# Patient Record
Sex: Male | Born: 1955
Health system: Southern US, Community
[De-identification: ages and names within clinical notes are randomized; demographics above are authoritative.]

## PROBLEM LIST (undated history)

## (undated) DIAGNOSIS — I2510 Atherosclerotic heart disease of native coronary artery without angina pectoris: Secondary | ICD-10-CM

## (undated) DIAGNOSIS — C61 Malignant neoplasm of prostate: Secondary | ICD-10-CM

## (undated) DIAGNOSIS — I10 Essential (primary) hypertension: Secondary | ICD-10-CM

## (undated) DIAGNOSIS — E119 Type 2 diabetes mellitus without complications: Secondary | ICD-10-CM

## (undated) DIAGNOSIS — M1990 Unspecified osteoarthritis, unspecified site: Secondary | ICD-10-CM

## (undated) DIAGNOSIS — C9210 Chronic myeloid leukemia, BCR/ABL-positive, not having achieved remission: Secondary | ICD-10-CM

## (undated) DIAGNOSIS — C801 Malignant (primary) neoplasm, unspecified: Secondary | ICD-10-CM

## (undated) DIAGNOSIS — Z7189 Other specified counseling: Secondary | ICD-10-CM

## (undated) DIAGNOSIS — E785 Hyperlipidemia, unspecified: Secondary | ICD-10-CM

## (undated) DIAGNOSIS — N329 Bladder disorder, unspecified: Secondary | ICD-10-CM

## (undated) DIAGNOSIS — K5790 Diverticulosis of intestine, part unspecified, without perforation or abscess without bleeding: Secondary | ICD-10-CM

## (undated) DIAGNOSIS — I219 Acute myocardial infarction, unspecified: Secondary | ICD-10-CM

## (undated) DIAGNOSIS — Z9889 Other specified postprocedural states: Secondary | ICD-10-CM

## (undated) DIAGNOSIS — R569 Unspecified convulsions: Secondary | ICD-10-CM

## (undated) HISTORY — PX: CARDIAC CATHETERIZATION: SHX172

## (undated) HISTORY — DX: Diverticulosis of intestine, part unspecified, without perforation or abscess without bleeding: K57.90

## (undated) HISTORY — DX: Other specified counseling: Z71.89

## (undated) HISTORY — PX: LIGAMENT REPAIR: SHX5444

## (undated) HISTORY — DX: Malignant neoplasm of prostate: C61

## (undated) HISTORY — DX: Hyperlipidemia, unspecified: E78.5

## (undated) HISTORY — DX: Atherosclerotic heart disease of native coronary artery without angina pectoris: I25.10

## (undated) HISTORY — DX: Chronic myeloid leukemia, BCR/ABL-positive, not having achieved remission: C92.10

## (undated) HISTORY — PX: FRACTURE SURGERY: SHX138

## (undated) HISTORY — PX: TONSILLECTOMY: SUR1361

## (undated) HISTORY — PX: TOE SURGERY: SHX1073

## (undated) HISTORY — DX: Other specified postprocedural states: Z98.890

## (undated) HISTORY — PX: FINGER TENDON REPAIR: SHX1640

## (undated) HISTORY — PX: WRIST SURGERY: SHX841

## (undated) HISTORY — PX: CAROTID STENT: SHX1301

---

## 1998-03-28 ENCOUNTER — Ambulatory Visit (HOSPITAL_COMMUNITY): Admission: RE | Admit: 1998-03-28 | Discharge: 1998-03-28 | Payer: Self-pay | Admitting: Pulmonary Disease

## 1998-03-28 ENCOUNTER — Encounter: Payer: Self-pay | Admitting: Pulmonary Disease

## 1998-04-01 ENCOUNTER — Encounter: Payer: Self-pay | Admitting: Pulmonary Disease

## 1998-04-01 ENCOUNTER — Ambulatory Visit (HOSPITAL_COMMUNITY): Admission: RE | Admit: 1998-04-01 | Discharge: 1998-04-01 | Payer: Self-pay | Admitting: Pulmonary Disease

## 1998-05-09 ENCOUNTER — Emergency Department (HOSPITAL_COMMUNITY): Admission: EM | Admit: 1998-05-09 | Discharge: 1998-05-09 | Payer: Self-pay | Admitting: Emergency Medicine

## 1998-05-09 ENCOUNTER — Encounter: Payer: Self-pay | Admitting: Emergency Medicine

## 1999-04-13 ENCOUNTER — Ambulatory Visit (HOSPITAL_COMMUNITY): Admission: RE | Admit: 1999-04-13 | Discharge: 1999-04-13 | Payer: Self-pay | Admitting: Pulmonary Disease

## 1999-04-13 ENCOUNTER — Encounter: Payer: Self-pay | Admitting: Pulmonary Disease

## 2000-06-06 ENCOUNTER — Encounter: Payer: Self-pay | Admitting: Cardiology

## 2000-06-06 ENCOUNTER — Encounter: Payer: Self-pay | Admitting: Emergency Medicine

## 2000-06-06 ENCOUNTER — Inpatient Hospital Stay (HOSPITAL_COMMUNITY): Admission: EM | Admit: 2000-06-06 | Discharge: 2000-06-11 | Payer: Self-pay | Admitting: Emergency Medicine

## 2000-06-10 ENCOUNTER — Encounter: Payer: Self-pay | Admitting: Cardiology

## 2000-06-18 ENCOUNTER — Encounter (HOSPITAL_COMMUNITY): Admission: RE | Admit: 2000-06-18 | Discharge: 2000-09-16 | Payer: Self-pay | Admitting: Cardiology

## 2000-07-02 ENCOUNTER — Inpatient Hospital Stay (HOSPITAL_COMMUNITY): Admission: EM | Admit: 2000-07-02 | Discharge: 2000-07-05 | Payer: Self-pay | Admitting: Emergency Medicine

## 2000-07-03 ENCOUNTER — Encounter: Payer: Self-pay | Admitting: Pulmonary Disease

## 2000-07-15 ENCOUNTER — Inpatient Hospital Stay (HOSPITAL_COMMUNITY): Admission: EM | Admit: 2000-07-15 | Discharge: 2000-07-18 | Payer: Self-pay | Admitting: Emergency Medicine

## 2000-07-16 ENCOUNTER — Encounter: Payer: Self-pay | Admitting: Cardiology

## 2000-08-17 ENCOUNTER — Emergency Department (HOSPITAL_COMMUNITY): Admission: EM | Admit: 2000-08-17 | Discharge: 2000-08-17 | Payer: Self-pay | Admitting: Emergency Medicine

## 2000-09-17 ENCOUNTER — Encounter (HOSPITAL_COMMUNITY): Admission: RE | Admit: 2000-09-17 | Discharge: 2000-12-16 | Payer: Self-pay | Admitting: Cardiology

## 2001-01-10 ENCOUNTER — Encounter: Payer: Self-pay | Admitting: Cardiology

## 2001-01-10 ENCOUNTER — Ambulatory Visit (HOSPITAL_COMMUNITY): Admission: RE | Admit: 2001-01-10 | Discharge: 2001-01-10 | Payer: Self-pay | Admitting: Cardiology

## 2001-01-16 ENCOUNTER — Ambulatory Visit (HOSPITAL_COMMUNITY): Admission: RE | Admit: 2001-01-16 | Discharge: 2001-01-17 | Payer: Self-pay | Admitting: Cardiology

## 2001-01-22 ENCOUNTER — Inpatient Hospital Stay (HOSPITAL_COMMUNITY): Admission: EM | Admit: 2001-01-22 | Discharge: 2001-01-24 | Payer: Self-pay | Admitting: Emergency Medicine

## 2001-01-23 ENCOUNTER — Encounter: Payer: Self-pay | Admitting: Cardiology

## 2001-12-31 ENCOUNTER — Ambulatory Visit (HOSPITAL_COMMUNITY): Admission: RE | Admit: 2001-12-31 | Discharge: 2001-12-31 | Payer: Self-pay | Admitting: Cardiology

## 2001-12-31 ENCOUNTER — Encounter: Payer: Self-pay | Admitting: Cardiology

## 2002-09-30 ENCOUNTER — Ambulatory Visit (HOSPITAL_COMMUNITY): Admission: RE | Admit: 2002-09-30 | Discharge: 2002-09-30 | Payer: Self-pay | Admitting: Cardiology

## 2002-09-30 ENCOUNTER — Encounter: Payer: Self-pay | Admitting: Cardiology

## 2002-10-06 ENCOUNTER — Encounter: Payer: Self-pay | Admitting: Cardiology

## 2002-10-06 ENCOUNTER — Ambulatory Visit (HOSPITAL_COMMUNITY): Admission: RE | Admit: 2002-10-06 | Discharge: 2002-10-07 | Payer: Self-pay | Admitting: Cardiology

## 2003-02-19 ENCOUNTER — Ambulatory Visit (HOSPITAL_BASED_OUTPATIENT_CLINIC_OR_DEPARTMENT_OTHER): Admission: RE | Admit: 2003-02-19 | Discharge: 2003-02-19 | Payer: Self-pay | Admitting: Otolaryngology

## 2003-05-14 ENCOUNTER — Ambulatory Visit (HOSPITAL_BASED_OUTPATIENT_CLINIC_OR_DEPARTMENT_OTHER): Admission: RE | Admit: 2003-05-14 | Discharge: 2003-05-14 | Payer: Self-pay | Admitting: Pulmonary Disease

## 2003-10-13 ENCOUNTER — Emergency Department (HOSPITAL_COMMUNITY): Admission: EM | Admit: 2003-10-13 | Discharge: 2003-10-13 | Payer: Self-pay | Admitting: Emergency Medicine

## 2003-10-20 ENCOUNTER — Ambulatory Visit (HOSPITAL_COMMUNITY): Admission: RE | Admit: 2003-10-20 | Discharge: 2003-10-20 | Payer: Self-pay | Admitting: Internal Medicine

## 2004-08-14 ENCOUNTER — Ambulatory Visit (HOSPITAL_COMMUNITY): Admission: RE | Admit: 2004-08-14 | Discharge: 2004-08-15 | Payer: Self-pay | Admitting: Cardiology

## 2004-09-21 ENCOUNTER — Inpatient Hospital Stay (HOSPITAL_COMMUNITY): Admission: EM | Admit: 2004-09-21 | Discharge: 2004-09-26 | Payer: Self-pay | Admitting: Emergency Medicine

## 2004-12-21 ENCOUNTER — Ambulatory Visit (HOSPITAL_COMMUNITY): Admission: RE | Admit: 2004-12-21 | Discharge: 2004-12-21 | Payer: Self-pay | Admitting: Urology

## 2005-01-01 ENCOUNTER — Ambulatory Visit (HOSPITAL_COMMUNITY): Admission: RE | Admit: 2005-01-01 | Discharge: 2005-01-01 | Payer: Self-pay | Admitting: Urology

## 2005-02-28 ENCOUNTER — Inpatient Hospital Stay (HOSPITAL_COMMUNITY): Admission: RE | Admit: 2005-02-28 | Discharge: 2005-03-02 | Payer: Self-pay | Admitting: Urology

## 2005-02-28 ENCOUNTER — Encounter (INDEPENDENT_AMBULATORY_CARE_PROVIDER_SITE_OTHER): Payer: Self-pay | Admitting: Specialist

## 2007-02-24 ENCOUNTER — Ambulatory Visit (HOSPITAL_BASED_OUTPATIENT_CLINIC_OR_DEPARTMENT_OTHER): Admission: RE | Admit: 2007-02-24 | Discharge: 2007-02-24 | Payer: Self-pay | Admitting: Orthopedic Surgery

## 2007-09-09 ENCOUNTER — Observation Stay (HOSPITAL_COMMUNITY): Admission: EM | Admit: 2007-09-09 | Discharge: 2007-09-09 | Payer: Self-pay | Admitting: Emergency Medicine

## 2007-09-19 ENCOUNTER — Ambulatory Visit (HOSPITAL_COMMUNITY): Admission: RE | Admit: 2007-09-19 | Discharge: 2007-09-19 | Payer: Self-pay | Admitting: Cardiology

## 2007-12-03 ENCOUNTER — Encounter: Admission: RE | Admit: 2007-12-03 | Discharge: 2007-12-03 | Payer: Self-pay | Admitting: Gastroenterology

## 2008-03-03 ENCOUNTER — Ambulatory Visit (HOSPITAL_COMMUNITY): Admission: RE | Admit: 2008-03-03 | Discharge: 2008-03-03 | Payer: Self-pay | Admitting: Surgery

## 2008-07-14 ENCOUNTER — Inpatient Hospital Stay (HOSPITAL_COMMUNITY): Admission: EM | Admit: 2008-07-14 | Discharge: 2008-07-15 | Payer: Self-pay | Admitting: Emergency Medicine

## 2008-07-17 ENCOUNTER — Emergency Department (HOSPITAL_COMMUNITY): Admission: EM | Admit: 2008-07-17 | Discharge: 2008-07-17 | Payer: Self-pay | Admitting: Family Medicine

## 2008-08-25 ENCOUNTER — Inpatient Hospital Stay (HOSPITAL_COMMUNITY): Admission: EM | Admit: 2008-08-25 | Discharge: 2008-08-27 | Payer: Self-pay | Admitting: Emergency Medicine

## 2010-01-24 ENCOUNTER — Emergency Department (HOSPITAL_COMMUNITY): Admission: EM | Admit: 2010-01-24 | Discharge: 2010-01-24 | Payer: Self-pay | Admitting: Family Medicine

## 2010-04-10 DIAGNOSIS — I219 Acute myocardial infarction, unspecified: Secondary | ICD-10-CM

## 2010-04-10 HISTORY — DX: Acute myocardial infarction, unspecified: I21.9

## 2010-04-25 ENCOUNTER — Encounter (HOSPITAL_COMMUNITY): Payer: 59 | Attending: Internal Medicine

## 2010-04-25 DIAGNOSIS — M899 Disorder of bone, unspecified: Secondary | ICD-10-CM | POA: Insufficient documentation

## 2010-06-12 LAB — URINALYSIS, ROUTINE W REFLEX MICROSCOPIC
Glucose, UA: NEGATIVE mg/dL
Ketones, ur: 15 mg/dL — AB
Nitrite: NEGATIVE
Protein, ur: 30 mg/dL — AB
Specific Gravity, Urine: 1.022 (ref 1.005–1.030)
Urobilinogen, UA: 0.2 mg/dL (ref 0.0–1.0)
pH: 5.5 (ref 5.0–8.0)

## 2010-06-12 LAB — DIFFERENTIAL
Basophils Absolute: 0 10*3/uL (ref 0.0–0.1)
Basophils Relative: 0 % (ref 0–1)
Eosinophils Absolute: 0 10*3/uL (ref 0.0–0.7)
Eosinophils Relative: 0 % (ref 0–5)
Lymphocytes Relative: 9 % — ABNORMAL LOW (ref 12–46)
Lymphs Abs: 1.5 10*3/uL (ref 0.7–4.0)
Monocytes Absolute: 1.9 10*3/uL — ABNORMAL HIGH (ref 0.1–1.0)
Monocytes Relative: 11 % (ref 3–12)
Neutro Abs: 13.5 10*3/uL — ABNORMAL HIGH (ref 1.7–7.7)
Neutrophils Relative %: 80 % — ABNORMAL HIGH (ref 43–77)

## 2010-06-12 LAB — COMPREHENSIVE METABOLIC PANEL
ALT: 16 U/L (ref 0–53)
Alkaline Phosphatase: 57 U/L (ref 39–117)
BUN: 8 mg/dL (ref 6–23)
CO2: 29 mEq/L (ref 19–32)
Chloride: 103 mEq/L (ref 96–112)
Glucose, Bld: 113 mg/dL — ABNORMAL HIGH (ref 70–99)
Potassium: 4 mEq/L (ref 3.5–5.1)
Sodium: 137 mEq/L (ref 135–145)
Total Bilirubin: 0.5 mg/dL (ref 0.3–1.2)
Total Protein: 6.1 g/dL (ref 6.0–8.3)

## 2010-06-12 LAB — CULTURE, BLOOD (ROUTINE X 2)
Culture: NO GROWTH
Culture: NO GROWTH

## 2010-06-12 LAB — CBC
HCT: 38 % — ABNORMAL LOW (ref 39.0–52.0)
HCT: 38.1 % — ABNORMAL LOW (ref 39.0–52.0)
HCT: 43.6 % (ref 39.0–52.0)
Hemoglobin: 12.9 g/dL — ABNORMAL LOW (ref 13.0–17.0)
Hemoglobin: 14.7 g/dL (ref 13.0–17.0)
MCHC: 33.8 g/dL (ref 30.0–36.0)
MCV: 94.9 fL (ref 78.0–100.0)
Platelets: 196 10*3/uL (ref 150–400)
Platelets: 224 10*3/uL (ref 150–400)
RBC: 3.99 MIL/uL — ABNORMAL LOW (ref 4.22–5.81)
RBC: 4.6 MIL/uL (ref 4.22–5.81)
RDW: 13.9 % (ref 11.5–15.5)
RDW: 14.1 % (ref 11.5–15.5)
RDW: 14.5 % (ref 11.5–15.5)
WBC: 13.3 10*3/uL — ABNORMAL HIGH (ref 4.0–10.5)
WBC: 16.9 10*3/uL — ABNORMAL HIGH (ref 4.0–10.5)
WBC: 9.1 10*3/uL (ref 4.0–10.5)

## 2010-06-12 LAB — URINE CULTURE
Colony Count: NO GROWTH
Culture: NO GROWTH

## 2010-06-12 LAB — LACTIC ACID, PLASMA: Lactic Acid, Venous: 1.1 mmol/L (ref 0.5–2.2)

## 2010-06-12 LAB — BASIC METABOLIC PANEL
BUN: 11 mg/dL (ref 6–23)
BUN: 12 mg/dL (ref 6–23)
CO2: 25 mEq/L (ref 19–32)
Calcium: 8.4 mg/dL (ref 8.4–10.5)
Calcium: 9 mg/dL (ref 8.4–10.5)
Chloride: 102 mEq/L (ref 96–112)
Creatinine, Ser: 1.14 mg/dL (ref 0.4–1.5)
Creatinine, Ser: 1.25 mg/dL (ref 0.4–1.5)
GFR calc Af Amer: >60 mL/min (ref >60)
GFR calc non Af Amer: >60 mL/min (ref >60)
GFR calc non Af Amer: >60 mL/min (ref >60)
Glucose, Bld: 118 mg/dL — ABNORMAL HIGH (ref 70–99)
Glucose, Bld: 141 mg/dL — ABNORMAL HIGH (ref 70–99)
Potassium: 3.7 mEq/L (ref 3.5–5.1)
Potassium: 3.8 mEq/L (ref 3.5–5.1)
Sodium: 138 mEq/L (ref 135–145)

## 2010-06-12 LAB — HEPATIC FUNCTION PANEL
ALT: 19 U/L (ref 0–53)
AST: 22 U/L (ref 0–37)
Albumin: 3.7 g/dL (ref 3.5–5.2)
Alkaline Phosphatase: 68 U/L (ref 39–117)
Bilirubin, Direct: 0.2 mg/dL (ref 0.0–0.3)
Indirect Bilirubin: 0.6 mg/dL (ref 0.3–0.9)
Total Bilirubin: 0.8 mg/dL (ref 0.3–1.2)
Total Protein: 7.5 g/dL (ref 6.0–8.3)

## 2010-06-12 LAB — URINE MICROSCOPIC-ADD ON

## 2010-06-12 LAB — LIPASE, BLOOD
Lipase: 12 U/L (ref 11–59)
Lipase: 13 U/L (ref 11–59)

## 2010-06-13 LAB — CK TOTAL AND CKMB (NOT AT ARMC)
CK, MB: 1.5 ng/mL (ref 0.3–4.0)
Relative Index: INVALID (ref 0.0–2.5)
Total CK: 91 U/L (ref 7–232)

## 2010-06-13 LAB — COMPREHENSIVE METABOLIC PANEL
BUN: 10 mg/dL (ref 6–23)
CO2: 27 mEq/L (ref 19–32)
Calcium: 8.8 mg/dL (ref 8.4–10.5)
Chloride: 107 mEq/L (ref 96–112)
Creatinine, Ser: 1.03 mg/dL (ref 0.4–1.5)
GFR calc non Af Amer: >60 mL/min (ref >60)
Total Bilirubin: 0.5 mg/dL (ref 0.3–1.2)

## 2010-06-13 LAB — LIPID PANEL
Cholesterol: 125 mg/dL (ref 0–200)
HDL: 29 mg/dL — ABNORMAL LOW (ref >39)
Total CHOL/HDL Ratio: 4.3 RATIO
VLDL: 7 mg/dL (ref 0–40)

## 2010-06-13 LAB — CBC
MCHC: 34.2 g/dL (ref 30.0–36.0)
MCHC: 34.7 g/dL (ref 30.0–36.0)
MCV: 94.4 fL (ref 78.0–100.0)
Platelets: 251 10*3/uL (ref 150–400)
RBC: 4.57 MIL/uL (ref 4.22–5.81)
RBC: 4.8 MIL/uL (ref 4.22–5.81)
WBC: 7.5 10*3/uL (ref 4.0–10.5)
WBC: 8.3 10*3/uL (ref 4.0–10.5)

## 2010-06-13 LAB — BASIC METABOLIC PANEL
BUN: 11 mg/dL (ref 6–23)
CO2: 24 mEq/L (ref 19–32)
Calcium: 8.8 mg/dL (ref 8.4–10.5)
Chloride: 106 mEq/L (ref 96–112)
Creatinine, Ser: 1.07 mg/dL (ref 0.4–1.5)
GFR calc Af Amer: >60 mL/min (ref >60)

## 2010-06-13 LAB — TSH: TSH: 2.017 u[IU]/mL (ref 0.350–4.500)

## 2010-06-13 LAB — TROPONIN I: Troponin I: 0.01 ng/mL (ref 0.00–0.06)

## 2010-06-13 LAB — POCT CARDIAC MARKERS: Myoglobin, poc: 58.3 ng/mL (ref 12–200)

## 2010-06-13 LAB — HEMOGLOBIN A1C: Hgb A1c MFr Bld: 5.7 % (ref 4.6–6.1)

## 2010-06-13 LAB — HEPARIN LEVEL (UNFRACTIONATED)
Heparin Unfractionated: 0.81 IU/mL — ABNORMAL HIGH (ref 0.30–0.70)
Heparin Unfractionated: 0.86 IU/mL — ABNORMAL HIGH (ref 0.30–0.70)

## 2010-06-13 LAB — CARDIAC PANEL(CRET KIN+CKTOT+MB+TROPI): Troponin I: 0.01 ng/mL (ref 0.00–0.06)

## 2010-06-13 LAB — BRAIN NATRIURETIC PEPTIDE: Pro B Natriuretic peptide (BNP): <30 pg/mL (ref 0.0–100.0)

## 2010-07-18 NOTE — H&P (Signed)
NAMEEURAL, HOLZSCHUH             ACCOUNT NO.:  [id number]   MEDICAL RECORD NO.:  [id number]          PATIENT TYPE:  INP   LOCATION:  5127                         FACILITY:  [facility]   PHYSICIAN:  Della Goo, M.D. DATE OF BIRTH:  06/08/1955   DATE OF ADMISSION:  08/24/2008  DATE OF DISCHARGE:                              HISTORY & PHYSICAL   PRIMARY CARE PHYSICIAN:  Dr. Dorothyann Peng.   CHIEF COMPLAINT:  Bloody urine.   HISTORY OF PRESENT ILLNESS:  This is a 55 year old male who presents to  the emergency department with complaints of passing blood in his urine  for the past 4 days along with pain that he describes in his penis along  with difficulty urinating and increased urinary frequency.  The patient  reports having fevers and chills over the past 4 days as well.  He  reports having nausea, denies having vomiting.  Denies having any  diarrhea.  The patient was seen by his primary care physician on June 22  and placed on Levaquin therapy.  The patient was seen in the emergency  department, evaluated, and he was found to have an elevated white blood  cell count of 16.9 with 80% neutrophils, and a urinalysis revealed  moderate leukocyte esterase, moderate hemoglobin and protein.  A CT scan  of the abdomen and pelvis was also performed, which revealed  inflammatory changes of the right kidney.  Increased right perinephric  stranding was seen, also stranding about the head of the pancreas was  also seen.  The patient was started on antibiotic therapy of Rocephin in  the emergency department.   PAST MEDICAL HISTORY:  1. Significant for hypertension.  2. History of prostate cancer, status post TURP in 2006.  3. The patient also has a history of coronary artery disease, status      post PTCA with stents x2.  4. Gastroesophageal reflux disease.   MEDICATIONS:  Include Diovan, aspirin, Enablex, fish oil, hydroxyzine,  Levaquin, Nexium, Niaspan, promethazine, Tetrix, Toprol XL,  vitamin D  and Vytorin.   ALLERGIES:  To IV CONTRAST DYE.   SOCIAL HISTORY:  The patient is a nonsmoker.  He reports rare alcohol  usage.   FAMILY HISTORY:  Positive for diabetes in his mother.  Positive for  coronary artery disease in his paternal grandfather.  Positive for  hypertension in both parents.  Positive for cancer in his paternal  grandfather, who had prostate cancer.   REVIEW OF SYSTEMS:  Pertinents are mentioned above.   PHYSICAL EXAMINATION FINDINGS:  This is a 55 year old well-nourished,  well-developed male who is in no visible discomfort or acute distress.  VITAL SIGNS:  Temperature 99.8, blood pressure 117/83, heart rate 67,  respirations 18.  O2 saturations 97-98%.  HEENT EXAMINATION:  Normocephalic, atraumatic.  There is no scleral  icterus.  Pupils equally round, reactive to light.  Extraocular  movements are intact.  Funduscopic benign.  Nares are patent  bilaterally.  Oropharynx is clear.  NECK:  Supple, full range of motion.  No thyromegaly, adenopathy,  jugular venous distention.  CARDIOVASCULAR:  Regular rate and rhythm.  No  murmurs, gallops or rubs.  LUNGS:  Clear to auscultation bilaterally.  ABDOMEN:  Positive bowel sounds, soft, nontender, nondistended.  No  hepatosplenomegaly.  No costovertebral angle tenderness.  No spinous process tenderness.  NEUROLOGIC EXAMINATION:  The patient is alert and oriented x3.  Cranial  nerves are intact.  Motor and sensory function intact.   LABORATORY STUDIES:  White blood cell count 16.9, hemoglobin 41.7,  hematocrit 43.6, platelets 224, neutrophils 80%, lymphocytes 9%.  Sodium  138, potassium 3.8, chloride 102, carbon dioxide 25, BUN 12, creatinine  1.25, and glucose 118.  Lactic acid level 1.1.  Urinalysis:  Moderate  hemoglobin, moderate leukocyte esterase, 30 mg/dL of protein.  Urine  microscopic:  White blood cells 11-20, urine red blood cell 0-2, rare  bacteria.  Hepatic function panel:  Albumin 3.7, AST  22, ALT 19,  alkaline phosphatase 68, total bilirubin 0.8.  Lipase level 12.  CT scan  of the abdomen and pelvis as mentioned above.   ASSESSMENT:  A 56 year old male being admitted with:  1. Right-sided pyelonephritis.  2. Abdominal pain.  3. Hypertension history.  4. Coronary artery disease history.  5. Prostate cancer history.   PLAN:  The patient will be admitted and placed on IV antibiotic therapy  of Rocephin.  Ciprofloxacin has also been ordered.  The patient has also  been placed on IV fluids for rehydration and maintenance therapy.  Pain  control therapy has been ordered as needed and antipyretic therapy has  also been ordered.  The patient's regular medications will be further  verified.  DVT and GI prophylaxis have been ordered.  Further workup  will ensue pending results of the patient's clinical course.      Della Goo, M.D.  Electronically Signed     HJ/MEDQ  D:  08/25/2008  T:  08/25/2008  Job:  [id number]   cc:   Candyce Churn. Allyne Gee, M.D.

## 2010-07-18 NOTE — Discharge Summary (Signed)
[name], [name]             ACCOUNT NO.:  [id number]   MEDICAL RECORD NO.:  [id number]          PATIENT TYPE:  INP   LOCATION:  2903                         FACILITY:  [facility]   PHYSICIAN:  Ritta Slot, MD     DATE OF BIRTH:  December 11, 1955   DATE OF ADMISSION:  07/14/2008  DATE OF DISCHARGE:  07/15/2008                               DISCHARGE SUMMARY   ADDENDUM   DISCHARGE MEDICATIONS:  1. Vytorin 10/80 every day.  2. Nexium 40 mg b.i.d.  3. Toprol-XL 50 mg every day.  4. Diovan 80 mg every day.  5. Hydroxyzine 25 mg b.i.d.  6. Cialis p.r.n.  7. Aspirin 81 mg b.i.d.  8. Enablex 15 mg every day.  9. Niacin 500 mg every day.  10.Fish oil 1200 mg every.  11.Vitamin D every day.   He will follow up with Dr. Lynnea Ferrier on Jul 30, 2008, at 9:45.      Lezlie Octave, N.P.      Ritta Slot, MD  Electronically Signed    BB/MEDQ  D:  07/15/2008  T:  07/16/2008  Job:  [id number]

## 2010-07-18 NOTE — Op Note (Signed)
NAMEMAXIMILLIAN, HABIBI             ACCOUNT NO.:  [id number]   MEDICAL RECORD NO.:  [id number]          PATIENT TYPE:  AMB   LOCATION:  DSC                          FACILITY:  [facility]   PHYSICIAN:  Feliberto Gottron. Turner Daniels, M.D.   DATE OF BIRTH:  23-Jul-1955   DATE OF PROCEDURE:  02/24/2007  DATE OF DISCHARGE:                               OPERATIVE REPORT   PREOPERATIVE DIAGNOSIS:  Refractory right de Quervain's  stenosing  tenosynovitis.   POSTOPERATIVE DIAGNOSIS:  Refractory right de Quervain's  stenosing  tenosynovitis.   PROCEDURE:  Open right de Quervain's  release.   SURGEON:  Feliberto Gottron.  Turner Daniels, MD.   FIRST ASSISTANT:  None.   ANESTHETIC:  Local with IV sedation.   ESTIMATED BLOOD LOSS:  Minimal.   FLUID REPLACEMENT:  500 mL crystalloid.   TOURNIQUET TIME:  7 minutes.   INDICATIONS FOR PROCEDURE:  A 55 year old gentleman with a history of  sleep apnea and stable heart disease with de Quervain's stenosing  tenosynovitis that has been refractory to splinting, got good temporary  relief with a couple cortisone injections but now has recurrent de  Quervain's and desires open de Quervain's release. The risks and  benefits of surgery were discussed, questions answered.   DESCRIPTION OF PROCEDURE:  The patient identified by armband, taken to  the operating room at [facility], appropriate anesthetic  monitors were attached and IV sedation was induced. Prior to prepping, I  went ahead and sterilely cleansed the skin with alcohol and applied  local anesthetic using 5 mL of a 50/50 mixture of 2% Xylocaine and 0.5%  Marcaine. A tourniquet was then applied to the right upper extremity  which was then prepped and draped in the usual sterile fashion from the  fingertips to the tourniquet.  The limb was then wrapped in Esmarch  bandage, tourniquet inflated to 300 mmHg, began the procedure by making  a 2 cm incision over the first dorsal compartment just through the skin  and of the  subcutaneous tissue.  We then spread it with tenotomy  scissors down to the sheath over the tendons which was incised with a 15  blade, removing a rectangular section of the tendon sheath over the  abductor pollicis longus and extensor pollicis brevis.  This freed up  the tendons completely, they were thoroughly probed to make sure there  were no ganglia.  The wound was then irrigated out with normal saline  solution, the tourniquet was let down, small bleeders were cauterized  with the bipolar. A single layer closure using  4-0 Monocryl suture was then accomplished with a single Steri-Strip and  a dressing of Xeroform 4x4 dressing sponges, Webril and an Ace wrap  applied.  The patient was then awakened and taken to the recovery room  without difficulty.      Feliberto Gottron. Turner Daniels, M.D.  Electronically Signed     FJR/MEDQ  D:  02/24/2007  T:  02/25/2007  Job:  [id number]

## 2010-07-18 NOTE — Discharge Summary (Signed)
NAMEQUINNTON, BURY             ACCOUNT NO.:  [id number]   MEDICAL RECORD NO.:  [id number]          PATIENT TYPE:  INP   LOCATION:  2903                         FACILITY:  [facility]   PHYSICIAN:  Ritta Slot, MD     DATE OF BIRTH:  07-Mar-1955   DATE OF ADMISSION:  07/14/2008  DATE OF DISCHARGE:  07/15/2008                               DISCHARGE SUMMARY   DISCHARGE DIAGNOSES:  1. Chest pain, nonischemic, probable musculoskeletal.  2. Left arm and neck discomfort prior to chest pain.  3. History of coronary artery disease with history of stent to his      left circumflex and stent to his mid right coronary artery, cardiac      catheterization this admission with both stents being patent.  4. Hypertension.  5. Hyperlipidemia.  6. Prostate cancer with history of prostatectomy.   PROCEDURE:  His cardiac catheterization by Dr. Ritta Slot on Jul 15, 2008, showing patent stents, normal LV function.   LABORATORY DATA:  CK-MB and troponins were negative.  Hemoglobin 15.6,  hematocrit 45.5, WBC 7.5, and platelets 253.  Total cholesterol 125,  triglycerides 35, HDL 29, and LDL is 89.  CK-MB and troponins were all  negative.  TSH 2.017, hemoglobin A1c was 5.7.  BNP was less than 30,  magnesium was 2.2.  Chest x-ray showed no active disease.   HOSPITAL COURSE:  Mr. Frangos came into hospital with chest discomfort.  He stated that for the last several days he had been having neck and  left arm pain and then today it came around in his chest and moved over  to the right side.  He said that he considered that this was his heart-  related pain.  He had coronary disease before with 2 stents.  It was  decided he should be admitted, ruled out for an MI, and undergo cardiac  catheterization.  He did have new EKG, lateral T-wave inversions.  The  following day, Jul 15, 2008, he underwent cardiac catheterization.  His  stents were patent.  The procedure was performed by Dr. Ritta Slot.  He  will follow up with Dr. Dr. Lynnea Ferrier in Dr. Truett Perna absence.  His  Toprol was increased at 50 mg every day.      Lezlie Octave, N.P.      Ritta Slot, MD  Electronically Signed    BB/MEDQ  D:  07/15/2008  T:  07/16/2008  Job:  [id number]   cc:   Candyce Churn. Allyne Gee, M.D.

## 2010-07-18 NOTE — Op Note (Signed)
NAME:  [name], [name]             ACCOUNT NO.:  [id number]   MEDICAL RECORD NO.:  [id number]          PATIENT TYPE:  AMB   LOCATION:  SDS                          FACILITY:  [facility]   PHYSICIAN:  Wilmon Arms. Corliss Skains, M.D. DATE OF BIRTH:  January 19, 1956   DATE OF PROCEDURE:  03/03/2008  DATE OF DISCHARGE:  03/03/2008                               OPERATIVE REPORT   PREOPERATIVE DIAGNOSIS:  Right inguinal hernia.   POSTOPERATIVE DIAGNOSIS:  Right inguinal hernia.   PROCEDURE PERFORMED:  Right inguinal hernia repair with mesh.   SURGEON:  Wilmon Arms. Corliss Skains, MD   ANESTHESIA:  General.   INDICATIONS:  The patient is a 55 year old male who presents with the  right groin pain radiating down into his scrotum.  On examination, he  was noted to have an easily palpable right inguinal hernia which was  reducible.  There is no sign of left inguinal hernia.  He presents now  for elective hernia repair.   DESCRIPTION OF PROCEDURE:  The patient was brought to the operating room  and placed in the supine position on the operating room table.  After an  adequate level of general anesthesia was obtained, the patient's right  groin was shaved, prepped with Betadine, and draped in the sterile  fashion.  A time-out was taken to assure the proper patient and proper  procedure.  An incision was made above the right inguinal ligament.  Dissection was carried down through the subcutaneous tissues to the  external oblique fascia.  The fascia was overlying in the direction of  its fibers to the external ring.  We bluntly dissected around the  spermatic cord.  The patient had a very large direct hernia defect.  We  dissected the hernia sac free from the surrounding tissue and reduced  this back in the preperitoneal space.  This was held in place with a  sponge stick.  The floor of the inguinal canal was then imbricated with  a 0 PDS running suture from the pubic tubercle out to the internal ring.  The sponge stick  was removed.  The spermatic cord was skeletonized and a  small indirect cord lipoma was dissected free and reduced.  A 3 x 6  Ultrapro mesh was cut in the keyhole shape and was secured beginning at  the pubic tubercle with 2-0 Prolene.  This was run along the internal  oblique fascia superiorly and the shelving edge inferiorly.  The tails  were sutured together behind the spermatic cord and tucked underneath  the external oblique fascia.  The fascia was reapproximated with 2-0  Vicryl.  The 3-0 Vicryl was used to close the subcutaneous tissues.  The  4-0 Monocryl was used to close skin.  Steri-Strips and clean dressings  were applied.  The patient was then extubated and brought to recovery  room in stable condition.  All sponge, instrument, and needle counts  were correct.      Wilmon Arms. Tsuei, M.D.  Electronically Signed     MKT/MEDQ  D:  03/03/2008  T:  03/04/2008  Job:  [id number]

## 2010-07-18 NOTE — Discharge Summary (Signed)
NAMECECILIO, OHLRICH             ACCOUNT NO.:  [id number]   MEDICAL RECORD NO.:  [id number]          PATIENT TYPE:  INP   LOCATION:  5157                         FACILITY:  [facility]   PHYSICIAN:  Monte Fantasia, MD  DATE OF BIRTH:  May 21, 1955   DATE OF ADMISSION:  08/24/2008  DATE OF DISCHARGE:  08/27/2008                               DISCHARGE SUMMARY   PRIMARY CARE PHYSICIAN:  Robyn N. Allyne Gee, MD   DISCHARGE DIAGNOSES:  1. Pyelonephritis.  2. Hypertension.  3. Coronary artery disease.  4. History of prostate cancer.   MEDICATIONS UPON DISCHARGE:  1. Levaquin 500 mg p.o. daily for 11 days.  2. Nexium 2 tablets p.o. daily 40 mg each.  3. Hydroxyzine 25 mg p.o. b.i.d.  4. Diovan 80 mg p.o. daily.  5. Toprol-XL 50 mg p.o. daily.  6. Aspirin 81 mg p.o. daily.  7. Vytorin 10/80 one tablet p.o. daily.  8. Enablex 15 mg p.o. daily.  9. Fish oil 1200 mg p.o. daily.  10.Vitamin D3 p.o. q.i.d.   COURSE DURING THE HOSPITAL STAY:  Mr. Chewning is a 55 year old African  American male patient, who was admitted on August 25, 2008, with  complaints of back pain with passing blood in his urine.  The pain in  the back was radiating down to his groin and had difficulty urinating  with increased frequency.  He also reported fevers and chills for past 4  days.  The patient was just prescribed Levaquin by his primary care  physician, however, came in because of his severe back pain to the ER.  The patient was, on admission, started on Rocephin and ciprofloxacin on  admission.  Blood and urine culture was sent on admission prior to  starting the antibiotics.  The patient did have on admission,  leukocytosis of 16.9.   For the past 48 hours, the patient has improved well through his stay in  the hospital, has been afebrile through the stay.  Urinary symptoms have  resolved and the back pain has resolved.  The patient also had CT scan  of the abdomen and pelvis on admission, which showed no  hydronephrosis,  tiny calculi in the collecting right kidney, inflammatory changes  involving the right kidney and pyelonephritis cannot be excluded.  CT  pelvis and bladder was unremarkable and prostate was not visualized.  No  intrapelvic pathology was noted.  At present, blood cultures and urine  cultures have been no growth to date, and the patient has been afebrile  through the stay.  Leukocyte has resolved to 9.0.  At present, the  patient is medically stable to be discharged and on Levaquin for 11 more  days to complete the course of 2 weeks.   RADIOLOGICAL INVESTIGATIONS DONE DURING THE STAY IN THE HOSPITAL:  CT  abdomen without contrast.  Impression:  Early pancreatitis inflammatory  changes involving the right kidney, pyelonephritis cannot be excluded.  CT pelvis.  Impression:  No acute intrapelvic pathology.   LABORATORIES DONE DURING THE STAY IN THE HOSPITAL:  Total WBC 9.1  improved from 16.9, hemoglobin 12.9, hematocrit 38.1, platelets of 189.  Sodium 137, potassium 4.0, chloride 103, bicarb 29, glucose 113, BUN 8,  creatinine 1.08.  Total bilirubin 0.5, alkaline phosphatase 57, AST 18,  ALT 16.  Total protein 6.1, albumin 2.8, calcium of 8.9, lipase 13,  lactic acid 1.1.  UA:  Nitrite negative, leukocytes moderate, wbc's 11-  20, bacteria rare.  Blood cultures x2 sets have been negative to no  growth to date.  Urine cultures have been no growth both done on August 24, 2008.   DISPOSITION:  The patient at present is medically stable to be  discharged and can be discharged home.  He has recommended to take  Levaquin to complete a course of 14 days for 11 more days.  The patient  has also recommended to follow up with his primary care physician, Dr.  Allyne Gee.   TOTAL TIME FOR DICTATION:  35 minutes.      Monte Fantasia, MD  Electronically Signed     MP/MEDQ  D:  08/27/2008  T:  08/28/2008  Job:  [id number]

## 2010-07-21 NOTE — Discharge Summary (Signed)
NAMECEPHUS, [name]             ACCOUNT NO.:  [id number]   MEDICAL RECORD NO.:  [id number]          PATIENT TYPE:  OIB   LOCATION:  6522                         FACILITY:  [facility]   PHYSICIAN:  Richard A. Alanda Amass, M.D.DATE OF BIRTH:  [birth year]   DATE OF ADMISSION:  08/14/2004  DATE OF DISCHARGE:  08/15/2004                                 DISCHARGE SUMMARY   DISCHARGE DIAGNOSES:  1.  Chest pain consistent with angina.  2.  Coronary disease, right coronary artery TAXUS stenting, this admission.  3.  History of prior circumflex intervention in 2002.  4.  Hypertension.  5.  Dyslipidemia, Vytorin was increased this admission.  6.  History of intravenous dye allergy.  7.  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   HOSPITAL COURSE:  Mr. [name] is a 55 year old male followed by Dr. Dorothyann Peng and Dr. Elsie Lincoln with a history of coronary disease.  He had prior  circumflex OM stenting.  He was catheterized in June of 2005 and this site  was patent.  He was seen in the office on Jul 27, 2004.  He says that he had  developed some dyspnea and exertional chest pain.  It was worrisome for  angina.  The patient has a history of false-positive Cardiolite studies in  the past.  Dr. Elsie Lincoln [facility] and this revealed a 75% RCA stenosis.  The  previous OM stent was patent and the LAD was patent.  His EF was 50%.  He  was started on Plavix and set up for an elective intervention.  The patient  was admitted August 14, 2004 for elective RCA intervention; this was done by  Dr. Elsie Lincoln.  The patient received a TAXUS stent.  He tolerated this well.  We feel he can be discharged August 15, 2004.  He had been on chronic  diclofenac b.i.d. for over a month.  Dr. Alanda Amass suggested he stop this  and take it on a p.r.n. basis.  We did increase his Vytorin to 10/80, as his  LDL was over 100.  He will follow up with Dr. Elsie Lincoln as an outpatient.   DISCHARGE  MEDICATIONS:  1.  Diovan 80 mg a day.  2.  Protonix 40 mg a day.  3.  Vytorin 10/80 daily.  4.  Aspirin 81 mg a day.  5.  Plavix 75 mg a day.  6.  Toprol-XL 25 mg a day.  7.  Hydroxyzine 25 mg twice a day.  8.  Nitroglycerin sublingual p.r.n.   LABORATORY DATA:  White count 15.4, hemoglobin 14.8, hematocrit 43.2,  platelets 338,000.  Sodium 139, potassium 4.0, BUN 15, creatinine 1.2.  CK  and troponin are negative x1.  Cholesterol is 161 with an LDL of 103, HDL of  47.   Chest x-ray is negative.   INR 1.0.  UA is unremarkable.   EKG shows sinus rhythm without acute changes.   DISPOSITION:  The patient is discharged in stable condition.   FOLLOWUP:  He will follow up with Dr. Elsie Lincoln in a couple  of weeks.   COMMENT:  It should be noted that the patient did receive IV contrast,  steroids and Pepcid prior to his intervention.       LKK/MEDQ  D:  08/15/2004  T:  08/15/2004  Job:  [id number]   cc:   Candyce Churn. Allyne Gee, M.D.  [address]  [address]  [place]  [place] [address]  Fax: [phone]

## 2010-07-21 NOTE — Discharge Summary (Signed)
[name], [name]             ACCOUNT NO.:  [id number]   MEDICAL RECORD NO.:  [id number]          PATIENT TYPE:  INP   LOCATION:  2852                         FACILITY:  [facility]   PHYSICIAN:  Madaline Savage, M.D.DATE OF BIRTH:  Jun 28, 1955   DATE OF ADMISSION:  09/09/2007  DATE OF DISCHARGE:  09/09/2007                               DISCHARGE SUMMARY   DISCHARGE DIAGNOSES:  1. Chest pain, negative myocardial infarction.  2. Coronary disease with patent stent to the circumflex, stable      coronary anatomy, noncardiac source of chest pain.  3. Hypertension.  4. Hyperlipidemia.  5. History of prostatectomy secondary to prostate cancer.   PROCEDURES:  On September 09, 2007, combined left heart catheterization by Dr.  Madaline Savage.   The patient was admitted on September 09, 2007, originally as an inpatient for  unstable angina but he went from the ER to the cath lab and was found to  have stable coronary arteries.  Therefore he did not need to be admitted  and he was discharged that day from short stay.   DISCHARGE MEDICATIONS:  His discharge meds were the same as his  inpatient meds which included,  1. Enablex 15 mg daily.  2. Fish oil 1200 daily.  3. Nexium 40 daily.  4. Hydroxyzine 25 daily.  5. Vitamin D 1000 mg daily.  6. Diovan 80 daily.  7. Toprol-XL 25 daily.  8. Aspirin 81 two daily.  9. Vytorin 10/80 daily.  10.Cream to hands as before.   The patient will follow up with Dr. Elsie Lincoln.   LABORATORY VALUES:  Hemoglobin 14, hematocrit 42, WBC 9.4, platelets  291, neutrophils 66, lymphs 26,  monos 7, eos 1, basos 1.  Protime 13.3.  INR of 1 and PTT 29.  Chemistry was all within normal limits and cardiac  enzymes were entirely normal.  TSH 1.296.   EKG sinus rhythm, nonspecific T-wave changes.  Chest x-ray without  active disease.      Darcella Gasman. Annie Paras, N.P.    ______________________________  Madaline Savage, M.D.    LRI/MEDQ  D:  10/14/2007  T:  10/15/2007   Job:  [id number]   cc:   Madaline Savage, M.D.

## 2010-07-21 NOTE — Discharge Summary (Signed)
[name]. [facility]  Patient:    [name] Visit Number: [id number] MRN: [id number]          Service Type: CAT Location: 3700 [id number] [id number] Attending Physician:  Robynn Pane Dictated by:   Eduardo Osier Sharyn Lull, M.D. Admit Date:  01/16/2001 Discharge Date: 01/17/2001   CC:         [name]., M.D.   Discharge Summary  ADMISSION DIAGNOSES: 1. Accelerated angina, positive stress Cardiolite, rule out restenosis. 2. Coronary artery disease, status post posterolateral wall myocardial    infarction in April of 2002. 3. Hypercholesterolemia. 4. Positive family history of coronary artery disease. 5. History of tobacco abuse.  FINAL DIAGNOSES: 1. Accelerated angina, positive stress Cardiolite, status post percutaneous    transluminal coronary angioplasty to obtuse marginal 1. 2. Coronary artery disease, status post posterolateral wall myocardial    infarction in April 2002. 3. Hypercholesterolemia. 4. History of tobacco abuse. 5. Positive family history of coronary artery disease. 6. History of diverticulosis.  DISCHARGE HOME MEDICATIONS: 1. Toprol XL 50 mg one tablet daily. 2. Altace 2.5 mg one capsule daily. 3. Enteric coated aspirin 325 mg one tablet daily. 4. Plavix 75 mg one tablet daily with food. 5. Lipitor 20 mg one tablet daily. 6. Nitrostat 0.4 mg sublingual, use as directed.  ACTIVITY:  As tolerated.  DIET:  Low salt, low cholesterol diet.  DISCHARGE INSTRUCTIONS:  Post angioplasty and stent instructions have been given.  FOLLOW-UP:  He will follow up with me in one week and primary M.D. in two weeks.  CONDITION ON DISCHARGE:  Stable.  BRIEF HISTORY AND HOSPITAL COURSE:  Mr. [name] is a 55 year old black male with past medical history significant for coronary artery disease, status post posterolateral wall MI and status post PTCA and stenting to OM1 in April of 2002 and PTCA and stenting to OM1 in May of  2002, hypercholesterolemia, tobacco abuse, positive family history of coronary artery disease, and history of diverticulosis, who came to the office complaining of recurrent retrosternal chest pain off and on associated with minimal exertion or with stress.  It was relieved with one to two sublingual nitroglycerin.  Chest pain was similar in nature when he had an MI.  He denies any nausea, vomiting, diaphoresis, denies PND, orthopnea or leg swelling.  The patient underwent stress Cardiolite on January 10, 2001, which showed recurrent inferolateral wall ischemia with ejection fraction of 52%.  PAST MEDICAL HISTORY:  As above.  PAST SURGICAL HISTORY:  He had right wrist ganglion surgery x 2 many years ago.  He had right foot surgery many years ago.  ALLERGIES:  No known drug allergies.  MEDICATIONS AT HOME: 1. Toprol XL 50 mg p.o. q.d. 2. Altace 2.5 mg p.o. q.d. 3. Aspirin 325 mg p.o. q.d. 4. Plavix 75 mg p.o. q.d. 5. Lipitor 20 mg p.o. q.d. 6. Nitrostat 0.4 mg sublingual use as directed.  SOCIAL HISTORY:  He is married.  He works for [employer].  He was born in [place], [place].  [place].  He has smoked one pack per day for 30+ years.  No history of alcohol abuse.  FAMILY HISTORY:  Father is alive and in good health.  Mother is diabetic.  He has two uncles who died of MI in their 16s.  PHYSICAL EXAMINATION:  GENERAL APPEARANCE:  He was awake, alert and oriented x 3, in no acute distress.  VITAL SIGNS:  Blood pressure 114/78,  pulse 68 and regular.  HEENT:  Conjunctiva pink.  NECK:  Supple.  No JVD, no bruit.  LUNGS:  Clear to auscultation without rhonchi or rales.  CARDIOVASCULAR:  S1 and S2 normal, there was no S3 gallop.  ABDOMEN:  Soft, bowel sounds present.  Abdomen was nontender.  EXTREMITIES:  There was no clubbing, cyanosis, or edema.  EKG showed normal sinus rhythm and no T-wave inversion.  Minor ST depression in  inferior leads and old posterior wall MI.  LABS:  Precath labs showed sodium 140, potassium 4.0, chloride 102, bicarb 24, glucose was 138 which was nonfasting.  His hemoglobin was 16.1, hematocrit 46.3, BUN 15, creatinine 1.3.  BRIEF HOSPITAL COURSE:  The patient was an a.m. admit and underwent left cardiac catheterization and selective left and right coronary angiography and PTCA to OM1 as per cath report.  The patient tolerated the procedure well. There were no complications.  Sheaths were pulled out the same day as hospital procedure.  The patient has been ambulating in the hallway without any problem.   There is no evidence of hematoma or bruit in the groin.  Post PTCA, his CPKs are normal.  His hemoglobin and hematocrit are stable.  His BUN and creatinine are stable.  The patient did not have any further episodes of chest pain during the hospital stay. The patient will be discharged home on the above medications and will be followed up in my office in one week and with [name]., M.D., in two weeks. Dictated by:   Eduardo Osier Sharyn Lull, M.D. Attending Physician:  Robynn Pane DD:  01/17/01 TD:  01/17/01 Job: [id number]

## 2010-07-21 NOTE — H&P (Signed)
[place], [name]             ACCOUNT NO.:  [id number]   MEDICAL RECORD NO.:  [id number]          PATIENT TYPE:  INP   LOCATION:  1824                         FACILITY:  [facility]   PHYSICIAN:  Lonia Blood, M.D.      DATE OF BIRTH:  04-Mar-1956   DATE OF ADMISSION:  09/21/2004  DATE OF DISCHARGE:                                HISTORY & PHYSICAL   PRIMARY CARE PHYSICIAN:  Dr. Dorothyann Peng.   PRESENTING COMPLAINT:  Lower abdominal pressure and dysuria for three days.   HISTORY OF PRESENT ILLNESS:  Patient is a 55 year old African-American male  with history of coronary artery disease status post multiple PCIs, last one  performed on August 14, 2004.  Patient has been fairly active and doing well  until Monday when he was unable to void.  Per patient he felt lots of  pressure and tried going to the bathroom and could not pass both stool and  urine.  By Tuesday he was able to pass urine.  However, he was getting more  frequency and gradually having pain when passing the urine.  Yesterday he  had been on a fever in addition to his frequency.  He decided then to come  to the emergency room today.  Patient described his abdominal pain as mainly  pressure in the lower part associated with his symptoms.  He still has not  been able to pass any stool despite taking some stool softeners.  Denied any  chest pain, nausea, vomiting.  Patient did complain of some pain in the back  associated with his symptoms.   PAST MEDICAL HISTORY:  1.  Coronary artery disease.  2.  Status post multiple PCIs last on August 14, 2004.  3.  History of hypertension now controlled.  4.  Dyslipidemia.  5.  GERD.  6.  Seasonal allergies.  7.  Remote history of tobacco abuse.  8.  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   Patient has IV drug dye allergy.   ALLERGIES:  IVP DYE.   MEDICATIONS:  1.  Plavix 75 mg daily.  2.  Imdur 60 mg daily.  3.  Protonix 40 mg daily.  4.  Hydroxyzine 25 mg b.i.d.  5.  Toprol 25 mg  daily.  6.  Aspirin 162 mg daily.  7.  Allegra 180 mg p.r.n.  8.  Vytorin 10/80 one tablet daily.  9.  Diovan 80 mg a day.   SOCIAL HISTORY:  Patient works for [employer].  Usually works from  home.  He quit smoking several years ago.  Denied any IV drug use.  He  occasionally drinks alcohol socially.  Patient is married.   FAMILY HISTORY:  Significant for coronary artery disease.  Two of his uncles  died of MI in their 59s.  Mother is also diabetic.   REVIEW OF SYSTEMS:  10-point review of systems essentially as in HPI.  Patient denied any other symptoms except those mentioned in HPI.   PHYSICAL EXAMINATION:  VITAL SIGNS:  Temperature 99.2, blood pressure  121/84, pulse 125, respiratory rate 18, saturations 97% on room air.  GENERAL:  Patient is awake, alert, oriented, in just mild distress due to  the pain below.  HEENT:  PERRL.  EOMI.  NECK:  Supple.  No JVD.  No lymphadenopathy.  RESPIRATORY:  Good air entry bilaterally.  No wheezes or rales.  CARDIOVASCULAR:  He is mildly tachycardic.  ABDOMEN:  Full, tympanitic.  Mild discomfort in lower abdomen.  EXTREMITIES:  No edema, cyanosis, or clubbing.   LABORATORIES:  Cloudy urine with moderate hemoglobin, some ketones,  proteinuria of 30, large leukocyte esterase, but nitrite negative, wbc's  were too numerous to count, rbc's 3-6, and few bacteria.  Sodium 137,  potassium 3.6, chloride 105, CO2 23, glucose 127, BUN 12, creatinine 1.5,  calcium 9, total protein 6.9, albumin 4.1, AST 31, ALT 32, alkaline  phosphatase 78, total bilirubin 1.3.  His white count is 20.4, hemoglobin  14.7, platelet count 271 with a left shift, ANC of 17.4.  His EKG showed  some sinus tachycardia with a rate of 112.  He has regular intervals,  possibly LVH.  There is T-wave inversion in the lateral leads, but no  significant change from the previous except for the tachycardia.   ASSESSMENT:  1.  This is a 55 year old African-American male  presenting with what appears      to be pyelonephritis.  Patient is young and it is not clear where the      source of his infection is.  It is clear that the nitrite is negative      indicating that this is probably not a coliform.  Patient had very much      protein in urine.  He denied any prior urinary symptoms.  No evidence of      prostatitis at this point and no history of BPH.  With a white count      high and with the fever and the urinary findings, CVA tenderness was not      elicited.  However, everything points toward possible pyelonephritis.      Patient is not vomiting at the moment but due to his multiple medical      problems we will need to admit him for at least 23-hour observation,      start him on intravenous antibiotics.  Once he is much more stable and      strong will convert him to p.o. ciprofloxacin prior to discharge.  2.  Will also check urine culture and sensitivity to identify the organism      specifically.  Will also hydrate the patient gently while taking blood      cultures as well.  At this point I will choose Cipro plus or minus      augmentation with Rocephin one-time dose up until we get any urine      cultures back and if we need to change the antibiotics, we will do it at      that time.  3.  Coronary artery disease.  Seems to be stable in patient.  No chest pain      and no reason to suggest cardiac involvement at this point.  Will      therefore proceed conservatively with regard to the heart.  4.  Hypertension.  This seems to be well controlled as well so I will make      no changes at this point.  5.  Dyslipidemia.  I will continue his current dose of Vytorin without any      change.  6.  Allergies.  Patient is currently not having any symptoms.  I will      therefore hold the Allegra that he is taking.  7.  Constipation.  I will put patient on scheduled laxatives secondary to     his lack of bowel movements so far.  If possible, I will use  magnesium      citrate to stimulate his bowels initially.  8.  Gastroesophageal reflux disease.  I will continue with his Protonix      while in the hospital.      Lonia Blood, M.D.  Electronically Signed     LG/MEDQ  D:  09/21/2004  T:  09/21/2004  Job:  [id number]   cc:   Candyce Churn. Allyne Gee, M.D.  [address]  [address]  [place]  [place] [address]  Fax: [phone]

## 2010-07-21 NOTE — Discharge Summary (Signed)
[place]. [facility]  Patient:    [name], [name] Visit Number: [id number] MRN: [id number]          Service Type: MED Location: 2000 2015 01 Attending Physician:  Robynn Pane Dictated by:   Eduardo Osier Sharyn Lull, M.D. Admit Date:  01/22/2001 Discharge Date: 01/24/2001                             Discharge Summary  ADMISSION DIAGNOSES: 1. Accelerated angina, rule out restenosis. 2. Coronary artery disease. 3. History of posterolateral wall myocardial infarction in the past. 4. Status post recent percutaneous transluminal coronary angioplasty to ostia    of first obtuse marginal. 5. Hypertension. 6. Hypercholesterolemia. 7. History of tobacco use. 8. Positive family history of coronary artery disease.  DISCHARGE DIAGNOSES:  1. Stable angina.  2. Mildly positive stress Cardiolite.  3. Coronary artery disease.  4. History of posterolateral wall myocardial infarction.  5. Status post percutaneous transluminal coronary angioplasty and stenting     x 2 to first obtuse marginal.  6. Status post percutaneous transluminal coronary angioplasty to first obtuse     marginal approximately one week ago.  7. Hypertension.  8. Hypercholesterolemia.  9. Tobacco abuse. 10. Positive family history of coronary artery disease.  DISCHARGE MEDICATIONS: 1. Toprol XL 25 mg one tablet daily. 2. Altace 2.5 mg one capsule daily. 3. Nitro-Dur 0.2 mg/hr applied to chest wall in the a.m. and off at night. 4. Enteric-coated aspirin 325 mg one tablet daily. 5. Plavix 75 mg one tablet daily with food. 6. Nitrostat 0.4 mg sublingual to use as directed. 7. Lipitor 200 mg one tablet daily. 8. Wellbutrin one tablet daily as before.  ACTIVITY:  Avoid heavy exertion, lifting, pushing, or pulling.  DIET:  Low salt, low cholesterol.  FOLLOW-UP:  Follow up with me in 7-10 days.  CONDITION ON DISCHARGE:  Stable.  BRIEF HISTORY:  [name] is a 55 year old black male with a  past medical history significant for posterolateral wall MI, status post PTCA and stenting to OM1 x 2 in April and May of 2002 and then PTCA to OM1 approximately one week ago using a cutting balloon.  History of hypertension, hypercholesterolemia, and tobacco abuse.  History of diverticulosis.  He was recently discharged from the hospital.  He came to the ER complaining of retrosternal chest pain radiating across the chest, grade 6/10.  He took three sublingual nitroglycerin at home with partial relief.  He decided to come to the ER.  The patient denies any nausea, vomiting, diaphoresis, and shortness of breath.  The chest pain felt similar in nature when he had MI.  Since hospital discharge, he had mild chest pain on Saturday and Sunday, but did not require any nitroglycerin.  He states that he is under a lot of stress at work and last night had an argument with his wife and could not sleep.  He slept two to three hours.  Presently he denies any chest pain or pressure.  He denies a history of rest or exertional angina.  PAST MEDICAL HISTORY:  As above.  PAST SURGICAL HISTORY:  He had right wrist surgery many years ago and right foot surgery many years ago.  ALLERGIES:  No known drug allergies.  MEDICATIONS: 1. Toprol XL 50 mg p.o. q.d. 2. Altace 2.5 mg p.o. q.d. 3. Enteric-coated aspirin 325 mg p.o. q.d. 4. Plavix 75 mg p.o. q.d. 5. Nitrostat sublingual p.r.n. 6.  Lipitor 20 mg p.o. q.d. 7. Wellbutrin one p.o. q.d.  SOCIAL HISTORY:  He is married.  He works for [employer].  He was born in [place], [place].  He [place] in [place], [place].  He smoked one pack per day for 30 years.  No history of alcohol abuse.  FAMILY HISTORY:  Positive for coronary artery disease.  Two uncles died of MI in their 38s.  His mother is diabetic.  His father is in good health.  PHYSICAL EXAMINATION:  He was alert, awake, and oriented x 3 and in no acute distress.  VITAL  SIGNS:  The blood pressure was 106/70.  The pulse was 64.  HEENT:  The conjunctivae are pink.  NECK:  Supple.  No JVD.  No bruit.  LUNGS:  Clear to auscultation without rhonchi or rales.  CARDIAC:  S1 and S2 were normal.  There was no S3 gallop.  ABDOMEN:  Soft.  Bowel sounds are present.  Nontender.  EXTREMITIES:  There was no clubbing, cyanosis, or edema.  The right groin showed no evidence of hematoma or bruit.  LABORATORY DATA:  Two sets of CPKs were negative.  Troponin I was negative. The hemoglobin was 14.4 and hematocrit 42.4.  His C reactive protein was low at 0.1.  Two sets of troponin I were negative.  The sodium was 138, potassium 4.1, chloride 104, bicarbonate 27, glucose 94, BUN 18, and creatinine 1.3.  BRIEF HOSPITAL COURSE:  The patient was admitted to the telemetry unit.  MI was ruled out by serial enzymes and EKG.  The patient underwent stress Cardiolite on January 23, 2001, and exercised for 12.16 minutes on Bruce protocol.  He complained of fatigue and shortness of breath.  No chest pain was reported.  The peak heart rate achieved was 150.  The peak blood pressure was 140/80.  The EKG showed normal sinus rhythm with old posterior wall MI and nonspecific ST-T wave changes at baseline.  More than 1 mm ST depression was noted in inferolateral leads at peak of exercise, which reverted to its baseline immediately in recovery phase.  No abnormalities were noted.  The Cardiolite scan showed persistent inferolateral wall ischemia, although the area of ischemia appeared smaller as compared to the prior stress test which was done three weeks ago.  I discussed with the patient at length about the stress Cardiolite finding and various options of treatment, i.e. medical versus invasive and its risks and benefits.  The patient agreed for medical management at this point.  If he gets recurrent chest pain, prolonged chest pain, or severe pain requiring frequent nitroglycerin,  will proceed with invasive therapy.  The patient did not have any episodes of chest pain during  the hospital stay.  The patient will be discharged on the above medications and will be followed up in my office in 7-10 days. Dictated by:   Eduardo Osier Sharyn Lull, M.D. Attending Physician:  Robynn Pane DD:  01/24/01 TD:  01/26/01 Job: [id number]

## 2010-07-21 NOTE — Consult Note (Signed)
[place]. [facility]  Patient:    [name], [name]                    MRN: [id number] Proc. Date: 07/04/00 Adm. Date:  [id number] Attending:  Eino Farber                          Consultation Report  REFERRING PHYSICIAN:  Eino Farber., M.D.  BRIEF HISTORY:  The pleasant 55 year old black gentleman was referred for an evaluation of GI bleeding.  The physician had initially consulted me on the day of admission, however, because of a misspelling in his name the patient was not able to found.  I was recontacted today about the patient and the correct spelling was given to my staff at that time.  The patient was recently diagnosed as having a myocardial infarction approximately 1 month ago.  He has been treated and started on Coumadin medication.  He has been relatively stable at this time, up until recently when the patient had at the time had gone to bathroom and noticed bright red blood per rectum that was noted. There was no associated pain that was present at that time.  This occurred at approximately 2 oclock in the morning.  He contacted his primary physician who had given him advise on what he could do.  However, the bleeding continued over the course of several more times being just fully bright red and filling the commode at that time.  He subsequently contacted his cardiologist who advised him to come to the emergency room at this time.  He denies any history of any abdominal pains or discomforts as noted.  He does have a history of diverticulosis that was present but he has not had any bleeding from his diverticuli in the past.  However, he had been given a diet when he was initially treated for his myocardial disease of eating nuts to increase his fiber and his food products at this time.  Subsequently he had been eating increased nuts over the course of the past month.  He still was taking his blood thinners on a daily  basis and has not had any problems until these events had occurred at this time.  CURRENT MEDICATIONS:  The patient is presently taking:  1. Protonix 40 mg q.d.  2. Lipitor 20 mg q.d.  3. Nitro tabs 0.4 p.r.n.  4. Altace 2.5 mg q.d.  5. Toprol XL 50 mg q.d.  6. Plavix 75 mg q.d.  7. Ecotrin 32 mg q.d.  8. Hydroxyzine 0.5 mg b.i.d.  9. Wellbutrin 150 ng q.d. 10. Tycron 400 mg q.d. for 7 days. 11. Promethazine for cough that is present.  REVIEW OF SYSTEMS:  Positive for the cardiac disease as mentioned with a myocardial infarction of June 06, 2000.  Otherwise his review of systems is essentially unremarkable.  PHYSICAL EXAMINATION:  GENERAL:  This is a pleasant gentleman who appears to be in no acute distress, resting comfortably in the bed today.  VITAL SIGNS:  Blood pressure of 110/58, pulse rate 66, respiratory rate 18.  HEENT:  Anicteric.  There is some mild arcus senilis noted.  NECK:  Supple.  LUNGS:  Clear to auscultation and percussion.  HEART:  Regular rate and rhythm without heaves, thrills, murmurs or gallops appreciated.  ABDOMEN:  Soft.  There was no tenderness.  No hepatosplenomegaly that is appreciated.  EXTREMITIES:  Showed no clubbing,  cyanosis, or edema that is noted.  LABORATORY DATA:  Showed a hemoglobin of 12.3, hematocrit 35.9, initially. Repeated was 11.5, and 33 and then subsequently remained around 12.5 and 36. Troponin I level showed no indications of a myocardia event that was ongoing.  The cholesterol level showed an HDL of 29, an LDL of 75, and a total cholesterol of 120.  PTs is approximately 14.9, PTT is 35, INR 1.3 as noted. CK is 60, MB is 1.3, and as mentioned before troponin I is 0.1 that is noted.  GROSS IMPRESSION:  1. GI bleeding, most likely related to diverticulosis which the triggering     factors are probably the Coumadin therapy which the patient was taking     prior and the nuts that he was eating prior to being aware of  the     diverticular process being present.  2. Possible avascular malformations.  3. Rule out polypoid lesions that may be present at this time.  PRESENT RECOMMENDATIONS:  The patient has gone now for a barium enema study recently and presently revealing the contrast study itself.  The unprepped barium enema which shows no evidence of a constricting or obvious lesion that was present at this time.  Diverticulosis in the rectosigmoid and descending colon was appreciated and the rest of the study appears to be unremarkable at this time.  With these results that are noted and no further bleeding I would recommend possibly conservative therapy, however, I will discuss this with the primary physician and if he feels that a necessary procedure be performed to ensure any problems, would do a limited flexible sigmoidoscope at the moment to determine if there is any evidence of diverticular problems and if not would just continue with the conservative management.  I will follow the patient on an outpatient basis with you if the patient is discharged to possibly schedule him for an elective procedure after approximately 6 months post MI and depending upon these results will determine the course of therapy.  Thank you much for the consultation. DD:  07/04/00 TD:  07/05/00 Job: [id number]

## 2010-07-21 NOTE — Op Note (Signed)
NAMETAHSIN, BENYO             ACCOUNT NO.:  [id number]   MEDICAL RECORD NO.:  [id number]          PATIENT TYPE:  INP   LOCATION:  1608                         FACILITY:  [facility]   PHYSICIAN:  Valetta Fuller, M.D.  DATE OF BIRTH:  1955/09/07   DATE OF PROCEDURE:  DATE OF DISCHARGE:                                 OPERATIVE REPORT   PREOPERATIVE DIAGNOSIS:  Clinically localized adenocarcinoma of the  prostate.   POSTOPERATIVE DIAGNOSIS:  Clinically localized adenocarcinoma of the  prostate.   PROCEDURE PERFORMED:  1.  Robotic-assisted laparoscopic radical prostatectomy/bilateral nerve-      sparing.  2.  Bilateral pelvic lymphadenectomy.   SURGEON:  Valetta Fuller, M.D.   ASSISTANT:  Heloise Purpura, M.D.   ANESTHESIA:  General.   COMPLICATIONS:  None.   ESTIMATED BLOOD LOSS:  400-500 cc.   INDICATIONS:  Mr. Shenker is a 55 year old male who is a patient of Dr. Su Grand.  Eighteen months ago he had a normal PSA.  This previous summer he was  hospitalized for diverticulitis and acute prostatitis and a PSA was obtained  at that time which was over 100.  Dr. Brunilda Payor felt that was due to acute  prostatitis and indeed with antibiotic therapy his PSA came down but  remained elevated around 16.  An additional course of antibiotics was  undertaken but his PSA remained elevated in the 16-17 range. The patient  subsequently had an ultrasound which showed a 30-gram prostate.  He did have  left-sided adenocarcinoma which appeared to be small-volume Gleason 3+3=6  tumor.  His PSA was certainly much higher than the volume of cancer.  The  patient did undergo imaging studies which failed to reveal any definitive  evidence of metastatic disease.  The patient does have long-standing  erectile dysfunction.  The patient underwent extensive consultation with Dr.  Brunilda Payor and also myself with regard to treatment options.  The patient elected  to have radical retropubic prostatectomy and chose a  robotic approach.  We  had a lengthy discussion with him with regard to the advantages and  disadvantages of a surgical procedure of this nature.  The patient appeared  to understand all the complications that could occur.  We specifically  discussed obviously a high likelihood of ongoing erectile dysfunction status  post his procedure.  We did feel that bilateral nerve-sparing would be  legitimate but uncertain as to how advantageous that would be for him with  regard to his preoperative sexual functioning.  We talked about issues with  regard to incontinence and the other complications that can occur.  The  patient wanted to proceed with that procedure and now presents for this.   TECHNIQUE AND FINDINGS:  The patient had undergone full bowel prep and  received some preoperative antibiotics.  He was brought to the operating  room and had the successful induction of a general anesthetic.  He was  placed in the dorsal lithotomy position and prepped and draped in the usual  sterile manner.  Once positioning was confirmed and all extremities were  carefully padded, we went ahead with  the procedure.  A site was chosen 18 cm  from the pubic symphysis just lateral and left of the umbilicus for  placement of the camera port.  This was placed using the standard Hasson  open technique.  Once the fascia was opened, finger dissection was used and  no obvious adhesions were noted.  A 12-mm port was placed and the abdomen  was insufflated.  A Foley catheter had been previously placed and the  bladder emptied.  Careful inspection of the abdomen did not reveal any  significant abnormalities and there were no adhesions.  Bilateral 8-mm  robotic ports were placed approximately 16 cm from the pubic symphysis and  lateral to the camera port.  A 5-mm port was placed for sucker and a third  arm robotic port was placed.  An additional 12-mm laparoscopic assist port  was also placed.  All these placements were  done with direct visual guidance  and no evidence of bowel injury was noted. Once all our ports were in place,  the surgical cart was then docked.   The hook cautery was utilized, reflecting the bladder posteriorly and  allowing entry into the space of Retzius and identification of the prostate  and endopelvic fascia.  The endopelvic fascia was then incised from the apex  back to the base of the prostate and underlying levator musculature was  swept away.  The dorsal vein complex was isolated and divided utilizing an  ETS stapler down to the urethra.  The bladder neck was then identified and  divided anteriorly with cautery.  The Foley balloon was then deflated and  the Foley catheter was used to retract the prostate anteriorly.  The  posterior bladder neck was then dissected.  The ureteral orifices were  identified utilizing indigo carmine. Posterior to the bladder neck, the vas  deferens and seminal vesicles were identified.  These structures were  individually isolated and retracted anteriorly.  The space between the  anterior rectum and Denonvilliers' fascia was then bluntly developed,  isolating the vascular pedicles.  Scissors were used to incise the pedicles  and isolate them.  Hemoclips were used for the vascular pedicles.  We also  incised the lateral pelvic fascia of the prostate in order to sweep the  nerve down and be able to identify that throughout the pedicle dissection,  therefore preserving the neurovascular bundles bilaterally.  The urethra was  then sharply divided which allowed the prostatic specimen then to be placed  up in the abdomen.  The pelvis was copiously irrigated.  The rectal Foley  was injected and there was no evidence of any rectal injury.  Hemostasis at  that point was really quite good.   Attention was then turned to pelvic bilateral lymph node dissection.  In  essence, the obturator packet was removed bilaterally.  This was done by taking the lymphatic  tissue between the external iliac vein and obturator  nerve as well as Cooper's ligament.  This was all done with blunt and sharp  dissecting technique utilizing hemoclips.  Both lymph node packets were  removed for permanent pathologic analysis.   Attention was then turned towards reconstruction.  A 2-0 Vicryl suture was  placed between the bladder neck and the urethra at the 6 o'clock position to  reapproximate those structures.  We then used a running 2-0 Monocryl double-  armed anastomotic suture, performing a 360-degree running anastomosis  between the bladder neck and urethra.  A new Foley catheter was then placed  in the bladder without difficulty.  Irrigation was performed and there  appeared to be no leakage.  Urine from the bladder was clear with just some  blue tinge.  A 19-French Blake drain was then brought through the left  robotic port and positioned in the pelvis and then this was secured.  The  surgical cart was undocked.  The Endopouch was used to retrieve the specimen  through the camera port.  0 Vicryl was used to close  the 12-mm camera port site.  The other 12-mm port was closed with the aid of  a suture passer.  All sponge and needle counts were correct.  The incision  sites were then staple closed.  The patient was brought to the recovery room  in stable condition.           ______________________________  Valetta Fuller, M.D.  Electronically Signed     DSG/MEDQ  D:  02/28/2005  T:  03/01/2005  Job:  [id number]   cc:   Lindaann Slough, M.D.  Fax: [phone]   Madaline Savage, M.D.  Fax: [phone]

## 2010-07-21 NOTE — Discharge Summary (Signed)
[place]  Patient:    [name], [name]                    MRN: [id number] Adm. Date:  [id number] Disc. Date: [id number] Attending:  Robynn Pane Dictator:   Dionne D. Su Hilt, N.P. CC:         Dortha Kern, Montez Hageman., M.D.   Discharge Summary  ADMITTING DIAGNOSES: 1. Rectal bleeding consistent with colon, probably bleeding diverticulosis    of the colon. 2. Atherosclerotic heart disease with recent myocardial infarction about one    month ago with stent placement, Plavix, and aspirin.  No recent chest pain. 3. Acute sinusitis and bronchitis, recent. 4. Allergic rhinitis. 5. Hypercholesterolemia. 6. History of gastroesophageal reflux disease.  DISCHARGE DIAGNOSES: 1. Rectal bleeding with bleeding diverticulosis of colon. 2. Recent acute myocardial infarction. 3. Gastroesophageal reflux disease. 4. Hypercholesterolemia. 5. Anemia with gastrointestinal blood loss. 6. Depression, mild. 7. Allergic rhinitis. 8. Acute bronchitis, resolving.  REASON FOR ADMISSION:  The patient is a 55 year old black male with a history of persistent rectal bleeding of bright red blood since early this morning on July 02, 2000.  Blood clots with BM since approximately 2 a.m.  ALLERGIES:  No known drug allergies.  LABORATORY DATA:  EKG on Jul 03, 2000 showed sinus bradycardia, T-wave abnormality consistent with lateral ischemia.  On Jul 12, 2000, barium enema demonstrates nonconstricting lesion or obvious filling defect.  Examination was performed actually as a therapy for rectal bleeding of the diverticular demonstrated of the sigmoid and descending colon.  On July 02, 2000, CBC showed WBC of 13.2, hemoglobin 13.4, hematocrit 39.2, platelets of 276,000. On Jul 05, 2000, hemoglobin is 11.8, hematocrit 34.1.  On Jul 05, 2000, occult blood of stool was positive.  On July 02, 2000, PT was 14.9, INR 1.3.  On July 02, 2000, chemistry studies were within normal  limits.  On Jul 03, 2000, cardiac enzymes were within normal limits.  On Jul 03, 2000, liver profile showed cholesterol of 120, triglycerides of 79, HDL of 29, LDL of 75.  HOSPITAL COURSE:  The patient was admitted.  IV therapy was initiated.  He was started on a clear liquid diet.  Hemoglobin and hematocrit was obtained q.6h. to monitor anemia due to GI blood loss.  He continued on Lipitor 20 mg p.o. q.d. due to the previous history of hypercholesterolemia and myocardial infarction.  The patient had an episode of hypotension and his IV fluids were increased to 150 cc an hour to keep systolic blood pressure greater than 95.  He was started on Protonix 40 mg tablet p.o. q.d.  Eventually, he had his diet advanced to a low fat, low cholesterol diet, and his IV fluid was able to be decreased to 75 cc an hour.  He was started on Plavix 75 mg p.o. q.d.  After the results of his barium enema study which showed a diverticula of the sigmoid and descending colon, which is causing his bleeding, the patient had a GI consult done by Dr. Dortha Kern, Montez Hageman., which he reviewed the barium enema study which was unprepped and recommended continuing the conservative therapy.  The patient had an episode of constipation and he was started on MiraLax 17 gm an ounce of water q.a.m. until daily BMs.  Two tablets were given to him for a p.r.n. basis.  CONDITION ON DISCHARGE:  Prior to his discharge, the patient had no episode of rectal bleeding.  He was doing satisfactory  after the BM he had.  There was only a speck of blood.  The stool for occult blood was positive but no gross bleeding.  DISCHARGE MEDICATIONS: 1. Plavix 75 mg 1 q.d. 2. Lipitor 20 mg p.o. q.d. 3. Wellbutrin SR 150 mg tablet 1 b.i.d. for depression symptoms. 4. Protonix 40 mg tablet 1 q.d. 5. Atarax 25 mg 1 q.6h. p.r.n. allergies. 6. Robitussin 10 cc q.i.d. for thick sputum.  DISCHARGE INSTRUCTIONS:  Activity as tolerated.  Diet is low fat,  low cholesterol diet.  Hold aspirin for now.  Follow-up appointment in the office with Dr. Mina Marble, Jr. and follow up with Dr. Eduardo Osier. Harwani and Dr. Dortha Kern, Montez Hageman. as directed. DD:  07/31/00 TD:  07/31/00 Job: [id number]

## 2010-07-21 NOTE — H&P (Signed)
NAMEDEADRICK, [name]             ACCOUNT NO.:  [id number]   MEDICAL RECORD NO.:  [id number]          PATIENT TYPE:  INP   LOCATION:  0004                         FACILITY:  [facility]   PHYSICIAN:  Valetta Fuller, M.D.  DATE OF BIRTH:  03/03/1956   DATE OF ADMISSION:  02/28/2005  DATE OF DISCHARGE:                                HISTORY & PHYSICAL   CHIEF COMPLAINT:  Is adenocarcinoma of the prostate.   HISTORY OF PRESENT ILLNESS:  [name] is a 55 year old male.  He is a  patient of Candiss Norse, M.D.  He has been followed in the past and  had a normal PSA about 18 months ago.  He was hospitalized back this  previous summer with what sounds like an acute diverticulitis as well as  possibly some acute prostatitis.  A PSA was done at that time which was  markedly elevated at over 100.  The patient was treated with a several weeks  antibiotics and his PSA came down to 16.  He was given an additional course  of antibiotics but his PSA remained elevated at 17.  For that reason, the  patient had ultrasound biopsy by Dr. Brunilda Payor in October of this year.  His  prostate volume was fairly normal around 32 cubic centimeters.  The patient  had actually low volume prostate cancer with positive biopsies only at the  left base. This involved only about 5% of the material and a Gleason score  was 3 + 3 + = 6.  The patient did have focal areas of high grade PIN.  All  the left-sided biopsies were negative.  The patient remains completely  asymptomatic.  He has undergone extensive counseling by myself as well as  Dr. Brunilda Payor and elected to proceed with laparoscopic robotic assisted  prostatectomy with bilateral pelvic lymph node dissection.   PAST MEDICAL HISTORY:  Is notable for coronary artery disease.  He has had  preoperative cardiac clearance by Dr. Orvan Falconer.  He has had a cardiac stent  placed in the right coronary vessel otherwise appears to be low risk for the  surgery.  The patient does  have preoperative erectile dysfunction.   CURRENT MEDICATIONS:  Include DiaBeta, Toprol XL, Vytorin, aspirin, Plavix,  both on hold, Allegra, hydroxyzine, Nexium, and sublingual nitroglycerin as  necessary.   HABITS:  The patient does have a previous 30 pack year smoking history and  quit 4-5 years ago.   FAMILY HISTORY:  Is positive for diabetes, prostate cancer and hypertension.   REVIEW OF SYSTEMS:  Is notable for erectile dysfunction and a little bit of  lethargy   On exam, he is a well-developed, well-nourished male.  He is in no acute  distress.  His current blood pressure is 128/69 with a pulse 83 and he is  afebrile.  Current weight is approximately 213 pounds.  NECK:  Showed no JVD.  CHEST:  Clear to auscultation.  ABDOMEN:  Was soft and nontender without obvious masses, hepatosplenomegaly  or tenderness.  EXTERNAL GENITALIA:  Penis shows a normal meatus.  Scrotum, testes, adnexal  structures were normal.  Prostate is 1+ in size without worrisome features  and nonpalpable femoral vessels.  EXTREMITIES:  Showed no edema.   DATA:  Preoperative hemoglobin was 15.6 with normal renal function studies.   ASSESSMENT:  Adenocarcinoma of the prostate.  The patient will undergo  hopefully uneventful robotic laparoscopic assisted radical retropubic  prostatectomy with bilateral pelvic lymph node dissection  and will  hopefully be admitted for routine postoperative care.           ______________________________  Valetta Fuller, M.D.  Electronically Signed     DSG/MEDQ  D:  02/28/2005  T:  02/28/2005  Job:  [id number]   cc:   Madaline Savage, M.D.  Fax: [phone]

## 2010-07-21 NOTE — Cardiovascular Report (Signed)
NAME:  [name], [name]                       ACCOUNT NO.:  [id number]   MEDICAL RECORD NO.:  [id number]                   PATIENT TYPE:  OIB   LOCATION:  NA                                   FACILITY:  [facility]   PHYSICIAN:  [name], M.D.              DATE OF BIRTH:  [birth year]   DATE OF PROCEDURE:  10/06/2002  DATE OF DISCHARGE:                              CARDIAC CATHETERIZATION   PROCEDURE:  1. Selective left and right coronary angiography.  2. Left ventriculography via right groin using Judkins technique.   INDICATIONS:  [name] is a 55 year old black male with past medical  history significant for posterolateral wall MI, status post PTCA/stenting to  OM 1 in April of 2002.  Subsequently, had also restenosis requiring stenting  to ostial OM 1 in May of 2001.  Subsequently, had restenosis in November of  2002 requiring cutting balloon angioplasty in November of 2002.  History of  hypertension, hypercholesterolemia, history of tobacco abuse, history of  diverticulosis.  Complains of retrosternal and left infraclavicular chest  pain without associated symptoms.  Chest pain relieved with rest and  sublingual nitroglycerin.  Chest pains feel similar in nature when he had  MI.  Denies any nausea, vomiting, diaphoresis.  Denies PND, orthopnea, leg  swelling.  The patient denies palpitations, lightheadedness, or syncope.  The patient underwent stress Cardiolite on September 30, 2002, which showed no  evidence of vessel ischemia, but, EKG showed minor ST depression in  inferolateral leads with EF of 52%.  Due to recurrent typical chest pain and  positive stress Cardiolite by EKG criteria, discussed with patient various  options of treatment,  i.e., medical versus invasive and consents for a PCI.   PAST MEDICAL HISTORY:  As above.   PAST SURGICAL HISTORY:  Right wrist and right foot surgery.   ALLERGIES:  No known drug allergies.   MEDICATIONS:  1. Toprol XL 25 mg p.o.  daily.  2. Baby aspirin 81 mg p.o. daily.  3. Altace 2.5 mg p.o. daily.  4. Nitro-Dur 0.2 mg per hour daily.  5. ________ 10 mg p.o. daily.  6. Wellbutrin one tablet daily.   SOCIAL HISTORY:  He is married.  Works for [employer]; born in  [place] and lives in [place].  Smoked one postpartum for 30+ years.  No  history of alcohol abuse.   FAMILY HISTORY:  Positive for coronary artery disease.  His two uncles died  of MI in their 26s.  Mother is diabetic.  Father is in good health.   PHYSICAL EXAMINATION:  GENERAL:  He was alert, awake, oriented x3.  No acute  distress.  VITAL SIGNS:  Blood pressure 110/70, pulse 68, resolved.  HEENT:  Conjunctiva was pink.  NECK:  Supple.  No JVD.  No bruit.  LUNGS:  Clear to auscultation without rhonchi, rales.  CARDIOVASCULAR:  S1 and S2 were normal.  There was no S3  gallop.  ABDOMEN:  Soft.  Bowel sounds are present.  Nontender.  EXTREMITIES:  There is no clubbing, cyanosis or edema.   IMPRESSION:  1. Accelerated angina, mildly positive stress test.  2. Coronary artery disease.  3. History of posterolateral wall myocardial infarction.  4. Hypertension.  5. Hypercholesterolemia.  6. History of tobacco abuse.   RECOMMENDATIONS:  Discussed at length with patient regarding stress  Cardiolite results and various options of treatment, i.e., medical versus  invasive, its risk, i.e., death, MI, stroke, need for emergency CABG, risk  of restenosis, local vessel complications, etc., and consented for PCI.   PROCEDURE:  After obtaining the informed consent patient was brought to the  cath lab and was placed on fluoroscopy table and the right groin was prepped  and draped in usual fashion. Xylocaine 2% was used for local anesthesia in  the right groin.  With the help of thin walled needle, a 6-French arterial  sheath was placed.  The sheath was aspirated and flushed.  Next, 6-French  left Judkins catheter was advanced over the wire under  fluoroscopic guidance  up to the ascending aorta where it was pulled out.  The catheter was  aspirated and connected to the manifold.  Catheter was further advanced and  engaged into left coronary ostium.  Multiple views of the left system were  taken.  Next, the catheter was disengaged and was pulled out over the wire  and was replaced with 6-French right Judkins catheter which was advanced  over the wire under fluoroscopic guidance up to the ascending aorta where it  was pulled out.  The catheter was aspirated and connected to the manifold.  Catheter was further advanced and engaged into the right coronary ostium.  Multiple views of the right system were taken.  Next, the catheter was  disengaged and was pulled out over the wire and was replaced with 6-French  pigtail catheter which was advanced over the wire under fluoroscopic  guidance up to the ascending aorta where it was pulled out.  The catheter  was aspirated and connected to the manifold.  Catheter was further advanced  across the aortic valve into the LV.  LV pressures were recorded.  Next, LV  graphy was done in 30 degree RAO position.  Post angiographic pressures were  recorded from LV and then pullback pressures recorded from aorta.  There was  no gradient across the aortic valve.  Next, the pigtail catheter was pulled  out over the wire sheath, aspirated, and flushed.   FINDINGS:  1. Left ventricle showed good left ventricular systolic function, EF of 50-     55%.  2. Left main was patent.  3. Left anterior descending artery has 10-15% mid stenosis.  Diagonal one     and diagonal two were small which were patent.  4. Left circumflex was small which was patent which tapers down and is     diffusely diseased in atrioventricular groove after giving off obtuse     marginal one.  Obtuse marginal one was medium sized and has 20-30% in-    stent ostial restenosis with TIMI grade III distal flow in the mid     portion.  This  branch bifurcates into superior and inferior branch.  5. Right coronary artery has a 20-30% proximal stenosis and 30-40% mid     stenosis.  The right coronary artery had catheter-induced spasm in the     proximal portion which resolved after intracoronary nitroglycerin.  DISPOSITION:  The patient tolerated procedure well.  There were no  complications.  The patient was transferred to recovery room in stable  condition.                                               Eduardo Osier. Sharyn Leach, M.D.    MNH/MEDQ  D:  10/06/2002  T:  10/07/2002  Job:  [id number]   cc:   Cath Lab

## 2010-07-21 NOTE — Discharge Summary (Signed)
[place]. [facility]  Patient:    [name], [name]                    MRN: [id number] Adm. Date:  [id number] Disc. Date: [id number] Attending:  Robynn Pane CC:         Eino Farber., M.D.   Discharge Summary  ADMISSION DIAGNOSES: 1. Acute inferior posterolateral wall myocardial infarction. 2. Hypercholesterolemia. 3. Tobacco abuse. 4. Positive family history of coronary artery disease.  FINAL DIAGNOSES: 1. Status post acute inferior posterolateral wall myocardial infarction,    status post percutaneous transluminal coronary angioplasty and stenting to    obtuse marginal 1. 2. Hypercholesterolemia. 3. History of diverticulosis. 4. Tobacco abuse. 5. Positive family history of coronary artery disease.  DISCHARGE HOME MEDICATIONS: 1. Enteric-coated aspirin 325 mg one tablet daily. 2. Plavix 75 mg one tablet daily with food for one month. 3. Protonix 40 mg one tablet daily for one month. 4. Toprol XL 50 mg one tablet daily. 5. Altace 2.5 mg one capsule daily. 6. Lipitor 20 mg one tablet daily. 7. Nitrostat 0.4 mg sublingual to use as directed as needed.  ACTIVITY:  Avoid heavy lifting, pushing, or pulling.  DIET:  Low salt, low cholesterol.  SPECIAL INSTRUCTIONS:  Post angioplasty and stent instructions have been given.  The patient has been scheduled for phase 2 cardiac rehabilitation.  FOLLOW-UP:  Follow up with me in one week and Eino Farber., M.D., in two weeks.  CONDITION ON DISCHARGE:  Stable.  BRIEF HISTORY:  Mr. [name] is a 55 year old black male with a past medical history significant for hypercholesterolemia, tobacco abuse, and positive family history of coronary artery disease.  He came to the ER via EMS, complaining of recurrent retrosternal chest pain off and on since yesterday morning, lasting 10-15 minutes.  Today while driving home from his fathers home, he developed severe chest pressure, grade  10/10, radiating to the left arm associated with diaphoresis, nausea, and vomiting.  EKG done in the ER showed ST elevation in inferolateral leads and ST depression in V1 and V2 and aVF, suggestive of inferior posterolateral wall injury pattern.  The patient received baby aspirin and sublingual nitroglycerin by EMS and was started on IV nitroglycerin and heparin and received 300 mg of Plavix and 4 mg of morphine with partial relief of chest pain.  The patient denies prior episodes of anginal pain.  Denies palpitations, lightheadedness, or syncope.  Denies PND, orthopnea, or leg swelling.  Denies fever, chills, or cough.  Denies any history of drug abuse.  PAST MEDICAL HISTORY:  As above.  PAST SURGICAL HISTORY:  He had right wrist ganglion surgery x 2.  He had right foot surgery many years ago.  MEDICATIONS: 1. Lipitor 20 mg p.o. q.d. 2. Atarax 25 mg p.o. q.d.  ALLERGIES:  No known drug allergies.  SOCIAL HISTORY:  He is married.  He worked for [employer].  [place]  [place]  Smokes one pack per day for 30 years.  No history of alcohol abuse.  FAMILY HISTORY:  Father is healthy and in good health.  Mother is diabetic. His two uncles died of MI in their 69s.  PHYSICAL EXAMINATION:  He was alert, awake, and oriented x 3.  Complaining of severe chest pain.  VITAL SIGNS:  The patient blood pressure was 106/60 and the pulse was 68 and regular.  HEENT:  The conjunctivae were pink.  PERRLA.  NECK:  Supple.  No JVD.  No bruits.  LUNGS:  Clear to auscultation without rhonchi or rales.  CARDIOVASCULAR:  S1 and S2 were normal.  There was a soft S4 gallop at the apex.  ABDOMEN:  Soft.  Bowel sounds were present and nontender.  EXTREMITIES:  There was no clubbing, cyanosis, or edema.  LABORATORY DATA:  The chest x-ray on June 06, 2000, showed no evidence of acute disease.  A repeat x-ray done on June 06, 2000, showed  pulmonary vascular congestion.  A repeat x-ray done on June 10, 2000, showed no active disease in the chest.  His cholesterol was 190, triglycerides 47, HDL 41, and LDL 150.  His first set of CPKs was 78 with MB of 2.8.  The second set of CPK 2021 with MB of 577 and relative index 28.6.  The third set of CPK was 1046 with MB of 19.6 and relative index of 18.8.  The fourth set of CPK was 598 with MB of 17.7 and relative index of 11.8.  The fifth set of CPK was 131 with MB of 8.2. Troponins were 0.02 and then 87.47.  The third set was 44.13 and the fourth set was 32.24.  His sodium was 140, potassium 3.5, and chloride 105.  The glucose was 133.  The repeat glucose was 104.  BUN 13, creatinine 0.9.  The hemoglobin was 15.4, hematocrit 45.2, and white count 14.3.  On June 09, 2000, the hemoglobin was 13.5, hematocrit 39.9, and white count 10.  Repeat electrolytes on June 09, 2000, were sodium 138, potassium 4.1, chloride 105, bicarb 26, glucose 81, BUN 13, and creatinine 1.1.  The EKG done on June 06, 2000, showed normal sinus rhythm with ST elevation in 2, 3, aVF, V5, and V6 and ST depression in V1, V2, and aVL, suggestive of inferior posterolateral wall injury pattern.  A repeat EKG done post procedure showed normal sinus rhythm with nonspecific T-wave abnormality.  Subsequent EKGs remained the same.  BRIEF HOSPITAL COURSE:  The patient was discussed with the various options of treatment, i.e. thrombolytic therapy versus PCI and its risks and benefits, i.e. death, coma, stroke, need for emergency CABG, local vessel or complications, risks of restenosis, etc., and consented for the PCI.  The patient underwent emergency left cardiac catheterization with selective left and right coronary angiography, left ventriculography, and PTCA and stenting to obtuse marginal 1 without any complications as per interventional and catheterization report.  The patient tolerated the procedure well and  was  transferred to the recovery room in stable condition.  The patient had brief episodes of nonsustained asymptomatic ventricular tachycardia during the hospital stay.  The patient did not have any episodes of anginal pain during the hospital stay.  His CPKs and troponins gradually came towards normal as per laboratory report.  The patient has been ambulating in the hallway without any problems.  His groin is stable.  Phase 1 and phase 2 cardiac rehabilitation has been called.  The patient is scheduled for phase 2 cardiac rehabilitation.  DISPOSITION:  The patient will be discharged home on the above medications and will be followed up in my office in one week and with Eino Farber., M.D., in two weeks. DD:  06/11/00 TD:  06/11/00 Job: [id number]

## 2010-07-21 NOTE — Cardiovascular Report (Signed)
[place]. [facility]  Patient:    [name], [name] Visit Number: [id number] MRN: [id number]          Service Type: CAT Location: 3700 [id number] Attending Physician:  Robynn Pane Proc. Date: 01/16/01 Admit Date:  01/16/2001 Discharge Date: 01/17/2001   CC:         Eino Farber., M.D.   Cardiac Catheterization  PROCEDURE:  Left cardiac catheterization with selective left and right coronary angiography, LV graphy via right groin using Judkins technique. Successful PTCA to ostium and proximal obtuse marginal.  INDICATIONS:  Mr. Fussell is a 55 year old black male with past medical history significant for posterolateral wall myocardial infarction in April of 2002, status post PTCA and stenting of OM1 in April of 2002 and subsequently had restenosis at the proximal edge of the stent requiring PTCA and stenting in May of 2002.  Hypercholesterolemia, tobacco abuse, positive family history of coronary artery disease, history of diverticulosis, who came to the office complaining of recurrent retrosternal chest pain off and on associated with minimal exertion or with stress, relieved with one to two sublingual nitroglycerin.  He states chest pain is similar in nature when he had MI. Denies any nausea, vomiting, diaphoresis.  Denies PND, orthopnea, or leg swelling.  Denies rest or nocturnal angina.  The patient underwent stress Cardiolite on January 10, 2001, which showed recurrent inferolateral wall ischemia with EF of 52%.  PAST MEDICAL HISTORY:  As above.  PAST SURGICAL HISTORY:  He had right wrist ganglion surgery x 2 many years ago. He had right foot surgery many years ago.  ALLERGIES:  No known drug allergies.  MEDICATIONS:  He takes Toprol XL 50 mg p.o. q.d., Altace 2.5 mg p.o. q.d., aspirin 325 mg one tablet daily, Plavix 75 mg p.o. q.d., Lipitor 20 mg p.o. q.d., Nitrostat 0.4 mg sublingual as directed.  SOCIAL HISTORY:  He is  married.  He works for [employer].  Born in [place], [place] and lives in [place].  He smoked one pack per day for 30+ years.  No history of alcohol abuse.  FAMILY HISTORY:  Mother is alive.  She is diabetic.  Father is in good health. He has two uncles who died of coronary artery disease in their 49s.  PHYSICAL EXAMINATION:  GENERAL: He is alert and oriented x 3 in no acute distress.  VITAL SIGNS:  Blood pressure 114/78, pulse 68 regular.  HEENT: Conjunctivae pink.  NECK: Supple with no JVD and no bruit.  LUNGS: Clear to auscultation without rhonchi or rales.  HEART: S1 and S2 normal. There was no S3 gallop.  ABDOMEN: Soft.  Bowel sounds present.  Nontender.  EXTREMITIES: No clubbing, cyanosis, or edema.  EKG showed normal sinus rhythm with minor ST depression in inferolateral leads and old posterior wall MI.  IMPRESSION:  Accelerated angina, positive stress Cardiolite, rule out restenosis, coronary artery disease, status post posterolateral wall myocardial infarction, hypercholesterolemia, history of tobacco abuse, positive family history of coronary artery disease.  I discussed with the patient at length regarding various options of treatment, ie, medical versus surgical, risks and benefits, and consented for PCI.  DESCRIPTION OF PROCEDURE:  After obtaining the informed consent, the patient was brought to the catheterization lab and was placed on fluoroscopy table. The right groin was prepped and draped in the usual fashion.  2% Xylocaine was used for local anesthesia in the right groin.  With the help of thin wall needle, 6 French arterial sheath  was placed.  The sheath was aspirated and flushed. Next 6 French left Judkins catheter was advanced over the wire under fluoroscopic guidance up to the ascending aorta.  The wire was pulled out. The catheter was aspirated and connected to the manifold.  The catheter was further advanced and engaged into the left coronary  ostium.  Multiple views of the left system were taken.  Next, the catheter was disengaged and was pulled out over the wire and was replaced with 6 French right Judkins catheter which was advanced over the wire under fluoroscopic guidance up to the ascending aorta.  The wire was pulled out.  The catheter was aspirated and connected to the manifold.  The catheter was further advanced and engaged into the right coronary ostium.  Multiple views of the right system was taken.  Next, the catheter was disengaged and was pulled out over the wire and was replaced with 6 French pigtail catheter which was advanced over the wire under fluoroscopic guidance up to the ascending aorta.  The wire was pulled out.  The catheter was aspirated and connected to the manifold.  The catheter was further advanced across the aortic valve into the LV.  LV pressures were recorded. Next, LV graphy was done in 30 degree RAO position.  Postangiographic pressures were recorded from LV and then pullback pressures were recorded from the aorta.  There was no gradient across the aortic valve.  Next, the pigtail catheter was pulled out over the wire.  The sheaths were aspirated and flushed.  FINDINGS:  LV showed good LV systolic function with EF of 50 to 55%.  Left main was patent.  LAD has 10 to 15% proximal and mid stenosis.  Diagonal 1 to diagonal 3 were small which were patent.  Left circumflex was small which was patent.  OM1 was medium size and has 85% focal stenosis at the proximal edge of the stent close to ostium of the obtuse marginal 1.  RCA has multiple 20 to 30% proximal and mid stenosis.  INTERVENTIONAL PROCEDURE:  Successful PTCA to proximal and ostial OM1 was done using 2.5 x 6 mm long cutting balloon and then 2.75 x 6 mm long cutting balloon.  Multiple inflations were done and the lesion dilated from 85% to less than 10% result with excellent Timi grade 3 distal flow without evidence of dissection or  distal embolization.  The patient received weight-based  heparin and Integrilin during the procedure.  The patient tolerated the procedure well.  There were no complications.  The patient was transferred to recovery room in stable condition.  Attending Physician:  Robynn Pane DD:  01/17/01 TD:  01/17/01 Job: [id number]

## 2010-07-21 NOTE — Cardiovascular Report (Signed)
[place]. [facility]  Patient:    [name], [name]                    MRN: [id number] Proc. Date: 07/17/00 Adm. Date:  [id number] Disc. Date: [id number] Attending:  Robynn Pane CC:         Eino Farber., M.D.  Dorise Hiss, M.D.  Cardiac Catheterization Laboratory   Cardiac Catheterization  PROCEDURE PERFORMED: 1. Left cardiac catheterization with selective left and right coronary    angiography and LV-graphy via right groin using Judkins technique. 2. Successful deployment of 2.5 mm x 8 mm long BX Velocity stent in proximal    obtuse marginal #1 artery.  INDICATIONS:  [name] is a 55 year old black male with past medical history significant for posterolateral wall myocardial infarction and status post PTCA and stenting to OM-1 in April 2002, hypercholesterolemia, tobacco abuse, positive family history of coronary artery disease, history of diverticulosis, and questionable rectal bleeding recently.  He came to the ER complaining of recurrent retrosternal chest pain since Saturday afternoon, off and on.  He took one sublingual nitroglycerin on Saturday and Sunday with relief.  Today, also, after coming from cardiac rehabilitation, he complained of retrosternal chest pain grade 3-5/10.  He took three sublingual nitroglycerin with partial relief.  The patient states the pain felt similar to when he had MI.  The pain radiated to left shoulder without any nausea or vomiting, but he felt diaphoretic.  No shortness of breath.  He denies palpation, lightheadedness, or syncope.  No history of PND, orthopnea, or leg swelling.  No further episodes of GI bleeding.  The patient was admitted to the telemetry unit and MI was ruled out by serial enzymes and EKG.  The patient underwent stress Cardiolite exercise for 11.15 minutes on Bruce protocol.  He complained of fatigue and shortness of breath. No chest pain was reported.  Peak heart rate  achieved was 159 and peak blood pressure was 140/92.  EKG showed normal sinus rhythm, old posterior wall MI, T-wave inversion in inferolateral leads at baseline.  There was less than 1 mm further ST depression noted in inferolateral leads which reverted to baseline in the recovery phase. No arrhythmias were noted.  Cardiolite scan showed inferolateral wall ischemia with ejection fraction of 50%.  Due to recurrent chest pain, positive stress Cardiolite, and multiple risk factors, the patient was advised for catheterization and possible angioplasty  DESCRIPTION OF PROCEDURE:  After obtaining the informed consent, the patient was brought to the cardiac catheterization laboratory and was placed on the fluoroscopy table.  The right groin was prepped and draped in the usual fashion.  Two percent Xylocaine was used for local anesthesia in the right groin.  With the help of a thin-wall needle, a 6-French arterial sheath was placed. The sheath was aspirated and flushed.  Next, a 6-French left Judkins catheter was advanced over the wire under fluoroscopic guidance up to the ascending aorta.  The wire was pulled out, the catheter aspirated, and connected to manifold.  The catheter was further advanced and engaged into the left coronary ostium.  Multiple views of the left system were taken.  Next, the catheter was disengaged and was pulled out over the wire and was replaced with a 6-French right Judkins catheter which was advanced over the wire under fluoroscopic up to the ascending aorta.  The wire was pulled, the catheter was aspirated, and connected to the manifold.  The catheter was  further advanced and engaged into right coronary ostium.  Multiple views of the right system were taken.  Next, the catheter was disengaged and was pulled out over the wire and was replaced with a 6-French pigtail catheter, which was advanced over the wire under fluoroscopic guidance up to the ascending aorta.   The catheter was further advanced across the aortic valve and into the LV.  LV pressures were recorded.  Left LV-graphy was done in the 30 degree RAO position.  Postangiographic pressures were recorded from LV and pullback pressures were recorded from the aorta.  There was no gradient across the aortic valve.  Next, the pigtail catheter was pulled out, sheaths were aspirated, and flushed.  FINDINGS: 1. LV showed mild global hypokinesia with ejection fraction of 45% to 50%.  2. Left main has minimal ostial plaquing.  3. The left anterior descending coronary artery was patent.  Diagonal-1 and    diagonal-2 are patent.  4. Left circumflex was small, which was patent.  Obtuse marginal-1 had 75%    proximal stenosis before the proximal edge of the stent.  The stent in the    proximal OM-1 was patent.  5. Right coronary artery has mild disease.  INTERVENTIONAL PROCEDURE:  Successful primary stenting was done to the proximal OM-1 using 2.5. x 8 mm long BX Velocity stent which was deployed at 10 atm which was fully expanding, going up to 15 atm.  The lesion dilated from 75% to 0% residual with excellent TIMI-3 distal flow without evidence of dissection, distal embolization, local thrombosis, or compromising flow in other vessels.  The patient received weight-based heparin, ReoPro, and 150 mg of Plavix during the procedure.  The patient tolerated the procedure well and there were no complications.  The patient was transferred to the recovery room in stable condition.DD:  07/18/00 TD:  07/18/00 Job: [id number]

## 2010-07-21 NOTE — Cardiovascular Report (Signed)
NAMERASHEED, WELTY             ACCOUNT NO.:  [id number]   MEDICAL RECORD NO.:  [id number]          PATIENT TYPE:  OIB   LOCATION:  6522                         FACILITY:  [facility]   PHYSICIAN:  Madaline Savage, M.D.DATE OF BIRTH:  September 13, 1955   DATE OF PROCEDURE:  08/14/2004  DATE OF DISCHARGE:                              CARDIAC CATHETERIZATION   PROCEDURES PERFORMED:  Outpatient percutaneous coronary intervention of the  right coronary artery and selective visualization of the left coronary  artery and the right coronary artery.   COMPLICATIONS:  None.   ENTRY SITE:  Right femoral.   DYE USED:  Omnipaque.   PREMEDICATION GIVEN:  Prednisone and Pepcid.   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  He had a catheterization at the [facility] last week and has been loaded on Plavix as an outpatient and  enters the catheterization laboratory today for outpatient percutaneous of  stenting planned for the right coronary artery and also possibly for the in-  flow portion of the stent to the obtuse marginal branch #1 or intermediate  ramus as it is alternatively called.   PROCEDURE:  __________ was performed via the right percutaneous femoral  approach with 6-French guide catheters by [employer].  The right coronary guide  catheter did not fit the vessel well so I used a hockey-stick guide catheter  without side holes.  It caused some wire bias in the proximal vessel after  the a Whisper wire crossed the lesion but I was able to work around wire  bias and it was 100% resolved after the case was completed.  Once the  Whisper wire was then placed I direct stented with a 2.75 x 13 mm TAXUS  stent.  I deployed it to 16 atmospheres peak inflation pressure.  There were  two  inflations.  Both inflations brought ST-segment elevations and chest  pain more severe than the patient has had as an outpatient.  Preservation of  the distal blood flow was preserved, TIMI III before and TIMI III after.  The 75% area of stenosis in the mid RCA just before acute marginal branch  was completely resolved from 75% to 0.   I then took a coronary angiogram of the left main coronary artery using a 6-  [place] XB catheter specifically intended to select out the circumflex.  I  made a flush injection and to my surprise the area of the in-flow haziness  into the stent was completely resolved.  It possibly may be related to the  use of Plavix the last week.  I did not intervene on that stent since it  looked patent.  That is a 2.5 x 12 or 13 bare metal stent placed June 06, 2000 by Dr. Sharyn Lull.   The patient did have some residual chest pain following stenting.  I  therefore started IV nitro and planned to use it  for approximately four  hours in case there is any spasm of small vessels that may have occurred  with stenting.  The patient's EKG looks mildly abnormal as it did prior to  his procedure today compared to the EKG at the [facility]  There is no significant interval change.   FINAL DIAGNOSIS:  Successful percutaneous stenting of the right coronary  artery which was felt to be the culprit vessel responsible for recent stable  angina bordering on unstable angina.       WHG/MEDQ  D:  08/14/2004  T:  08/14/2004  Job:  [id number]   cc:   Candyce Churn. Allyne Gee, M.D.  [address]  [address]  [place]  [place] [address]  Fax: [phone]   Cath Lab

## 2010-07-21 NOTE — Discharge Summary (Signed)
[place]. [facility]  Patient:    [name], [name]                    MRN: [id number] Adm. Date:  [id number] Disc. Date: [id number] Attending:  Robynn Pane CC:         Eino Farber., M.D.  Sharyn Dross., M.D.   Discharge Summary  ADMISSION DIAGNOSES: 1. Recurrent chest pain, rule out restenosis, rule out myocardial infarction. 2. Coronary artery disease, status post posterolateral wall myocardial    infarction. 3. Hypercholesterolemia. 4. History of tobacco abuse. 5. Family history of coronary artery disease. 6. History of diverticulosis, diverticulitis. 7. History of recent gastrointestinal bleed.  FINAL DIAGNOSES: 1. Unstable angina, positive stress Cardiolite, status post left    catheterization and stenting to obtuse marginal 1. 2. Coronary artery disease, status post posterolateral wall myocardial    infarction. 3. Hypercholesterolemia. 4. History of tobacco abuse. 5. Positive family history of coronary artery disease. 6. History of diverticulosis, diverticulitis. 7. History of recent gastrointestinal bleed.  DISCHARGE HOME MEDICATIONS: 1. Baby aspirin 81 mg 1 tablet daily. 2. Plavix 75 mg 1 tablet daily with food. 3. Altace 2.5 mg 1 capsule daily. 4. Toprol XL 50 mg 1 tablet daily. 5. Lipitor 20 mg 1 tablet daily. 6. Protonix 40 mg 1 tablet twice daily. 7. Wellbutrin as before. 8. Atarax as before. 9. Nitrostat 0.4 mg sublingual used as directed.  DISCHARGE INSTRUCTIONS:  The patient has been advised to avoid heavy lifting, pushing, or pulling for 48 hours.  Postangioplasty and stent instructions have been given.  The patient has been advised to watch for signs of bleeding while on Plavix and aspirin.  DIET:  Low salt, low cholesterol.  FOLLOW-UP:  The patient has been advised to follow up closely with Dr. Dortha Kern from GI standpoint.  Follow up with me in one week, and with his primary doctor, Dr. Petra Kuba,  as scheduled.  CONDITION ON DISCHARGE:  Stable.  HISTORY OF PRESENT ILLNESS:  [name] is a 55 year old black male with past medical history significant for posterolateral wall myocardial infarction, status post PTCA and stenting to obtuse marginal 1 in April 2002, hypercholesterolemia, tobacco abuse, positive family history of coronary artery disease, history of diverticulosis, diverticulitis, and history of recent rectal bleeding.  He came to the ER complaining of recurrent retrosternal chest pain since Saturday afternoon off and on.  Took one sublingual nitroglycerin on Saturday and Sunday, with relief.  Today after coming from cardiac rehabilitation, complained of retrosternal chest pressure, grade 3-5/10.  Took a total of three sublingual nitroglycerin with partial relief.  The patient states the pain felt similar when he had MI.  The pain radiated to left shoulder, without nausea or vomiting, but felt diaphoretic. No shortness of breath, dizziness, palpitations, lightheadedness, or syncope. No history of PND, orthopnea, leg swelling.  No further episodes of GI bleeding.  PAST MEDICAL HISTORY:  As above.  PAST SURGICAL HISTORY: 1. Right wrist surgery x 2. 2. Right foot surgery in the past.  SOCIAL HISTORY:  He is married.  Works for [employer].  Born in [place], lives in [place].  Smoked one pack per day for 30 years.  Quit after MI last month.  No history of alcohol or drug abuse.  FAMILY HISTORY:  Father is alive, in good health.  Mother is diabetic.  Two uncles died of MI in their 91s and 51s.  MEDICATIONS: 1. Baby aspirin 81 mg  p.o. q.d. 2. Altace 2.5 mg p.o. q.d. 3. Toprol 50 mg 1 tablet p.o. q.d. 4. Lipitor 20 mg p.o. q.d. 5. Protonix 40 mg p.o. q.d. 6. Nitrostat sublingual p.r.n. 7. Wellbutrin 150 b.i.d. 8. Atarax as needed.  PHYSICAL EXAMINATION:  GENERAL:  The patient is alert, awake, oriented x 3.  In no acute distress.  VITAL  SIGNS:  Blood pressure 104/70, pulse 64.  HEENT:  Conjunctivae pink.  NECK:  Supple.  No JVD, no bruit.  LUNGS:  Clear to auscultation.  Without rhonchi or rales.  CARDIOVASCULAR:  S1, S2 normal.  There was no S3, gallop.  ABDOMEN:  Soft.  Bowel sounds are present.  Nontender.  EXTREMITIES:  There was no clubbing, cyanosis, or edema.  LABORATORY DATA:  EKG showed normal sinus rhythm, old posterior wall MI, T-wave inversion in lateral leads.  Stress Cardiolite showed inferolateral wall ischemia with ejection fraction of 50%.  His cholesterol was 135, triglycerides 75, HDL 37, LDL 83.  Two sets of cardiac enzymes were negative:  CK was 83, MB of 1.4; second set CK 46, MB of 2.1.  His troponins were 0.02 and 0.05.  Repeat CPK postprocedure was 41.  His hemoglobin was 12.6, hematocrit 36.8, white count of 8.8.  Sodium was 140, potassium 5.1, chloride 106, glucose 102, BUN 17, creatinine 1.2.  This morning hemoglobin was 12.1, hematocrit 35.1, white count 11.4.  Potassium 4, BUN 8, creatinine 1.0.  HOSPITAL COURSE:  The patient was admitted to telemetry unit.  MI was ruled out by serial enzymes and EKG.  The patient underwent stress Cardiolite on Jul 16, 2000, exercised on Bruce protocol for 11.15 minutes, achieved peak heart rate of 159, peak blood pressure of 140/92.  EKG showed normal sinus rhythm with old posterior wall MI, T-wave inversion in inferolateral leads at baseline with less than 1 mm.  Further ST depression was noted in inferolateral leads which reverted towards baseline in recovery phase.  No cardiac arrhythmias were noted.  Cardiolite scan showed inferolateral wall ischemia with ejection fraction of 50%.  The patient had two episodes of chest pain described as substernal chest tightness, grade 6/10, which resolved with sublingual nitroglycerin.  Due to recurrent chest pain and positive stress  Cardiolite, the patient underwent left catheterization and stenting to OM  1, as per catheterization and PTCA report.  The patient tolerated the procedure well.  There were no complications.  The patient was transferred to elective angioplasty unit on Jul 17, 2000.  His sheaths were pulled out the same day of the procedure.  The patient has been ambulating in the hallway without any problem.  There is no evidence of hematoma.  The patient did not have any further episodes of chest pain since the procedure.  Postprocedure CPKs have been negative.  The patient did not have any episodes of bleeding during the hospital stay.  The patient has been advised to watch for signs of bleeding while he is on aspirin and Plavix and should follow up with GI closely.  The patient will be discharged home today and will be followed up in my office in one week and with Dr. Petra Kuba and Dr. Tad Moore as scheduled. DD:  07/18/00 TD:  07/19/00 Job: [id number]

## 2010-07-21 NOTE — Cardiovascular Report (Signed)
[place]. [facility]  Patient:    [name], [name]                    MRN: [id number] Proc. Date: 06/06/00 Adm. Date:  [id number] Attending:  Robynn Leach CC:         [name] Cath Lab             [name]., M.D.                        Cardiac Catheterization  PROCEDURE: 1. Emergency left cardiac catheterization with selective left and right    coronary angiographies, left ventriculography via right groin using Judkins    technique. 2. Successful percutaneous transluminal coronary angioplasty to proximal    obtuse marginal #1 using 2.5 x 15.0-mm long CrossSail balloon. 3. Successful deployment of Bx Velocity stent, 2.5 x 13.0 mm long, in proximal    obtuse marginal #1.  INDICATION FOR THE PROCEDURE:  Mr. [name] is a 55 year old black male with a past medical history significant for hypercholesterolemia, tobacco abuse, positive family history of coronary artery disease, who came to the ER via EMS complaining of recurrent retrosternal chest pain off and on since yesterday morning, lasting 10 to 15 minutes.  Today, while driving home from fathers house, developed severe chest pressure, grade 10/10, radiating to the left arm, associated with diaphoresis, nausea and vomiting.  EKG done in the ER showed normal sinus rhythm with ST elevation in inferolateral leads and ST depression in V1, V2 and aVL, suggestive of inferoposterolateral injury pattern.  Patient received baby aspirin, two sublingual nitroglycerin by EMS and was started on IV nitroglycerin and heparin and received MS 4 mg and Plavix 300 mg with partial relief of chest pain.  Patient denies prior episodes of anginal pain.  Denies palpitations, light-headedness or syncope. Denies PND, orthopnea or leg swelling.  Denies fever, chills, cough.  Denies any drug abuse.  PAST MEDICAL HISTORY:  As above.  PAST SURGICAL HISTORY:  He had right wrist ganglion surgery x 2.  He had  right foot surgery in the past.  MEDICATIONS:  He was on Lipitor 20 mg p.o. q.d. and Atarax 25 mg p.o. q.d.  ALLERGIES:  No known drug allergies.  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  Smokes one pack per day for 30 years.  No history of alcohol abuse.  Used to smoke marijuana many years ago.  No history of cocaine abuse.  PAST FAMILY HISTORY:  Two of his uncles died of MIs in their 36s.  Father is healthy.  Mother is diabetic.  PHYSICAL EXAMINATION:  GENERAL:  On examination, he was alert, awake, oriented x 3, complaining of severe chest pain, grade 8/10.  VITAL SIGNS:  Blood pressure was 106/60, pulse of 68, regular.  HEENT:  Conjunctivae were pink.  PERRLA.  NECK:  Supple.  No JVD.  No bruit.  LUNGS:  Lungs are clear to auscultation without rhonchi or rales.  CARDIOVASCULAR:  S1, S2 were normal.  There was a soft S4 gallop at the apex.  ABDOMEN:  Abdomen was soft, bowel sounds are present, nontender.  EXTREMITIES:  There is no clubbing, cyanosis, or edema.  LABORATORY DATA:  His labs are pending.  EKG:  Normal sinus rhythm with ST elevation in inferolateral leads and ST depression in V1, V2 and aVL suggestive of  acute inferoposterolateral wall injury pattern.  Discussed with patient and his father regarding various options of treatment, i.e., thrombolytic therapy versus emergency cath, possible PTCA and stenting, its risks and benefits, i.e., death, MI, stroke, need for emergency CABG, local vascular complications, etc, and consented for the procedure.  DESCRIPTION OF PROCEDURE:  After obtaining the informed consent, patient was brought to the cath lab and was placed on fluoroscopy table.  Right groin was prepped and draped in usual fashion.  Xylocaine 2% was used for local anesthesia in the right groin.  With the help of a thin-walled needle, 7-French arterial and 6-French venous  sheaths were placed.  Both of the sheaths were aspirated and flushed.  Next, a 6-French left Judkins catheter was advanced over the wire under fluoroscopic guidance up to the ascending aorta.  Wire was pulled out.  The catheter was aspirated and connected to the manifold.  Catheter was further advanced and engaged into left coronary ostium.  Multiple views of the left system were taken.  Next, catheter was disengaged and was pulled out over the wire and was replaced with a 6-French right Judkins catheter which was advanced over the wire under fluoroscopic guidance up to the ascending aorta.  Wire was pulled out.  The catheter was aspirated and connected to the manifold.  Catheter was further advanced and engaged into right coronary ostium.  Multiple views of the right system were taken.  Next, 6-French left pigtail catheter was advanced over the wire under fluoroscopic guidance up to the ascending aorta.  Wire was pulled out.  The catheter was aspirated and connected to the manifold.  Catheter was further advanced across the aortic valve into the LV.  LV pressures were recorded. Next, left ventriculography was done in 30 degree RAO position. Post-angiographic pressures were recorded from LV and then pullback pressures were recorded from the aorta.  There was no gradient across the aortic valve. Next, the pigtail catheter was pulled out.  Sheaths were aspirated and flushed.  FINDINGS:  LV showed good systolic function.  There was catheter-induced 2+ MR.  Left main was patent.  LAD had 5-10% proximal stenosis and 10-15% midstenosis.  Diagonal #1 was very, very small, which was patent. Diagonal #2 and #3 were medium size, which were patent.  Left circumflex was small, which is patent.  OM-1 was 100% occluded proximally.  Ostia had 20-25% sequential lesions in the midportion with no collaterals supplying the left system.  INTERVENTIONAL PROCEDURE:  Successful PTCA to OM-1 was done using 2.5  x 15.0-mm long CrossSail balloon for pre-dilatation and then  2.5 x 13.0-mm long Bx Velocity stent deployed at 10 atmospheric pressure, which was fully expanded, going up to 15 atmospheric pressure, lesion dilated from 100% to 0% residual, with excellent TIMI grade 3 distal flow, without evidence of dissection or distal embolization.  Patient received weight-based heparin, ReoPro and Plavix 300 mg during the procedure.  Patient tolerated procedure well.  There were no complications.  Patient was transferred to recovery room in stable condition. DD:  06/06/00 TD:  06/07/00 Job: [id number]

## 2010-07-21 NOTE — Discharge Summary (Signed)
[name], [name]             ACCOUNT NO.:  [id number]   MEDICAL RECORD NO.:  [id number]          PATIENT TYPE:  OBV   LOCATION:  5741                         FACILITY:  [facility]   PHYSICIAN:  Hillery Aldo, M.D.   DATE OF BIRTH:  November 16, 1955   DATE OF ADMISSION:  09/21/2004  DATE OF DISCHARGE:  09/26/2004                                 DISCHARGE SUMMARY   DISCHARGE DIAGNOSES:  1.  Prostatitis.  2.  Diverticulitis.  3.  Diarrhea.  4.  Hypokalemia.  5.  Coronary artery disease.  6.  Hypertension.  7.  Gastroesophageal reflux disease.  8.  Seasonal allergies.   DISCHARGE MEDICATIONS:  1.  Ciprofloxacin 500 mg b.i.d. x10 days.  2.  Flagyl 500 mg q.i.d. x10 days.  3.  Plavix 75 mg daily.  4.  Imdur 60 mg daily.  5.  Protonix 40 mg daily.  6.  Hydroxyzine 25 mg b.i.d.  7.  Toprol XL 25 mg daily.  8.  Aspirin 162 mg daily.  9.  Allegra 180 mg daily as needed.  10. Vytorin 10/80 p.o. daily.  11. Diovan 80 mg daily.  12. Flomax 0.4 mg daily.   CONSULTATIONS:  None.   DISCHARGE LABORATORY DATA:  C-difficile toxin was negative. Magnesium was  2.0. Sodium was 140, potassium 3.9, chloride 109, bicarb 26, glucose 126,  BUN 6, creatinine 1.2, calcium 8.1. CBC showed a white blood cell count of  6.0, hemoglobin 11.9, hematocrit 34.4, platelets 232,000. Fecal occult blood  testing was negative. PSA was 115.58. Blood cultures were negative. Urine  culture grew greater than 100,000 colonies of e-coli, ciprofloxacin  sensitive.   PROCEDURE:  The patient underwent CT scanning of the pelvis and abdomen on  September 21, 2004, which showed mild acute sigmoid diverticulitis.   HOSPITAL COURSE BY PROBLEM:  1.  PROSTATITIS WITH URINARY TRACT INFECTION:  The patient completed 4 days      of intravenous ciprofloxacin with normalization of his white blood cell      count and defervescence. He was put on Flomax to improve his urine      outflow. He will continue on a total course of 2 weeks of  ciprofloxacin,      given his concurrent diverticulitis.   1.  DIVERTICULITIS:  The patient underwent CT scanning with the results as      noted above. Given his active diverticulitis, Flagyl was added on      hospital day 2. He quickly defervesced with normalization of his white      blood cell count. His appetite resumed to normal. His abdominal pain      resolved.   1.  DIARRHEA:  Resolved with intravenous fluids and treatment of his      underlying diverticulitis.   1.  HYPOKALEMIA:  Secondary to GI losses. Magnesium was checked and found to      be within normal limits. His electrolytes remained stable throughout the      course of his hospitalization.   1.  CORONARY ARTERY DISEASE:  The patient was maintained on his usual dose  of Plavix, Statin therapy, and beta blocker therapy. He did not have any      active chest pain during the course of his hospitalization.   1.  HYPERTENSION:  The patient's blood pressure was controlled with his      usual home medication regimen.   DISPOSITION:  The patient is discharged to home.   FOLLOW UP:  He should followup with Dr. Allyne Gee, his primary care physician,  in approximately 2 weeks time. He was instructed to call his primary care  physician for any resumption of fever, diarrhea, or abdominal pain.   DIET/ACTIVITY:  He can resume a normal diet and increase his activity as  tolerated.       CR/MEDQ  D:  09/26/2004  T:  09/26/2004  Job:  [id number]   cc:   Candyce Churn. Allyne Gee, M.D.  [address]  [address]  [place]  [place] [address]  Fax: [phone]

## 2010-08-09 ENCOUNTER — Emergency Department (HOSPITAL_COMMUNITY): Payer: 59

## 2010-08-09 ENCOUNTER — Observation Stay (HOSPITAL_COMMUNITY)
Admission: EM | Admit: 2010-08-09 | Discharge: 2010-08-11 | Disposition: A | Discharge status: Home / Self Care | Payer: 59 | Attending: Cardiology | Admitting: Cardiology

## 2010-08-09 DIAGNOSIS — E785 Hyperlipidemia, unspecified: Secondary | ICD-10-CM | POA: Insufficient documentation

## 2010-08-09 DIAGNOSIS — E119 Type 2 diabetes mellitus without complications: Secondary | ICD-10-CM | POA: Insufficient documentation

## 2010-08-09 DIAGNOSIS — I472 Ventricular tachycardia, unspecified: Secondary | ICD-10-CM | POA: Insufficient documentation

## 2010-08-09 DIAGNOSIS — I10 Essential (primary) hypertension: Secondary | ICD-10-CM | POA: Insufficient documentation

## 2010-08-09 DIAGNOSIS — R079 Chest pain, unspecified: Principal | ICD-10-CM | POA: Insufficient documentation

## 2010-08-09 DIAGNOSIS — Z9861 Coronary angioplasty status: Secondary | ICD-10-CM | POA: Insufficient documentation

## 2010-08-09 DIAGNOSIS — Z01812 Encounter for preprocedural laboratory examination: Secondary | ICD-10-CM | POA: Insufficient documentation

## 2010-08-09 DIAGNOSIS — I2510 Atherosclerotic heart disease of native coronary artery without angina pectoris: Secondary | ICD-10-CM | POA: Insufficient documentation

## 2010-08-09 DIAGNOSIS — I4729 Other ventricular tachycardia: Secondary | ICD-10-CM | POA: Insufficient documentation

## 2010-08-09 DIAGNOSIS — Z8546 Personal history of malignant neoplasm of prostate: Secondary | ICD-10-CM | POA: Insufficient documentation

## 2010-08-09 LAB — COMPREHENSIVE METABOLIC PANEL
Albumin: 3.9 g/dL (ref 3.5–5.2)
Albumin: 3.8 g/dL (ref 3.5–5.2)
Alkaline Phosphatase: 47 U/L (ref 39–117)
Alkaline Phosphatase: 47 U/L (ref 39–117)
BUN: 19 mg/dL (ref 6–23)
BUN: 18 mg/dL (ref 6–23)
Calcium: 8.9 mg/dL (ref 8.4–10.5)
Chloride: 105 mEq/L (ref 96–112)
Creatinine, Ser: 0.96 mg/dL (ref 0.4–1.5)
Creatinine, Ser: 0.93 mg/dL (ref 0.4–1.5)
Glucose, Bld: 104 mg/dL — ABNORMAL HIGH (ref 70–99)
Glucose, Bld: 104 mg/dL — ABNORMAL HIGH (ref 70–99)
Potassium: 4.1 mEq/L (ref 3.5–5.1)
Potassium: 4.4 mEq/L (ref 3.5–5.1)
Total Bilirubin: 0.3 mg/dL (ref 0.3–1.2)
Total Protein: 6.8 g/dL (ref 6.0–8.3)
Total Protein: 6.7 g/dL (ref 6.0–8.3)

## 2010-08-09 LAB — GLUCOSE, CAPILLARY
Glucose-Capillary: 102 mg/dL — ABNORMAL HIGH (ref 70–99)
Glucose-Capillary: 134 mg/dL — ABNORMAL HIGH (ref 70–99)

## 2010-08-09 LAB — CARDIAC PANEL(CRET KIN+CKTOT+MB+TROPI)
CK, MB: 1.9 ng/mL (ref 0.3–4.0)
Relative Index: 1.6 (ref 0.0–2.5)
Relative Index: 3.5 — ABNORMAL HIGH (ref 0.0–2.5)
Total CK: 195 U/L (ref 7–232)
Troponin I: <0.3 ng/mL (ref <0.30)
Troponin I: 0.66 ng/mL (ref <0.30)

## 2010-08-09 LAB — CBC
HCT: 41.3 % (ref 39.0–52.0)
Hemoglobin: 14.6 g/dL (ref 13.0–17.0)
Hemoglobin: 14.5 g/dL (ref 13.0–17.0)
MCH: 31.3 pg (ref 26.0–34.0)
MCHC: 35.1 g/dL (ref 30.0–36.0)
MCV: 89 fL (ref 78.0–100.0)
RBC: 4.64 MIL/uL (ref 4.22–5.81)
WBC: 14.2 10*3/uL — ABNORMAL HIGH (ref 4.0–10.5)

## 2010-08-09 LAB — HEMOGLOBIN A1C: Hgb A1c MFr Bld: 6.2 % — ABNORMAL HIGH (ref <5.7)

## 2010-08-09 LAB — POCT I-STAT, CHEM 8
Chloride: 107 mEq/L (ref 96–112)
Creatinine, Ser: 1.1 mg/dL (ref 0.4–1.5)
Hemoglobin: 15.3 g/dL (ref 13.0–17.0)
Potassium: 4.2 mEq/L (ref 3.5–5.1)
Sodium: 141 mEq/L (ref 135–145)

## 2010-08-09 LAB — CK TOTAL AND CKMB (NOT AT ARMC)
CK, MB: 2.5 ng/mL (ref 0.3–4.0)
Relative Index: 1.9 (ref 0.0–2.5)
Total CK: 135 U/L (ref 7–232)

## 2010-08-09 LAB — LIPID PANEL
Cholesterol: 136 mg/dL (ref 0–200)
LDL Cholesterol: 84 mg/dL (ref 0–99)
Triglycerides: 58 mg/dL (ref <150)
VLDL: 12 mg/dL (ref 0–40)

## 2010-08-09 LAB — URINALYSIS, DIPSTICK ONLY
Glucose, UA: NEGATIVE mg/dL
Hgb urine dipstick: NEGATIVE
Ketones, ur: NEGATIVE mg/dL
Leukocytes, UA: NEGATIVE
Protein, ur: NEGATIVE mg/dL
Urobilinogen, UA: 0.2 mg/dL (ref 0.0–1.0)

## 2010-08-09 LAB — TROPONIN I: Troponin I: 0.32 ng/mL (ref <0.30)

## 2010-08-09 LAB — PROTIME-INR
INR: 1.08 (ref 0.00–1.49)
Prothrombin Time: 14.2 seconds (ref 11.6–15.2)

## 2010-08-09 LAB — D-DIMER, QUANTITATIVE: D-Dimer, Quant: <0.22 ug/mL-FEU (ref 0.00–0.48)

## 2010-08-09 LAB — TSH: TSH: 0.393 u[IU]/mL (ref 0.350–4.500)

## 2010-08-10 LAB — CARDIAC PANEL(CRET KIN+CKTOT+MB+TROPI)
CK, MB: 12.8 ng/mL (ref 0.3–4.0)
CK, MB: 14.2 ng/mL (ref 0.3–4.0)
CK, MB: 8.8 ng/mL (ref 0.3–4.0)
Relative Index: 4.6 — ABNORMAL HIGH (ref 0.0–2.5)
Relative Index: 2.7 — ABNORMAL HIGH (ref 0.0–2.5)
Total CK: 318 U/L — ABNORMAL HIGH (ref 7–232)
Troponin I: 2.4 ng/mL (ref <0.30)
Troponin I: 2.15 ng/mL (ref <0.30)

## 2010-08-10 LAB — CBC
Hemoglobin: 14.5 g/dL (ref 13.0–17.0)
MCH: 31.7 pg (ref 26.0–34.0)
MCHC: 35.9 g/dL (ref 30.0–36.0)
Platelets: 285 10*3/uL (ref 150–400)
RBC: 4.58 MIL/uL (ref 4.22–5.81)
WBC: 12.2 10*3/uL — ABNORMAL HIGH (ref 4.0–10.5)

## 2010-08-10 LAB — BASIC METABOLIC PANEL
CO2: 26 mEq/L (ref 19–32)
Calcium: 8.5 mg/dL (ref 8.4–10.5)
GFR calc Af Amer: >60 mL/min (ref >60)
Glucose, Bld: 135 mg/dL — ABNORMAL HIGH (ref 70–99)
Potassium: 4.1 mEq/L (ref 3.5–5.1)
Sodium: 137 mEq/L (ref 135–145)

## 2010-08-10 LAB — GLUCOSE, CAPILLARY
Glucose-Capillary: 109 mg/dL — ABNORMAL HIGH (ref 70–99)
Glucose-Capillary: 100 mg/dL — ABNORMAL HIGH (ref 70–99)

## 2010-08-11 LAB — CARDIAC PANEL(CRET KIN+CKTOT+MB+TROPI)
CK, MB: 4.4 ng/mL — ABNORMAL HIGH (ref 0.3–4.0)
Troponin I: 1.09 ng/mL (ref <0.30)

## 2010-08-19 NOTE — Cardiovascular Report (Signed)
[name], HERN             ACCOUNT NO.:  [id number]  MEDICAL RECORD NO.:  [id number]  LOCATION:  6523                         FACILITY:  [facility]  PHYSICIAN:  Thereasa Solo. Little, M.D. DATE OF BIRTH:  05-03-1955  DATE OF PROCEDURE:  08/09/2010 DATE OF DISCHARGE:                           CARDIAC CATHETERIZATION   PROCEDURE:  Cardiac catheterization.  OPERATOR:  Thereasa Solo. Little, MD  INDICATIONS FOR TEST:  This 55 year old male has known prior coronary disease with a stent in his RCA and a stent in his proximal circumflex. He presented to the emergency room at [facility] after developing chest pain about 20 minutes prior to his arrival.  He had worked out for the first time very aggressively.  Prior to the onset of his chest pain in the emergency room, his EKG showed minor ST-segment elevations.  He was classified as a STEMI.  His cardiac markers were all still pending.  He has a contrast media allergy which included hives.  Because of this, he was premedicated with 125 mg of IV Solu-Medrol, 20 mg of IV Pepcid, and 50 mg of IV Benadryl.  TECHNIQUE:  After obtaining informed consent, the patient was prepped and draped in the usual sterile fashion exposing the right groin. Following local anesthetic with 1% Xylocaine, the Seldinger technique was employed and the 6-French introducer sheath was placed in the right femoral artery.  Right and left coronary arteriography and ventriculography in the RAO projection was performed.  TOTAL CONTRAST USED:  120 mL.  COMPLICATIONS:  None.  EQUIPMENT:  A 6-French diagnostic Judkins catheters, but a JL-5 cm curved 6-French catheter was needed.  RESULTS: 1. Hemodynamic monitoring:  Central aortic pressure was 108/64.  Left     ventricular pressure was 118/5 with no significant valve gradient     at the time of pullback. 2. Right coronary artery:  On fluoroscopy, there was a stent in the     midportion of the right coronary artery.   There was proximal 23%     irregularities in the RCA.  There was 20-30% eccentric narrowing     within the stent itself in the mid RCA and the PDA and 3     posterolateral vessels were all small and free of disease.  There     was clearly no flow-limiting lesions in the right coronary artery. 3. Left main:  Normal and bifurcated. 4. Circumflex:  The circumflex in the AV groove was small.  Coming off     very close to the left main was the first OM which was a large     vessel that bifurcated.  In the proximal portion of this, there was     a stent that was widely patent and the lower limb of the     bifurcation had a focal 40% area of narrowing. 5. LAD:  The LAD crossed the apex of the heart, had some mid scattered     30-40% areas of irregularity.  There were two diagonals with minor     ostial narrowing.  Ventriculography:  Ventriculography in the RAO projection with 25 mL of contrast at 12 mL per second revealed an ejection fraction of 45-50% with no  focal wall motion abnormalities.  End-diastolic pressure was 16.  This is clearly not an acute myocardial infarction.  There is no high- grade stenosis.  The patient will be probably ready for home in the morning.  Unfortunately, all his labs are still pending at this time including his cardiac markers and his D-dimer.  Of note, he had no reaction from the contrast media.          ______________________________ Thereasa Solo. Little, M.D.     ABL/MEDQ  D:  08/09/2010  T:  08/10/2010  Job:  [id number]  cc:   Ritta Slot, MD Catheterization Laboratory  Electronically Signed by Julieanne Manson M.D. on 08/19/2010 11:00:12 AM

## 2010-08-26 ENCOUNTER — Emergency Department (HOSPITAL_COMMUNITY): Payer: 59

## 2010-08-26 ENCOUNTER — Inpatient Hospital Stay (HOSPITAL_COMMUNITY)
Admission: EM | Admit: 2010-08-26 | Discharge: 2010-08-28 | DRG: 287 | Disposition: A | Discharge status: Home / Self Care | Payer: 59 | Attending: Cardiovascular Disease | Admitting: Cardiovascular Disease

## 2010-08-26 DIAGNOSIS — R6889 Other general symptoms and signs: Secondary | ICD-10-CM | POA: Diagnosis present

## 2010-08-26 DIAGNOSIS — A4902 Methicillin resistant Staphylococcus aureus infection, unspecified site: Secondary | ICD-10-CM | POA: Diagnosis present

## 2010-08-26 DIAGNOSIS — I252 Old myocardial infarction: Secondary | ICD-10-CM

## 2010-08-26 DIAGNOSIS — E785 Hyperlipidemia, unspecified: Secondary | ICD-10-CM | POA: Diagnosis present

## 2010-08-26 DIAGNOSIS — L02818 Cutaneous abscess of other sites: Secondary | ICD-10-CM | POA: Diagnosis present

## 2010-08-26 DIAGNOSIS — I10 Essential (primary) hypertension: Secondary | ICD-10-CM | POA: Diagnosis present

## 2010-08-26 DIAGNOSIS — I200 Unstable angina: Secondary | ICD-10-CM | POA: Diagnosis present

## 2010-08-26 DIAGNOSIS — I2510 Atherosclerotic heart disease of native coronary artery without angina pectoris: Principal | ICD-10-CM | POA: Diagnosis present

## 2010-08-26 DIAGNOSIS — E109 Type 1 diabetes mellitus without complications: Secondary | ICD-10-CM | POA: Diagnosis present

## 2010-08-26 DIAGNOSIS — Z9079 Acquired absence of other genital organ(s): Secondary | ICD-10-CM

## 2010-08-26 DIAGNOSIS — Z8546 Personal history of malignant neoplasm of prostate: Secondary | ICD-10-CM

## 2010-08-26 LAB — PRO B NATRIURETIC PEPTIDE: Pro B Natriuretic peptide (BNP): 60.1 pg/mL (ref 0–125)

## 2010-08-26 LAB — GLUCOSE, CAPILLARY: Glucose-Capillary: 105 mg/dL — ABNORMAL HIGH (ref 70–99)

## 2010-08-26 LAB — COMPREHENSIVE METABOLIC PANEL
ALT: 23 U/L (ref 0–53)
AST: 19 U/L (ref 0–37)
Albumin: 3.5 g/dL (ref 3.5–5.2)
Calcium: 8.8 mg/dL (ref 8.4–10.5)
GFR calc Af Amer: >60 mL/min (ref >60)
Potassium: 3.7 mEq/L (ref 3.5–5.1)
Sodium: 140 mEq/L (ref 135–145)
Total Protein: 6.5 g/dL (ref 6.0–8.3)

## 2010-08-26 LAB — CK TOTAL AND CKMB (NOT AT ARMC)
CK, MB: 1.8 ng/mL (ref 0.3–4.0)
Relative Index: INVALID (ref 0.0–2.5)

## 2010-08-26 LAB — CBC
Hemoglobin: 13.5 g/dL (ref 13.0–17.0)
MCH: 31.6 pg (ref 26.0–34.0)
MCHC: 36 g/dL (ref 30.0–36.0)
MCV: 87.8 fL (ref 78.0–100.0)
RBC: 4.27 MIL/uL (ref 4.22–5.81)

## 2010-08-27 LAB — BASIC METABOLIC PANEL
BUN: 16 mg/dL (ref 6–23)
Calcium: 8.4 mg/dL (ref 8.4–10.5)
GFR calc Af Amer: >60 mL/min (ref >60)
GFR calc non Af Amer: >60 mL/min (ref >60)
Glucose, Bld: 105 mg/dL — ABNORMAL HIGH (ref 70–99)
Potassium: 3.9 mEq/L (ref 3.5–5.1)
Sodium: 139 mEq/L (ref 135–145)

## 2010-08-27 LAB — GLUCOSE, CAPILLARY: Glucose-Capillary: 112 mg/dL — ABNORMAL HIGH (ref 70–99)

## 2010-08-27 LAB — CBC
MCHC: 35.9 g/dL (ref 30.0–36.0)
Platelets: 261 10*3/uL (ref 150–400)
RDW: 13.4 % (ref 11.5–15.5)
WBC: 8.7 10*3/uL (ref 4.0–10.5)

## 2010-08-27 LAB — LIPID PANEL
Cholesterol: 108 mg/dL (ref 0–200)
HDL: 37 mg/dL — ABNORMAL LOW (ref >39)
LDL Cholesterol: 61 mg/dL (ref 0–99)
Total CHOL/HDL Ratio: 2.9 RATIO
Triglycerides: 49 mg/dL (ref <150)
VLDL: 10 mg/dL (ref 0–40)

## 2010-08-27 LAB — CARDIAC PANEL(CRET KIN+CKTOT+MB+TROPI)
CK, MB: 1.6 ng/mL (ref 0.3–4.0)
CK, MB: 1.6 ng/mL (ref 0.3–4.0)
Total CK: 80 U/L (ref 7–232)
Total CK: 84 U/L (ref 7–232)
Troponin I: <0.3 ng/mL (ref <0.30)
Troponin I: <0.3 ng/mL (ref <0.30)

## 2010-08-28 LAB — LIPID PANEL
HDL: 43 mg/dL (ref >39)
LDL Cholesterol: 69 mg/dL (ref 0–99)
Total CHOL/HDL Ratio: 2.9 RATIO
VLDL: 12 mg/dL (ref 0–40)

## 2010-08-28 LAB — POCT ACTIVATED CLOTTING TIME: Activated Clotting Time: 100 seconds

## 2010-08-28 LAB — CBC
HCT: 38.2 % — ABNORMAL LOW (ref 39.0–52.0)
MCH: 31.4 pg (ref 26.0–34.0)
MCV: 88.2 fL (ref 78.0–100.0)
Platelets: 270 10*3/uL (ref 150–400)
RBC: 4.33 MIL/uL (ref 4.22–5.81)

## 2010-08-28 LAB — GLUCOSE, CAPILLARY
Glucose-Capillary: 90 mg/dL (ref 70–99)
Glucose-Capillary: 95 mg/dL (ref 70–99)
Glucose-Capillary: 154 mg/dL — ABNORMAL HIGH (ref 70–99)

## 2010-08-29 NOTE — Discharge Summary (Signed)
[name], [name]             ACCOUNT NO.:  [id number]  MEDICAL RECORD NO.:  [id number]  LOCATION:  6523                         FACILITY:  [facility]  PHYSICIAN:  Nanetta Batty, M.D.   DATE OF BIRTH:  16-Apr-1955  DATE OF ADMISSION:  08/09/2010 DATE OF DISCHARGE:  08/11/2010                              DISCHARGE SUMMARY   DISCHARGE DIAGNOSES: 1. Non-ST-elevation myocardial infarction.  Peak troponin of 2.40,     status post left heart catheterization on August 09, 2010, diffuse     nonobstructive coronary artery disease.  Ejection fraction 45-50%. 2. Coronary artery disease, status post PCI in 2002 to the circumflex,     drug-eluting stent to the RCA in 2006.  Last cath prior to this     admission was in May 2010. 3. Hypertension. 4. Dyslipidemia. 5. Type 2 diabetes mellitus. 6. History of prostate cancer in 2006. 7. Nonsustained ventricular tachycardia.  HOSPITAL COURSE:  [name] is a 55 year old male with history of coronary artery disease, status post stent to the circumflex in 2002 and stent to the RCA in 2006, caths in 2009 and 2010 showed patent stents. History also includes hypertension, dyslipidemia, prostate cancer status post surgery in 2006, diabetes mellitus type 2, dyslipidemia.  Presented to the ED via EMS, after walking out with his son developed substernal chest pain on the way home.  Pain was across his shoulders and associated with nausea.  EKG showed inferior ST elevation.  The patient was taken emergently to the Cath Lab as a STEMI.  Left heart cath showed:  Coronary artery with proximal 23% irregularities, there was a 20-30% eccentric narrowing within the stent in the mid RCA.  The PDA and 3 posterior lateral vessels were all small and free of disease.  There was no flow-limiting lesion in the right coronary artery.  Left main was normal and bifurcated.  Circumflex in the AV groove was small.  Coming off very close to the left main was the first OM which  was a large vessel and bifurcated.  In the proximal portion, the stent was widely patent and lower limb of the bifurcation had focal 40% area of narrowing.  The LAD crossed the apex of the heart, had mid scattered 30- 40% areas of irregularity.  There were 2 diagonals with minor osteal narrowing.  Ejection fraction was 45%-50%.  No high-grade stenosis noted.  The night of August 09, 2010, the patient had a 15 beats run of V- tach followed by 7 sinus beats and 5 more beats of V-tach.  The patient was asymptomatic and aware of the rhythm sinus.  Continued without chest pain or shortness of breath.  His metoprolol was increased from 12.5 mg b.i.d. to 25 mg b.i.d..  Cardiac enzymes came back with troponin peak of 2.4.  It was cycled another 3 times and showed steady decrease.  The patient has been seen by Dr. Allyson Sabal, who feels stable for discharge home with follow up 2-D echocardiogram.  Also, note, the patient had no further episodes of nonsustained ventricular tachycardia.  DISCHARGE LABS:  WBC is 12.2, hemoglobin 14.5, hematocrit 40.4, platelets 285.  Sodium 137, potassium 4.1, chloride 103, carbon dioxide 25, glucose 135,  BUN 16, creatinine 0.88, peak troponin was 2.40, peak CK-MB 14.2, last troponin was 1.09 and the last CK-MB was 4.4.  Total cholesterol was 136, triglycerides 58, HDL 40, LDL 84, VLDL 12 and total cholesterol HDL ration was 3.4.  TSH was 0.393.  Urinalysis is within normal limits.  STUDIES/PROCEDURES: 1. Cardiac catheterization on August 09, 2010, results, central aortic     pressure was 108/64.  Left ventricular pressure was 118/5 with no     significant valve gradient at the time of pullback.  Right coronary     artery:  There was a stent in the mid portion of the right coronary     artery.  There was proximal 23% irregularities in the RCA.  There     was 20-30% eccentric narrowing within the stent itself in the mid     RCA.  The PDA and 3 posterolateral vessels were all  small and free     of disease.  There was clearly no flow-limiting lesions in the     right coronary artery. 2. Left main was normal and bifurcated.  Circumflex in the AV groove     was small.  Coming off very close to the left main was the first OM     which was a large vessel that bifurcated.  In the mid portion of     this, there was a stent that was widely patent and the lower limb     of the bifurcation had a focal 40% area of narrowing. 3. The LAD crossed the apex of the heart, had some mid scattered 30-     40% areas of irregularity.  There were two diagonals with minor     ostial narrowing.  Ventriculography in the RAO projection with 25     mL of contrast at 12 mL per second revealed an ejection fraction of     45-50% with no focal wall motion abnormalities.  End-diastolic     pressure was 16 mmHg.  There was clearly no acute myocardial     infarction and no high-grade stenosis.  DISCHARGE MEDICATIONS: 1. Amlodipine 2.5 mg 1 tablet by mouth daily at bedtime. 2. Metoprolol XL succinate 25 mg 1 tablet by mouth daily. 3. Nitroglycerin sublingual 0.4 mg 1 tablet under the tongue every 5     minutes up to 3 doses for chest pain. 4. Aspirin 81 mg tablet by mouth daily. 5. Diovan 80 mg 1 tablet by mouth daily 6. Enablex 15 mg 1 tablet by mouth daily. 7. Fish oil 1200 mg 1 capsule by mouth daily. 8. Flaxseed oil over the counter 30 mL by mouth daily. 9. Hydroxyzine 25 mg 2 tablets by mouth daily. 10.Nexium 40 mg 2 capsules by mouth daily. 11.Vitamin D3 2000 four tablets by mouth daily. 12.Vytorin 10/80 one tablet by mouth daily. 13.Calcium 600 mg with vitamin D 400 units 2 tablets by mouth daily.  DISPOSITION:  [name] was discharged home in stable condition. Should increase his activity slowly, shower and bathe.  No lifting for 2 days, no driving for 2 days.  Catheter site becomes red, painful swollen or discharges fluid or pus, he is to call our office immediately,  and follow up with [name] in approximately 1-2 weeks and our office call him with the appointment time.    ______________________________ Wilburt Finlay, PA   ______________________________ Nanetta Batty, M.D.    BH/MEDQ  D:  08/11/2010  T:  08/12/2010  Job:  [id number]  Electronically  Signed by Wilburt Finlay PA on 08/24/2010 03:04:23 PM Electronically Signed by Nanetta Batty M.D. on 08/29/2010 09:22:08 AM

## 2010-09-04 NOTE — Discharge Summary (Signed)
[name], [name]             ACCOUNT NO.:  [id number]  MEDICAL RECORD NO.:  [id number]  LOCATION:  2021                         FACILITY:  [facility]  PHYSICIAN:  Nicki Guadalajara, M.D.     DATE OF BIRTH:  28-Aug-1955  DATE OF ADMISSION:  08/26/2010 DATE OF DISCHARGE:  08/28/2010                              DISCHARGE SUMMARY   DISCHARGE DIAGNOSES: 1. Chest pain, negative myocardial infarction; possible coronary spasm     during cardiac catheterization, improved with intracoronary     nitroglycerin. 2. Coronary artery disease with history of stent from 2006, otherwise     nonobstructive disease previously and cath on this admission with     20-30% RCA stenosis, 20% LAD stenosis, patent stent to the RCA, and     patent circumflex stent. 3. Diabetes mellitus type 1. 4. Hypertension treated and controlled. 5. History of prostate cancer. 6. Recent methicillin-resistant staph aureus of the scalp with lesions     that have been I and D'd as an outpatient with Dr. Terri Piedra, treated     with antibiotics both oral and superficial.  DISCHARGE CONDITION:  Improved.  PROCEDURES:  August 28, 2010, combined left heart cath by Dr. Tresa Endo with nonobstructive coronary artery disease, patent stents that had been previously placed and EF of 55%.  PHYSICAL EXAMINATION:  VITAL SIGNS:  At discharge, blood pressure 114/78, pulse 70, respirations 18, temperature 98.1, oxygen saturation 99% on 2 L. HEART:  Regular rate and rhythm. LUNGS:  Clear. ABDOMEN:  Positive bowel sounds. EXTREMITIES: No edema.  2+ pedals.  HOSPITAL COURSE:  The patient was admitted on August 26, 2010 after presenting to the emergency room with chest pain for approximately 5-6 days.  He had taken nitroglycerin sublingual several times throughout the week, with some relief after around 10 minutes.  He had stopped at [facility] on Friday to get nitroglycerin spray and a prescription for Imdur.  The pain was  described as left-sided pectoral region to shoulders, at the worst described 5/10 on a 1-10 scale and by the time he was seen in the ER was 2/10 and a dull discomfort.  He was positive for nausea, some shortness of breath but no diaphoresis, vomiting, or palpitations.  Medical history includes prostatectomy, sustained V-tach, dyslipidemia, hypertension, and diabetes.  PREVIOUS INTERVENTIONS:  He had a stent to the circumflex in 2002 and stent to the RCA in 2006.  In 2012, he presented with a non-ST-elevation MI with no obstruction in his coronary arteries.  Possibly related to coronary spasm with today's results of the cardiac catheterization.  Recently, the patient had been followed by Dr. Terri Piedra, his dermatologist for head lesions/abscess.  They were I and D'd and positive for MRSA. He is on Cipro orally as well as Bactroban ointment.  The patient was admitted on August 26, 2010.  His beta-blocker was increased, nitrates were added, Toradol IV was given, as well as IV heparin was started. The patient stabilized.  His cardiac enzymes were negative and by August 28, 2010, he was ready to undergo cardiac cath, though on the evening of August 27, 2010, he did have an episode of chest pain, relieved with nitroglycerin, developed severe  headache and given morphine for the headache.  The patient underwent cardiac cath with Dr. Tresa Endo with results as stated.  Plan will be to discharge home once he ambulates if he has no further chest pain or problems.  DISCHARGE INSTRUCTIONS: 1. No work until August 31, 2010.  Increase activity slowly.  May     shower.  No lifting for 1 week.  No driving for 2 days. 2. Low-sodium heart-healthy diet. 3. Wash cath site with soap and water.  Call if any bleeding,     swelling, or drainage.  Follow up with Dr. Clarene Duke on September 14, 2010     at 9:20 a.m.  DISCHARGE MEDICATIONS:  See medication reconciliation sheet from [facility]. Imdur was added and metoprolol XL was  increased.  The patient will ambulate in the hallway prior to discharge.  As long as he does not have any further problems, he will be discharged on August 28, 2010.     Darcella Gasman. Annie Paras, N.P.   ______________________________ Nicki Guadalajara, M.D.    LRI/MEDQ  D:  08/28/2010  T:  08/29/2010  Job:  [id number]  Electronically Signed by Nada Boozer N.P. on 08/29/2010 02:52:13 PM Electronically Signed by Nicki Guadalajara M.D. on 09/04/2010 04:29:48 PM

## 2010-09-04 NOTE — Cardiovascular Report (Signed)
NAMEHARUO, Leach             ACCOUNT NO.:  [id number]  MEDICAL RECORD NO.:  [id number]  LOCATION:  2021                         FACILITY:  [facility]  PHYSICIAN:  Nicki Guadalajara, M.D.     DATE OF BIRTH:  [birth year]  DATE OF PROCEDURE:  08/28/2010 DATE OF DISCHARGE:  08/28/2010                           CARDIAC CATHETERIZATION   PROCEDURE:  Cardiac catheterization.  SURGEON:  Nicki Guadalajara, MD  INDICATIONS:  [name] is a 55 year old African American gentleman who suffered a myocardial infarction in 2002 and underwent PCI and stenting to the circumflex marginal vessel.  In 2006, he underwent stenting to his right coronary artery.  He had recently been admitted to [facility] approximately3 weeks ago with chest pain and ruled in for non-ST-segment MI.  Repeat catheterization showed 45-50% ejection fraction with 20-30% narrowing in the RCA, 40% in the circumflex, and 30- 40% in the LAD.  He also had 15 beats of nonsustained VT noted during his hospitalization.  Over the past week, the patient has developed recurrent episodes of chest pain leading to his August 26, 2010, readmission to [facility].  The patient has had recurrent chest pain with nitrate responsiveness.  Repeat catheterization was recommended.  PROCEDURE:  After premedication with Versed 2 mg intravenously plus fentanyl 25 mcg, the patient was prepped and draped in the usual fashion.  His right femoral artery was punctured anteriorly and a 5- French sheath was inserted without difficulty.  Diagnostic catheterization was done utilizing 5-French Judkins #4 left and right coronary catheters.  200 mcg of intracoronary nitroglycerin was selectively administered down the right coronary artery.  A 5-French pigtail catheter was used for biplane cineventriculography.  Hemostasis was obtained by direct manual pressure.  The patient tolerated the procedure well and returned to his room in satisfactory  condition.  HEMODYNAMIC DATA:  Central aortic pressure was 100/70.  Left ventricular pressure 100/8.  Post A-wave 13.  ANGIOGRAPHIC DATA:  Left main coronary artery was angiographically normal and bifurcated into an LAD and left circumflex coronary artery.  The LAD was angiographically normal and gave rise to 2 major diagonal vessels.  There was minimal 20% narrowing in the region of the first septal perforator and diagonal takeoff.  The LAD gave rise to 2 diagonal vessels and was free of significant disease.  The circumflex vessel gave rise to a high marginal vessel which had a widely patent proximal stent.  The right coronary artery had a shepherd's crook takeoff.  There was 20- 30% smooth narrowing beyond the shepherd's crook takeoff proximally before the anterior RV marginal branch.  The RCA was dominant and supplied PDA, posterolateral, and inferolateral vessel.  Following IC nitroglycerin administration, there was slight improvement in the 30% RCA smooth narrowing.  Biplane cineventriculography revealed preserved global contractility with an ejection fraction of 55%.  There was very minimal focal mid diaphragmatic hypocontractility on the RAO projection and very subtle minimal hypocontractility in mid posterolateral wall on the LAO projection concordant with the patient's remote left circumflex MI.  IMPRESSION: 1. Preserved global left ventricular function with minimal focal     midinferior and posterolateral hypocontractility with an ejection     fraction of 55%.  2. No significant coronary obstructive disease with mild 20% narrowing     of the proximal left anterior descending in the region of a     diagonal septal perforating artery takeoff. 3. Widely patent stent in the high circumflex marginal branch without     evidence for restenosis. 4. Large dominant right coronary artery with shepherd's crook takeoff     with smooth 20-30% narrowing just beyond the shepherd's  crook     region on the proximal to mid segment prior to the previously     placed mid right coronary artery stent which was widely patent.     The 30% narrowing did improve following intracoronary nitroglycerin     administration. 5. Biplane cineventriculography revealed preserved global left     ventricular contractility with an ejection fraction of 55% with     minimal focal midinferior and mid posterolateral hypocontractility     and the patient is status post remote left circumflex myocardial     infarction in 2002.  IMPRESSION: 1. Preserved global LV function with ejection fraction 55% with a very     minimal focal region of hypocontractility in the midinferior and     mid posterolateral wall. 2. No significant coronary artery obstructive disease with mild 20%     narrowing in the proximal left anterior descending in the region of     the septal diagonal takeoff. 3. Widely patent left circumflex marginal stent. 4. Widely patent mid right coronary artery stent with smooth 20-30%     narrowing in the right coronary artery proximal to the stented     segment, which did improve with intracoronary nitroglycerin     administration and a large dominant right coronary artery with     shepherd's crook takeoff.  RECOMMENDATIONS:  Medical therapy with continued nitrate treatment.          ______________________________ Nicki Guadalajara, M.D.     TK/MEDQ  D:  08/28/2010  T:  08/29/2010  Job:  [id number]  Electronically Signed by Nicki Guadalajara M.D. on 09/04/2010 04:54:09 PM

## 2010-10-02 ENCOUNTER — Encounter (HOSPITAL_COMMUNITY): Payer: 59

## 2010-10-04 ENCOUNTER — Encounter (HOSPITAL_COMMUNITY)
Admission: RE | Admit: 2010-10-04 | Discharge: 2010-10-04 | Disposition: A | Discharge status: Home / Self Care | Payer: 59 | Source: Ambulatory Visit | Attending: Interventional Cardiology | Admitting: Interventional Cardiology

## 2010-10-04 DIAGNOSIS — E109 Type 1 diabetes mellitus without complications: Secondary | ICD-10-CM | POA: Insufficient documentation

## 2010-10-04 DIAGNOSIS — Z9861 Coronary angioplasty status: Secondary | ICD-10-CM | POA: Insufficient documentation

## 2010-10-04 DIAGNOSIS — R079 Chest pain, unspecified: Secondary | ICD-10-CM | POA: Insufficient documentation

## 2010-10-04 DIAGNOSIS — I10 Essential (primary) hypertension: Secondary | ICD-10-CM | POA: Insufficient documentation

## 2010-10-04 DIAGNOSIS — Z5189 Encounter for other specified aftercare: Secondary | ICD-10-CM | POA: Insufficient documentation

## 2010-10-04 DIAGNOSIS — K219 Gastro-esophageal reflux disease without esophagitis: Secondary | ICD-10-CM | POA: Insufficient documentation

## 2010-10-04 DIAGNOSIS — I2510 Atherosclerotic heart disease of native coronary artery without angina pectoris: Secondary | ICD-10-CM | POA: Insufficient documentation

## 2010-10-04 DIAGNOSIS — I252 Old myocardial infarction: Secondary | ICD-10-CM | POA: Insufficient documentation

## 2010-10-06 ENCOUNTER — Encounter (HOSPITAL_COMMUNITY): Payer: 59

## 2010-10-06 ENCOUNTER — Other Ambulatory Visit: Payer: Self-pay | Admitting: Interventional Cardiology

## 2010-10-06 LAB — GLUCOSE, CAPILLARY: Glucose-Capillary: 97 mg/dL (ref 70–99)

## 2010-10-09 ENCOUNTER — Encounter (HOSPITAL_COMMUNITY): Payer: 59

## 2010-10-11 ENCOUNTER — Encounter (HOSPITAL_COMMUNITY): Payer: 59

## 2010-10-13 ENCOUNTER — Encounter (HOSPITAL_COMMUNITY): Payer: 59

## 2010-10-16 ENCOUNTER — Encounter (HOSPITAL_COMMUNITY): Payer: 59

## 2010-10-18 ENCOUNTER — Encounter (HOSPITAL_COMMUNITY): Payer: 59

## 2010-10-20 ENCOUNTER — Encounter (HOSPITAL_COMMUNITY): Payer: 59

## 2010-10-23 ENCOUNTER — Encounter (HOSPITAL_COMMUNITY): Payer: 59

## 2010-10-25 ENCOUNTER — Encounter (HOSPITAL_COMMUNITY): Payer: 59

## 2010-10-27 ENCOUNTER — Encounter (HOSPITAL_COMMUNITY): Payer: 59

## 2010-10-30 ENCOUNTER — Encounter (HOSPITAL_COMMUNITY): Payer: 59

## 2010-11-01 ENCOUNTER — Encounter (HOSPITAL_COMMUNITY): Payer: 59

## 2010-11-03 ENCOUNTER — Encounter (HOSPITAL_COMMUNITY): Payer: 59

## 2010-11-06 ENCOUNTER — Encounter (HOSPITAL_COMMUNITY): Payer: 59

## 2010-11-08 ENCOUNTER — Encounter (HOSPITAL_COMMUNITY): Payer: 59

## 2010-11-10 ENCOUNTER — Encounter (HOSPITAL_COMMUNITY): Payer: 59

## 2010-11-13 ENCOUNTER — Encounter (HOSPITAL_COMMUNITY): Payer: 59 | Attending: Interventional Cardiology

## 2010-11-13 DIAGNOSIS — K219 Gastro-esophageal reflux disease without esophagitis: Secondary | ICD-10-CM | POA: Insufficient documentation

## 2010-11-13 DIAGNOSIS — I10 Essential (primary) hypertension: Secondary | ICD-10-CM | POA: Insufficient documentation

## 2010-11-13 DIAGNOSIS — R079 Chest pain, unspecified: Secondary | ICD-10-CM | POA: Insufficient documentation

## 2010-11-13 DIAGNOSIS — E109 Type 1 diabetes mellitus without complications: Secondary | ICD-10-CM | POA: Insufficient documentation

## 2010-11-13 DIAGNOSIS — Z5189 Encounter for other specified aftercare: Secondary | ICD-10-CM | POA: Insufficient documentation

## 2010-11-13 DIAGNOSIS — Z9861 Coronary angioplasty status: Secondary | ICD-10-CM | POA: Insufficient documentation

## 2010-11-13 DIAGNOSIS — I2510 Atherosclerotic heart disease of native coronary artery without angina pectoris: Secondary | ICD-10-CM | POA: Insufficient documentation

## 2010-11-13 DIAGNOSIS — I252 Old myocardial infarction: Secondary | ICD-10-CM | POA: Insufficient documentation

## 2010-11-15 ENCOUNTER — Encounter (HOSPITAL_COMMUNITY): Payer: 59

## 2010-11-17 ENCOUNTER — Encounter (HOSPITAL_COMMUNITY): Payer: 59

## 2010-11-20 ENCOUNTER — Encounter (HOSPITAL_COMMUNITY): Payer: 59

## 2010-11-22 ENCOUNTER — Encounter (HOSPITAL_COMMUNITY): Payer: 59

## 2010-11-24 ENCOUNTER — Encounter (HOSPITAL_COMMUNITY): Payer: 59

## 2010-11-27 ENCOUNTER — Encounter (HOSPITAL_COMMUNITY): Payer: 59

## 2010-11-29 ENCOUNTER — Encounter (HOSPITAL_COMMUNITY): Payer: 59

## 2010-11-30 LAB — CK TOTAL AND CKMB (NOT AT ARMC)
CK, MB: 2.3
Relative Index: 1.5
Total CK: 149

## 2010-11-30 LAB — TROPONIN I: Troponin I: <0.01

## 2010-11-30 LAB — CBC
Hemoglobin: 14.2
RBC: 4.55
WBC: 9.4

## 2010-11-30 LAB — COMPREHENSIVE METABOLIC PANEL
ALT: 27
Albumin: 3.9
Alkaline Phosphatase: 59
Calcium: 8.8
GFR calc Af Amer: >60
Potassium: 3.7
Sodium: 135
Total Protein: 6.8

## 2010-11-30 LAB — TSH: TSH: 1.296 (ref 0.350–4.500)

## 2010-11-30 LAB — DIFFERENTIAL
Basophils Relative: 1
Eosinophils Absolute: 0.1
Lymphs Abs: 2.4
Monocytes Absolute: 0.7
Monocytes Relative: 7

## 2010-11-30 LAB — PROTIME-INR
INR: 1
Prothrombin Time: 13.3

## 2010-12-01 ENCOUNTER — Encounter (HOSPITAL_COMMUNITY): Payer: 59

## 2010-12-04 ENCOUNTER — Encounter (HOSPITAL_COMMUNITY): Payer: 59

## 2010-12-06 ENCOUNTER — Encounter (HOSPITAL_COMMUNITY): Payer: 59

## 2010-12-08 ENCOUNTER — Encounter (HOSPITAL_COMMUNITY): Payer: 59

## 2010-12-08 LAB — DIFFERENTIAL
Basophils Absolute: 0.1 10*3/uL (ref 0.0–0.1)
Basophils Relative: 1 % (ref 0–1)
Eosinophils Absolute: 0.2 10*3/uL (ref 0.0–0.7)
Eosinophils Relative: 3 % (ref 0–5)
Lymphocytes Relative: 36 % (ref 12–46)
Lymphs Abs: 2.9 10*3/uL (ref 0.7–4.0)
Monocytes Absolute: 0.8 10*3/uL (ref 0.1–1.0)
Monocytes Relative: 9 % (ref 3–12)
Neutro Abs: 4.1 10*3/uL (ref 1.7–7.7)
Neutrophils Relative %: 51 % (ref 43–77)

## 2010-12-08 LAB — COMPREHENSIVE METABOLIC PANEL
ALT: 24 U/L (ref 0–53)
AST: 25 U/L (ref 0–37)
Albumin: 3.8 g/dL (ref 3.5–5.2)
Alkaline Phosphatase: 59 U/L (ref 39–117)
BUN: 9 mg/dL (ref 6–23)
CO2: 24 mEq/L (ref 19–32)
Calcium: 9.3 mg/dL (ref 8.4–10.5)
Chloride: 107 mEq/L (ref 96–112)
Creatinine, Ser: 1.09 mg/dL (ref 0.4–1.5)
GFR calc Af Amer: >60 mL/min (ref >60)
GFR calc non Af Amer: >60 mL/min (ref >60)
Glucose, Bld: 117 mg/dL — ABNORMAL HIGH (ref 70–99)
Potassium: 4.5 mEq/L (ref 3.5–5.1)
Sodium: 139 mEq/L (ref 135–145)
Total Bilirubin: 0.5 mg/dL (ref 0.3–1.2)
Total Protein: 6.6 g/dL (ref 6.0–8.3)

## 2010-12-08 LAB — I-STAT 8, (EC8 V) (CONVERTED LAB)
BUN: 14
Bicarbonate: 24.7 — ABNORMAL HIGH
Chloride: 105
Glucose, Bld: 94
HCT: 47
Hemoglobin: 16
Operator id: 123881
Potassium: 3.7
Sodium: 139
TCO2: 26
pCO2, Ven: 40.7 — ABNORMAL LOW
pH, Ven: 7.392 — ABNORMAL HIGH

## 2010-12-08 LAB — CBC
HCT: 46.1 % (ref 39.0–52.0)
Hemoglobin: 15.3 g/dL (ref 13.0–17.0)
RBC: 4.83 MIL/uL (ref 4.22–5.81)
WBC: 8.1 10*3/uL (ref 4.0–10.5)

## 2010-12-11 ENCOUNTER — Encounter (HOSPITAL_COMMUNITY): Payer: 59

## 2010-12-13 ENCOUNTER — Encounter (HOSPITAL_COMMUNITY): Payer: 59

## 2010-12-15 ENCOUNTER — Encounter (HOSPITAL_COMMUNITY): Payer: 59

## 2010-12-18 ENCOUNTER — Encounter (HOSPITAL_COMMUNITY): Payer: 59

## 2010-12-20 ENCOUNTER — Encounter (HOSPITAL_COMMUNITY): Payer: 59

## 2010-12-22 ENCOUNTER — Encounter (HOSPITAL_COMMUNITY): Payer: 59

## 2010-12-25 ENCOUNTER — Encounter (HOSPITAL_COMMUNITY): Payer: 59

## 2010-12-27 ENCOUNTER — Encounter (HOSPITAL_COMMUNITY): Payer: 59

## 2010-12-29 ENCOUNTER — Encounter (HOSPITAL_COMMUNITY): Payer: 59

## 2011-01-01 ENCOUNTER — Encounter (HOSPITAL_COMMUNITY): Payer: 59

## 2011-01-03 ENCOUNTER — Encounter (HOSPITAL_COMMUNITY): Payer: 59

## 2011-01-05 ENCOUNTER — Encounter (HOSPITAL_COMMUNITY): Payer: 59

## 2011-06-30 ENCOUNTER — Encounter (HOSPITAL_COMMUNITY): Payer: Self-pay | Admitting: *Deleted

## 2011-06-30 ENCOUNTER — Emergency Department (HOSPITAL_COMMUNITY)
Admission: EM | Admit: 2011-06-30 | Discharge: 2011-07-01 | Disposition: A | Discharge status: Home / Self Care | Payer: 59 | Attending: Emergency Medicine | Admitting: Emergency Medicine

## 2011-06-30 ENCOUNTER — Emergency Department (HOSPITAL_COMMUNITY): Payer: 59

## 2011-06-30 DIAGNOSIS — I252 Old myocardial infarction: Secondary | ICD-10-CM | POA: Insufficient documentation

## 2011-06-30 DIAGNOSIS — S82843A Displaced bimalleolar fracture of unspecified lower leg, initial encounter for closed fracture: Secondary | ICD-10-CM | POA: Insufficient documentation

## 2011-06-30 DIAGNOSIS — S9306XA Dislocation of unspecified ankle joint, initial encounter: Secondary | ICD-10-CM | POA: Insufficient documentation

## 2011-06-30 DIAGNOSIS — S82899A Other fracture of unspecified lower leg, initial encounter for closed fracture: Secondary | ICD-10-CM

## 2011-06-30 DIAGNOSIS — Y9229 Other specified public building as the place of occurrence of the external cause: Secondary | ICD-10-CM | POA: Insufficient documentation

## 2011-06-30 DIAGNOSIS — W010XXA Fall on same level from slipping, tripping and stumbling without subsequent striking against object, initial encounter: Secondary | ICD-10-CM | POA: Insufficient documentation

## 2011-06-30 HISTORY — DX: Acute myocardial infarction, unspecified: I21.9

## 2011-06-30 LAB — CBC
HCT: 42.1 % (ref 39.0–52.0)
Hemoglobin: 14.9 g/dL (ref 13.0–17.0)
RBC: 4.66 MIL/uL (ref 4.22–5.81)

## 2011-06-30 LAB — BASIC METABOLIC PANEL
BUN: 14 mg/dL (ref 6–23)
CO2: 27 mEq/L (ref 19–32)
Chloride: 106 mEq/L (ref 96–112)
Glucose, Bld: 101 mg/dL — ABNORMAL HIGH (ref 70–99)
Potassium: 4 mEq/L (ref 3.5–5.1)

## 2011-06-30 LAB — DIFFERENTIAL
Lymphs Abs: 4 10*3/uL (ref 0.7–4.0)
Monocytes Relative: 11 % (ref 3–12)
Neutro Abs: 5.1 10*3/uL (ref 1.7–7.7)
Neutrophils Relative %: 49 % (ref 43–77)

## 2011-06-30 MED ORDER — PROPOFOL 10 MG/ML IV EMUL
INTRAVENOUS | Status: AC
  Administered 2011-06-30: 80 mg
  Filled 2011-06-30: qty 20

## 2011-06-30 MED ORDER — MORPHINE SULFATE 4 MG/ML IJ SOLN
4.0000 mg | Freq: Once | INTRAMUSCULAR | Status: AC
  Administered 2011-06-30: 4 mg via INTRAVENOUS
  Filled 2011-06-30: qty 1

## 2011-06-30 MED ORDER — SODIUM CHLORIDE 0.9 % IV SOLN
INTRAVENOUS | Status: AC | PRN
  Administered 2011-06-30: 50 mL/h via INTRAVENOUS

## 2011-06-30 MED ORDER — HYDROMORPHONE HCL PF 1 MG/ML IJ SOLN
1.0000 mg | Freq: Once | INTRAMUSCULAR | Status: AC
  Administered 2011-06-30: 1 mg via INTRAVENOUS
  Filled 2011-06-30: qty 1

## 2011-06-30 MED ORDER — ONDANSETRON HCL 4 MG/2ML IJ SOLN
4.0000 mg | Freq: Once | INTRAMUSCULAR | Status: AC
  Administered 2011-06-30: 4 mg via INTRAVENOUS
  Filled 2011-06-30: qty 2

## 2011-06-30 NOTE — ED Provider Notes (Signed)
History     CSN: [id number]  Arrival date & time 06/30/11  2020   First MD Initiated Contact with Patient 06/30/11 2100      Chief Complaint  Patient presents with  . Fall    HPI  History provided by the patient. Patient is a 56 year old male with past history of MI and cardiac stents, prostate cancer presents with right ankle and lower extremity injury. 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 Patient denies significant head injury or LOC. Patient complains of right ankle and lower leg pains or deformity. Patient was brought to ED by EMS and given pain medications in route. Pain has improved some but continues to hurt at rest. Patient denies any numbness or tingling to the foot. Patient able to wiggle and move toes.   Past Medical History  Diagnosis Date  . Myocardial infarction     Past Surgical History  Procedure Date  . Carotid stent     History reviewed. No pertinent family history.  History  Substance Use Topics  . Smoking status: Not on file  . Smokeless tobacco: Not on file  . Alcohol Use:       Review of Systems  HENT: Negative for neck pain.   Cardiovascular: Negative for chest pain.  Gastrointestinal: Negative for abdominal pain.  Neurological: Negative for dizziness, weakness, light-headedness, numbness and headaches.    Level V applies due to urgent need to intervene.  Allergies  Review of patient's allergies indicates no known allergies.  Home Medications  No current outpatient prescriptions on file.  BP 125/79  Pulse 80  Temp(Src) 98.6 F (37 C) (Axillary)  Resp 20  Ht 5\' 8"  (1.727 m)  Wt 199 lb (90.266 kg)  BMI 30.26 kg/m2  SpO2 96%  Physical Exam  Nursing note and vitals reviewed. Constitutional: He is oriented to person, place, and time. He appears well-developed and well-nourished. No distress.  HENT:  Head: Normocephalic and  atraumatic.       No battle sign or raccoon eyes. No signs of trauma.  Eyes: Conjunctivae and EOM are normal. Pupils are equal, round, and reactive to light.  Neck: Normal range of motion. Neck supple.       No cervical midline tenderness.  Cardiovascular: Normal rate and regular rhythm.   Pulmonary/Chest: Effort normal and breath sounds normal. No respiratory distress. He has no wheezes. He has no rales.  Abdominal: Soft. He exhibits no distension. There is no tenderness.  Musculoskeletal:       Gross for a right ankle and lower extremity. Normal dorsal pedal pulses. Normal movement of toes and cap refill. Normal sensations and foot. Mild proximal fibular tenderness. No deformities. Knee appears normal. All other extremities unremarkable. No pain along spine.  Neurological: He is alert and oriented to person, place, and time.  Skin: Skin is warm.  Psychiatric: He has a normal mood and affect. His behavior is normal.    ED Course  ORTHOPEDIC INJURY TREATMENT Date/Time: 06/30/2011 11:30 PM Performed by: Angus Seller Authorized by: Angus Seller Consent: Verbal consent obtained. Written consent obtained. Risks and benefits: risks, benefits and alternatives were discussed Consent given by: patient Patient understanding: patient states understanding of the procedure being performed Patient consent: the patient's understanding of the procedure matches consent given Relevant documents: relevant documents present and verified Site marked: the operative site was marked Imaging  studies: imaging studies available Patient identity confirmed: verbally with patient and arm band Time out: Immediately prior to procedure a "time out" was called to verify the correct patient, procedure, equipment, support staff and site/side marked as required. Injury location: ankle Location details: right ankle Injury type: fracture-dislocation Fracture type: bimalleolar Pre-procedure neurovascular assessment:  neurovascularly intact Patient sedated: yes Sedatives: propofol Vitals: Vital signs were monitored during sedation. Manipulation performed: yes Reduction successful: yes X-ray confirmed reduction: yes Immobilization: splint Splint type: short leg Post-procedure neurovascular assessment: post-procedure neurovascularly intact Comments: Dr. Jeraldine Loots present during sedation and reduction.     Results for orders placed during the hospital encounter of 06/30/11  CBC      Component Value Range   WBC 10.4  4.0 - 10.5 (K/uL)   RBC 4.66  4.22 - 5.81 (MIL/uL)   Hemoglobin 14.9  13.0 - 17.0 (g/dL)   HCT 54.0  98.1 - 19.1 (%)   MCV 90.3  78.0 - 100.0 (fL)   MCH 32.0  26.0 - 34.0 (pg)   MCHC 35.4  30.0 - 36.0 (g/dL)   RDW 47.8  29.5 - 62.1 (%)   Platelets 262  150 - 400 (K/uL)  DIFFERENTIAL      Component Value Range   Neutrophils Relative 49  43 - 77 (%)   Neutro Abs 5.1  1.7 - 7.7 (K/uL)   Lymphocytes Relative 38  12 - 46 (%)   Lymphs Abs 4.0  0.7 - 4.0 (K/uL)   Monocytes Relative 11  3 - 12 (%)   Monocytes Absolute 1.2 (*) 0.1 - 1.0 (K/uL)   Eosinophils Relative 1  0 - 5 (%)   Eosinophils Absolute 0.1  0.0 - 0.7 (K/uL)   Basophils Relative 0  0 - 1 (%)   Basophils Absolute 0.0  0.0 - 0.1 (K/uL)  BASIC METABOLIC PANEL      Component Value Range   Sodium 140  135 - 145 (mEq/L)   Potassium 4.0  3.5 - 5.1 (mEq/L)   Chloride 106  96 - 112 (mEq/L)   CO2 27  19 - 32 (mEq/L)   Glucose, Bld 101 (*) 70 - 99 (mg/dL)   BUN 14  6 - 23 (mg/dL)   Creatinine, Ser 3.08  0.50 - 1.35 (mg/dL)   Calcium 9.0  8.4 - 65.7 (mg/dL)   GFR calc non Af Amer 66 (*) >90 (mL/min)   GFR calc Af Amer 77 (*) >90 (mL/min)     Dg Tibia/fibula Right  06/30/2011  *RADIOLOGY REPORT*  Clinical Data: Injury  RIGHT TIBIA AND FIBULA - 2 VIEW  Comparison: None.  Findings: Ankle fracture dislocation is noted.  The proximal tibia and proximal fibula are intact.  Visualized femur and patella are intact.  IMPRESSION:  Ankle fracture dislocation.  Refer to ankle series.  The proximal tibia and fibula are intact.  Original Report Authenticated By: Donavan Burnet, M.D.   Dg Ankle Complete Right  06/30/2011  *RADIOLOGY REPORT*  Clinical Data: Injury  RIGHT ANKLE - COMPLETE 3+ VIEW  Comparison: None.  Findings: Ankle fracture dislocation is noted.  There is a markedly displaced fracture of the distal fibula.  Fracture line is at least 1 cm above the tibial plafond.  Fracture of the medial malleolus is present.  There is near complete anterior subluxation of the tibial plafond with respect to the talar dome.  Talus and calcaneus are grossly intact.  IMPRESSION: Ankle fracture dislocation as described.  Original Report Authenticated By: Marlowe Aschoff  HOSS, M.D.     1. Ankle fracture   2. Ankle dislocation   3. Bimalleolar ankle fracture       MDM  8:50 PM patient seen and evaluated. Patient no acute distress. Patient with gross deformity of right ankle. Patient on spine board and removed.  Patient has seen Dr. Althea Charon in the past with orthopedics.      Angus Seller, [place] 07/01/11 2045

## 2011-06-30 NOTE — ED Notes (Signed)
Pt given 200 fentanyl And 200 ml of fluid bolus

## 2011-06-30 NOTE — ED Notes (Signed)
Per EMS: pt working and fell while trying to get ice at work. Patient sts no LOC or head injury. Patient a/o x 4. Pt right ankle swollen and bruised with deformity.

## 2011-06-30 NOTE — ED Notes (Signed)
Procedure has not ended, incorrect documentation.

## 2011-06-30 NOTE — ED Notes (Signed)
Ortho tech, quinton called for assistance with closed Reduction.

## 2011-07-01 MED ORDER — HYDROMORPHONE HCL PF 1 MG/ML IJ SOLN
1.0000 mg | Freq: Once | INTRAMUSCULAR | Status: AC
  Administered 2011-07-01: 1 mg via INTRAVENOUS
  Filled 2011-07-01: qty 1

## 2011-07-01 MED ORDER — OXYCODONE-ACETAMINOPHEN 10-325 MG PO TABS: 1.0000 | ORAL_TABLET | ORAL | Status: AC | PRN

## 2011-07-01 NOTE — ED Provider Notes (Signed)
Medical screening examination/treatment/procedure(s) were conducted as a shared visit with non-physician practitioner(s) and myself.  I personally evaluated the patient during the encounter This generally well M presents after a fall w R ankle fracture / dislocation.  On my exam he was in no distress, and was appropriately interactive.  Procedures completed w/o complication by myself, PA Dammen and the ortho tech, Quintin.  We discussed the case with ortho and the patient was d/c to f/u in the office.  Gerhard Munch, MD 07/01/11 [phone]

## 2011-07-01 NOTE — Discharge Instructions (Signed)
You were seen today for your ankle injury. You were found to have a broken bones with dislocation of your ankle. Your ankle dislocation was reduced in your placed in a splint and provided crutches. He will need to followup with orthopedic specialist next week on Monday or Tuesday for further evaluation and treatment of your broken bones. Do not bear weight on your foot and you should crutches as needed. Take the pain medications you were prescribed for severe pains. Use ibuprofen from mild or moderate pain and to help reduce swelling. Use rest, ice, compression and elevation to also reduce swelling and pain.  Ankle Fracture A fracture is a break in the bone. A cast or splint is used to protect and keep your injured bone from moving.  HOME CARE INSTRUCTIONS   Use your crutches as directed.   To lessen the swelling, keep the injured leg elevated while sitting or lying down.   Apply ice to the injury for 15 to 20 minutes, 3 to 4 times per day while awake for 2 days. Put the ice in a plastic bag and place a thin towel between the bag of ice and your cast.   If you have a plaster or fiberglass cast:   Do not try to scratch the skin under the cast using sharp or pointed objects.   Check the skin around the cast every day. You may put lotion on any red or sore areas.   Keep your cast dry and clean.   If you have a plaster splint:   Wear the splint as directed.   You may loosen the elastic around the splint if your toes become numb, tingle, or turn cold or blue.   Do not put pressure on any part of your cast or splint; it may break. Rest your cast only on a pillow the first 24 hours until it is fully hardened.   Your cast or splint can be protected during bathing with a plastic bag. Do not lower the cast or splint into water.   Take medications as directed by your caregiver. Only take over-the-counter or prescription medicines for pain, discomfort, or fever as directed by your caregiver.   Do  not drive a vehicle until your caregiver specifically tells you it is safe to do so.   If your caregiver has given you a follow-up appointment, it is very important to keep that appointment. Not keeping the appointment could result in a chronic or permanent injury, pain, and disability. If there is any problem keeping the appointment, you must call back to this facility for assistance.  SEEK IMMEDIATE MEDICAL CARE IF:   Your cast gets damaged or breaks.   You have continued severe pain or more swelling than you did before the cast was put on.   Your skin or toenails below the injury turn blue or gray, or feel cold or numb.   There is a bad smell or new stains and/or purulent (pus like) drainage coming from under the cast.  If you do not have a window in your cast for observing the wound, a discharge or minor bleeding may show up as a stain on the outside of your cast. Report these findings to your caregiver. MAKE SURE YOU:   Understand these instructions.   Will watch your condition.   Will get help right away if you are not doing well or get worse.  Document Released: 02/17/2000 Document Revised: 02/08/2011 Document Reviewed: 09/23/2007 [facility] Patient Information 2012 [place], [place].  RESOURCE GUIDE  Dental Problems  Patients with Medicaid: [facility] [phone]                                           [phone] [address] Phone:  [phone]                                                  Phone:  [phone]  If unable to pay or uninsured, contact:  [facility] or [facility]. to become qualified for the adult dental clinic.  Chronic Pain Problems Contact [facility]  [phone] Patients need to be referred by their primary care doctor.  Insufficient Money for Medicine Contact United Way:  call "211" or [facility] 2513988846.  No Primary Care Doctor Call [facility]  [phone] Other agencies that provide inexpensive medical care    [facility]  [phone]    [facility]  [phone]    [facility]  [phone]    [facility]  [phone]    [facility]  [phone]    [facility]  [phone]  Psychological Services [facility]  [phone] [facility] Services  [phone] [facility]   [phone] (emergency services 3341527242)  Substance Abuse Resources [facility]  (928)399-8057 [facility] 3407323046 [facility] [place] 7821185905 Floydene Flock (703)073-0317 Residential & Outpatient Substance Abuse Program  (276)061-3456  Abuse/Neglect [facility] Child Abuse Hotline 419-057-6341 [facility] Child Abuse Hotline (818)279-9370 (After Hours)  Emergency Shelter [facility] 484-116-9442  M[facility] [place] [facility] 6464888587 [facility] (254)144-2754  MRSA Hotline #:   734-379-0608    [facility] Resources  [facility]     [employer]                          [facility] Dept. [address] [place]                       [address]      [address]  Blondell Reveal Phone:  [phone]                                   Phone:  [phone]                 Phone:  [phone]  [facility] Phone:  [phone]  [place] [phone] (After Hours)

## 2011-07-01 NOTE — ED Notes (Signed)
Patient given discharge instructions, information, prescriptions, and diet order. Patient states that they adequately understand discharge information given and to return to ED if symptoms return or worsen.     

## 2011-07-02 ENCOUNTER — Encounter (HOSPITAL_BASED_OUTPATIENT_CLINIC_OR_DEPARTMENT_OTHER): Payer: Self-pay | Admitting: *Deleted

## 2011-07-02 NOTE — Progress Notes (Signed)
Need [employer] wife coming with him

## 2011-07-03 ENCOUNTER — Other Ambulatory Visit: Payer: Self-pay | Admitting: Orthopedic Surgery

## 2011-07-04 ENCOUNTER — Encounter (HOSPITAL_BASED_OUTPATIENT_CLINIC_OR_DEPARTMENT_OTHER): Payer: Self-pay | Admitting: Anesthesiology

## 2011-07-04 ENCOUNTER — Ambulatory Visit (HOSPITAL_BASED_OUTPATIENT_CLINIC_OR_DEPARTMENT_OTHER)
Admission: RE | Admit: 2011-07-04 | Discharge: 2011-07-04 | Disposition: A | Discharge status: Home / Self Care | Payer: 59 | Source: Ambulatory Visit | Attending: Orthopedic Surgery | Admitting: Orthopedic Surgery

## 2011-07-04 ENCOUNTER — Encounter (HOSPITAL_BASED_OUTPATIENT_CLINIC_OR_DEPARTMENT_OTHER)
Admission: RE | Disposition: A | Discharge status: Home / Self Care | Payer: Self-pay | Source: Ambulatory Visit | Attending: Orthopedic Surgery

## 2011-07-04 ENCOUNTER — Encounter (HOSPITAL_BASED_OUTPATIENT_CLINIC_OR_DEPARTMENT_OTHER): Payer: Self-pay | Admitting: Certified Registered"

## 2011-07-04 ENCOUNTER — Ambulatory Visit (HOSPITAL_BASED_OUTPATIENT_CLINIC_OR_DEPARTMENT_OTHER): Payer: 59 | Admitting: Anesthesiology

## 2011-07-04 DIAGNOSIS — I10 Essential (primary) hypertension: Secondary | ICD-10-CM | POA: Insufficient documentation

## 2011-07-04 DIAGNOSIS — I252 Old myocardial infarction: Secondary | ICD-10-CM | POA: Insufficient documentation

## 2011-07-04 DIAGNOSIS — E119 Type 2 diabetes mellitus without complications: Secondary | ICD-10-CM | POA: Insufficient documentation

## 2011-07-04 DIAGNOSIS — W010XXA Fall on same level from slipping, tripping and stumbling without subsequent striking against object, initial encounter: Secondary | ICD-10-CM | POA: Insufficient documentation

## 2011-07-04 DIAGNOSIS — I2510 Atherosclerotic heart disease of native coronary artery without angina pectoris: Secondary | ICD-10-CM | POA: Insufficient documentation

## 2011-07-04 DIAGNOSIS — M129 Arthropathy, unspecified: Secondary | ICD-10-CM | POA: Insufficient documentation

## 2011-07-04 DIAGNOSIS — G4730 Sleep apnea, unspecified: Secondary | ICD-10-CM | POA: Insufficient documentation

## 2011-07-04 DIAGNOSIS — Z87891 Personal history of nicotine dependence: Secondary | ICD-10-CM | POA: Insufficient documentation

## 2011-07-04 DIAGNOSIS — Z8546 Personal history of malignant neoplasm of prostate: Secondary | ICD-10-CM | POA: Insufficient documentation

## 2011-07-04 DIAGNOSIS — Y9229 Other specified public building as the place of occurrence of the external cause: Secondary | ICD-10-CM | POA: Insufficient documentation

## 2011-07-04 DIAGNOSIS — S82853A Displaced trimalleolar fracture of unspecified lower leg, initial encounter for closed fracture: Secondary | ICD-10-CM | POA: Insufficient documentation

## 2011-07-04 DIAGNOSIS — Y990 Civilian activity done for income or pay: Secondary | ICD-10-CM | POA: Insufficient documentation

## 2011-07-04 HISTORY — DX: Malignant (primary) neoplasm, unspecified: C80.1

## 2011-07-04 HISTORY — DX: Bladder disorder, unspecified: N32.9

## 2011-07-04 HISTORY — DX: Unspecified osteoarthritis, unspecified site: M19.90

## 2011-07-04 HISTORY — DX: Essential (primary) hypertension: I10

## 2011-07-04 HISTORY — PX: ORIF ANKLE FRACTURE: SHX5408

## 2011-07-04 LAB — POCT I-STAT, CHEM 8
BUN: 18 mg/dL (ref 6–23)
Calcium, Ion: 1.13 mmol/L (ref 1.12–1.32)
Chloride: 105 mEq/L (ref 96–112)
Glucose, Bld: 120 mg/dL — ABNORMAL HIGH (ref 70–99)

## 2011-07-04 SURGERY — OPEN REDUCTION INTERNAL FIXATION (ORIF) ANKLE FRACTURE
Anesthesia: General | Site: Ankle | Laterality: Right | Wound class: Clean

## 2011-07-04 MED ORDER — CEFAZOLIN SODIUM 1-5 GM-% IV SOLN
1.0000 g | Freq: Once | INTRAVENOUS | Status: AC
  Administered 2011-07-04: 1 g via INTRAVENOUS

## 2011-07-04 MED ORDER — PROPOFOL 10 MG/ML IV EMUL
INTRAVENOUS | Status: DC | PRN
  Administered 2011-07-04: 150 mg via INTRAVENOUS

## 2011-07-04 MED ORDER — 0.9 % SODIUM CHLORIDE (POUR BTL) OPTIME
TOPICAL | Status: DC | PRN
  Administered 2011-07-04: 1000 mL

## 2011-07-04 MED ORDER — ONDANSETRON HCL 4 MG/2ML IJ SOLN
INTRAMUSCULAR | Status: DC | PRN
  Administered 2011-07-04: 4 mg via INTRAVENOUS

## 2011-07-04 MED ORDER — MIDAZOLAM HCL 2 MG/2ML IJ SOLN
1.0000 mg | INTRAMUSCULAR | Status: DC | PRN
  Administered 2011-07-04: 2 mg via INTRAVENOUS

## 2011-07-04 MED ORDER — LACTATED RINGERS IV SOLN
INTRAVENOUS | Status: DC
  Administered 2011-07-04 (×2): via INTRAVENOUS

## 2011-07-04 MED ORDER — FENTANYL CITRATE 0.05 MG/ML IJ SOLN
50.0000 ug | INTRAMUSCULAR | Status: DC | PRN
  Administered 2011-07-04: 100 ug via INTRAVENOUS

## 2011-07-04 MED ORDER — KETOROLAC TROMETHAMINE 30 MG/ML IJ SOLN
INTRAMUSCULAR | Status: DC | PRN
  Administered 2011-07-04: 30 mg via INTRAVENOUS

## 2011-07-04 MED ORDER — FENTANYL CITRATE 0.05 MG/ML IJ SOLN
INTRAMUSCULAR | Status: DC | PRN
  Administered 2011-07-04: 100 ug via INTRAVENOUS

## 2011-07-04 MED ORDER — EPHEDRINE SULFATE 50 MG/ML IJ SOLN
INTRAMUSCULAR | Status: DC | PRN
  Administered 2011-07-04: 10 mg via INTRAVENOUS

## 2011-07-04 MED ORDER — BUPIVACAINE-EPINEPHRINE PF 0.5-1:200000 % IJ SOLN
INTRAMUSCULAR | Status: DC | PRN
  Administered 2011-07-04: 10 mL
  Administered 2011-07-04: 30 mL

## 2011-07-04 MED ORDER — OXYCODONE-ACETAMINOPHEN 5-325 MG PO TABS
2.0000 | ORAL_TABLET | Freq: Once | ORAL | Status: AC
  Administered 2011-07-04: 2 via ORAL

## 2011-07-04 MED ORDER — CEFAZOLIN SODIUM 1-5 GM-% IV SOLN
INTRAVENOUS | Status: DC | PRN
  Administered 2011-07-04: 2 g via INTRAVENOUS

## 2011-07-04 MED ORDER — HYDROMORPHONE HCL PF 1 MG/ML IJ SOLN
0.2500 mg | INTRAMUSCULAR | Status: DC | PRN
  Administered 2011-07-04: 0.5 mg via INTRAVENOUS
  Administered 2011-07-04: 0.25 mg via INTRAVENOUS
  Administered 2011-07-04: 0.5 mg via INTRAVENOUS
  Administered 2011-07-04: 0.25 mg via INTRAVENOUS
  Administered 2011-07-04: 0.5 mg via INTRAVENOUS

## 2011-07-04 MED ORDER — OXYCODONE-ACETAMINOPHEN 5-325 MG PO TABS: 1.0000 | ORAL_TABLET | Freq: Four times a day (QID) | ORAL | Status: AC | PRN

## 2011-07-04 SURGICAL SUPPLY — 84 items
APL SKNCLS STERI-STRIP NONHPOA (GAUZE/BANDAGES/DRESSINGS)
BANDAGE ELASTIC 4 VELCRO ST LF (GAUZE/BANDAGES/DRESSINGS) ×2 IMPLANT
BANDAGE ELASTIC 6 VELCRO ST LF (GAUZE/BANDAGES/DRESSINGS) ×1 IMPLANT
BANDAGE ESMARK 6X9 LF (GAUZE/BANDAGES/DRESSINGS) IMPLANT
BENZOIN TINCTURE PRP APPL 2/3 (GAUZE/BANDAGES/DRESSINGS) IMPLANT
BIT DRILL SOLID 2.5X110 (BIT) ×1 IMPLANT
BIT DRILL SOLID 3.5X110 (BIT) ×1 IMPLANT
BLADE SURG 15 STRL LF DISP TIS (BLADE) ×1 IMPLANT
BLADE SURG 15 STRL SS (BLADE) ×4
BNDG CMPR 9X4 STRL LF SNTH (GAUZE/BANDAGES/DRESSINGS) ×1
BNDG CMPR 9X6 STRL LF SNTH (GAUZE/BANDAGES/DRESSINGS) ×1
BNDG ESMARK 4X9 LF (GAUZE/BANDAGES/DRESSINGS) ×2 IMPLANT
BNDG ESMARK 6X9 LF (GAUZE/BANDAGES/DRESSINGS) ×2
CANISTER SUCTION 1200CC (MISCELLANEOUS) ×1 IMPLANT
CLOTH BEACON ORANGE TIMEOUT ST (SAFETY) ×2 IMPLANT
COVER TABLE BACK 60X90 (DRAPES) ×2 IMPLANT
CUFF TOURNIQUET SINGLE 18IN (TOURNIQUET CUFF) IMPLANT
DECANTER SPIKE VIAL GLASS SM (MISCELLANEOUS) IMPLANT
DRAPE EXTREMITY T 121X128X90 (DRAPE) ×2 IMPLANT
DRAPE OEC MINIVIEW 54X84 (DRAPES) ×2 IMPLANT
DRAPE U 20/CS (DRAPES) ×2 IMPLANT
DRAPE U-SHAPE 47X51 STRL (DRAPES) ×1 IMPLANT
DRSG EMULSION OIL 3X3 NADH (GAUZE/BANDAGES/DRESSINGS) ×1 IMPLANT
DURAPREP 26ML APPLICATOR (WOUND CARE) ×2 IMPLANT
ELECT REM PT RETURN 9FT ADLT (ELECTROSURGICAL) ×2
ELECTRODE REM PT RTRN 9FT ADLT (ELECTROSURGICAL) ×1 IMPLANT
GAUZE SPONGE 4X4 16PLY XRAY LF (GAUZE/BANDAGES/DRESSINGS) ×1 IMPLANT
GAUZE XEROFORM 1X8 LF (GAUZE/BANDAGES/DRESSINGS) ×1 IMPLANT
GLOVE BIO SURGEON STRL SZ 6.5 (GLOVE) ×1 IMPLANT
GLOVE BIOGEL PI IND STRL 7.0 (GLOVE) IMPLANT
GLOVE BIOGEL PI IND STRL 8 (GLOVE) ×2 IMPLANT
GLOVE BIOGEL PI INDICATOR 7.0 (GLOVE) ×1
GLOVE BIOGEL PI INDICATOR 8 (GLOVE) ×2
GLOVE ECLIPSE 7.5 STRL STRAW (GLOVE) ×6 IMPLANT
GOWN BRE IMP PREV XXLGXLNG (GOWN DISPOSABLE) ×2 IMPLANT
GOWN PREVENTION PLUS XLARGE (GOWN DISPOSABLE) ×3 IMPLANT
GOWN PREVENTION PLUS XXLARGE (GOWN DISPOSABLE) ×2 IMPLANT
K-WIRE 2.0X150M (WIRE) ×2
KIT 1/3 TUB PL 7H 85M (Orthopedic Implant) IMPLANT
KWIRE 2.0X150M (WIRE) IMPLANT
NEEDLE HYPO 22GX1.5 SAFETY (NEEDLE) IMPLANT
NS IRRIG 1000ML POUR BTL (IV SOLUTION) ×2 IMPLANT
PACK BASIN DAY SURGERY FS (CUSTOM PROCEDURE TRAY) ×2 IMPLANT
PAD CAST 4YDX4 CTTN HI CHSV (CAST SUPPLIES) ×1 IMPLANT
PADDING CAST ABS 4INX4YD NS (CAST SUPPLIES) ×1
PADDING CAST ABS COTTON 4X4 ST (CAST SUPPLIES) ×1 IMPLANT
PADDING CAST COTTON 4X4 STRL (CAST SUPPLIES) ×2
PADDING CAST COTTON 6X4 STRL (CAST SUPPLIES) ×1 IMPLANT
PENCIL BUTTON HOLSTER BLD 10FT (ELECTRODE) ×2 IMPLANT
PROS 1/3 TUB PL 7H 85M (Orthopedic Implant) ×2 IMPLANT
SCREW CANC FT ST SFS 4X14 (Screw) ×2 IMPLANT
SCREW CANC FT ST SFS 4X16 (Screw) ×1 IMPLANT
SCREW CANC FT/18 4.0 (Screw) ×1 IMPLANT
SCREW CANC PT 4.0X45 (Screw) ×2 IMPLANT
SCREW CORTEX 3.5 14MM (Screw) ×2 IMPLANT
SCREW CORTEX 3.5 16MM (Screw) ×1 IMPLANT
SCREW CORTEX 3.5 18MM (Screw) ×1 IMPLANT
SCREW CORTEX 3.5 20MM (Screw) ×1 IMPLANT
SCREW LOCK CORT ST 3.5X14 (Screw) IMPLANT
SCREW LOCK CORT ST 3.5X16 (Screw) IMPLANT
SCREW LOCK CORT ST 3.5X18 (Screw) IMPLANT
SCREW LOCK CORT ST 3.5X20 (Screw) IMPLANT
SPLINT FAST PLASTER 5X30 (CAST SUPPLIES) ×20
SPLINT PLASTER CAST FAST 5X30 (CAST SUPPLIES) IMPLANT
SPONGE GAUZE 4X4 12PLY (GAUZE/BANDAGES/DRESSINGS) ×2 IMPLANT
SPONGE LAP 4X18 X RAY DECT (DISPOSABLE) ×1 IMPLANT
STAPLER VISISTAT (STAPLE) IMPLANT
STOCKINETTE 6  STRL (DRAPES) ×1
STOCKINETTE 6 STRL (DRAPES) ×1 IMPLANT
STRIP CLOSURE SKIN 1/2X4 (GAUZE/BANDAGES/DRESSINGS) IMPLANT
SUCTION FRAZIER TIP 10 FR DISP (SUCTIONS) ×1 IMPLANT
SUT ETHILON 3 0 PS 1 (SUTURE) ×2 IMPLANT
SUT ETHILON 4 0 PS 2 18 (SUTURE) IMPLANT
SUT MON AB 4-0 PC3 18 (SUTURE) IMPLANT
SUT VIC AB 2-0 SH 27 (SUTURE) ×2
SUT VIC AB 2-0 SH 27XBRD (SUTURE) ×1 IMPLANT
SUT VIC AB 3-0 FS2 27 (SUTURE) ×1 IMPLANT
SUT VICRYL 4-0 PS2 18IN ABS (SUTURE) IMPLANT
SYR 20CC LL (SYRINGE) IMPLANT
SYR BULB 3OZ (MISCELLANEOUS) ×2 IMPLANT
TOWEL OR NON WOVEN STRL DISP B (DISPOSABLE) ×2 IMPLANT
TUBE CONNECTING 20X1/4 (TUBING) ×1 IMPLANT
UNDERPAD 30X30 INCONTINENT (UNDERPADS AND DIAPERS) ×2 IMPLANT
WATER STERILE IRR 1000ML POUR (IV SOLUTION) ×1 IMPLANT

## 2011-07-04 NOTE — Discharge Instructions (Signed)
Ambulate  Non wgt bearing on the left using crutches. Elevate your leg as much as possible.   Post Anesthesia Home Care Instructions  Activity: Get plenty of rest for the remainder of the day. A responsible adult should stay with you for 24 hours following the procedure.  For the next 24 hours, DO NOT: -Drive a car -Advertising copywriter -Drink alcoholic beverages -Take any medication unless instructed by your physician -Make any legal decisions or sign important papers.  Meals: Start with liquid foods such as gelatin or soup. Progress to regular foods as tolerated. Avoid greasy, spicy, heavy foods. If nausea and/or vomiting occur, drink only clear liquids until the nausea and/or vomiting subsides. Call your physician if vomiting continues.  Special Instructions/Symptoms: Your throat may feel dry or sore from the anesthesia or the breathing tube placed in your throat during surgery. If this causes discomfort, gargle with warm salt water. The discomfort should disappear within 24 hours.  Regional Anesthesia Blocks  1. Numbness or the inability to move the "blocked" extremity may last from 3-48 hours after placement. The length of time depends on the medication injected and your individual response to the medication. If the numbness is not going away after 48 hours, call your surgeon.  2. The extremity that is blocked will need to be protected until the numbness is gone and the  Strength has returned. Because you cannot feel it, you will need to take extra care to avoid injury. Because it may be weak, you may have difficulty moving it or using it. You may not know what position it is in without looking at it while the block is in effect.  3. For blocks in the legs and feet, returning to weight bearing and walking needs to be done carefully. You will need to wait until the numbness is entirely gone and the strength has returned. You should be able to move your leg and foot normally before you try  and bear weight or walk. You will need someone to be with you when you first try to ensure you do not fall and possibly risk injury.  4. Bruising and tenderness at the needle site are common side effects and will resolve in a few days.  5. Persistent numbness or new problems with movement should be communicated to the surgeon or the [facility] [phone] [facility] [phone]).

## 2011-07-04 NOTE — Anesthesia Preprocedure Evaluation (Addendum)
Anesthesia Evaluation  Patient identified by MRN, date of birth, ID band Patient awake    Reviewed: Allergy & Precautions, H&P , NPO status , Patient's Chart, lab work & pertinent test results  History of Anesthesia Complications Negative for: history of anesthetic complications  Airway Mallampati: II TM Distance: >3 FB Neck ROM: Full    Dental  (+) Caps and Dental Advisory Given   Pulmonary sleep apnea (doesn't use CPAP) , former smoker (quit 2002) breath sounds clear to auscultation  Pulmonary exam normal       Cardiovascular Exercise Tolerance: Good hypertension, Pt. on medications + CAD (cath  '12: stents patent, EF 55%, postlat hypokin) and + Past MI Rhythm:Regular Rate:Normal     Neuro/Psych negative neurological ROS     GI/Hepatic negative GI ROS, Neg liver ROS,   Endo/Other  Diabetes mellitus- (diet management, glu 120), Type 2  Renal/GU      Musculoskeletal  (+) Arthritis -,   Abdominal   Peds  Hematology negative hematology ROS (+)   Anesthesia Other Findings   Reproductive/Obstetrics                           Anesthesia Physical Anesthesia Plan  ASA: III  Anesthesia Plan: General   Post-op Pain Management:    Induction: Intravenous  Airway Management Planned: LMA  Additional Equipment:   Intra-op Plan:   Post-operative Plan:   Informed Consent: I have reviewed the patients History and Physical, chart, labs and discussed the procedure including the risks, benefits and alternatives for the proposed anesthesia with the patient or authorized representative who has indicated his/her understanding and acceptance.   Dental advisory given  Plan Discussed with: CRNA and Surgeon  Anesthesia Plan Comments: (Plan routine monitors, GA- LMA OK, popliteal block for post op analgesia  I personally performed the preop evaluation and exam.  Sandford Craze, MD )        Anesthesia  Quick Evaluation

## 2011-07-04 NOTE — Progress Notes (Signed)
Assisted Dr. Jackson with right, popliteal block. Side rails up, monitors on throughout procedure. See vital signs in flow sheet. Tolerated Procedure well. 

## 2011-07-04 NOTE — H&P (Signed)
PREOPERATIVE H&P  Chief Complaint: r. Ankle pain  HPI: [name] is a 56 y.o. male who presents for evaluation of r ankle fracture. It has been present for 1 week and has been worsening. He has failed conservative measures. Pain is rated as moderate.  Past Medical History  Diagnosis Date  . Myocardial infarction 02,6/12  . Hypertension   . Arthritis   . Cancer     hx prostate cancer  . Bladder disorder    Past Surgical History  Procedure Date  . Carotid stent   . Tonsillectomy   . Fracture surgery     rt wrist  . Toe surgery     rt great toe  . Wrist surgery     rt and lt cysts removed   History   Social History  . Marital Status: Married    Spouse Name: N/A    Number of Children: N/A  . Years of Education: N/A   Social History Main Topics  . Smoking status: Never Smoker   . Smokeless tobacco: Not on file  . Alcohol Use: Yes     rare  . Drug Use: No  . Sexually Active:    Other Topics Concern  . Not on file   Social History Narrative  . No narrative on file   No family history on file. Allergies  Allergen Reactions  . Contrast Media (Iodinated Diagnostic Agents)    Prior to Admission medications   Medication Sig Start Date End Date Taking? Authorizing Provider  darifenacin (ENABLEX) 15 MG 24 hr tablet Take 30 mg by mouth daily.   Yes Historical Provider, MD  ezetimibe (ZETIA) 10 MG tablet Take 10 mg by mouth daily.   Yes Historical Provider, MD  isosorbide mononitrate (IMDUR) 30 MG 24 hr tablet Take 30 mg by mouth daily.   Yes Historical Provider, MD  Mirabegron ER (MYRBETRIQ) 25 MG TB24 Take 25 mg by mouth daily.   Yes Historical Provider, MD  valsartan (DIOVAN) 40 MG tablet Take 40 mg by mouth daily.   Yes Historical Provider, MD  aspirin 81 MG chewable tablet Chew 81 mg by mouth daily.    Historical Provider, MD  hydrOXYzine (ATARAX/VISTARIL) 25 MG tablet Take 50 mg by mouth daily.    Historical Provider, MD  nitroGLYCERIN 9 MG CR capsule Take  9 mg by mouth daily.    Historical Provider, MD  oxyCODONE-acetaminophen (PERCOCET) 10-325 MG per tablet Take 1 tablet by mouth every 4 (four) hours as needed for pain. 07/01/11 07/11/11  Angus Seller, PA     Positive ROS: none  All other systems have been reviewed and were otherwise negative with the exception of those mentioned in the HPI and as above.  Physical Exam: Filed Vitals:   07/04/11 0818  BP: 117/79  Pulse: 103  Temp: 99 F (37.2 C)  Resp: 18    General: Alert, no acute distress Cardiovascular: No pedal edema Respiratory: No cyanosis, no use of accessory musculature GI: No organomegaly, abdomen is soft and non-tender Skin: No lesions in the area of chief complaint Neurologic: Sensation intact distally Psychiatric: Patient is competent for consent with normal mood and affect Lymphatic: No axillary or cervical lymphadenopathy  MUSCULOSKELETAL: r. Ankle +sts and painful rom  X-ray: trimal ankle fracture with displacement  Assessment/Plan: right ankle bimalleolar fracture Plan for Procedure(s): OPEN REDUCTION INTERNAL FIXATION (ORIF) ANKLE FRACTURE  The risks benefits and alternatives were discussed with the patient including but not limited to the risks  of nonoperative treatment, versus surgical intervention including infection, bleeding, nerve injury, malunion, nonunion, hardware prominence, hardware failure, need for hardware removal, blood clots, cardiopulmonary complications, morbidity, mortality, among others, and they were willing to proceed.  Predicted outcome is good, although there will be at least a six to nine month expected recovery.  Rosemaria Inabinet L, MD 07/04/2011 8:53 AM

## 2011-07-04 NOTE — Transfer of Care (Signed)
Immediate Anesthesia Transfer of Care Note  Patient: [name]  Procedure(s) Performed: Procedure(s) (LRB): OPEN REDUCTION INTERNAL FIXATION (ORIF) ANKLE FRACTURE (Right)  Patient Location: PACU  Anesthesia Type: GA combined with regional for post-op pain  Level of Consciousness: awake, alert , oriented and patient cooperative  Airway & Oxygen Therapy: Patient Spontanous Breathing and Patient connected to face mask oxygen  Post-op Assessment: Report given to PACU RN and Post -op Vital signs reviewed and stable  Post vital signs: Reviewed and stable  Complications: No apparent anesthesia complications

## 2011-07-04 NOTE — Anesthesia Postprocedure Evaluation (Signed)
  Anesthesia Post-op Note  Patient: [name]  Procedure(s) Performed: Procedure(s) (LRB): OPEN REDUCTION INTERNAL FIXATION (ORIF) ANKLE FRACTURE (Right)  Patient Location: PACU  Anesthesia Type: GA combined with regional for post-op pain  Level of Consciousness: awake, alert  and oriented  Airway and Oxygen Therapy: Patient Spontanous Breathing  Post-op Pain: mild  Post-op Assessment: Post-op Vital signs reviewed, Patient's Cardiovascular Status Stable, Respiratory Function Stable, Patent Airway, No signs of Nausea or vomiting and Pain level controlled  Post-op Vital Signs: Reviewed and stable  Complications: No apparent anesthesia complications

## 2011-07-04 NOTE — Anesthesia Procedure Notes (Addendum)
Anesthesia Regional Block:  Popliteal block  Pre-Anesthetic Checklist: ,, timeout performed, Correct Patient, Correct Site, Correct Laterality, Correct Procedure, Correct Position, site marked, Risks and benefits discussed,  Surgical consent,  Pre-op evaluation,  At surgeon's request and post-op pain management  Laterality: Right  Prep: chloraprep       Needles:  Injection technique: Single-shot  Needle Type: Stimiplex      Needle Gauge: 22 and 22 G    Additional Needles:  Procedures: nerve stimulator Popliteal block  Nerve Stimulator or Paresthesia:  Response: toe dorsifkexion and toe abduction, 0.45 mA, 0.1 ms,   Additional Responses:   Narrative:  Start time: 07/04/2011 9:12 AM End time: 07/04/2011 9:16 AM Injection made incrementally with aspirations every 5 mL.  Performed by: Personally  Anesthesiologist: Sandford Craze, MD  Additional Notes: Pt identified in Holding room.  Monitors applied. Working IV access confirmed. Sterile prep R lateral knee.  #22ga PNS to toe dorsiflexion and toe abduction twitch at 0.48mA threshold.  30cc 0.5% Bupivacaine with 1:200k epi injected incrementally after negative test dose.  Supplemental saphenous nerve block with 10cc 0.5% Bupivacaine, 1:200k epi infiltrated subq at prox, ant med tibia. Patient asymptomatic, VSS, no heme aspirated, tolerated well.   Sandford Craze, MD   Procedure Name: LMA Insertion Date/Time: 07/04/2011 9:36 AM Performed by: Verlan Friends Pre-anesthesia Checklist: Patient identified, Emergency Drugs available, Suction available, Patient being monitored and Timeout performed Patient Re-evaluated:Patient Re-evaluated prior to inductionOxygen Delivery Method: Circle System Utilized Preoxygenation: Pre-oxygenation with 100% oxygen Intubation Type: IV induction Ventilation: Mask ventilation without difficulty LMA: LMA inserted LMA Size: 5.0 Number of attempts: 1 (atraumatic) Airway Equipment and Method: bite block (left  posterior bite gard used) Placement Confirmation: positive ETCO2 Tube secured with: Tape Dental Injury: Teeth and Oropharynx as per pre-operative assessment

## 2011-07-04 NOTE — Brief Op Note (Signed)
07/04/2011  10:41 AM  PATIENT:  Abbe Amsterdam  56 y.o. male  PRE-OPERATIVE DIAGNOSIS:  right ankle bimalleolar fracture  POST-OPERATIVE DIAGNOSIS:  * No post-op diagnosis entered *  PROCEDURE:  Procedure(s) (LRB): OPEN REDUCTION INTERNAL FIXATION (ORIF) ANKLE FRACTURE (Right)  SURGEON:  Surgeon(s) and Role:    * Harvie Junior, MD - Primary  PHYSICIAN ASSISTANT:   ASSISTANTS: bethune    ANESTHESIA:   general  EBL:  Total I/O In: 1000 [I.V.:1000] Out: -   BLOOD ADMINISTERED:none  DRAINS: none   LOCAL MEDICATIONS USED:  MARCAINE     SPECIMEN:  No Specimen  DISPOSITION OF SPECIMEN:  N/A  COUNTS:  YES  TOURNIQUET:  * Missing tourniquet times found for documented tourniquets in log:  16109 *  DICTATION: .Other Dictation: Dictation Number [id number]  PLAN OF CARE: Discharge to home after PACU  PATIENT DISPOSITION:  PACU - hemodynamically stable.   Delay start of Pharmacological VTE agent (>24hrs) due to surgical blood loss or risk of bleeding: not applicable

## 2011-07-05 NOTE — Op Note (Signed)
NAMEVELMER, WOELFEL             ACCOUNT NO.:  [id number]  MEDICAL RECORD NO.:  [id number]  LOCATION:                                 FACILITY:  PHYSICIAN:  Harvie Junior, M.D.   DATE OF BIRTH:  14-Feb-1956  DATE OF PROCEDURE:  07/04/2011 DATE OF DISCHARGE:                              OPERATIVE REPORT   PREOPERATIVE DIAGNOSIS:  Displaced trimalleolar ankle fracture.  POSTOPERATIVE DIAGNOSIS:  Displaced trimalleolar ankle fracture.  PROCEDURE:  Open reduction, internal fixation of trimalleolar ankle fracture with fixation of the lateral and medial side.  SURGEON:  Harvie Junior, M.D.  ANESTHESIA:  General.  ASSISTANT SURGEON:  Marshia Ly, P.A.  BRIEF HISTORY:  The patient is a 56 year old male, who was working at the [employer] [facility] [employer] and slipped and suffered a trimalleolar ankle fracture dislocation.  He was taken to the emergency room with a reduce to dislocation, and he was sent to our office.  At that point, we felt that he had a fairly swollen ankle with displaced trimalleolar ankle fracture that had been reduced.  We felt that he needed open reduction, internal fixation.  At that point, we took him out of the splint that he was came to [place] and we put him in an aggressive compressive dressing and elevated him for 2 days, and at that point, took him to the operating room for open reduction and fixation.  PROCEDURE:  The patient was brought to the operating room.  After adequate anesthesia was induced with general anesthetic, the patient was placed supine on the operating table.  The right leg was then prepped and draped in sterile fashion.  Following this, the leg was exsanguinated.  Blood pressure tourniquet inflated to 350 mmHg. Following this, attention was turned to the lateral aspect of the ankle where an incision was made at the subcutaneous tissue down the level of the lateral malleolus which was clearly identified.  Fracture elements were cleansed and a  manipulative closed reduction was undertaken. Anatomic alignment was achieved.  Once this was done, the attention was turned to the medial side where incision was made to the subcutaneous tissue down to the level up to the displaced fracture.  The fracture was cleansed of healing elements, manipulative closed reduction was undertaken and a K-wire was put in place.  While that K-wire was holding the fracture reduced, a drill was used to give [place] an anatomic alignment of the fracture and the screws were put in place to hold the fracture anatomically reduced.  Two screws were put in, in parallel giving perfect compression and reduction.  Once this was completed, attention turned back to the lateral side where under direct visualization reduction and interfragmentary screw was placed, and then a 7 hole 1/3 tubular plate was used to get 6 holes above and below with screws and anatomic alignment was achieved here.  Attention was then turned to the posterior malleolus that was observed under fluoro and noted to be stable.  At that point, we left the posterior malleolus alone.  At this point, medial and lateral wounds were copiously and thoroughly irrigated and suctioned dry.  Multiple fluoroscopic images were taken intraoperatively to ensure that we had  perfect alignment, and once this was completed, the wounds were closed in layers and skin with nylon. Sterile compressive dressing was applied as well as __________ posterior care being taken to hold the foot up__________.     Harvie Junior, M.D.     Ranae Plumber  D:  07/04/2011  T:  07/04/2011  Job:  [id number]

## 2011-07-06 ENCOUNTER — Encounter (HOSPITAL_BASED_OUTPATIENT_CLINIC_OR_DEPARTMENT_OTHER): Payer: Self-pay | Admitting: Orthopedic Surgery

## 2012-01-16 ENCOUNTER — Encounter (HOSPITAL_BASED_OUTPATIENT_CLINIC_OR_DEPARTMENT_OTHER): Payer: Self-pay

## 2012-01-28 ENCOUNTER — Emergency Department (HOSPITAL_COMMUNITY): Payer: 59

## 2012-01-28 ENCOUNTER — Emergency Department (HOSPITAL_COMMUNITY)
Admission: EM | Admit: 2012-01-28 | Discharge: 2012-01-28 | Disposition: A | Discharge status: Home / Self Care | Payer: 59 | Attending: Emergency Medicine | Admitting: Emergency Medicine

## 2012-01-28 ENCOUNTER — Encounter (HOSPITAL_COMMUNITY): Payer: Self-pay | Admitting: *Deleted

## 2012-01-28 DIAGNOSIS — N329 Bladder disorder, unspecified: Secondary | ICD-10-CM | POA: Insufficient documentation

## 2012-01-28 DIAGNOSIS — I252 Old myocardial infarction: Secondary | ICD-10-CM | POA: Insufficient documentation

## 2012-01-28 DIAGNOSIS — Z8546 Personal history of malignant neoplasm of prostate: Secondary | ICD-10-CM | POA: Insufficient documentation

## 2012-01-28 DIAGNOSIS — R079 Chest pain, unspecified: Secondary | ICD-10-CM

## 2012-01-28 DIAGNOSIS — Z7982 Long term (current) use of aspirin: Secondary | ICD-10-CM | POA: Insufficient documentation

## 2012-01-28 DIAGNOSIS — Z79899 Other long term (current) drug therapy: Secondary | ICD-10-CM | POA: Insufficient documentation

## 2012-01-28 DIAGNOSIS — I10 Essential (primary) hypertension: Secondary | ICD-10-CM | POA: Insufficient documentation

## 2012-01-28 DIAGNOSIS — I2510 Atherosclerotic heart disease of native coronary artery without angina pectoris: Secondary | ICD-10-CM | POA: Insufficient documentation

## 2012-01-28 DIAGNOSIS — M129 Arthropathy, unspecified: Secondary | ICD-10-CM | POA: Insufficient documentation

## 2012-01-28 HISTORY — DX: Atherosclerotic heart disease of native coronary artery without angina pectoris: I25.10

## 2012-01-28 LAB — BASIC METABOLIC PANEL
BUN: 11 mg/dL (ref 6–23)
CO2: 25 mEq/L (ref 19–32)
Calcium: 9.2 mg/dL (ref 8.4–10.5)
Chloride: 103 mEq/L (ref 96–112)
Creatinine, Ser: 1.07 mg/dL (ref 0.50–1.35)
GFR calc Af Amer: 89 mL/min — ABNORMAL LOW (ref >90)
GFR calc non Af Amer: 77 mL/min — ABNORMAL LOW (ref >90)
Glucose, Bld: 167 mg/dL — ABNORMAL HIGH (ref 70–99)
Potassium: 4 mEq/L (ref 3.5–5.1)
Sodium: 137 mEq/L (ref 135–145)

## 2012-01-28 LAB — CBC
HCT: 42.2 % (ref 39.0–52.0)
Hemoglobin: 14.9 g/dL (ref 13.0–17.0)
MCH: 31 pg (ref 26.0–34.0)
MCHC: 35.3 g/dL (ref 30.0–36.0)
MCV: 87.7 fL (ref 78.0–100.0)
Platelets: 267 10*3/uL (ref 150–400)
RBC: 4.81 MIL/uL (ref 4.22–5.81)
RDW: 13.9 % (ref 11.5–15.5)
WBC: 8.3 10*3/uL (ref 4.0–10.5)

## 2012-01-28 LAB — POCT I-STAT TROPONIN I: Troponin i, poc: 0 ng/mL (ref 0.00–0.08)

## 2012-01-28 MED ORDER — ASPIRIN 81 MG PO CHEW: 324.0000 mg | CHEWABLE_TABLET | Freq: Once | ORAL | Status: DC

## 2012-01-28 NOTE — ED Provider Notes (Signed)
History    56 year old male with chest pain. Onset approximately 8:30 this morning. Describes as an ache under his left arm and into his left shoulder. Constant without appreciable exacerbating/relieving factors. Tried nitro spray x2 without change in symptoms. No shortness of breath. Patient with a known history of coronary artery disease. Previous MI. States that current symptoms different than the pain he was having with previous heart attack. Not as severe and not associated with nausea or diaphoresis as was previously. No unusual leg pain or swelling. No ghx of blood clot. No recent immobilization. Ankle surgery, but was back in May. No fever. No cough. Is on aspirin and had 324 mg earlier today. Cardiologist is Verdis Prime.  CSN: [id number]  Arrival date & time 01/28/12  1200   First MD Initiated Contact with Patient 01/28/12 1207      Chief Complaint  Patient presents with  . Chest Pain    (Consider location/radiation/quality/duration/timing/severity/associated sxs/prior treatment) HPI  Past Medical History  Diagnosis Date  . Myocardial infarction 02,6/12  . Hypertension   . Arthritis   . Cancer     hx prostate cancer  . Bladder disorder   . Coronary artery disease     Past Surgical History  Procedure Date  . Carotid stent   . Tonsillectomy   . Fracture surgery     rt wrist  . Toe surgery     rt great toe  . Wrist surgery     rt and lt cysts removed  . Orif ankle fracture 07/04/2011    Procedure: OPEN REDUCTION INTERNAL FIXATION (ORIF) ANKLE FRACTURE;  Surgeon: Harvie Junior, MD;  Location: [facility];  Service: Orthopedics;  Laterality: Right;    No family history on file.  History  Substance Use Topics  . Smoking status: Never Smoker   . Smokeless tobacco: Not on file  . Alcohol Use: Yes     Comment: rare      Review of Systems   Review of symptoms negative unless otherwise noted in HPI.   Allergies  Contrast media  Home  Medications   Current Outpatient Rx  Name  Route  Sig  Dispense  Refill  . ASPIRIN 81 MG PO CHEW   Oral   Chew 162 mg by mouth daily.          . ATORVASTATIN CALCIUM 10 MG PO TABS   Oral   Take 10 mg by mouth daily.         Marland Kitchen DARIFENACIN HYDROBROMIDE ER 15 MG PO TB24   Oral   Take 15 mg by mouth daily.          . DEXLANSOPRAZOLE 60 MG PO CPDR   Oral   Take 60 mg by mouth daily.         Marland Kitchen EZETIMIBE 10 MG PO TABS   Oral   Take 10 mg by mouth daily.         Marland Kitchen HYDROXYZINE HCL 25 MG PO TABS   Oral   Take 50 mg by mouth daily.         . ISOSORBIDE MONONITRATE ER 60 MG PO TB24   Oral   Take 60 mg by mouth daily.         Marland Kitchen METOPROLOL SUCCINATE ER 100 MG PO TB24   Oral   Take 100 mg by mouth daily. Take with or immediately following a meal.         . MIRABEGRON ER 25 MG PO  TB24   Oral   Take 25 mg by mouth daily.         Marland Kitchen NITROGLYCERIN 0.4 MG/SPRAY TL SOLN   Sublingual   Place 1 spray under the tongue every 5 (five) minutes as needed. For chest pain         . VALSARTAN 40 MG PO TABS   Oral   Take 40 mg by mouth daily.           BP 111/80  Pulse 99  Temp 98.9 F (37.2 C) (Oral)  Resp 18  SpO2 97%  Physical Exam  Nursing note and vitals reviewed. Constitutional: He appears well-developed and well-nourished. No distress.  HENT:  Head: Normocephalic and atraumatic.  Eyes: Conjunctivae normal are normal. Right eye exhibits no discharge. Left eye exhibits no discharge.  Neck: Neck supple.  Cardiovascular: Normal rate, regular rhythm and normal heart sounds.  Exam reveals no gallop and no friction rub.   No murmur heard. Pulmonary/Chest: Effort normal and breath sounds normal. No respiratory distress. He exhibits no tenderness.  Abdominal: Soft. He exhibits no distension. There is no tenderness.  Musculoskeletal: He exhibits no edema and no tenderness.       Lower extremities symmetric as compared to each other. No calf tenderness. Negative  Homan's. No palpable cords.   Neurological: He is alert.  Skin: Skin is warm and dry. He is not diaphoretic.  Psychiatric: He has a normal mood and affect. His behavior is normal. Thought content normal.    ED Course  Procedures (including critical care time)  Labs Reviewed  BASIC METABOLIC PANEL - Abnormal; Notable for the following:    Glucose, Bld 167 (*)     GFR calc non Af Amer 77 (*)     GFR calc Af Amer 89 (*)     All other components within normal limits  CBC  POCT I-STAT TROPONIN I  LAB REPORT - SCANNED   Dg Chest 2 View  01/28/2012  *RADIOLOGY REPORT*  Clinical Data: Left chest pain  CHEST - 2 VIEW  Comparison: 08/26/2010  Findings: Lungs are essentially clear.  No focal consolidation. No pleural effusion or pneumothorax.  Cardiomediastinal silhouette is within normal limits.  Mild degenerative changes of the visualized thoracolumbar spine.  IMPRESSION: No evidence of acute cardiopulmonary disease.   Original Report Authenticated By: Charline Bills, M.D.     EKG:  Rhythm: normal sinus Vent. rate 98 BPM PR interval 148 ms QRS duration 88 ms QT/QTc 350/446 ms Axis: normal ST segments: NS ST changes inferiorly and laterally Comparison: stable from previous from 08/26/10  1. Chest pain       MDM  56yM with CP. Atypical for ACS. EKG stable from prior. Trop normal. CXR clear. Doubt infectious or dissection. Pain pleuritic. Consider PE but doubt. No respiratory complaints, tachycardia, hypoxia, or significant risk factors. Plan PRN NSAIDs. Outpt FU as needed. Emergent return precautions discussed.        Raeford Razor, MD 01/29/12 [phone]

## 2012-01-28 NOTE — ED Notes (Signed)
Discharge paperwork given to pt; pt had some questions about his chest pain today; informed RN that he had not talked to MD about discharge;  MD notified and said he would talk to pt in about 5-10 minutes; pt informed and verbalized understanding;

## 2012-01-28 NOTE — ED Notes (Signed)
MD at bedside. 

## 2012-01-28 NOTE — ED Notes (Signed)
Report received from Samantha, RN. 

## 2012-01-28 NOTE — ED Notes (Signed)
Pt has history of mi's.  Today started having left under arm pain that radiates into arm and shoulder.  Pt reports that feeling is discomfort

## 2012-02-03 ENCOUNTER — Encounter (HOSPITAL_COMMUNITY): Payer: Self-pay | Admitting: Nurse Practitioner

## 2012-02-03 ENCOUNTER — Emergency Department (HOSPITAL_COMMUNITY): Payer: 59

## 2012-02-03 ENCOUNTER — Inpatient Hospital Stay (HOSPITAL_COMMUNITY)
Admission: EM | Admit: 2012-02-03 | Discharge: 2012-02-04 | DRG: 287 | Disposition: A | Discharge status: Home / Self Care | Payer: 59 | Attending: Interventional Cardiology | Admitting: Interventional Cardiology

## 2012-02-03 DIAGNOSIS — Z9861 Coronary angioplasty status: Secondary | ICD-10-CM

## 2012-02-03 DIAGNOSIS — E119 Type 2 diabetes mellitus without complications: Secondary | ICD-10-CM

## 2012-02-03 DIAGNOSIS — R072 Precordial pain: Secondary | ICD-10-CM | POA: Diagnosis present

## 2012-02-03 DIAGNOSIS — Z87891 Personal history of nicotine dependence: Secondary | ICD-10-CM

## 2012-02-03 DIAGNOSIS — I10 Essential (primary) hypertension: Secondary | ICD-10-CM | POA: Diagnosis present

## 2012-02-03 DIAGNOSIS — I2510 Atherosclerotic heart disease of native coronary artery without angina pectoris: Principal | ICD-10-CM | POA: Diagnosis present

## 2012-02-03 DIAGNOSIS — I249 Acute ischemic heart disease, unspecified: Secondary | ICD-10-CM | POA: Diagnosis present

## 2012-02-03 DIAGNOSIS — E785 Hyperlipidemia, unspecified: Secondary | ICD-10-CM | POA: Diagnosis present

## 2012-02-03 DIAGNOSIS — R079 Chest pain, unspecified: Secondary | ICD-10-CM

## 2012-02-03 DIAGNOSIS — I200 Unstable angina: Secondary | ICD-10-CM | POA: Diagnosis present

## 2012-02-03 DIAGNOSIS — Z7982 Long term (current) use of aspirin: Secondary | ICD-10-CM

## 2012-02-03 DIAGNOSIS — I252 Old myocardial infarction: Secondary | ICD-10-CM

## 2012-02-03 DIAGNOSIS — E118 Type 2 diabetes mellitus with unspecified complications: Secondary | ICD-10-CM | POA: Diagnosis present

## 2012-02-03 LAB — POCT I-STAT TROPONIN I: Troponin i, poc: 0.01 ng/mL (ref 0.00–0.08)

## 2012-02-03 LAB — CBC
MCV: 88.6 fL (ref 78.0–100.0)
Platelets: 260 10*3/uL (ref 150–400)
RBC: 4.81 MIL/uL (ref 4.22–5.81)
RDW: 14.1 % (ref 11.5–15.5)
WBC: 10.8 10*3/uL — ABNORMAL HIGH (ref 4.0–10.5)

## 2012-02-03 LAB — BASIC METABOLIC PANEL
CO2: 26 mEq/L (ref 19–32)
Calcium: 9.1 mg/dL (ref 8.4–10.5)
Chloride: 105 mEq/L (ref 96–112)
Glucose, Bld: 137 mg/dL — ABNORMAL HIGH (ref 70–99)
Potassium: 3.7 mEq/L (ref 3.5–5.1)
Sodium: 140 mEq/L (ref 135–145)

## 2012-02-03 LAB — TROPONIN I: Troponin I: <0.3 ng/mL (ref <0.30)

## 2012-02-03 LAB — PRO B NATRIURETIC PEPTIDE: Pro B Natriuretic peptide (BNP): 39.3 pg/mL (ref 0–125)

## 2012-02-03 MED ORDER — ACETAMINOPHEN 325 MG PO TABS: 650.0000 mg | ORAL_TABLET | ORAL | Status: DC | PRN

## 2012-02-03 MED ORDER — ASPIRIN EC 81 MG PO TBEC
81.0000 mg | DELAYED_RELEASE_TABLET | Freq: Every day | ORAL | Status: DC
  Filled 2012-02-03: qty 1

## 2012-02-03 MED ORDER — NITROGLYCERIN 0.4 MG/SPRAY TL SOLN
1.0000 | Status: DC | PRN
  Filled 2012-02-03: qty 4.9

## 2012-02-03 MED ORDER — METOPROLOL SUCCINATE ER 100 MG PO TB24
100.0000 mg | ORAL_TABLET | Freq: Every day | ORAL | Status: DC
  Administered 2012-02-04: 100 mg via ORAL
  Filled 2012-02-03: qty 1

## 2012-02-03 MED ORDER — MORPHINE SULFATE 4 MG/ML IJ SOLN
4.0000 mg | Freq: Once | INTRAMUSCULAR | Status: AC
  Administered 2012-02-03: 4 mg via INTRAVENOUS
  Filled 2012-02-03: qty 1

## 2012-02-03 MED ORDER — NITROGLYCERIN 0.4 MG SL SUBL
0.4000 mg | SUBLINGUAL_TABLET | SUBLINGUAL | Status: DC | PRN
  Administered 2012-02-03: 0.4 mg via SUBLINGUAL
  Filled 2012-02-03: qty 25

## 2012-02-03 MED ORDER — ASPIRIN 81 MG PO CHEW: 162.0000 mg | CHEWABLE_TABLET | Freq: Every day | ORAL | Status: DC

## 2012-02-03 MED ORDER — EZETIMIBE 10 MG PO TABS
10.0000 mg | ORAL_TABLET | Freq: Every day | ORAL | Status: DC
  Administered 2012-02-04: 10 mg via ORAL
  Filled 2012-02-03: qty 1

## 2012-02-03 MED ORDER — ISOSORBIDE MONONITRATE ER 60 MG PO TB24
60.0000 mg | ORAL_TABLET | Freq: Every day | ORAL | Status: DC
  Administered 2012-02-04: 60 mg via ORAL
  Filled 2012-02-03: qty 1

## 2012-02-03 MED ORDER — SODIUM CHLORIDE 0.9 % IV SOLN
Freq: Once | INTRAVENOUS | Status: AC
  Administered 2012-02-03: 15:00:00 via INTRAVENOUS

## 2012-02-03 MED ORDER — HYDROXYZINE HCL 50 MG PO TABS
50.0000 mg | ORAL_TABLET | Freq: Every day | ORAL | Status: DC
  Administered 2012-02-04: 50 mg via ORAL
  Filled 2012-02-03: qty 1

## 2012-02-03 MED ORDER — MIRABEGRON ER 25 MG PO TB24
25.0000 mg | ORAL_TABLET | Freq: Every day | ORAL | Status: DC
  Administered 2012-02-03: 25 mg via ORAL
  Filled 2012-02-03 (×2): qty 1

## 2012-02-03 MED ORDER — IRBESARTAN 75 MG PO TABS
75.0000 mg | ORAL_TABLET | Freq: Every day | ORAL | Status: DC
  Administered 2012-02-04: 75 mg via ORAL
  Filled 2012-02-03: qty 1

## 2012-02-03 MED ORDER — ATORVASTATIN CALCIUM 10 MG PO TABS
10.0000 mg | ORAL_TABLET | Freq: Every day | ORAL | Status: DC
  Administered 2012-02-03: 10 mg via ORAL
  Filled 2012-02-03 (×3): qty 1

## 2012-02-03 MED ORDER — DARIFENACIN HYDROBROMIDE ER 15 MG PO TB24
15.0000 mg | ORAL_TABLET | Freq: Every day | ORAL | Status: DC
  Administered 2012-02-04: 15 mg via ORAL
  Filled 2012-02-03: qty 1

## 2012-02-03 MED ORDER — ONDANSETRON HCL 4 MG/2ML IJ SOLN: 4.0000 mg | Freq: Four times a day (QID) | INTRAMUSCULAR | Status: DC | PRN

## 2012-02-03 MED ORDER — ATORVASTATIN CALCIUM 10 MG PO TABS: 10.0000 mg | ORAL_TABLET | Freq: Every day | ORAL | Status: DC

## 2012-02-03 MED ORDER — MIRABEGRON ER 25 MG PO TB24: 25.0000 mg | ORAL_TABLET | Freq: Every day | ORAL | Status: DC

## 2012-02-03 NOTE — ED Notes (Signed)
Pt reports he was here monady for CP and discharged home, then today he was arguing with his teenager and onset sharp pain across entire chest and SOB. Took 2 nitro sprays, 324 mg asa with no change in pain. A&Ox4

## 2012-02-03 NOTE — ED Notes (Signed)
Pt presents to department for evaluation of diffuse chest pain. Onset this morning after church @11 :30am. States pain across chest to bilateral arms and upper back. 6/10 pain at the time. Also states SOB. Lung sounds clear and equal bilaterally. Respirations unlabored. Skin warm and dry. He is conscious alert and oriented x4.

## 2012-02-03 NOTE — ED Provider Notes (Signed)
Medical screening examination/treatment/procedure(s) were conducted as a shared visit with non-physician practitioner(s) and myself.  I personally evaluated the patient during the encounter  Pt has history of NSTEMI s/p cardiac cath that did not reveal any culprit lesions in 2012.  Pt has continued with medical management.  He had an episode of chest pain earlier this week on 11/26.  Seen in the ED and released.  Pt had another episode of pain today after an argument.  He feels this pain is different than earlier in the week, went around his chest and is somewhat similar to his prior cardiac problems.  Will check cardiac enzymes, consult with his cardiologist.  Celene Kras, MD 02/03/12 [phone]

## 2012-02-03 NOTE — ED Provider Notes (Signed)
History     CSN: [id number]  Arrival date & time 02/03/12  1311   First MD Initiated Contact with Patient 02/03/12 1404      Chief Complaint  Patient presents with  . Chest Pain    (Consider location/radiation/quality/duration/timing/severity/associated sxs/prior treatment) HPI Patient presents emergency department with chest pain, that started this afternoon.  Patient, states he was arguing with his 54 year old son when the chest pain began.  Patient, states that the pain radiates from his left shoulder to his right shoulder.  Patient, states, that he's had some mild shortness of breath.  Patient denies nausea, vomiting, abdominal pain, headache, visual changes, weakness, numbness, dizziness, or syncope.  Patient, states he took 2 sprays of nitroglycerin prior to arrival, without relief.  Patient did take 324 aspirin at home as well. Past Medical History  Diagnosis Date  . Myocardial infarction 02,6/12  . Hypertension   . Arthritis   . Cancer     hx prostate cancer  . Bladder disorder   . Coronary artery disease     Past Surgical History  Procedure Date  . Carotid stent   . Tonsillectomy   . Fracture surgery     rt wrist  . Toe surgery     rt great toe  . Wrist surgery     rt and lt cysts removed  . Orif ankle fracture 07/04/2011    Procedure: OPEN REDUCTION INTERNAL FIXATION (ORIF) ANKLE FRACTURE;  Surgeon: Harvie Junior, MD;  Location: [facility];  Service: Orthopedics;  Laterality: Right;    History reviewed. No pertinent family history.  History  Substance Use Topics  . Smoking status: Never Smoker   . Smokeless tobacco: Not on file  . Alcohol Use: Yes     Comment: rare      Review of Systems All other systems negative except as documented in the HPI. All pertinent positives and negatives as reviewed in the HPI.  Allergies  Contrast media  Home Medications   Current Outpatient Rx  Name  Route  Sig  Dispense  Refill  . ASPIRIN 81 MG PO  CHEW   Oral   Chew 162 mg by mouth daily.          . ATORVASTATIN CALCIUM 10 MG PO TABS   Oral   Take 10 mg by mouth daily.         Marland Kitchen DARIFENACIN HYDROBROMIDE ER 15 MG PO TB24   Oral   Take 15 mg by mouth daily.          . DEXLANSOPRAZOLE 60 MG PO CPDR   Oral   Take 60 mg by mouth daily.         Marland Kitchen EZETIMIBE 10 MG PO TABS   Oral   Take 10 mg by mouth daily.         Marland Kitchen HYDROXYZINE HCL 25 MG PO TABS   Oral   Take 50 mg by mouth daily.         . ISOSORBIDE MONONITRATE ER 60 MG PO TB24   Oral   Take 60 mg by mouth daily.         Marland Kitchen METOPROLOL SUCCINATE ER 100 MG PO TB24   Oral   Take 100 mg by mouth daily. Take with or immediately following a meal.         . MIRABEGRON ER 25 MG PO TB24   Oral   Take 25 mg by mouth daily.         Marland Kitchen  NITROGLYCERIN 0.4 MG/SPRAY TL SOLN   Sublingual   Place 1 spray under the tongue every 5 (five) minutes as needed. For chest pain         . VALSARTAN 40 MG PO TABS   Oral   Take 40 mg by mouth daily.           BP 126/93  Pulse 81  Temp 97.9 F (36.6 C) (Oral)  Resp 28  SpO2 98%  Physical Exam  Nursing note and vitals reviewed. Constitutional: He is oriented to person, place, and time. He appears well-developed and well-nourished. No distress.  HENT:  Head: Normocephalic and atraumatic.  Mouth/Throat: Oropharynx is clear and moist.  Eyes: Pupils are equal, round, and reactive to light.  Neck: Normal range of motion. Neck supple.  Cardiovascular: Normal rate, regular rhythm and normal heart sounds.  Exam reveals no gallop and no friction rub.   No murmur heard. Pulmonary/Chest: Effort normal and breath sounds normal. No respiratory distress.  Abdominal: Soft. Bowel sounds are normal. He exhibits no distension. There is no tenderness. There is no guarding.  Neurological: He is alert and oriented to person, place, and time.  Skin: Skin is warm and dry.    ED Course  Procedures (including critical care  time)  Labs Reviewed  CBC - Abnormal; Notable for the following:    WBC 10.8 (*)     MCHC 36.2 (*)     All other components within normal limits  BASIC METABOLIC PANEL - Abnormal; Notable for the following:    Glucose, Bld 137 (*)     GFR calc non Af Amer 61 (*)     GFR calc Af Amer 70 (*)     All other components within normal limits  PRO B NATRIURETIC PEPTIDE  POCT I-STAT TROPONIN I   Dg Chest 2 View  02/03/2012  *RADIOLOGY REPORT*  Clinical Data: Chest pain.  Shortness of breath.  History of coronary artery disease with prior MI.  Current history of hypertension.  CHEST - 2 VIEW  Comparison: Two-view chest x-ray 01/28/2012, 08/26/2010, 07/14/2008.  Findings: Cardiomediastinal silhouette unremarkable, unchanged. Suboptimal inspiration with mild atelectasis in the lung bases, left greater than right.  Lungs otherwise clear.  Pulmonary vascularity normal.  No pleural effusions.  Visualized bony thorax intact.  IMPRESSION: Suboptimal inspiration accounts for mild bibasilar atelectasis, left greater than right.  No acute cardiopulmonary disease otherwise.   Original Report Authenticated By: Hulan Saas, M.D.    Spoke with Dr. Antoine Poche about the patient, he'll be down to evaluate the patient for admission.     MDM    Date: 02/03/2012  Rate: 92  Rhythm: normal sinus rhythm  QRS Axis: normal  Intervals: normal  ST/T Wave abnormalities: nonspecific T wave changes  Conduction Disutrbances:none  Narrative Interpretation:   Old EKG Reviewed: unchanged         Carlyle Dolly, PA-C 02/03/12 1546

## 2012-02-03 NOTE — H&P (Signed)
CARDIOLOGY ADMISSION NOTE  Patient ID: [name] MRN: [id number] DOB/AGE: Dec 11, 1955 56 y.o.  Admit date: 02/03/2012 Primary Physician   Gwynneth Aliment, MD Primary Cardiologist   Dr. Garnette Scheuermann Chief Complaint    Chest pain  HPI:  The patient has an extensive history of coronary disease. In 2002 had a myocardial infarction and underwent stenting 2 and OM. In 2006 he had stenting of his right coronary artery. Most recent catheterization in June of 2012 demonstrated an EF of 55%. He had a widely patent circumflex stent. The right coronary artery stent 20-30% stenosis.    He has been somewhat limited in physical activity having broken his ankle and having had surgical repair. However, with his limited activities he's been doing relatively well. Today he got into a stressful and physical argument with his 50 year old son. With this he had some chest discomfort. It is across his neck and left arm. He was into his left shoulder. He had had some shoulder discomfort a few days prior to this. He has a mild shortness of breath but no other associated symptoms. There was some mild discomfort with movement or deep breathing. He took sublingual nitroglycerin x2 without relief. He took baby aspirin. The pain was 6/7 in intensity and somewhat similar to perhaps his initial discomfort in 2002.  He presented to the emergency room where he was not found to have any acute EKG changes. His pain improved with morphine. Enzymes have thus far been negative.   Past Medical History  Diagnosis Date  . Myocardial infarction 02,6/12  . Hypertension   . Arthritis   . Cancer     hx prostate cancer  . Bladder disorder   . Coronary artery disease     Past Surgical History  Procedure Date  . Carotid stent   . Tonsillectomy   . Fracture surgery     rt wrist  . Toe surgery     rt great toe  . Wrist surgery     rt and lt cysts removed  . Orif ankle fracture 07/04/2011    Procedure: OPEN REDUCTION INTERNAL  FIXATION (ORIF) ANKLE FRACTURE;  Surgeon: Harvie Junior, MD;  [facility];  Service: Orthopedics;  Laterality: Right;    Allergies  Allergen Reactions  . Contrast Media (Iodinated Diagnostic Agents) Other (See Comments)    Reaction unknown   No current facility-administered medications on file prior to encounter.   Current Outpatient Prescriptions on File Prior to Encounter  Medication Sig Dispense Refill  . aspirin 81 MG chewable tablet Chew 162 mg by mouth daily.       Marland Kitchen atorvastatin (LIPITOR) 10 MG tablet Take 10 mg by mouth daily.      Marland Kitchen darifenacin (ENABLEX) 15 MG 24 hr tablet Take 15 mg by mouth daily.       Marland Kitchen dexlansoprazole (DEXILANT) 60 MG capsule Take 60 mg by mouth daily.      Marland Kitchen ezetimibe (ZETIA) 10 MG tablet Take 10 mg by mouth daily.      . hydrOXYzine (ATARAX/VISTARIL) 25 MG tablet Take 50 mg by mouth daily.      . isosorbide mononitrate (IMDUR) 60 MG 24 hr tablet Take 60 mg by mouth daily.      . metoprolol succinate (TOPROL-XL) 100 MG 24 hr tablet Take 100 mg by mouth daily. Take with or immediately following a meal.      . Mirabegron ER (MYRBETRIQ) 25 MG TB24 Take 25 mg by mouth daily.      Marland Kitchen  nitroGLYCERIN (NITROLINGUAL) 0.4 MG/SPRAY spray Place 1 spray under the tongue every 5 (five) minutes as needed. For chest pain      . valsartan (DIOVAN) 40 MG tablet Take 40 mg by mouth daily.       History   Social History  . Marital Status: Married    Spouse Name: N/A    Number of Children: N/A  . Years of Education: N/A   Occupational History  . Not on file.   Social History Main Topics  . Smoking status: Former Smoker    Types: Cigarettes  . Smokeless tobacco: Not on file     Comment: 2002  . Alcohol Use: Yes     Comment: rare  . Drug Use: No  . Sexually Active: Not on file   Other Topics Concern  . Not on file   Social History Narrative  . No narrative on file    Family History  Problem Relation Age of Onset  . Cancer Mother      Liver    ROS:  Arthritis.  Otherwise as stated in the HPI and negative for all other systems.  Physical Exam: Blood pressure 126/84, pulse 77, temperature 97.9 F (36.6 C), temperature source Oral, resp. rate 15, SpO2 99.00%.  GENERAL:  Well appearing HEENT:  Pupils equal round and reactive, fundi not visualized, oral mucosa unremarkable NECK:  No jugular venous distention, waveform within normal limits, carotid upstroke brisk and symmetric, no bruits, no thyromegaly LYMPHATICS:  No cervical, inguinal adenopathy LUNGS:  Clear to auscultation bilaterally BACK:  No CVA tenderness CHEST:  Unremarkable HEART:  PMI not displaced or sustained,S1 and S2 within normal limits, no S3, no S4, no clicks, no rubs, no murmurs ABD:  Flat, positive bowel sounds normal in frequency in pitch, no bruits, no rebound, no guarding, no midline pulsatile mass, no hepatomegaly, no splenomegaly EXT:  2 plus pulses throughout, no edema, no cyanosis no clubbing SKIN:  No rashes no nodules NEURO:  Cranial nerves II through XII grossly intact, motor grossly intact throughout PSYCH:  Cognitively intact, oriented to person place and time  Labs: Lab Results  Component Value Date   BUN 8 02/03/2012   Lab Results  Component Value Date   CREATININE 1.30 02/03/2012   Lab Results  Component Value Date   NA 140 02/03/2012   K 3.7 02/03/2012   CL 105 02/03/2012   CO2 26 02/03/2012    Lab Results  Component Value Date   WBC 10.8* 02/03/2012   HGB 15.4 02/03/2012   HCT 42.6 02/03/2012   MCV 88.6 02/03/2012   PLT 260 02/03/2012     Radiology:   [id number] inspiration accounts for mild bibasilar atelectasis,  left greater than right. No acute cardiopulmonary disease  Otherwise.   EKG:  NSR, rate 97, early transition. No acute ST T wave changes.  02/03/2012   ASSESSMENT AND PLAN:    Chest pain: Some worrisome features with an extensive past history. And plan on observing him overnight. I would suggest an  enzymes and followup EKG are unremarkable and he has no further pain that he could get an outpatient stress test. With his ankle pain Lexiscan Myoview.  Hyperglycemia: He says this is followed by Gwynneth Aliment, MD and we will defer management to her.  Dyslipidemia: He thinks this is well controlled and he is followed closely.  SignedRollene Rotunda 02/03/2012, 4:47 PM

## 2012-02-03 NOTE — ED Notes (Signed)
NS called service response and asked for dinner tray to be brought to 2005.

## 2012-02-04 ENCOUNTER — Encounter (HOSPITAL_COMMUNITY)
Admission: EM | Disposition: A | Discharge status: Home / Self Care | Payer: Self-pay | Source: Home / Self Care | Attending: Interventional Cardiology

## 2012-02-04 DIAGNOSIS — E785 Hyperlipidemia, unspecified: Secondary | ICD-10-CM | POA: Diagnosis present

## 2012-02-04 DIAGNOSIS — I249 Acute ischemic heart disease, unspecified: Secondary | ICD-10-CM | POA: Diagnosis present

## 2012-02-04 DIAGNOSIS — I10 Essential (primary) hypertension: Secondary | ICD-10-CM | POA: Diagnosis present

## 2012-02-04 DIAGNOSIS — E118 Type 2 diabetes mellitus with unspecified complications: Secondary | ICD-10-CM | POA: Diagnosis present

## 2012-02-04 HISTORY — PX: LEFT HEART CATHETERIZATION WITH CORONARY ANGIOGRAM: SHX5451

## 2012-02-04 LAB — CBC
HCT: 41.1 % (ref 39.0–52.0)
MCV: 88.8 fL (ref 78.0–100.0)
RBC: 4.63 MIL/uL (ref 4.22–5.81)
WBC: 9.2 10*3/uL (ref 4.0–10.5)

## 2012-02-04 LAB — TROPONIN I
Troponin I: 1.15 ng/mL (ref <0.30)
Troponin I: 1.78 ng/mL (ref <0.30)

## 2012-02-04 SURGERY — LEFT HEART CATHETERIZATION WITH CORONARY ANGIOGRAM
Anesthesia: LOCAL

## 2012-02-04 MED ORDER — HEPARIN BOLUS VIA INFUSION
4000.0000 [IU] | Freq: Once | INTRAVENOUS | Status: AC
  Administered 2012-02-04: 4000 [IU] via INTRAVENOUS
  Filled 2012-02-04: qty 4000

## 2012-02-04 MED ORDER — HEPARIN SODIUM (PORCINE) 1000 UNIT/ML IJ SOLN
INTRAMUSCULAR | Status: AC
  Filled 2012-02-04: qty 1

## 2012-02-04 MED ORDER — VERAPAMIL HCL 2.5 MG/ML IV SOLN
INTRAVENOUS | Status: AC
  Filled 2012-02-04: qty 2

## 2012-02-04 MED ORDER — HEPARIN (PORCINE) IN NACL 2-0.9 UNIT/ML-% IJ SOLN
INTRAMUSCULAR | Status: AC
  Filled 2012-02-04: qty 1000

## 2012-02-04 MED ORDER — SODIUM CHLORIDE 0.9 % IV SOLN
1.0000 mL/kg/h | INTRAVENOUS | Status: DC
  Administered 2012-02-04: 1 mL/kg/h via INTRAVENOUS

## 2012-02-04 MED ORDER — METHYLPREDNISOLONE SODIUM SUCC 125 MG IJ SOLR
125.0000 mg | INTRAMUSCULAR | Status: AC
  Administered 2012-02-04: 125 mg via INTRAVENOUS
  Filled 2012-02-04: qty 2

## 2012-02-04 MED ORDER — ONDANSETRON HCL 4 MG/2ML IJ SOLN: 4.0000 mg | Freq: Four times a day (QID) | INTRAMUSCULAR | Status: DC | PRN

## 2012-02-04 MED ORDER — NITROGLYCERIN 0.4 MG/SPRAY TL SOLN: 1.0000 | Status: DC | PRN

## 2012-02-04 MED ORDER — ASPIRIN EC 81 MG PO TBEC: 81.0000 mg | DELAYED_RELEASE_TABLET | Freq: Every day | ORAL | Status: DC

## 2012-02-04 MED ORDER — ACETAMINOPHEN 325 MG PO TABS: 650.0000 mg | ORAL_TABLET | ORAL | Status: DC | PRN

## 2012-02-04 MED ORDER — MIDAZOLAM HCL 2 MG/2ML IJ SOLN
INTRAMUSCULAR | Status: AC
  Filled 2012-02-04: qty 2

## 2012-02-04 MED ORDER — SODIUM CHLORIDE 0.9 % IV SOLN: 250.0000 mL | INTRAVENOUS | Status: DC | PRN

## 2012-02-04 MED ORDER — ASPIRIN 81 MG PO CHEW: 324.0000 mg | CHEWABLE_TABLET | ORAL | Status: DC

## 2012-02-04 MED ORDER — HEPARIN (PORCINE) IN NACL 100-0.45 UNIT/ML-% IJ SOLN
1200.0000 [IU]/h | INTRAMUSCULAR | Status: DC
  Administered 2012-02-04: 1200 [IU]/h via INTRAVENOUS
  Filled 2012-02-04 (×2): qty 250

## 2012-02-04 MED ORDER — DIPHENHYDRAMINE HCL 50 MG/ML IJ SOLN
25.0000 mg | INTRAMUSCULAR | Status: AC
Start: 2012-02-04 — End: 2012-02-04
  Administered 2012-02-04: 25 mg via INTRAVENOUS
  Filled 2012-02-04: qty 1

## 2012-02-04 MED ORDER — CLOPIDOGREL BISULFATE 75 MG PO TABS: 75.0000 mg | ORAL_TABLET | Freq: Every day | ORAL | Status: DC

## 2012-02-04 MED ORDER — ASPIRIN 81 MG PO CHEW
324.0000 mg | CHEWABLE_TABLET | ORAL | Status: AC
  Administered 2012-02-04: 324 mg via ORAL
  Filled 2012-02-04: qty 4

## 2012-02-04 MED ORDER — LIDOCAINE HCL (PF) 1 % IJ SOLN
INTRAMUSCULAR | Status: AC
  Filled 2012-02-04: qty 30

## 2012-02-04 MED ORDER — NITROGLYCERIN 0.2 MG/ML ON CALL CATH LAB
INTRAVENOUS | Status: AC
  Filled 2012-02-04: qty 1

## 2012-02-04 MED ORDER — SODIUM CHLORIDE 0.9 % IJ SOLN: 3.0000 mL | Freq: Two times a day (BID) | INTRAMUSCULAR | Status: DC

## 2012-02-04 MED ORDER — FAMOTIDINE IN NACL 20-0.9 MG/50ML-% IV SOLN
20.0000 mg | INTRAVENOUS | Status: AC
  Administered 2012-02-04: 20 mg via INTRAVENOUS
  Filled 2012-02-04: qty 50

## 2012-02-04 MED ORDER — CLOPIDOGREL BISULFATE 75 MG PO TABS
75.0000 mg | ORAL_TABLET | Freq: Every day | ORAL | Status: DC
  Administered 2012-02-04: 16:00:00 75 mg via ORAL
  Filled 2012-02-04: qty 1

## 2012-02-04 MED ORDER — SODIUM CHLORIDE 0.9 % IV SOLN
INTRAVENOUS | Status: DC
  Administered 2012-02-04: 13:00:00 via INTRAVENOUS

## 2012-02-04 MED ORDER — SODIUM CHLORIDE 0.9 % IJ SOLN: 3.0000 mL | INTRAMUSCULAR | Status: DC | PRN

## 2012-02-04 MED ORDER — FENTANYL CITRATE 0.05 MG/ML IJ SOLN
INTRAMUSCULAR | Status: AC
  Filled 2012-02-04: qty 2
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
## 2012-02-04 NOTE — Progress Notes (Signed)
Utilization Review Completed.   Shawnetta Lein, RN, BSN Nurse Case Manager  [phone]  
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
## 2012-02-04 NOTE — Progress Notes (Signed)
CRITICAL VALUE ALERT  Critical value received:  Troponin  Date of notification:  02-04-2012  Time of notification:  0020  Critical value read back:yes  Nurse who received alert:  Sallee Lange RN  MD notified (1st page):  Donnie Aho  Time of first page:  0025  MD notified (2nd page):  Time of second page:  Responding MD:  Donnie Aho  Time MD responded:  [phone]

## 2012-02-04 NOTE — Progress Notes (Signed)
Patient Name: [name] Date of Encounter: 02/04/2012     SUBJECTIVE: Vague complaints and now with elevated cardiac makers.  TELEMETRY:  NSR: Filed Vitals:   02/03/12 1800 02/03/12 1833 02/03/12 2100 02/04/12 0500  BP: 135/82 132/87 131/78 130/86  Pulse: 69 72 72 72  Temp:  98.1 F (36.7 C) 97.4 F (36.3 C) 97.7 F (36.5 C)  TempSrc:  Oral    Resp: 17 18 18 16   Height:  5\' 8"  (1.727 m)    Weight:  96.673 kg (213 lb 2 oz)  93.214 kg (205 lb 8 oz)  SpO2: 98% 98% 96% 96%    Intake/Output Summary (Last 24 hours) at 02/04/12 0804 Last data filed at 02/04/12 0734  Gross per 24 hour  Intake  299.6 ml  Output      0 ml  Net  299.6 ml    LABS: Basic Metabolic Panel:  Basename 02/03/12 1345  NA 140  K 3.7  CL 105  CO2 26  GLUCOSE 137*  BUN 8  CREATININE 1.30  CALCIUM 9.1  MG --  PHOS --   CBC:  Basename 02/03/12 1345  WBC 10.8*  NEUTROABS --  HGB 15.4  HCT 42.6  MCV 88.6  PLT 260   Cardiac Enzymes:  Basename 02/04/12 0524 02/03/12 2307 02/03/12 1745  CKTOTAL -- -- --  CKMB -- -- --  CKMBINDEX -- -- --  TROPONINI 1.78* 1.15* <0.30   Radiology/Studies:  Atelectasis otherwise okay  Physical Exam: Blood pressure 130/86, pulse 72, temperature 97.7 F (36.5 C), temperature source Oral, resp. rate 16, height 5\' 8"  (1.727 m), weight 93.214 kg (205 lb 8 oz), SpO2 96.00%. Weight change:    Unremarkable.  ASSESSMENT:  1. ACS  2. Contrast allergy  Plan:  1. Cath today  2. Needs therapy for contrast allergy  Signed, Lesleigh Noe 02/04/2012, 8:04 AM

## 2012-02-04 NOTE — CV Procedure (Addendum)
     Diagnostic Cardiac Catheterization Report  [name]  56 y.o.  male 02/07/1956  Procedure Date: 02/04/2012 Referring Physician:  Mendel Ryder, MD Primary Cardiologist:: H.W.B.Katrinka Blazing III, MD   PROCEDURE:  Left heart catheterization with selective coronary angiography, left ventriculogram.  INDICATIONS:  Acute coronary syndrome the note about prolonged chest pain and positive troponin I ischemic markers. Normal EKG.  The risks, benefits, and details of the procedure were explained to the patient.  The patient verbalized understanding and wanted to proceed.  Informed written consent was obtained.  PROCEDURE TECHNIQUE:  After Xylocaine anesthesia a 5 French sheath was placed in the right radial artery with a single anterior needle wall stick.   Coronary angiography was done using a 5 [place] A2 MP catheter.  Left ventriculography was done using the same catheter.    CONTRAST:  Total of 85 cc.  COMPLICATIONS:  None.    HEMODYNAMICS:  Aortic pressure was 112/76 mmHg; LV pressure was 113/12 mmHg; LVEDP 19 mm mercury.  There was no gradient between the left ventricle and aorta.    ANGIOGRAPHIC DATA:   The left main coronary artery is widely patent.  The left anterior descending artery is dominant. It wraps around the left ventricular apex. Distal vessel intramyocardial segment with systolic compression is noted. 2 large diagonals contain minimal ostial disease. No significant obstruction is seen throughout the LAD territory.  The ramus intermedius contains proximal/ostial overlapping stents. This region is widely patent with less than 50% narrowing noted at the site of overlap. The vessel bifurcates distal to the stented proximal region..  The left circumflex artery is small and widely patent.  The right coronary artery is dominant giving the PDA and 3 small left ventricular branches. Irregularities are noted throughout the mid segment. The mid RCA stent is widely patent.  LEFT  VENTRICULOGRAM:  Left ventricular angiogram was done in the 30 RAO projection and revealed normal left ventricular wall motion and systolic function with an estimated ejection fraction of 50%.    IMPRESSIONS:  1. Acute coronary syndrome likely related to coronary artery spasm and/or transient thrombotic event. No high grade obstructive disease is noted.  2. Widely patent ramus intermedius stents. Widely patent mid right coronary stent.  3. Low normal left ventricular function   RECOMMENDATION:  1. Long-acting nitrates will be continued and plavix added for 1 month.

## 2012-02-04 NOTE — Progress Notes (Signed)
ANTICOAGULATION CONSULT NOTE   Pharmacy Consult for Heparin Indication: chest pain/ACS  Allergies  Allergen Reactions  . Contrast Media (Iodinated Diagnostic Agents) Other (See Comments)    Reaction unknown    Patient Measurements: Height: 5\' 8"  (172.7 cm) Weight: 205 lb 8 oz (93.214 kg) IBW/kg (Calculated) : 68.4  Heparin Dosing Weight: 90 kg  Vital Signs: Temp: 97.7 F (36.5 C) (12/02 0500) BP: 130/86 mmHg (12/02 0500) Pulse Rate: 72  (12/02 0500)  Labs:  Basename 02/04/12 0755 02/04/12 0524 02/03/12 2307 02/03/12 1745 02/03/12 1345  HGB 14.7 -- -- -- 15.4  HCT 41.1 -- -- -- 42.6  PLT 253 -- -- -- 260  APTT -- -- -- -- --  LABPROT 13.9 -- -- -- --  INR 1.08 -- -- -- --  HEPARINUNFRC 0.38 -- -- -- --  CREATININE -- -- -- -- 1.30  CKTOTAL -- -- -- -- --  CKMB -- -- -- -- --  TROPONINI -- 1.78* 1.15* <0.30 --    Estimated Creatinine Clearance: 71.9 ml/min (by C-G formula based on Cr of 1.3).   Medical History: Past Medical History  Diagnosis Date  . Myocardial infarction 02,6/12  . Hypertension   . Arthritis   . Cancer     hx prostate cancer  . Bladder disorder   . Coronary artery disease     Medications:  Prescriptions prior to admission  Medication Sig Dispense Refill  . aspirin 81 MG chewable tablet Chew 162 mg by mouth daily.       Marland Kitchen atorvastatin (LIPITOR) 10 MG tablet Take 10 mg by mouth daily.      Marland Kitchen darifenacin (ENABLEX) 15 MG 24 hr tablet Take 15 mg by mouth daily.       Marland Kitchen dexlansoprazole (DEXILANT) 60 MG capsule Take 60 mg by mouth daily.      Marland Kitchen ezetimibe (ZETIA) 10 MG tablet Take 10 mg by mouth daily.      . hydrOXYzine (ATARAX/VISTARIL) 25 MG tablet Take 50 mg by mouth daily.      . isosorbide mononitrate (IMDUR) 60 MG 24 hr tablet Take 60 mg by mouth daily.      . metoprolol succinate (TOPROL-XL) 100 MG 24 hr tablet Take 100 mg by mouth daily. Take with or immediately following a meal.      . Mirabegron ER (MYRBETRIQ) 25 MG TB24 Take 25 mg  by mouth daily.      . nitroGLYCERIN (NITROLINGUAL) 0.4 MG/SPRAY spray Place 1 spray under the tongue every 5 (five) minutes as needed. For chest pain      . valsartan (DIOVAN) 40 MG tablet Take 40 mg by mouth daily.        Assessment: 56 yo male with chest pain, elevated cardiac markers, therapeutic on heparin drip   Goal of Therapy:  Heparin level 0.3-0.7 units/ml Monitor platelets by anticoagulation protocol: Yes   Plan:  Repeat HL later today to confirm   Kandice Schmelter Poteet 02/04/2012,9:20 AM

## 2012-02-04 NOTE — Progress Notes (Signed)
TR BAND REMOVAL  LOCATION:  right radial  DEFLATED PER PROTOCOL:  yes  TIME BAND OFF / DRESSING APPLIED:   1600   SITE UPON ARRIVAL:   Level 0  SITE AFTER BAND REMOVAL:  Level 0  REVERSE ALLEN'S TEST:    positive  CIRCULATION SENSATION AND MOVEMENT:  Within Normal Limits  yes  COMMENTS:    

## 2012-02-04 NOTE — Progress Notes (Signed)
ANTICOAGULATION CONSULT NOTE - Initial Consult  Pharmacy Consult for Heparin Indication: chest pain/ACS  Allergies  Allergen Reactions  . Contrast Media (Iodinated Diagnostic Agents) Other (See Comments)    Reaction unknown    Patient Measurements: Height: 5\' 8"  (172.7 cm) Weight: 213 lb 2 oz (96.673 kg) IBW/kg (Calculated) : 68.4  Heparin Dosing Weight: 90 kg  Vital Signs: Temp: 97.4 F (36.3 C) (12/01 2100) Temp src: Oral (12/01 1833) BP: 131/78 mmHg (12/01 2100) Pulse Rate: 72  (12/01 2100)  Labs:  Basename 02/03/12 2307 02/03/12 1745 02/03/12 1345  HGB -- -- 15.4  HCT -- -- 42.6  PLT -- -- 260  APTT -- -- --  LABPROT -- -- --  INR -- -- --  HEPARINUNFRC -- -- --  CREATININE -- -- 1.30  CKTOTAL -- -- --  CKMB -- -- --  TROPONINI 1.15* <0.30 --    Estimated Creatinine Clearance: 73.2 ml/min (by C-G formula based on Cr of 1.3).   Medical History: Past Medical History  Diagnosis Date  . Myocardial infarction 02,6/12  . Hypertension   . Arthritis   . Cancer     hx prostate cancer  . Bladder disorder   . Coronary artery disease     Medications:  Prescriptions prior to admission  Medication Sig Dispense Refill  . aspirin 81 MG chewable tablet Chew 162 mg by mouth daily.       Marland Kitchen atorvastatin (LIPITOR) 10 MG tablet Take 10 mg by mouth daily.      Marland Kitchen darifenacin (ENABLEX) 15 MG 24 hr tablet Take 15 mg by mouth daily.       Marland Kitchen dexlansoprazole (DEXILANT) 60 MG capsule Take 60 mg by mouth daily.      Marland Kitchen ezetimibe (ZETIA) 10 MG tablet Take 10 mg by mouth daily.      . hydrOXYzine (ATARAX/VISTARIL) 25 MG tablet Take 50 mg by mouth daily.      . isosorbide mononitrate (IMDUR) 60 MG 24 hr tablet Take 60 mg by mouth daily.      . metoprolol succinate (TOPROL-XL) 100 MG 24 hr tablet Take 100 mg by mouth daily. Take with or immediately following a meal.      . Mirabegron ER (MYRBETRIQ) 25 MG TB24 Take 25 mg by mouth daily.      . nitroGLYCERIN (NITROLINGUAL) 0.4  MG/SPRAY spray Place 1 spray under the tongue every 5 (five) minutes as needed. For chest pain      . valsartan (DIOVAN) 40 MG tablet Take 40 mg by mouth daily.        Assessment: 56 yo male with chest pain, elevated cardiac markers, for Heparin   Goal of Therapy:  Heparin level 0.3-0.7 units/ml Monitor platelets by anticoagulation protocol: Yes   Plan:  Heparin 4000 units IV bolus, then 1200 units/hr Check heparin level in 6 hours.  Avin Upperman, Gary Fleet 02/04/2012,1:00 AM

## 2012-03-05 DIAGNOSIS — Z9889 Other specified postprocedural states: Secondary | ICD-10-CM

## 2012-03-05 HISTORY — DX: Other specified postprocedural states: Z98.890

## 2012-09-25 ENCOUNTER — Ambulatory Visit (INDEPENDENT_AMBULATORY_CARE_PROVIDER_SITE_OTHER): Payer: 59 | Admitting: General Surgery

## 2012-09-26 ENCOUNTER — Ambulatory Visit (INDEPENDENT_AMBULATORY_CARE_PROVIDER_SITE_OTHER): Payer: 59 | Admitting: General Surgery

## 2012-10-10 ENCOUNTER — Encounter (INDEPENDENT_AMBULATORY_CARE_PROVIDER_SITE_OTHER): Payer: Self-pay | Admitting: General Surgery

## 2012-10-10 ENCOUNTER — Ambulatory Visit (INDEPENDENT_AMBULATORY_CARE_PROVIDER_SITE_OTHER): Payer: 59 | Admitting: General Surgery

## 2012-10-10 VITALS — BP 124/68 | HR 72 | Temp 97.5°F | Resp 15 | Ht 67.0 in | Wt 192.8 lb

## 2012-10-10 DIAGNOSIS — K644 Residual hemorrhoidal skin tags: Secondary | ICD-10-CM

## 2012-10-10 MED ORDER — HYDROCORTISONE ACETATE 25 MG RE SUPP: 25.0000 mg | Freq: Two times a day (BID) | RECTAL | Status: DC

## 2012-10-10 NOTE — Progress Notes (Signed)
Subjective:     Patient ID: [name], male   DOB: Nov 26, 1955, 57 y.o.   MRN: [id number]  HPI The patient is a 57 year old male who approximately one year history of external hemorrhoids. Patient describes having tenderness to pain while sitting as well as one episode of bleeding. The patient has been seen by his PCP and try ProctoFoam which has been somewhat successful with the itching.  Review of Systems  Constitutional: Negative.   Eyes: Negative.   Respiratory: Negative.   Cardiovascular: Negative.   Gastrointestinal: Positive for anal bleeding and rectal pain.  Neurological: Negative.   All other systems reviewed and are negative.       Objective:   Physical Exam  Constitutional: He is oriented to person, place, and time. He appears well-developed and well-nourished.  HENT:  Head: Normocephalic and atraumatic.  Eyes: Conjunctivae and EOM are normal. Pupils are equal, round, and reactive to light.  Neck: Normal range of motion. Neck supple.  Cardiovascular: Normal rate, regular rhythm and normal heart sounds.   Pulmonary/Chest: Effort normal and breath sounds normal.  Abdominal: Soft. Bowel sounds are normal.  Genitourinary:     Musculoskeletal: Normal range of motion.  Neurological: He is alert and oriented to person, place, and time.  Skin: Skin is warm and dry.       Assessment:     57 year old male with external hemorrhoids     Plan:     1. I discussed with the patient the pathophysiology of hemorrhoids. He agrees to proceed at this time with medical therapy to include increase his fiber intake as well as hydration. I believe that with her fiber regimen the patient did have resolution of his hemorrhoids. I will give patient a prescription for Anusol suppositories have been a flare ups. 2. Should the patient not have any success at this time and call back for followup.Marland Kitchen

## 2012-11-07 ENCOUNTER — Telehealth (INDEPENDENT_AMBULATORY_CARE_PROVIDER_SITE_OTHER): Payer: Self-pay | Admitting: General Surgery

## 2012-11-07 ENCOUNTER — Other Ambulatory Visit (INDEPENDENT_AMBULATORY_CARE_PROVIDER_SITE_OTHER): Payer: Self-pay | Admitting: General Surgery

## 2012-11-07 DIAGNOSIS — K644 Residual hemorrhoidal skin tags: Secondary | ICD-10-CM

## 2012-11-07 MED ORDER — HYDROCORTISONE ACETATE 25 MG RE SUPP: 25.0000 mg | Freq: Two times a day (BID) | RECTAL | Status: DC

## 2012-11-07 NOTE — Telephone Encounter (Signed)
Refill Hydrocortisone AC 25mg  supp #12 zero refills faxed to [facility] [phone]/pt notified and pleased

## 2012-11-21 ENCOUNTER — Encounter: Payer: Self-pay | Admitting: *Deleted

## 2012-11-22 ENCOUNTER — Encounter: Payer: Self-pay | Admitting: Interventional Cardiology

## 2012-11-22 ENCOUNTER — Encounter: Payer: Self-pay | Admitting: *Deleted

## 2012-11-22 DIAGNOSIS — E785 Hyperlipidemia, unspecified: Secondary | ICD-10-CM | POA: Insufficient documentation

## 2012-11-22 DIAGNOSIS — I2510 Atherosclerotic heart disease of native coronary artery without angina pectoris: Secondary | ICD-10-CM | POA: Insufficient documentation

## 2012-11-22 DIAGNOSIS — M1990 Unspecified osteoarthritis, unspecified site: Secondary | ICD-10-CM | POA: Insufficient documentation

## 2012-11-22 DIAGNOSIS — K5790 Diverticulosis of intestine, part unspecified, without perforation or abscess without bleeding: Secondary | ICD-10-CM | POA: Insufficient documentation

## 2012-11-22 DIAGNOSIS — N329 Bladder disorder, unspecified: Secondary | ICD-10-CM | POA: Insufficient documentation

## 2012-11-22 DIAGNOSIS — C801 Malignant (primary) neoplasm, unspecified: Secondary | ICD-10-CM | POA: Insufficient documentation

## 2012-12-03 ENCOUNTER — Ambulatory Visit: Payer: 59 | Admitting: Interventional Cardiology

## 2012-12-05 ENCOUNTER — Other Ambulatory Visit: Payer: Self-pay | Admitting: Interventional Cardiology

## 2012-12-11 ENCOUNTER — Telehealth: Payer: Self-pay | Admitting: Internal Medicine

## 2012-12-11 NOTE — Telephone Encounter (Signed)
C/D 12/11/12 for appt. 12/29/12

## 2012-12-23 ENCOUNTER — Ambulatory Visit: Payer: 59 | Admitting: Interventional Cardiology

## 2012-12-26 ENCOUNTER — Telehealth: Payer: Self-pay | Admitting: *Deleted

## 2012-12-26 NOTE — Telephone Encounter (Signed)
Spoke with pt regarding appt on Monday with Dr. Arbutus Ped. He stated he was unable to make appt that day.  I reschedule for 01/06/13 11:00 labs and 11:30 with Dr. Arbutus Ped.  He verbalized understanding.

## 2012-12-29 ENCOUNTER — Ambulatory Visit: Payer: 59 | Admitting: Internal Medicine

## 2012-12-29 ENCOUNTER — Ambulatory Visit: Payer: 59

## 2012-12-29 ENCOUNTER — Other Ambulatory Visit: Payer: 59 | Admitting: Lab

## 2013-01-05 ENCOUNTER — Ambulatory Visit: Payer: 59 | Admitting: Interventional Cardiology

## 2013-01-06 ENCOUNTER — Encounter: Payer: Self-pay | Admitting: Internal Medicine

## 2013-01-06 ENCOUNTER — Ambulatory Visit (HOSPITAL_BASED_OUTPATIENT_CLINIC_OR_DEPARTMENT_OTHER): Payer: 59 | Admitting: Internal Medicine

## 2013-01-06 ENCOUNTER — Encounter (INDEPENDENT_AMBULATORY_CARE_PROVIDER_SITE_OTHER): Payer: Self-pay

## 2013-01-06 ENCOUNTER — Other Ambulatory Visit: Payer: Self-pay | Admitting: Internal Medicine

## 2013-01-06 ENCOUNTER — Telehealth: Payer: Self-pay | Admitting: Internal Medicine

## 2013-01-06 ENCOUNTER — Ambulatory Visit (HOSPITAL_BASED_OUTPATIENT_CLINIC_OR_DEPARTMENT_OTHER): Payer: 59 | Admitting: Lab

## 2013-01-06 VITALS — BP 117/85 | HR 79 | Temp 97.6°F | Resp 18 | Ht 67.0 in | Wt 189.3 lb

## 2013-01-06 DIAGNOSIS — D72829 Elevated white blood cell count, unspecified: Secondary | ICD-10-CM | POA: Insufficient documentation

## 2013-01-06 LAB — COMPREHENSIVE METABOLIC PANEL (CC13)
ALT: 14 U/L (ref 0–55)
Albumin: 3.9 g/dL (ref 3.5–5.0)
Anion Gap: 9 mEq/L (ref 3–11)
CO2: 23 mEq/L (ref 22–29)
Calcium: 9.4 mg/dL (ref 8.4–10.4)
Chloride: 106 mEq/L (ref 98–109)
Glucose: 112 mg/dl (ref 70–140)
Potassium: 4.1 mEq/L (ref 3.5–5.1)
Sodium: 138 mEq/L (ref 136–145)
Total Bilirubin: 0.34 mg/dL (ref 0.20–1.20)
Total Protein: 7.6 g/dL (ref 6.4–8.3)

## 2013-01-06 LAB — CBC WITH DIFFERENTIAL/PLATELET
Basophils Absolute: 1.5 10*3/uL — ABNORMAL HIGH (ref 0.0–0.1)
Eosinophils Absolute: 0.3 10*3/uL (ref 0.0–0.5)
HGB: 12 g/dL — ABNORMAL LOW (ref 13.0–17.1)
MCV: 98 fL (ref 79.3–98.0)
MONO#: 2.9 10*3/uL — ABNORMAL HIGH (ref 0.1–0.9)
MONO%: 18.2 % — ABNORMAL HIGH (ref 0.0–14.0)
NEUT#: 8.1 10*3/uL — ABNORMAL HIGH (ref 1.5–6.5)
RBC: 3.49 10*6/uL — ABNORMAL LOW (ref 4.20–5.82)
RDW: 18.7 % — ABNORMAL HIGH (ref 11.0–14.6)
WBC: 16.2 10*3/uL — ABNORMAL HIGH (ref 4.0–10.3)
nRBC: 0 % (ref 0–0)

## 2013-01-06 LAB — LACTATE DEHYDROGENASE (CC13): LDH: 369 U/L — ABNORMAL HIGH (ref 125–245)

## 2013-01-06 NOTE — Telephone Encounter (Signed)
gv and printed aptp sched and avs for pt for NOV...pt requested 11.24.14 had other appts the week b4

## 2013-01-06 NOTE — Progress Notes (Signed)
[facility] [phone]   [phone]  CONSULT NOTE  REFERRING PHYSICIAN:Dr. Dorothyann Peng  REASON FOR CONSULTATION:  57 years old African American male with leukocytosis  HPI [name] is a 57 y.o. male with past medical history significant for hypertension, dyslipidemia, history of coronary artery disease and myocardial infarction several times started in April 2002, history of prostate cancer status post surgical resection 2006 as well as borderline diabetes mellitus. The patient was seen recently by his primary care physician Dr. Allyne Gee and during his evaluation he had CBC on 12/04/2012 that showed elevated white blood count of 15.7, hemoglobin 13.9, hematocrit 40.3% with platelets count of 173,000. His absolute neutrophil count was also elevated at 8600 and absolute lymphocyte count 3300 was elevated basophils of 1900. The CBC on 09/15/2012 showed white blood count of 10.8, hemoglobin 14.7, hematocrit 42.7%, platelets count to 177,000 with absolute neutrophil count of 5700, absolute lymphocyte count of 2600 and absolute basophils count of 800. Because of the persistent elevation of his white blood count, the patient was referred to me today for further evaluation and recommendation regarding his condition. The patient is feeling fine with no specific complaints today. He has been on treatment with testosterone for the last 4 months he also was steroid suppositories for hemorrhoids treatment. His over-the-counter medication include only fish oil, Omega 3 and multivitamins. He denied having any bleeding issues. He denied having any recent upper respiratory infection or urinary tract infection. He has intentional weight loss of 18-20 pounds over the last 6 months. He denied having any night sweats. The patient has no nausea or vomiting, no change in bowel movement. The patient denied having any significant chest pain, shortness breath, cough or  hemoptysis. Family history significant for a mother diagnosed with liver cancer. No other family history of leukemia/lymphoma or any other blood disease. The patient is sedated and has 3 children. 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 He has a remote history of smoking but quit in 2002 and he drinks alcohol occasionally, no history of drug abuse.   HPI  Past Medical History  Diagnosis Date  . Myocardial infarction 02,6/12  . Hypertension   . Arthritis   . Cancer     hx prostate cancer  . Bladder disorder   . Coronary artery disease     with prior MI 2002(BMS) and Cfx(2006) and RCA (2004) stents . EF 45-50%  . Hyperlipidemia   . Coronary atherosclerosis of native coronary artery     stable and suspect a component of CAS  . Diverticulosis     seen on colonoscopy in 2008    Past Surgical History  Procedure Laterality Date  . Carotid stent    . Tonsillectomy    . Fracture surgery      rt wrist  . Toe surgery      rt great toe  . Wrist surgery      rt and lt cysts removed  . Orif ankle fracture  07/04/2011    Procedure: OPEN REDUCTION INTERNAL FIXATION (ORIF) ANKLE FRACTURE;  Surgeon: Harvie Junior, MD;  Location: [facility];  Service: Orthopedics;  Laterality: Right;  . Cardiac catheterization      normal EF, patent stents without obstructive disease (on Plavix X 1 month only)    Family History  Problem Relation Age of Onset  . Cancer Mother     Liver    Social History History  Substance Use Topics  .  Smoking status: Former Smoker    Types: Cigarettes    Quit date: 04/25/2011  . Smokeless tobacco: Never Used     Comment: 2002  . Alcohol Use: 1.2 oz/week    2 Cans of beer per week     Comment: rare    Allergies  Allergen Reactions  . Contrast Media [Iodinated Diagnostic Agents] Other (See Comments)    Reaction unknown    Current Outpatient Prescriptions  Medication Sig Dispense Refill  . aspirin 81 MG chewable tablet Chew 162 mg by mouth  daily.       Marland Kitchen atorvastatin (LIPITOR) 10 MG tablet Take 10 mg by mouth daily.      . cholecalciferol (VITAMIN D) 1000 UNITS tablet Take 1,000 Units by mouth daily.      Marland Kitchen darifenacin (ENABLEX) 15 MG 24 hr tablet Take 15 mg by mouth daily.       Marland Kitchen dexlansoprazole (DEXILANT) 60 MG capsule Take 60 mg by mouth daily.      Marland Kitchen DIOVAN 40 MG tablet TAKE 1 TABLET BY MOUTH DAILY  30 tablet  2  . hydrOXYzine (ATARAX/VISTARIL) 25 MG tablet Take 50 mg by mouth daily.      . isosorbide mononitrate (IMDUR) 60 MG 24 hr tablet Take 60 mg by mouth daily.      . methocarbamol (ROBAXIN) 750 MG tablet Take 750 mg by mouth as needed for muscle spasms.      . metoprolol succinate (TOPROL-XL) 100 MG 24 hr tablet Take 100 mg by mouth daily. Take with or immediately following a meal.      . Mirabegron ER (MYRBETRIQ) 25 MG TB24 Take 25 mg by mouth daily.      . nitroGLYCERIN (NITROLINGUAL) 0.4 MG/SPRAY spray Place 1 spray under the tongue every 5 (five) minutes as needed for chest pain. For chest pain  12 g  11  . Omega-3 Fatty Acids (FISH OIL TRIPLE STRENGTH) 1400 MG CAPS Take by mouth.      . Testosterone (AXIRON) 30 MG/ACT SOLN Place onto the skin.      Marland Kitchen traMADol (ULTRAM) 50 MG tablet Take by mouth every 6 (six) hours as needed.       No current facility-administered medications for this visit.    Review of Systems  Constitutional: negative Eyes: negative Ears, nose, mouth, throat, and face: negative Respiratory: negative Cardiovascular: negative Gastrointestinal: negative Genitourinary:negative Integument/breast: negative Hematologic/lymphatic: negative Musculoskeletal:negative Neurological: negative Behavioral/Psych: negative Endocrine: negative Allergic/Immunologic: negative  Physical Exam  ZOX:WRUEA, healthy, no distress, well nourished and well developed SKIN: skin color, texture, turgor are normal HEAD: Normocephalic, No masses, lesions, tenderness or abnormalities EYES: normal, PERRLA EARS:  External ears normal OROPHARYNX:no exudate, no erythema and lips, buccal mucosa, and tongue normal  NECK: supple, no adenopathy, no JVD LYMPH:  no palpable lymphadenopathy, no hepatosplenomegaly LUNGS: and palpation, clear to auscultation and percussion HEART: regular rate & rhythm, no murmurs and no gallops ABDOMEN:abdomen soft, non-tender, normal bowel sounds and no masses or organomegaly BACK: Back symmetric, no curvature., No CVA tenderness EXTREMITIES:no joint deformities, effusion, or inflammation, no edema, no skin discoloration  NEURO: alert & oriented x 3 with fluent speech, no focal motor/sensory deficits  PERFORMANCE STATUS: ECOG 0  LABORATORY DATA: Lab Results  Component Value Date   WBC 16.2* 01/06/2013   HGB 12.0* 01/06/2013   HCT 34.2* 01/06/2013   MCV 98.0 01/06/2013   PLT 153 01/06/2013      Chemistry      Component Value Date/Time  NA 140 02/03/2012 1345   K 3.7 02/03/2012 1345   CL 105 02/03/2012 1345   CO2 26 02/03/2012 1345   BUN 8 02/03/2012 1345   CREATININE 1.30 02/03/2012 1345      Component Value Date/Time   CALCIUM 9.1 02/03/2012 1345   ALKPHOS 46 08/26/2010 1756   AST 19 08/26/2010 1756   ALT 23 08/26/2010 1756   BILITOT 0.1* 08/26/2010 1756       RADIOGRAPHIC STUDIES: No results found.  ASSESSMENT: This is a very pleasant 57 years old [place] American male with persistent leukocytosis was elevated absolute neutrophil count, absolute lymphocyte count as well as basophils. This is most likely reactive in nature secondary to his current treatment with testosterone and steroids suppositories, but I cannot rule out any other myeloproliferative disorder at this point.  PLAN: I have a lengthy discussion with the patient today about his current disease status and further evaluation to rule out myeloproliferative disorder.  I ordered several studies today including repeat CBC, comprehensive metabolic panel, LDH as well as molecular study for BCR/ABL. I will see  the patient back for followup visit in 2 weeks for evaluation and discussion of his pending lab results. He was advised to call immediately if he has any concerning symptoms in the interval.  The patient voices understanding of current disease status and treatment options and is in agreement with the current care plan.  All questions were answered. The patient knows to call the clinic with any problems, questions or concerns. We can certainly see the patient much sooner if necessary.  Thank you so much for allowing me to participate in the care of [name]. I will continue to follow up the patient with you and assist in his care.  I spent 40 minutes counseling the patient face to face. The total time spent in the appointment was 55 minutes.  Leonetta Mcgivern K. 01/06/2013, 12:28 PM

## 2013-01-06 NOTE — Patient Instructions (Signed)
Followup visit in 2 weeks for evaluation and discussion of the pending lab results.

## 2013-01-08 ENCOUNTER — Other Ambulatory Visit: Payer: Self-pay

## 2013-01-19 ENCOUNTER — Ambulatory Visit (INDEPENDENT_AMBULATORY_CARE_PROVIDER_SITE_OTHER): Payer: 59 | Admitting: Interventional Cardiology

## 2013-01-19 ENCOUNTER — Encounter: Payer: Self-pay | Admitting: Interventional Cardiology

## 2013-01-19 VITALS — BP 114/69 | HR 73 | Ht 67.0 in | Wt 193.0 lb

## 2013-01-19 DIAGNOSIS — I10 Essential (primary) hypertension: Secondary | ICD-10-CM

## 2013-01-19 DIAGNOSIS — E785 Hyperlipidemia, unspecified: Secondary | ICD-10-CM

## 2013-01-19 DIAGNOSIS — I2510 Atherosclerotic heart disease of native coronary artery without angina pectoris: Secondary | ICD-10-CM

## 2013-01-19 NOTE — Patient Instructions (Signed)
Your physician recommends that you continue on your current medications as directed. Please refer to the Current Medication list given to you today.  Your physician wants you to follow-up in: 1 year You will receive a reminder letter in the mail two months in advance. If you don't receive a letter, please call our office to schedule the follow-up appointment.  Try to maintain a healthy lifestyle with diet and exercise

## 2013-01-19 NOTE — Progress Notes (Signed)
Patient ID: [name], male   DOB: 17-Aug-1955, 57 y.o.   MRN: [id number]    [address] [place], [place]  [address] Phone: [phone] Fax:  [phone]  Date:  01/19/2013   ID:  [name], DOB 03-31-1955, MRN [id number]  PCP:  Gwynneth Aliment, MD   ASSESSMENT:  1. CAD, stable without angina 2. Hypertension under control 3. Hyperlipidemia  PLAN:  1. aerobic activity/active lifestyle as advocated 2. Clinical followup in one year 3. No change in medical regimen   SUBJECTIVE: [name] is a 57 y.o. male who is still having difficulty with a 72 year old son. They have frequent conflicts at cause emotional and physical stress. Luckily, and no prolonged episodes of angina have occurred. He is exercising but not as much as he would like. Nitroglycerin has not been needed. He is now having some difficulty with elevated white blood cell counts and is being seen by Dr. Arlis Porta at the cancer Center. He denies edema.   Wt Readings from Last 3 Encounters:  01/19/13 193 lb (87.544 kg)  01/06/13 189 lb 4.8 oz (85.866 kg)  10/10/12 192 lb 12.8 oz (87.454 kg)     Past Medical History  Diagnosis Date  . Myocardial infarction 02,6/12  . Hypertension   . Arthritis   . Cancer     hx prostate cancer  . Bladder disorder   . Coronary artery disease     with prior MI 2002(BMS) and Cfx(2006) and RCA (2004) stents . EF 45-50%  . Hyperlipidemia   . Coronary atherosclerosis of native coronary artery     stable and suspect a component of CAS  . Diverticulosis     seen on colonoscopy in 2008    Current Outpatient Prescriptions  Medication Sig Dispense Refill  . aspirin 81 MG chewable tablet Chew 162 mg by mouth daily.       Marland Kitchen atorvastatin (LIPITOR) 10 MG tablet Take 10 mg by mouth daily.      . cholecalciferol (VITAMIN D) 1000 UNITS tablet Take 5,000 Units by mouth daily.       Marland Kitchen darifenacin (ENABLEX) 15 MG 24 hr tablet Take 15 mg by mouth  daily.       Marland Kitchen dexlansoprazole (DEXILANT) 60 MG capsule Take 60 mg by mouth daily.      Marland Kitchen DIOVAN 40 MG tablet TAKE 1 TABLET BY MOUTH DAILY  30 tablet  2  . hydrOXYzine (ATARAX/VISTARIL) 25 MG tablet Take 50 mg by mouth daily.      . isosorbide mononitrate (IMDUR) 60 MG 24 hr tablet Take 60 mg by mouth daily.      . methocarbamol (ROBAXIN) 750 MG tablet Take 750 mg by mouth as needed for muscle spasms.      . metoprolol succinate (TOPROL-XL) 100 MG 24 hr tablet Take 100 mg by mouth daily. Take with or immediately following a meal.      . Mirabegron ER (MYRBETRIQ) 25 MG TB24 Take 25 mg by mouth daily.      . nitroGLYCERIN (NITROLINGUAL) 0.4 MG/SPRAY spray Place 1 spray under the tongue every 5 (five) minutes as needed for chest pain. For chest pain  12 g  11  . Omega-3 Fatty Acids (FISH OIL TRIPLE STRENGTH) 1400 MG CAPS Take by mouth.      . traMADol (ULTRAM) 50 MG tablet Take by mouth every 6 (six) hours as needed.       No current facility-administered medications for this  visit.    Allergies:    Allergies  Allergen Reactions  . Contrast Media [Iodinated Diagnostic Agents] Other (See Comments)    Reaction unknown    Social History:  The patient  reports that he quit smoking about 20 months ago. His smoking use included Cigarettes. He smoked 0.00 packs per day. He has never used smokeless tobacco. He reports that he drinks about 1.2 ounces of alcohol per week. He reports that he does not use illicit drugs.   ROS:  Please see the history of present illness.      All other systems reviewed and negative.   OBJECTIVE: VS:  BP 114/69  Pulse 73  Ht 5\' 7"  (1.702 m)  Wt 193 lb (87.544 kg)  BMI 30.22 kg/m2 Well nourished, well developed, in no acute distress, obese HEENT: normal Neck: JVD flat. Carotid bruit absent  Cardiac:  normal S1, S2; RRR; no murmur Lungs:  clear to auscultation bilaterally, no wheezing, rhonchi or rales Abd: soft, nontender, no hepatomegaly Ext: Edema absent.  Pulses 2+ Skin: warm and dry Neuro:  CNs 2-12 intact, no focal abnormalities noted  EKG:  None performed       Signed, Darci Needle III, MD 01/19/2013 4:19 PM

## 2013-01-26 ENCOUNTER — Ambulatory Visit (HOSPITAL_BASED_OUTPATIENT_CLINIC_OR_DEPARTMENT_OTHER): Payer: 59 | Admitting: Internal Medicine

## 2013-01-26 ENCOUNTER — Other Ambulatory Visit (HOSPITAL_BASED_OUTPATIENT_CLINIC_OR_DEPARTMENT_OTHER): Payer: 59 | Admitting: Lab

## 2013-01-26 ENCOUNTER — Encounter: Payer: Self-pay | Admitting: Internal Medicine

## 2013-01-26 ENCOUNTER — Telehealth: Payer: Self-pay | Admitting: Internal Medicine

## 2013-01-26 VITALS — BP 108/76 | HR 109 | Temp 98.2°F | Resp 18 | Ht 67.0 in | Wt 188.8 lb

## 2013-01-26 DIAGNOSIS — D72829 Elevated white blood cell count, unspecified: Secondary | ICD-10-CM

## 2013-01-26 DIAGNOSIS — C9210 Chronic myeloid leukemia, BCR/ABL-positive, not having achieved remission: Secondary | ICD-10-CM

## 2013-01-26 LAB — CBC WITH DIFFERENTIAL/PLATELET
BASO%: 10.2 % — ABNORMAL HIGH (ref 0.0–2.0)
Basophils Absolute: 1.9 10*3/uL — ABNORMAL HIGH (ref 0.0–0.1)
EOS%: 2.2 % (ref 0.0–7.0)
MCH: 34.7 pg — ABNORMAL HIGH (ref 27.2–33.4)
MCHC: 33.6 g/dL (ref 32.0–36.0)
MCV: 103.1 fL — ABNORMAL HIGH (ref 79.3–98.0)
MONO%: 17.8 % — ABNORMAL HIGH (ref 0.0–14.0)
RDW: 19.8 % — ABNORMAL HIGH (ref 11.0–14.6)
lymph#: 3.7 10*3/uL — ABNORMAL HIGH (ref 0.9–3.3)

## 2013-01-26 LAB — TECHNOLOGIST REVIEW: Technologist Review: 2

## 2013-01-26 MED ORDER — IMATINIB MESYLATE 400 MG PO TABS: 400.0000 mg | ORAL_TABLET | Freq: Every day | ORAL | Status: DC

## 2013-01-26 NOTE — Progress Notes (Signed)
[facility] [phone]   [phone]  OFFICE PROGRESS NOTE  Gwynneth Aliment, MD [address] [place] [address]  DIAGNOSIS:  1) Chronic myeloid leukemia diagnosed in November of 2014. 2) history of prostate adenocarcinoma diagnosed in 2006  PRIOR THERAPY: Status post prostatectomy.  CURRENT THERAPY: Gleevec 400 mg by mouth daily. Expected to start in the next few days.  INTERVAL HISTORY: [name] 57 y.o. male returns to the clinic today for followup visit. The patient was seen 2 weeks ago for evaluation of persistent leukocytosis and I ordered several studies for evaluation of his condition including molecular study for BCR/ABL that showed detectable abnormalities consistent with chronic myeloid leukemia. The patient is feeling fine today with no specific complaints. He denied having any significant chest pain, shortness breath, cough or hemoptysis. He has no nausea, vomiting or change in bowel movement. The patient denied having any fever or chills. He lost a few pounds recently but no significant night sweats. He is here today for evaluation and discussion of his lab results and recommendation regarding treatment of his condition.  MEDICAL HISTORY: Past Medical History  Diagnosis Date  . Myocardial infarction 02,6/12  . Hypertension   . Arthritis   . Cancer     hx prostate cancer  . Bladder disorder   . Coronary artery disease     with prior MI 2002(BMS) and Cfx(2006) and RCA (2004) stents . EF 45-50%  . Hyperlipidemia   . Coronary atherosclerosis of native coronary artery     stable and suspect a component of CAS  . Diverticulosis     seen on colonoscopy in 2008    ALLERGIES:  is allergic to contrast media.  MEDICATIONS:  Current Outpatient Prescriptions  Medication Sig Dispense Refill  . aspirin 81 MG chewable tablet Chew 162 mg by mouth daily.       Marland Kitchen atorvastatin (LIPITOR) 10 MG tablet Take 10 mg by  mouth daily.      . cholecalciferol (VITAMIN D) 1000 UNITS tablet Take 5,000 Units by mouth daily.       Marland Kitchen darifenacin (ENABLEX) 15 MG 24 hr tablet Take 15 mg by mouth daily.       Marland Kitchen dexlansoprazole (DEXILANT) 60 MG capsule Take 60 mg by mouth daily.      Marland Kitchen DIOVAN 40 MG tablet TAKE 1 TABLET BY MOUTH DAILY  30 tablet  2  . hydrOXYzine (ATARAX/VISTARIL) 25 MG tablet Take 50 mg by mouth daily.      . isosorbide mononitrate (IMDUR) 60 MG 24 hr tablet Take 60 mg by mouth daily.      . metoprolol succinate (TOPROL-XL) 100 MG 24 hr tablet Take 100 mg by mouth daily. Take with or immediately following a meal.      . Mirabegron ER (MYRBETRIQ) 25 MG TB24 Take 25 mg by mouth daily.      . nitroGLYCERIN (NITROLINGUAL) 0.4 MG/SPRAY spray Place 1 spray under the tongue every 5 (five) minutes as needed for chest pain. For chest pain  12 g  11  . Omega-3 Fatty Acids (FISH OIL TRIPLE STRENGTH) 1400 MG CAPS Take by mouth.      . methocarbamol (ROBAXIN) 750 MG tablet Take 750 mg by mouth as needed for muscle spasms.       No current facility-administered medications for this visit.    SURGICAL HISTORY:  Past Surgical History  Procedure Laterality Date  . Carotid stent    .  Tonsillectomy    . Fracture surgery      rt wrist  . Toe surgery      rt great toe  . Wrist surgery      rt and lt cysts removed  . Orif ankle fracture  07/04/2011    Procedure: OPEN REDUCTION INTERNAL FIXATION (ORIF) ANKLE FRACTURE;  Surgeon: Harvie Junior, MD;  Location: [facility];  Service: Orthopedics;  Laterality: Right;  . Cardiac catheterization      normal EF, patent stents without obstructive disease (on Plavix X 1 month only)    REVIEW OF SYSTEMS:  Constitutional: negative Eyes: negative Ears, nose, mouth, throat, and face: negative Respiratory: negative Cardiovascular: negative Gastrointestinal: negative Genitourinary:negative Integument/breast: negative Hematologic/lymphatic:  negative Musculoskeletal:negative Neurological: negative Behavioral/Psych: negative Endocrine: negative Allergic/Immunologic: negative   PHYSICAL EXAMINATION: General appearance: alert, cooperative and no distress Head: Normocephalic, without obvious abnormality, atraumatic Neck: no adenopathy, no JVD, supple, symmetrical, trachea midline and thyroid not enlarged, symmetric, no tenderness/mass/nodules Lymph nodes: Cervical, supraclavicular, and axillary nodes normal. Resp: clear to auscultation bilaterally Back: symmetric, no curvature. ROM normal. No CVA tenderness. Cardio: regular rate and rhythm, S1, S2 normal, no murmur, click, rub or gallop GI: soft, non-tender; bowel sounds normal; no masses,  no organomegaly Extremities: extremities normal, atraumatic, no cyanosis or edema Neurologic: Alert and oriented X 3, normal strength and tone. Normal symmetric reflexes. Normal coordination and gait  ECOG PERFORMANCE STATUS: 0 - Asymptomatic  Blood pressure 108/76, pulse 109, temperature 98.2 F (36.8 C), temperature source Oral, resp. rate 18, height 5\' 7"  (1.702 m), weight 188 lb 12.8 oz (85.639 kg).  LABORATORY DATA: Lab Results  Component Value Date   WBC 18.9* 01/26/2013   HGB 11.4* 01/26/2013   HCT 33.8* 01/26/2013   MCV 103.1* 01/26/2013   PLT 142 01/26/2013      Chemistry      Component Value Date/Time   NA 138 01/06/2013 1150   NA 140 02/03/2012 1345   K 4.1 01/06/2013 1150   K 3.7 02/03/2012 1345   CL 105 02/03/2012 1345   CO2 23 01/06/2013 1150   CO2 26 02/03/2012 1345   BUN 16.3 01/06/2013 1150   BUN 8 02/03/2012 1345   CREATININE 1.2 01/06/2013 1150   CREATININE 1.30 02/03/2012 1345      Component Value Date/Time   CALCIUM 9.4 01/06/2013 1150   CALCIUM 9.1 02/03/2012 1345   ALKPHOS 72 01/06/2013 1150   ALKPHOS 46 08/26/2010 1756   AST 24 01/06/2013 1150   AST 19 08/26/2010 1756   ALT 14 01/06/2013 1150   ALT 23 08/26/2010 1756   BILITOT 0.34 01/06/2013 1150   BILITOT  0.1* 08/26/2010 1756       RADIOGRAPHIC STUDIES: No results found.  ASSESSMENT AND PLAN: This is a very pleasant 57 years old [place] American male recently diagnosed with chronic myeloid leukemia. I have a lengthy discussion with the patient today about his current diagnosis, prognosis and treatment options. I recommended for the patient treatment with Gleevec 400 mg by mouth daily. I discussed with the patient adverse effect of this treatment including but not limited to myelosuppression, nausea and vomiting, fatigue, liver dysfunction and rare incidence of congestive heart failure. The patient would like to proceed with treatment as planned. I would see him back for followup visit in 2 weeks for reevaluation with repeat blood work. He was advised to call immediately if he has any concerning symptoms in the interval.  The patient voices understanding of  current disease status and treatment options and is in agreement with the current care plan.  All questions were answered. The patient knows to call the clinic with any problems, questions or concerns. We can certainly see the patient much sooner if necessary.  I spent 15 minutes counseling the patient face to face. The total time spent in the appointment was 25 minutes.

## 2013-01-26 NOTE — Patient Instructions (Signed)
You are recently diagnosed with chronic myeloid leukemia. We discussed treatment with Gleevec. Followup visit in 2 weeks.

## 2013-01-26 NOTE — Telephone Encounter (Signed)
gv and printed appt scheda nd avs for pt for NOV adn DEC °

## 2013-01-27 ENCOUNTER — Encounter: Payer: Self-pay | Admitting: *Deleted

## 2013-01-27 ENCOUNTER — Other Ambulatory Visit: Payer: 59

## 2013-01-28 ENCOUNTER — Other Ambulatory Visit: Payer: Self-pay | Admitting: *Deleted

## 2013-01-28 DIAGNOSIS — C9210 Chronic myeloid leukemia, BCR/ABL-positive, not having achieved remission: Secondary | ICD-10-CM

## 2013-01-28 MED ORDER — IMATINIB MESYLATE 400 MG PO TABS: 400.0000 mg | ORAL_TABLET | Freq: Every day | ORAL | Status: DC

## 2013-02-02 ENCOUNTER — Telehealth: Payer: Self-pay | Admitting: Medical Oncology

## 2013-02-02 NOTE — Telephone Encounter (Signed)
Gleevec rx called to [facility].

## 2013-02-03 ENCOUNTER — Telehealth: Payer: Self-pay | Admitting: Medical Oncology

## 2013-02-03 NOTE — Telephone Encounter (Signed)
Gleevec is schedule to arrive at his house on 12/6 so I sent Onc Tx request to move lab and appt by 2 weeks .

## 2013-02-04 ENCOUNTER — Telehealth: Payer: Self-pay | Admitting: Internal Medicine

## 2013-02-04 NOTE — Telephone Encounter (Signed)
s.w. pt and advised on DEc appt movedto 12.22 per MD...pt ok and aware

## 2013-02-09 ENCOUNTER — Ambulatory Visit: Payer: 59 | Admitting: Internal Medicine

## 2013-02-09 ENCOUNTER — Other Ambulatory Visit: Payer: 59 | Admitting: Lab

## 2013-02-12 ENCOUNTER — Encounter: Payer: Self-pay | Admitting: *Deleted

## 2013-02-12 ENCOUNTER — Telehealth: Payer: Self-pay | Admitting: *Deleted

## 2013-02-12 NOTE — Telephone Encounter (Signed)
Packet at front desk with wilma.

## 2013-02-23 ENCOUNTER — Other Ambulatory Visit (HOSPITAL_BASED_OUTPATIENT_CLINIC_OR_DEPARTMENT_OTHER): Payer: 59

## 2013-02-23 ENCOUNTER — Encounter: Payer: Self-pay | Admitting: Internal Medicine

## 2013-02-23 ENCOUNTER — Ambulatory Visit (HOSPITAL_BASED_OUTPATIENT_CLINIC_OR_DEPARTMENT_OTHER): Payer: 59 | Admitting: Internal Medicine

## 2013-02-23 ENCOUNTER — Telehealth: Payer: Self-pay | Admitting: Internal Medicine

## 2013-02-23 VITALS — BP 102/64 | HR 83 | Temp 98.4°F | Resp 18 | Ht 67.0 in | Wt 190.8 lb

## 2013-02-23 DIAGNOSIS — C9210 Chronic myeloid leukemia, BCR/ABL-positive, not having achieved remission: Secondary | ICD-10-CM

## 2013-02-23 DIAGNOSIS — R0602 Shortness of breath: Secondary | ICD-10-CM

## 2013-02-23 LAB — CBC WITH DIFFERENTIAL/PLATELET
Eosinophils Absolute: 0.2 10*3/uL (ref 0.0–0.5)
MCH: 35.1 pg — ABNORMAL HIGH (ref 27.2–33.4)
MONO#: 0.6 10*3/uL (ref 0.1–0.9)
NEUT#: 1.9 10*3/uL (ref 1.5–6.5)
NEUT%: 32 % — ABNORMAL LOW (ref 39.0–75.0)
Platelets: 128 10*3/uL — ABNORMAL LOW (ref 140–400)
RBC: 2.91 10*6/uL — ABNORMAL LOW (ref 4.20–5.82)
RDW: 18.9 % — ABNORMAL HIGH (ref 11.0–14.6)
WBC: 5.8 10*3/uL (ref 4.0–10.3)
lymph#: 2.2 10*3/uL (ref 0.9–3.3)
nRBC: 0 % (ref 0–0)

## 2013-02-23 LAB — COMPREHENSIVE METABOLIC PANEL (CC13)
ALT: 13 U/L (ref 0–55)
Albumin: 3.7 g/dL (ref 3.5–5.0)
Alkaline Phosphatase: 69 U/L (ref 40–150)
Anion Gap: 7 mEq/L (ref 3–11)
BUN: 14.3 mg/dL (ref 7.0–26.0)
CO2: 24 mEq/L (ref 22–29)
Calcium: 8.8 mg/dL (ref 8.4–10.4)
Chloride: 107 mEq/L (ref 98–109)
Creatinine: 1.2 mg/dL (ref 0.7–1.3)
Glucose: 127 mg/dl (ref 70–140)
Potassium: 4 mEq/L (ref 3.5–5.1)
Sodium: 138 mEq/L (ref 136–145)
Total Bilirubin: 0.45 mg/dL (ref 0.20–1.20)
Total Protein: 6.9 g/dL (ref 6.4–8.3)

## 2013-02-23 LAB — LACTATE DEHYDROGENASE (CC13): LDH: 202 U/L (ref 125–245)

## 2013-02-23 NOTE — Progress Notes (Signed)
[facility] Telephone:(336) [phone]   Fax:(336) [phone]  OFFICE PROGRESS NOTE  Gwynneth Aliment, MD [address] [place] [address]  DIAGNOSIS:  1) Chronic myeloid leukemia diagnosed in November of 2014. 2) history of prostate adenocarcinoma diagnosed in 2006  PRIOR THERAPY: Status post prostatectomy.  CURRENT THERAPY: Gleevec 400 mg by mouth daily, started 02/10/2013.  INTERVAL HISTORY: [name] 57 y.o. male returns to the clinic today for followup visit. The patient is feeling fine today with no specific complaints. He was started on treatment with Gleevec on 02/10/2013 and tolerating it fairly well. He has been complaining of increasing shortness breath especially with exertion but this has been going on for the last few months before starting treatment with Gleevec. He denied having any significant chest pain, cough or hemoptysis. He has no nausea, vomiting or change in bowel movement. The patient denied having any fever or chills. He lost a few pounds recently but no significant night sweats. He is here today for evaluation and discussion of his lab results and recommendation regarding treatment of his condition.   MEDICAL HISTORY: Past Medical History  Diagnosis Date  . Myocardial infarction 02,6/12  . Hypertension   . Arthritis   . Cancer     hx prostate cancer  . Bladder disorder   . Coronary artery disease     with prior MI 2002(BMS) and Cfx(2006) and RCA (2004) stents . EF 45-50%  . Hyperlipidemia   . Coronary atherosclerosis of native coronary artery     stable and suspect a component of CAS  . Diverticulosis     seen on colonoscopy in 2008    ALLERGIES:  is allergic to contrast media.  MEDICATIONS:  Current Outpatient Prescriptions  Medication Sig Dispense Refill  . Ascorbic Acid (VITAMIN C) 1000 MG tablet Take 1,000 mg by mouth daily.      Marland Kitchen aspirin 81 MG chewable tablet Chew 162 mg by mouth daily.       Marland Kitchen  atorvastatin (LIPITOR) 10 MG tablet Take 10 mg by mouth daily.      . cholecalciferol (VITAMIN D) 1000 UNITS tablet Take 5,000 Units by mouth daily.       Marland Kitchen darifenacin (ENABLEX) 15 MG 24 hr tablet Take 15 mg by mouth daily.       Marland Kitchen dexlansoprazole (DEXILANT) 60 MG capsule Take 60 mg by mouth daily.      Marland Kitchen DIOVAN 40 MG tablet TAKE 1 TABLET BY MOUTH DAILY  30 tablet  2  . doxycycline (DORYX) 100 MG EC tablet Take 100 mg by mouth daily.      . hydrOXYzine (ATARAX/VISTARIL) 25 MG tablet Take 50 mg by mouth daily.      Marland Kitchen imatinib (GLEEVEC) 400 MG tablet Take 1 tablet (400 mg total) by mouth daily. Take with meals and large glass of water.Caution:Chemotherapy.  30 tablet  2  . isosorbide mononitrate (IMDUR) 60 MG 24 hr tablet Take 60 mg by mouth daily.      . methocarbamol (ROBAXIN) 750 MG tablet Take 750 mg by mouth as needed for muscle spasms.      . metoprolol succinate (TOPROL-XL) 100 MG 24 hr tablet Take 100 mg by mouth daily. Take with or immediately following a meal.      . Mirabegron ER (MYRBETRIQ) 25 MG TB24 Take 25 mg by mouth daily.      . nitroGLYCERIN (NITROLINGUAL) 0.4 MG/SPRAY spray Place 1 spray under  the tongue every 5 (five) minutes as needed for chest pain. For chest pain  12 g  11  . Nitroglycerin 0.4 % OINT Place 0.125 % rectally 3 (three) times daily.      . Omega-3 Fatty Acids (FISH OIL TRIPLE STRENGTH) 1400 MG CAPS Take by mouth.       No current facility-administered medications for this visit.    SURGICAL HISTORY:  Past Surgical History  Procedure Laterality Date  . Carotid stent    . Tonsillectomy    . Fracture surgery      rt wrist  . Toe surgery      rt great toe  . Wrist surgery      rt and lt cysts removed  . Orif ankle fracture  07/04/2011    Procedure: OPEN REDUCTION INTERNAL FIXATION (ORIF) ANKLE FRACTURE;  Surgeon: Harvie Junior, MD;  Location: [facility];  Service: Orthopedics;  Laterality: Right;  . Cardiac catheterization      normal EF,  patent stents without obstructive disease (on Plavix X 1 month only)    REVIEW OF SYSTEMS:  Constitutional: negative Eyes: negative Ears, nose, mouth, throat, and face: negative Respiratory: negative Cardiovascular: negative Gastrointestinal: negative Genitourinary:negative Integument/breast: negative Hematologic/lymphatic: negative Musculoskeletal:negative Neurological: negative Behavioral/Psych: negative Endocrine: negative Allergic/Immunologic: negative   PHYSICAL EXAMINATION: General appearance: alert, cooperative and no distress Head: Normocephalic, without obvious abnormality, atraumatic Neck: no adenopathy, no JVD, supple, symmetrical, trachea midline and thyroid not enlarged, symmetric, no tenderness/mass/nodules Lymph nodes: Cervical, supraclavicular, and axillary nodes normal. Resp: clear to auscultation bilaterally Back: symmetric, no curvature. ROM normal. No CVA tenderness. Cardio: regular rate and rhythm, S1, S2 normal, no murmur, click, rub or gallop GI: soft, non-tender; bowel sounds normal; no masses,  no organomegaly Extremities: extremities normal, atraumatic, no cyanosis or edema Neurologic: Alert and oriented X 3, normal strength and tone. Normal symmetric reflexes. Normal coordination and gait  ECOG PERFORMANCE STATUS: 0 - Asymptomatic  Blood pressure 102/64, pulse 83, temperature 98.4 F (36.9 C), temperature source Oral, resp. rate 18, height 5\' 7"  (1.702 m), weight 190 lb 12.8 oz (86.546 kg), SpO2 100.00%.  LABORATORY DATA: Lab Results  Component Value Date   WBC 5.8 02/23/2013   HGB 10.2* 02/23/2013   HCT 29.5* 02/23/2013   MCV 101.4* 02/23/2013   PLT 128* 02/23/2013      Chemistry      Component Value Date/Time   NA 138 01/06/2013 1150   NA 140 02/03/2012 1345   K 4.1 01/06/2013 1150   K 3.7 02/03/2012 1345   CL 105 02/03/2012 1345   CO2 23 01/06/2013 1150   CO2 26 02/03/2012 1345   BUN 16.3 01/06/2013 1150   BUN 8 02/03/2012 1345   CREATININE  1.2 01/06/2013 1150   CREATININE 1.30 02/03/2012 1345      Component Value Date/Time   CALCIUM 9.4 01/06/2013 1150   CALCIUM 9.1 02/03/2012 1345   ALKPHOS 72 01/06/2013 1150   ALKPHOS 46 08/26/2010 1756   AST 24 01/06/2013 1150   AST 19 08/26/2010 1756   ALT 14 01/06/2013 1150   ALT 23 08/26/2010 1756   BILITOT 0.34 01/06/2013 1150   BILITOT 0.1* 08/26/2010 1756       RADIOGRAPHIC STUDIES: No results found.  ASSESSMENT AND PLAN: This is a very pleasant 57 years old [place] American male recently diagnosed with chronic myeloid leukemia. He is currently on treatment with Gleevec 400 mg by mouth daily and tolerating it fairly well. He a  significant improvement in the total white blood count. He continues to have shortness of breath especially with exertion but no associated chest pain, diaphoresis or nausea or vomiting. I would monitor this closely and consider referring the patient to cardiology for evaluation if persistent. The patient would like to proceed with treatment as planned. I would see him back for followup visit in 4 weeks for reevaluation with repeat blood work. He was advised to call immediately if he has any concerning symptoms in the interval.  The patient voices understanding of current disease status and treatment options and is in agreement with the current care plan.  All questions were answered. The patient knows to call the clinic with any problems, questions or concerns. We can certainly see the patient much sooner if necessary.

## 2013-02-23 NOTE — Patient Instructions (Signed)
Follow up visit in 4 weeks.  

## 2013-02-23 NOTE — Telephone Encounter (Signed)
PT CAME IN REQUESTING A COPY OF HIS MEDICAL RECORDS.  ROI SIGNED

## 2013-02-23 NOTE — Telephone Encounter (Signed)
gv adn printed appts sched and avs for pt for Jan 2015

## 2013-02-24 ENCOUNTER — Encounter: Payer: Self-pay | Admitting: Internal Medicine

## 2013-02-24 NOTE — Progress Notes (Signed)
Put disability form on nurse's desk. °

## 2013-03-02 ENCOUNTER — Encounter: Payer: Self-pay | Admitting: Internal Medicine

## 2013-03-02 NOTE — Progress Notes (Signed)
Mailed disability form to patient's home.

## 2013-03-20 ENCOUNTER — Telehealth: Payer: Self-pay | Admitting: Internal Medicine

## 2013-03-20 NOTE — Telephone Encounter (Signed)
pt called and r/s 1/19 appt to 1/20. pt has new d/t for 1/20 @ 8:30am lb/MM.

## 2013-03-23 ENCOUNTER — Ambulatory Visit: Payer: 59 | Admitting: Internal Medicine

## 2013-03-23 ENCOUNTER — Other Ambulatory Visit: Payer: 59

## 2013-03-24 ENCOUNTER — Ambulatory Visit (HOSPITAL_BASED_OUTPATIENT_CLINIC_OR_DEPARTMENT_OTHER): Payer: 59 | Admitting: Internal Medicine

## 2013-03-24 ENCOUNTER — Encounter: Payer: Self-pay | Admitting: Internal Medicine

## 2013-03-24 ENCOUNTER — Telehealth: Payer: Self-pay | Admitting: Internal Medicine

## 2013-03-24 ENCOUNTER — Other Ambulatory Visit (HOSPITAL_BASED_OUTPATIENT_CLINIC_OR_DEPARTMENT_OTHER): Payer: 59

## 2013-03-24 VITALS — BP 105/67 | HR 72 | Temp 97.3°F | Resp 18 | Ht 67.0 in | Wt 190.8 lb

## 2013-03-24 DIAGNOSIS — C9210 Chronic myeloid leukemia, BCR/ABL-positive, not having achieved remission: Secondary | ICD-10-CM

## 2013-03-24 DIAGNOSIS — Z8546 Personal history of malignant neoplasm of prostate: Secondary | ICD-10-CM

## 2013-03-24 LAB — COMPREHENSIVE METABOLIC PANEL (CC13)
ALT: 21 U/L (ref 0–55)
ANION GAP: 7 meq/L (ref 3–11)
AST: 23 U/L (ref 5–34)
Albumin: 3.7 g/dL (ref 3.5–5.0)
Alkaline Phosphatase: 63 U/L (ref 40–150)
BILIRUBIN TOTAL: 0.29 mg/dL (ref 0.20–1.20)
BUN: 13.3 mg/dL (ref 7.0–26.0)
CALCIUM: 8.4 mg/dL (ref 8.4–10.4)
CHLORIDE: 109 meq/L (ref 98–109)
CO2: 24 meq/L (ref 22–29)
CREATININE: 1.1 mg/dL (ref 0.7–1.3)
GLUCOSE: 97 mg/dL (ref 70–140)
Potassium: 4 mEq/L (ref 3.5–5.1)
Sodium: 140 mEq/L (ref 136–145)
Total Protein: 6.5 g/dL (ref 6.4–8.3)

## 2013-03-24 LAB — CBC WITH DIFFERENTIAL/PLATELET
BASO%: 4 % — ABNORMAL HIGH (ref 0.0–2.0)
BASOS ABS: 0.1 10*3/uL (ref 0.0–0.1)
EOS%: 2.3 % (ref 0.0–7.0)
Eosinophils Absolute: 0.1 10*3/uL (ref 0.0–0.5)
HEMATOCRIT: 28.3 % — AB (ref 38.4–49.9)
HGB: 9.5 g/dL — ABNORMAL LOW (ref 13.0–17.1)
LYMPH#: 1.1 10*3/uL (ref 0.9–3.3)
LYMPH%: 31.2 % (ref 14.0–49.0)
MCH: 37.9 pg — ABNORMAL HIGH (ref 27.2–33.4)
MCHC: 33.7 g/dL (ref 32.0–36.0)
MCV: 112.7 fL — ABNORMAL HIGH (ref 79.3–98.0)
MONO#: 0.6 10*3/uL (ref 0.1–0.9)
MONO%: 15 % — ABNORMAL HIGH (ref 0.0–14.0)
NEUT#: 1.7 10*3/uL (ref 1.5–6.5)
NEUT%: 47.5 % (ref 39.0–75.0)
PLATELETS: 74 10*3/uL — AB (ref 140–400)
RBC: 2.52 10*6/uL — ABNORMAL LOW (ref 4.20–5.82)
RDW: 18.3 % — ABNORMAL HIGH (ref 11.0–14.6)
WBC: 3.7 10*3/uL — AB (ref 4.0–10.3)

## 2013-03-24 LAB — URIC ACID (CC13): URIC ACID, SERUM: 5 mg/dL (ref 2.6–7.4)

## 2013-03-24 LAB — LACTATE DEHYDROGENASE (CC13): LDH: 184 U/L (ref 125–245)

## 2013-03-24 NOTE — Progress Notes (Signed)
[place] Telephone:([phone]   [phone]  OFFICE PROGRESS NOTE  [name], [address] [place] [address]  DIAGNOSIS:  1) Chronic myeloid leukemia diagnosed in November of 2014. 2) history of prostate adenocarcinoma diagnosed in 2006  PRIOR THERAPY: Status post prostatectomy.  CURRENT THERAPY: Gleevec 400 mg by mouth daily, started 02/10/2013.  INTERVAL HISTORY: [name] 58 y.o. male returns to the clinic today for followup visit. The patient is feeling fine today with no specific complaints. He was started on treatment with Gleevec on 02/10/2013 and tolerating it fairly well so far except for mild fatigue and leg cramps. He has been complaining of increasing shortness of breath especially with exertion but this has been going on for the last few months before starting treatment with Gleevec. He denied having any significant chest pain, cough or hemoptysis. He has no nausea, vomiting or change in bowel movement. The patient denied having any fever or chills. He lost a few pounds recently but no significant night sweats. He is here today for evaluation and discussion of his lab results and recommendation regarding treatment of his condition.   MEDICAL HISTORY: Past Medical History  Diagnosis Date  . Myocardial infarction 02,6/12  . Hypertension   . Arthritis   . Cancer     hx prostate cancer  . Bladder disorder   . Coronary artery disease     with prior MI 2002(BMS) and Cfx(2006) and RCA (2004) stents . EF 45-50%  . Hyperlipidemia   . Coronary atherosclerosis of native coronary artery     stable and suspect a component of CAS  . Diverticulosis     seen on colonoscopy in 2008  . H/O colonoscopy 2014    ALLERGIES:  is allergic to contrast media.  MEDICATIONS:  Current Outpatient Prescriptions  Medication Sig Dispense Refill  . Ascorbic Acid (VITAMIN C) 1000 MG tablet Take 1,000 mg by mouth daily.      Marland Kitchen  aspirin 81 MG chewable tablet Chew 162 mg by mouth daily.       Marland Kitchen atorvastatin (LIPITOR) 10 MG tablet Take 10 mg by mouth daily.      . cholecalciferol (VITAMIN D) 1000 UNITS tablet Take 5,000 Units by mouth daily.       Marland Kitchen darifenacin (ENABLEX) 15 MG 24 hr tablet Take 15 mg by mouth daily.       Marland Kitchen dexlansoprazole (DEXILANT) 60 MG capsule Take 60 mg by mouth daily.      Marland Kitchen DIOVAN 40 MG tablet TAKE 1 TABLET BY MOUTH DAILY  30 tablet  2  . hydrOXYzine (ATARAX/VISTARIL) 25 MG tablet Take 50 mg by mouth daily.      Marland Kitchen imatinib (GLEEVEC) 400 MG tablet Take 1 tablet (400 mg total) by mouth daily. Take with meals and large glass of water.Caution:Chemotherapy.  30 tablet  2  . isosorbide mononitrate (IMDUR) 60 MG 24 hr tablet Take 60 mg by mouth daily.      . methocarbamol (ROBAXIN) 750 MG tablet Take 750 mg by mouth as needed for muscle spasms.      . metoprolol succinate (TOPROL-XL) 100 MG 24 hr tablet Take 100 mg by mouth daily. Take with or immediately following a meal.      . Mirabegron ER (MYRBETRIQ) 25 MG TB24 Take 25 mg by mouth daily.      . nitroGLYCERIN (NITROLINGUAL) 0.4 MG/SPRAY spray Place 1 spray under the tongue every 5 (five)  minutes as needed for chest pain. For chest pain  12 g  11  . Nitroglycerin 0.4 % OINT Place 0.125 % rectally 3 (three) times daily.      . Omega-3 Fatty Acids (FISH OIL TRIPLE STRENGTH) 1400 MG CAPS Take by mouth.       No current facility-administered medications for this visit.    SURGICAL HISTORY:  Past Surgical History  Procedure Laterality Date  . Carotid stent    . Tonsillectomy    . Fracture surgery      rt wrist  . Toe surgery      rt great toe  . Wrist surgery      rt and lt cysts removed  . Orif ankle fracture  07/04/2011    Procedure: OPEN REDUCTION INTERNAL FIXATION (ORIF) ANKLE FRACTURE;  Surgeon: Alta Corning, MD;  Location: [facility];  Service: Orthopedics;  Laterality: Right;  . Cardiac catheterization      normal EF, patent  stents without obstructive disease (on Plavix X 1 month only)    REVIEW OF SYSTEMS:  Constitutional: negative Eyes: negative Ears, nose, mouth, throat, and face: negative Respiratory: negative Cardiovascular: negative Gastrointestinal: negative Genitourinary:negative Integument/breast: negative Hematologic/lymphatic: negative Musculoskeletal:negative Neurological: negative Behavioral/Psych: negative Endocrine: negative Allergic/Immunologic: negative   PHYSICAL EXAMINATION: General appearance: alert, cooperative and no distress Head: Normocephalic, without obvious abnormality, atraumatic Neck: no adenopathy, no JVD, supple, symmetrical, trachea midline and thyroid not enlarged, symmetric, no tenderness/mass/nodules Lymph nodes: Cervical, supraclavicular, and axillary nodes normal. Resp: clear to auscultation bilaterally Back: symmetric, no curvature. ROM normal. No CVA tenderness. Cardio: regular rate and rhythm, S1, S2 normal, no murmur, click, rub or gallop GI: soft, non-tender; bowel sounds normal; no masses,  no organomegaly Extremities: extremities normal, atraumatic, no cyanosis or edema Neurologic: Alert and oriented X 3, normal strength and tone. Normal symmetric reflexes. Normal coordination and gait  ECOG PERFORMANCE STATUS: 0 - Asymptomatic  Blood pressure 105/67, pulse 72, temperature 97.3 F (36.3 C), temperature source Oral, resp. rate 18, height 5\' 7"  (1.702 m), weight 190 lb 12.8 oz (86.546 kg), SpO2 100.00%.  LABORATORY DATA: Lab Results  Component Value Date   WBC 3.7* 03/24/2013   HGB 9.5* 03/24/2013   HCT 28.3* 03/24/2013   MCV 112.7* 03/24/2013   PLT 74* 03/24/2013      Chemistry      Component Value Date/Time   NA 138 02/23/2013 0826   NA 140 02/03/2012 1345   K 4.0 02/23/2013 0826   K 3.7 02/03/2012 1345   CL 105 02/03/2012 1345   CO2 24 02/23/2013 0826   CO2 26 02/03/2012 1345   BUN 14.3 02/23/2013 0826   BUN 8 02/03/2012 1345   CREATININE 1.2  02/23/2013 0826   CREATININE 1.30 02/03/2012 1345      Component Value Date/Time   CALCIUM 8.8 02/23/2013 0826   CALCIUM 9.1 02/03/2012 1345   ALKPHOS 69 02/23/2013 0826   ALKPHOS 46 08/26/2010 1756   AST 17 02/23/2013 0826   AST 19 08/26/2010 1756   ALT 13 02/23/2013 0826   ALT 23 08/26/2010 1756   BILITOT 0.45 02/23/2013 0826   BILITOT 0.1* 08/26/2010 1756       RADIOGRAPHIC STUDIES: No results found.  ASSESSMENT AND PLAN: This is a very pleasant 58 years old [place] American male recently diagnosed with chronic myeloid leukemia. He is currently on treatment with Gleevec 400 mg by mouth daily and tolerating it fairly well. He a significant improvement in the total  white blood count. I will continue to monitor his CBC closely. We'll recheck his potassium today for the recurrent lower extremity cramps. I would see him back for followup visit in 4 weeks for reevaluation with repeat blood work. He was advised to call immediately if he has any concerning symptoms in the interval.  The patient voices understanding of current disease status and treatment options and is in agreement with the current care plan.  All questions were answered. The patient knows to call the clinic with any problems, questions or concerns. We can certainly see the patient much sooner if necessary.

## 2013-03-24 NOTE — Telephone Encounter (Signed)
gv and printed appt sched and avs for pt for Feb  °

## 2013-03-24 NOTE — Patient Instructions (Signed)
Followup visit in one month with repeat blood work. 

## 2013-04-15 ENCOUNTER — Other Ambulatory Visit: Payer: Self-pay | Admitting: *Deleted

## 2013-04-15 DIAGNOSIS — C9210 Chronic myeloid leukemia, BCR/ABL-positive, not having achieved remission: Secondary | ICD-10-CM

## 2013-04-15 MED ORDER — IMATINIB MESYLATE 400 MG PO TABS: 400.0000 mg | ORAL_TABLET | Freq: Every day | ORAL | Status: DC

## 2013-04-16 ENCOUNTER — Other Ambulatory Visit: Payer: Self-pay

## 2013-04-16 MED ORDER — VALSARTAN 40 MG PO TABS: ORAL_TABLET | ORAL | Status: DC

## 2013-04-17 ENCOUNTER — Telehealth: Payer: Self-pay | Admitting: Medical Oncology

## 2013-04-17 NOTE — Telephone Encounter (Signed)
His 56 year old son just diagnosed with infulenza A and started on tamiflu . Son's  symptoms are vomiting, muscle aches, fever 102 F. Pt calling about what does he need to do. Per Burnetta Sabin I told him to practice good handwashing , avoid contact with son's secretions, etc and  monitor for symptoms and to call for any of same symptoms. Pt reports he does have a cough and I told him to call if it worsens.

## 2013-04-17 NOTE — Telephone Encounter (Signed)
Pt called back and said his pharmacist said to ask if his doctor if he wanted to give him tamiflu as preventative since he was exposed.  I called Adrena and she said she is not inclined to order tamiflu since he does not have symptoms  and to call back if he develops flu like symptoms.Pt notified of this and to call for any flu-like symptoms.

## 2013-04-21 ENCOUNTER — Encounter: Payer: Self-pay | Admitting: Physician Assistant

## 2013-04-21 ENCOUNTER — Ambulatory Visit (HOSPITAL_BASED_OUTPATIENT_CLINIC_OR_DEPARTMENT_OTHER): Payer: 59 | Admitting: Physician Assistant

## 2013-04-21 ENCOUNTER — Other Ambulatory Visit (HOSPITAL_BASED_OUTPATIENT_CLINIC_OR_DEPARTMENT_OTHER): Payer: 59

## 2013-04-21 ENCOUNTER — Telehealth: Payer: Self-pay | Admitting: Internal Medicine

## 2013-04-21 VITALS — BP 132/84 | HR 86 | Temp 98.4°F | Resp 18 | Ht 67.0 in | Wt 177.5 lb

## 2013-04-21 DIAGNOSIS — C9210 Chronic myeloid leukemia, BCR/ABL-positive, not having achieved remission: Secondary | ICD-10-CM

## 2013-04-21 DIAGNOSIS — J111 Influenza due to unidentified influenza virus with other respiratory manifestations: Secondary | ICD-10-CM

## 2013-04-21 DIAGNOSIS — R634 Abnormal weight loss: Secondary | ICD-10-CM

## 2013-04-21 DIAGNOSIS — Z8546 Personal history of malignant neoplasm of prostate: Secondary | ICD-10-CM

## 2013-04-21 LAB — COMPREHENSIVE METABOLIC PANEL (CC13)
ALK PHOS: 71 U/L (ref 40–150)
ALT: 14 U/L (ref 0–55)
AST: 34 U/L (ref 5–34)
Albumin: 4.4 g/dL (ref 3.5–5.0)
Anion Gap: 12 mEq/L — ABNORMAL HIGH (ref 3–11)
BUN: 12.9 mg/dL (ref 7.0–26.0)
CO2: 23 mEq/L (ref 22–29)
Calcium: 9.1 mg/dL (ref 8.4–10.4)
Chloride: 107 mEq/L (ref 98–109)
Creatinine: 1.1 mg/dL (ref 0.7–1.3)
Glucose: 101 mg/dl (ref 70–140)
Potassium: 3.5 mEq/L (ref 3.5–5.1)
SODIUM: 142 meq/L (ref 136–145)
TOTAL PROTEIN: 7.4 g/dL (ref 6.4–8.3)
Total Bilirubin: 0.39 mg/dL (ref 0.20–1.20)

## 2013-04-21 LAB — CBC WITH DIFFERENTIAL/PLATELET
BASO%: 3 % — ABNORMAL HIGH (ref 0.0–2.0)
Basophils Absolute: 0.1 10*3/uL (ref 0.0–0.1)
EOS%: 1.9 % (ref 0.0–7.0)
Eosinophils Absolute: 0.1 10*3/uL (ref 0.0–0.5)
HEMATOCRIT: 32.9 % — AB (ref 38.4–49.9)
HGB: 11.3 g/dL — ABNORMAL LOW (ref 13.0–17.1)
LYMPH%: 41.2 % (ref 14.0–49.0)
MCH: 36.3 pg — ABNORMAL HIGH (ref 27.2–33.4)
MCHC: 34.3 g/dL (ref 32.0–36.0)
MCV: 105.8 fL — ABNORMAL HIGH (ref 79.3–98.0)
MONO#: 1.3 10*3/uL — ABNORMAL HIGH (ref 0.1–0.9)
MONO%: 28.9 % — AB (ref 0.0–14.0)
NEUT%: 25 % — ABNORMAL LOW (ref 39.0–75.0)
NEUTROS ABS: 1.2 10*3/uL — AB (ref 1.5–6.5)
PLATELETS: 108 10*3/uL — AB (ref 140–400)
RBC: 3.11 10*6/uL — AB (ref 4.20–5.82)
RDW: 14.2 % (ref 11.0–14.6)
WBC: 4.6 10*3/uL (ref 4.0–10.3)
lymph#: 1.9 10*3/uL (ref 0.9–3.3)

## 2013-04-21 LAB — LACTATE DEHYDROGENASE (CC13): LDH: 223 U/L (ref 125–245)

## 2013-04-21 NOTE — Telephone Encounter (Signed)
Gave pt aappt for lab and MD for March 2015

## 2013-04-21 NOTE — Progress Notes (Addendum)
[place] [phone]   [phone]  SHARED VISIT PROGRESS NOTE  Charles Greenland, MD [address] [place] [address]  DIAGNOSIS:  1) Chronic myeloid leukemia diagnosed in November of 2014. 2) history of prostate adenocarcinoma diagnosed in 2006  PRIOR THERAPY: Status post prostatectomy.  CURRENT THERAPY: Gleevec 400 mg by mouth daily, started 02/10/2013.  INTERVAL HISTORY: [name] 58 y.o. male returns to the clinic today for followup visit. He is currently on a course of chemotherapy. His 40 year old son was recently diagnosed with the flu and placed on Tamiflu and the patient himself came down with symptoms over the weekend. He reports that he is been off of his [place] for the past 2 weeks secondary to an issue with his pharmacy. This is been resolved and he just received his Gleevec yesterday. He has not felt well due to his flu symptoms and has not had much in the way of solid food. He is stressed/distress today do to issues he is having with his 55 year old son. He states that he will soon be seeing a new therapist and hopefully some of his behavioral issues and combativeness will calm down. His flu symptoms included generalized achiness, malaise, cough, nausea and vomiting. He used a vaporizer/humidifier last night and slept the best these slept in several days. He is feeling significantly better. He voiced no other complaints. He denied having any significant chest pain,  current cough or hemoptysis. He has not had any recent nausea, vomiting. Deniedchange in bowel movement. The patient denied having any fever or chills. He has lost a few pounds recently but no significant night sweats.   MEDICAL HISTORY: Past Medical History  Diagnosis Date  . Myocardial infarction 02,6/12  . Hypertension   . Arthritis   . Cancer     hx prostate cancer  . Bladder disorder   . Coronary artery disease     with prior MI 2002(BMS)  and Cfx(2006) and RCA (2004) stents . EF 45-50%  . Hyperlipidemia   . Coronary atherosclerosis of native coronary artery     stable and suspect a component of CAS  . Diverticulosis     seen on colonoscopy in 2008  . H/O colonoscopy 2014    ALLERGIES:  is allergic to contrast media.  MEDICATIONS:  Current Outpatient Prescriptions  Medication Sig Dispense Refill  . Ascorbic Acid (VITAMIN C) 1000 MG tablet Take 1,000 mg by mouth daily.      Marland Kitchen aspirin 81 MG chewable tablet Chew 162 mg by mouth daily.       Marland Kitchen atorvastatin (LIPITOR) 10 MG tablet Take 10 mg by mouth daily.      . cholecalciferol (VITAMIN D) 1000 UNITS tablet Take 5,000 Units by mouth daily.       Marland Kitchen darifenacin (ENABLEX) 15 MG 24 hr tablet Take 15 mg by mouth daily.       Marland Kitchen dexlansoprazole (DEXILANT) 60 MG capsule Take 60 mg by mouth daily.      . hydrOXYzine (ATARAX/VISTARIL) 25 MG tablet Take 50 mg by mouth daily.      Marland Kitchen imatinib (GLEEVEC) 400 MG tablet Take 1 tablet (400 mg total) by mouth daily. Take with meals and large glass of water.Caution:Chemotherapy.  30 tablet  2  . isosorbide mononitrate (IMDUR) 60 MG 24 hr tablet Take 60 mg by mouth daily.      . metoprolol succinate (TOPROL-XL) 100 MG 24 hr tablet Take 100 mg by  mouth daily. Take with or immediately following a meal.      . Mirabegron ER (MYRBETRIQ) 25 MG TB24 Take 25 mg by mouth daily.      . Nitroglycerin 0.4 % OINT Place 0.125 % rectally 3 (three) times daily.      . Omega-3 Fatty Acids (FISH OIL TRIPLE STRENGTH) 1400 MG CAPS Take by mouth.      . oseltamivir (TAMIFLU) 75 MG capsule Take 75 mg by mouth.      . valsartan (DIOVAN) 40 MG tablet TAKE 1 TABLET BY MOUTH DAILY  30 tablet  6  . methocarbamol (ROBAXIN) 750 MG tablet Take 750 mg by mouth as needed for muscle spasms.      . nitroGLYCERIN (NITROLINGUAL) 0.4 MG/SPRAY spray Place 1 spray under the tongue every 5 (five) minutes as needed for chest pain. For chest pain  12 g  11   No current  facility-administered medications for this visit.    SURGICAL HISTORY:  Past Surgical History  Procedure Laterality Date  . Carotid stent    . Tonsillectomy    . Fracture surgery      rt wrist  . Toe surgery      rt great toe  . Wrist surgery      rt and lt cysts removed  . Orif ankle fracture  07/04/2011    Procedure: OPEN REDUCTION INTERNAL FIXATION (ORIF) ANKLE FRACTURE;  Surgeon: Alta Corning, MD;  Location: [facility];  Service: Orthopedics;  Laterality: Right;  . Cardiac catheterization      normal EF, patent stents without obstructive disease (on Plavix X 1 month only)    REVIEW OF SYSTEMS:  Constitutional: positive for fatigue and malaise Eyes: negative Ears, nose, mouth, throat, and face: negative Respiratory: positive for cough Cardiovascular: negative Gastrointestinal: positive for nausea and vomiting Genitourinary:negative Integument/breast: negative Hematologic/lymphatic: negative Musculoskeletal:negative Neurological: negative Behavioral/Psych: positive for increased stress related to his 80 year old son with ADD, behavorial and combative issues Endocrine: negative Allergic/Immunologic: negative   PHYSICAL EXAMINATION: General appearance: alert, cooperative and no distress Head: Normocephalic, without obvious abnormality, atraumatic Neck: no adenopathy, no JVD, supple, symmetrical, trachea midline and thyroid not enlarged, symmetric, no tenderness/mass/nodules Lymph nodes: Cervical, supraclavicular, and axillary nodes normal. Resp: clear to auscultation bilaterally Back: symmetric, no curvature. ROM normal. No CVA tenderness. Cardio: regular rate and rhythm, S1, S2 normal, no murmur, click, rub or gallop GI: soft, non-tender; bowel sounds normal; no masses,  no organomegaly Extremities: extremities normal, atraumatic, no cyanosis or edema Neurologic: Alert and oriented X 3, normal strength and tone. Normal symmetric reflexes. Normal coordination  and gait  ECOG PERFORMANCE STATUS: 0 - Asymptomatic  Blood pressure 132/84, pulse 86, temperature 98.4 F (36.9 C), temperature source Oral, resp. rate 18, height '5\' 7"'  (1.702 m), weight 177 lb 8 oz (80.513 kg).  LABORATORY DATA: Lab Results  Component Value Date   WBC 4.6 04/21/2013   HGB 11.3* 04/21/2013   HCT 32.9* 04/21/2013   MCV 105.8* 04/21/2013   PLT 108* 04/21/2013      Chemistry      Component Value Date/Time   NA 142 04/21/2013 0830   NA 140 02/03/2012 1345   K 3.5 04/21/2013 0830   K 3.7 02/03/2012 1345   CL 105 02/03/2012 1345   CO2 23 04/21/2013 0830   CO2 26 02/03/2012 1345   BUN 12.9 04/21/2013 0830   BUN 8 02/03/2012 1345   CREATININE 1.1 04/21/2013 0830   CREATININE 1.30  02/03/2012 1345      Component Value Date/Time   CALCIUM 9.1 04/21/2013 0830   CALCIUM 9.1 02/03/2012 1345   ALKPHOS 71 04/21/2013 0830   ALKPHOS 46 08/26/2010 1756   AST 34 04/21/2013 0830   AST 19 08/26/2010 1756   ALT 14 04/21/2013 0830   ALT 23 08/26/2010 1756   BILITOT 0.39 04/21/2013 0830   BILITOT 0.1* 08/26/2010 1756       RADIOGRAPHIC STUDIES: No results found.  ASSESSMENT AND PLAN: This is a very pleasant 58 years old [place] American male recently diagnosed with chronic myeloid leukemia. He is currently on treatment with Gleevec 400 mg by mouth daily, however has not been taking it for the past 2 weeks secondary to an issue with his pharmacy that is now resolved. Overall he is tolerating the [place] without difficulty. Patient was discussed with an also seen by Dr. [name]. He was advised to restart his Gleevec at the current dose of 400 mg by mouth daily. He will followup in one month for another symptom management visit with a repeat CBC differential, C. met and LDH. His recent weight loss may be related to both the increased level of stress that he is dealing with as well as his recent flu illness. We'll continue to monitor his weight closely. He has had a significant improvement in the total  white blood count.  He was advised to call immediately if he has any concerning symptoms in the interval.  The patient voices understanding of current disease status and treatment options and is in agreement with the current care plan.  All questions were answered. The patient knows to call the clinic with any problems, questions or concerns. We can certainly see the patient much sooner if necessary.   Carlton Adam PA-C  ADDENDUM: Hematology/Oncology Attending:  I had the face to face encounter with the patient. I recommended his care plan. The patient is a very pleasant 58 years old [place] American male with history of chronic myeloid leukemia currently on treatment with Gleevec 400 mg by mouth daily and tolerating it fairly well. He has been off his medication for the last week secondary to insurance issues with change in the pharmacy delivering his medications. He finally received his medication recently. She has been complaining of flulike symptoms and was started on treatment with Tamiflu. I recommended for the patient to resume his treatment with Gleevec as ordered. He would come back for followup visit in one month's for reevaluation. He was advised to call immediately if he has any concerning symptoms in the interval. Disclaimer: This note was dictated with voice recognition software. Similar sounding words can inadvertently be transcribed and may not be corrected upon review. Eilleen Kempf., MD 04/21/2013

## 2013-04-21 NOTE — Patient Instructions (Signed)
Continue taking Gleevec 400 mg by mouth daily Follow up in 1 month

## 2013-05-20 ENCOUNTER — Encounter: Payer: Self-pay | Admitting: Internal Medicine

## 2013-05-20 ENCOUNTER — Other Ambulatory Visit (HOSPITAL_BASED_OUTPATIENT_CLINIC_OR_DEPARTMENT_OTHER): Payer: 59

## 2013-05-20 ENCOUNTER — Telehealth: Payer: Self-pay | Admitting: Internal Medicine

## 2013-05-20 ENCOUNTER — Ambulatory Visit (HOSPITAL_BASED_OUTPATIENT_CLINIC_OR_DEPARTMENT_OTHER): Payer: 59 | Admitting: Internal Medicine

## 2013-05-20 VITALS — BP 124/81 | HR 71 | Temp 98.2°F | Resp 18 | Ht 67.0 in | Wt 180.7 lb

## 2013-05-20 DIAGNOSIS — C9210 Chronic myeloid leukemia, BCR/ABL-positive, not having achieved remission: Secondary | ICD-10-CM

## 2013-05-20 LAB — CBC WITH DIFFERENTIAL/PLATELET
BASO%: 1 % (ref 0.0–2.0)
BASOS ABS: 0 10*3/uL (ref 0.0–0.1)
EOS%: 3.5 % (ref 0.0–7.0)
Eosinophils Absolute: 0.1 10*3/uL (ref 0.0–0.5)
HEMATOCRIT: 34.5 % — AB (ref 38.4–49.9)
HEMOGLOBIN: 11.6 g/dL — AB (ref 13.0–17.1)
LYMPH#: 1.8 10*3/uL (ref 0.9–3.3)
LYMPH%: 46.7 % (ref 14.0–49.0)
MCH: 36 pg — ABNORMAL HIGH (ref 27.2–33.4)
MCHC: 33.7 g/dL (ref 32.0–36.0)
MCV: 107 fL — ABNORMAL HIGH (ref 79.3–98.0)
MONO#: 0.4 10*3/uL (ref 0.1–0.9)
MONO%: 11.5 % (ref 0.0–14.0)
NEUT%: 37.3 % — ABNORMAL LOW (ref 39.0–75.0)
NEUTROS ABS: 1.5 10*3/uL (ref 1.5–6.5)
Platelets: 98 10*3/uL — ABNORMAL LOW (ref 140–400)
RBC: 3.23 10*6/uL — ABNORMAL LOW (ref 4.20–5.82)
RDW: 13.7 % (ref 11.0–14.6)
WBC: 3.9 10*3/uL — AB (ref 4.0–10.3)

## 2013-05-20 LAB — COMPREHENSIVE METABOLIC PANEL (CC13)
ALBUMIN: 3.9 g/dL (ref 3.5–5.0)
ALT: 12 U/L (ref 0–55)
ANION GAP: 7 meq/L (ref 3–11)
AST: 15 U/L (ref 5–34)
Alkaline Phosphatase: 55 U/L (ref 40–150)
BUN: 10.1 mg/dL (ref 7.0–26.0)
CALCIUM: 8.9 mg/dL (ref 8.4–10.4)
CHLORIDE: 109 meq/L (ref 98–109)
CO2: 23 meq/L (ref 22–29)
CREATININE: 1 mg/dL (ref 0.7–1.3)
Glucose: 105 mg/dl (ref 70–140)
POTASSIUM: 3.9 meq/L (ref 3.5–5.1)
Sodium: 140 mEq/L (ref 136–145)
Total Bilirubin: 0.28 mg/dL (ref 0.20–1.20)
Total Protein: 6.6 g/dL (ref 6.4–8.3)

## 2013-05-20 LAB — LACTATE DEHYDROGENASE (CC13): LDH: 168 U/L (ref 125–245)

## 2013-05-20 NOTE — Telephone Encounter (Signed)
gv and printed appt sched and avs for pt for April  °

## 2013-05-20 NOTE — Progress Notes (Signed)
[name]:(336) [phone]   [phone]) [phone]  OFFICE PROGRESS NOTE  [name], [name] [address]  DIAGNOSIS:  1) Chronic myeloid leukemia diagnosed in November of 2014. 2) history of prostate adenocarcinoma diagnosed in 2006  PRIOR THERAPY: Status post prostatectomy.  CURRENT THERAPY: Gleevec 400 mg by mouth daily, started 02/10/2013.  INTERVAL HISTORY: [name] 58 y.o. male returns to the clinic today for followup visit. The patient is feeling fine today with no specific complaints. He was started on treatment with Gleevec on 02/10/2013 and tolerating it fairly well. He denied having any significant chest pain, cough or hemoptysis. He has no nausea, vomiting or change in bowel movement. The patient denied having any fever or chills. He lost a few pounds recently but no significant night sweats. He is here today for evaluation and discussion of his lab results.   MEDICAL HISTORY: Past Medical History  Diagnosis Date  . Myocardial infarction 02,6/12  . Hypertension   . Arthritis   . Cancer     hx prostate cancer  . Bladder disorder   . Coronary artery disease     with prior MI 2002(BMS) and Cfx(2006) and RCA (2004) stents . EF 45-50%  . Hyperlipidemia   . Coronary atherosclerosis of native coronary artery     stable and suspect a component of CAS  . Diverticulosis     seen on colonoscopy in 2008  . H/O colonoscopy 2014  . Prostate cancer     ALLERGIES:  is allergic to contrast media.  MEDICATIONS:  Current Outpatient Prescriptions  Medication Sig Dispense Refill  . Ascorbic Acid (VITAMIN C) 1000 MG tablet Take 1,000 mg by mouth daily.      Marland Kitchen aspirin 81 MG chewable tablet Chew 162 mg by mouth daily.       Marland Kitchen atorvastatin (LIPITOR) 10 MG tablet Take 10 mg by mouth daily.      . cholecalciferol (VITAMIN D) 1000 UNITS tablet Take 5,000 Units by mouth daily.       Marland Kitchen darifenacin (ENABLEX) 15 MG  24 hr tablet Take 15 mg by mouth daily.       Marland Kitchen dexlansoprazole (DEXILANT) 60 MG capsule Take 60 mg by mouth daily.      . hydrOXYzine (ATARAX/VISTARIL) 25 MG tablet Take 50 mg by mouth daily.      Marland Kitchen imatinib (GLEEVEC) 400 MG tablet Take 1 tablet (400 mg total) by mouth daily. Take with meals and large glass of water.Caution:Chemotherapy.  30 tablet  2  . isosorbide mononitrate (IMDUR) 60 MG 24 hr tablet Take 60 mg by mouth daily.      . methocarbamol (ROBAXIN) 750 MG tablet Take 750 mg by mouth as needed for muscle spasms.      . metoprolol succinate (TOPROL-XL) 100 MG 24 hr tablet Take 100 mg by mouth daily. Take with or immediately following a meal.      . Mirabegron ER (MYRBETRIQ) 25 MG TB24 Take 25 mg by mouth daily.      . nitroGLYCERIN (NITROLINGUAL) 0.4 MG/SPRAY spray Place 1 spray under the tongue every 5 (five) minutes as needed for chest pain. For chest pain  12 g  11  . Nitroglycerin 0.4 % OINT Place 0.125 % rectally 3 (three) times daily.      . Omega-3 Fatty Acids (FISH OIL TRIPLE STRENGTH) 1400 MG CAPS Take by mouth.      . valsartan (DIOVAN) 40  MG tablet TAKE 1 TABLET BY MOUTH DAILY  30 tablet  6   No current facility-administered medications for this visit.    SURGICAL HISTORY:  Past Surgical History  Procedure Laterality Date  . Carotid stent    . Tonsillectomy    . Fracture surgery      rt wrist  . Toe surgery      rt great toe  . Wrist surgery      rt and lt cysts removed  . Orif ankle fracture  07/04/2011    Procedure: OPEN REDUCTION INTERNAL FIXATION (ORIF) ANKLE FRACTURE;  Surgeon: Alta Corning, MD;  Location: [facility];  Service: Orthopedics;  Laterality: Right;  . Cardiac catheterization      normal EF, patent stents without obstructive disease (on Plavix X 1 month only)    REVIEW OF SYSTEMS:  Constitutional: negative Eyes: negative Ears, nose, mouth, throat, and face: negative Respiratory: negative Cardiovascular:  negative Gastrointestinal: negative Genitourinary:negative Integument/breast: negative Hematologic/lymphatic: negative Musculoskeletal:negative Neurological: negative Behavioral/Psych: negative Endocrine: negative Allergic/Immunologic: negative   PHYSICAL EXAMINATION: General appearance: alert, cooperative and no distress Head: Normocephalic, without obvious abnormality, atraumatic Neck: no adenopathy, no JVD, supple, symmetrical, trachea midline and thyroid not enlarged, symmetric, no tenderness/mass/nodules Lymph nodes: Cervical, supraclavicular, and axillary nodes normal. Resp: clear to auscultation bilaterally Back: symmetric, no curvature. ROM normal. No CVA tenderness. Cardio: regular rate and rhythm, S1, S2 normal, no murmur, click, rub or gallop GI: soft, non-tender; bowel sounds normal; no masses,  no organomegaly Extremities: extremities normal, atraumatic, no cyanosis or edema Neurologic: Alert and oriented X 3, normal strength and tone. Normal symmetric reflexes. Normal coordination and gait  ECOG PERFORMANCE STATUS: 0 - Asymptomatic  Blood pressure 124/81, pulse 71, temperature 98.2 F (36.8 C), temperature source Oral, resp. rate 18, height 5\' 7"  (1.702 m), weight 180 lb 11.2 oz (81.965 kg), SpO2 100.00%.  LABORATORY DATA: Lab Results  Component Value Date   WBC 3.9* 05/20/2013   HGB 11.6* 05/20/2013   HCT 34.5* 05/20/2013   MCV 107.0* 05/20/2013   PLT 98* 05/20/2013      Chemistry      Component Value Date/Time   NA 142 04/21/2013 0830   NA 140 02/03/2012 1345   K 3.5 04/21/2013 0830   K 3.7 02/03/2012 1345   CL 105 02/03/2012 1345   CO2 23 04/21/2013 0830   CO2 26 02/03/2012 1345   BUN 12.9 04/21/2013 0830   BUN 8 02/03/2012 1345   CREATININE 1.1 04/21/2013 0830   CREATININE 1.30 02/03/2012 1345      Component Value Date/Time   CALCIUM 9.1 04/21/2013 0830   CALCIUM 9.1 02/03/2012 1345   ALKPHOS 71 04/21/2013 0830   ALKPHOS 46 08/26/2010 1756   AST 34 04/21/2013  0830   AST 19 08/26/2010 1756   ALT 14 04/21/2013 0830   ALT 23 08/26/2010 1756   BILITOT 0.39 04/21/2013 0830   BILITOT 0.1* 08/26/2010 1756       RADIOGRAPHIC STUDIES: No results found.  ASSESSMENT AND PLAN: This is a very pleasant 58 years old [place] American male recently diagnosed with chronic myeloid leukemia. He is currently on treatment with Gleevec 400 mg by mouth daily and tolerating it fairly well. Her CBC is stable today. I recommended for the patient to continue his current treatment with Gleevec 400 mg by mouth daily. I would see him back for followup visit in one month after repeating CBC, comprehensive metabolic panel, LDH as well as molecular study  for BCR/ABL to evaluate his molecular response.  The patient voices understanding of current disease status and treatment options and is in agreement with the current care plan.  All questions were answered. The patient knows to call the clinic with any problems, questions or concerns. We can certainly see the patient much sooner if necessary.  Disclaimer: This note was dictated with voice recognition software. Similar sounding words can inadvertently be transcribed and may not be corrected upon review.

## 2013-06-10 ENCOUNTER — Other Ambulatory Visit (HOSPITAL_BASED_OUTPATIENT_CLINIC_OR_DEPARTMENT_OTHER): Payer: 59

## 2013-06-10 DIAGNOSIS — C9210 Chronic myeloid leukemia, BCR/ABL-positive, not having achieved remission: Secondary | ICD-10-CM

## 2013-06-10 LAB — COMPREHENSIVE METABOLIC PANEL (CC13)
ALT: 8 U/L (ref 0–55)
AST: 13 U/L (ref 5–34)
Albumin: 4.1 g/dL (ref 3.5–5.0)
Alkaline Phosphatase: 60 U/L (ref 40–150)
Anion Gap: 6 mEq/L (ref 3–11)
BILIRUBIN TOTAL: 0.33 mg/dL (ref 0.20–1.20)
BUN: 12 mg/dL (ref 7.0–26.0)
CO2: 27 mEq/L (ref 22–29)
Calcium: 9 mg/dL (ref 8.4–10.4)
Chloride: 109 mEq/L (ref 98–109)
Creatinine: 1 mg/dL (ref 0.7–1.3)
GLUCOSE: 101 mg/dL (ref 70–140)
Potassium: 3.9 mEq/L (ref 3.5–5.1)
Sodium: 142 mEq/L (ref 136–145)
Total Protein: 6.9 g/dL (ref 6.4–8.3)

## 2013-06-10 LAB — CBC WITH DIFFERENTIAL/PLATELET
BASO%: 1.1 % (ref 0.0–2.0)
Basophils Absolute: 0 10*3/uL (ref 0.0–0.1)
EOS ABS: 0.1 10*3/uL (ref 0.0–0.5)
EOS%: 1.8 % (ref 0.0–7.0)
HCT: 37.1 % — ABNORMAL LOW (ref 38.4–49.9)
HGB: 12.5 g/dL — ABNORMAL LOW (ref 13.0–17.1)
LYMPH%: 37.2 % (ref 14.0–49.0)
MCH: 35.4 pg — ABNORMAL HIGH (ref 27.2–33.4)
MCHC: 33.7 g/dL (ref 32.0–36.0)
MCV: 105.1 fL — AB (ref 79.3–98.0)
MONO#: 0.4 10*3/uL (ref 0.1–0.9)
MONO%: 8.7 % (ref 0.0–14.0)
NEUT%: 51.2 % (ref 39.0–75.0)
NEUTROS ABS: 2.2 10*3/uL (ref 1.5–6.5)
PLATELETS: 154 10*3/uL (ref 140–400)
RBC: 3.53 10*6/uL — ABNORMAL LOW (ref 4.20–5.82)
RDW: 12.7 % (ref 11.0–14.6)
WBC: 4.3 10*3/uL (ref 4.0–10.3)
lymph#: 1.6 10*3/uL (ref 0.9–3.3)

## 2013-06-10 LAB — LACTATE DEHYDROGENASE (CC13): LDH: 173 U/L (ref 125–245)

## 2013-06-16 ENCOUNTER — Other Ambulatory Visit: Payer: Self-pay | Admitting: Internal Medicine

## 2013-06-16 ENCOUNTER — Other Ambulatory Visit: Payer: Self-pay | Admitting: Interventional Cardiology

## 2013-06-17 ENCOUNTER — Encounter: Payer: Self-pay | Admitting: Internal Medicine

## 2013-06-17 ENCOUNTER — Ambulatory Visit (HOSPITAL_BASED_OUTPATIENT_CLINIC_OR_DEPARTMENT_OTHER): Payer: 59 | Admitting: Internal Medicine

## 2013-06-17 VITALS — BP 117/76 | HR 84 | Temp 98.3°F | Resp 19 | Ht 67.0 in

## 2013-06-17 DIAGNOSIS — C9210 Chronic myeloid leukemia, BCR/ABL-positive, not having achieved remission: Secondary | ICD-10-CM

## 2013-06-17 NOTE — Progress Notes (Signed)
[facility] Telephone:(336) [phone]   Fax:(336) [phone]  OFFICE PROGRESS NOTE  [name], [address] [place] [address]  DIAGNOSIS:  1) Chronic myeloid leukemia diagnosed in November of 2014. 2) history of prostate adenocarcinoma diagnosed in 2006  PRIOR THERAPY: Status post prostatectomy.  CURRENT THERAPY: Gleevec 400 mg by mouth daily, started 02/10/2013.  INTERVAL HISTORY: [name] 58 y.o. male returns to the clinic today for followup visit. The patient is feeling fine today with no specific complaints. He was started on treatment with Gleevec on 02/10/2013 and tolerating it fairly well. He denied having any significant chest pain, cough or hemoptysis. He has no nausea, vomiting or change in bowel movement. The patient denied having any fever or chills. He lost a few pounds recently but no significant night sweats. He is here today for evaluation and discussion of his lab results including molecular study for BCR/ABL.   MEDICAL HISTORY: Past Medical History  Diagnosis Date  . Myocardial infarction 02,6/12  . Hypertension   . Arthritis   . Cancer     hx prostate cancer  . Bladder disorder   . Coronary artery disease     with prior MI 2002(BMS) and Cfx(2006) and RCA (2004) stents . EF 45-50%  . Hyperlipidemia   . Coronary atherosclerosis of native coronary artery     stable and suspect a component of CAS  . Diverticulosis     seen on colonoscopy in 2008  . H/O colonoscopy 2014  . Prostate cancer     ALLERGIES:  is allergic to contrast media.  MEDICATIONS:  Current Outpatient Prescriptions  Medication Sig Dispense Refill  . Ascorbic Acid (VITAMIN C) 1000 MG tablet Take 1,000 mg by mouth daily.      Marland Kitchen aspirin 81 MG chewable tablet Chew 162 mg by mouth daily.       Marland Kitchen atorvastatin (LIPITOR) 10 MG tablet Take 10 mg by mouth daily.      . cholecalciferol (VITAMIN D) 1000 UNITS tablet Take 5,000 Units by mouth  daily.       Marland Kitchen darifenacin (ENABLEX) 15 MG 24 hr tablet Take 15 mg by mouth daily.       Marland Kitchen dexlansoprazole (DEXILANT) 60 MG capsule Take 60 mg by mouth daily.      Marland Kitchen GLEEVEC 400 MG tablet Take 1 tablet by mouth  daily with a meal and large glass of water  30 tablet  1  . hydrOXYzine (ATARAX/VISTARIL) 25 MG tablet Take 50 mg by mouth daily.      . isosorbide mononitrate (IMDUR) 60 MG 24 hr tablet Take 60 mg by mouth daily.      . methocarbamol (ROBAXIN) 750 MG tablet Take 750 mg by mouth as needed for muscle spasms.      . Mirabegron ER (MYRBETRIQ) 25 MG TB24 Take 25 mg by mouth daily.      . Nitroglycerin 0.4 % OINT Place 0.125 % rectally 3 (three) times daily.      . Omega-3 Fatty Acids (FISH OIL TRIPLE STRENGTH) 1400 MG CAPS Take by mouth.      . valsartan (DIOVAN) 40 MG tablet TAKE 1 TABLET BY MOUTH DAILY  30 tablet  6  . metoprolol succinate (TOPROL-XL) 100 MG 24 hr tablet Take 100 mg by mouth daily. Take with or immediately following a meal.      . nitroGLYCERIN (NITROLINGUAL) 0.4 MG/SPRAY spray Place 1 spray under the tongue every 5 (  five) minutes as needed for chest pain. For chest pain  12 g  11   No current facility-administered medications for this visit.    SURGICAL HISTORY:  Past Surgical History  Procedure Laterality Date  . Carotid stent    . Tonsillectomy    . Fracture surgery      rt wrist  . Toe surgery      rt great toe  . Wrist surgery      rt and lt cysts removed  . Orif ankle fracture  07/04/2011    Procedure: OPEN REDUCTION INTERNAL FIXATION (ORIF) ANKLE FRACTURE;  Surgeon: Alta Corning, MD;  Location: [facility];  Service: Orthopedics;  Laterality: Right;  . Cardiac catheterization      normal EF, patent stents without obstructive disease (on Plavix X 1 month only)    REVIEW OF SYSTEMS:  Constitutional: negative Eyes: negative Ears, nose, mouth, throat, and face: negative Respiratory: negative Cardiovascular: negative Gastrointestinal:  negative Genitourinary:negative Integument/breast: negative Hematologic/lymphatic: negative Musculoskeletal:negative Neurological: negative Behavioral/Psych: negative Endocrine: negative Allergic/Immunologic: negative   PHYSICAL EXAMINATION: General appearance: alert, cooperative and no distress Head: Normocephalic, without obvious abnormality, atraumatic Neck: no adenopathy, no JVD, supple, symmetrical, trachea midline and thyroid not enlarged, symmetric, no tenderness/mass/nodules Lymph nodes: Cervical, supraclavicular, and axillary nodes normal. Resp: clear to auscultation bilaterally Back: symmetric, no curvature. ROM normal. No CVA tenderness. Cardio: regular rate and rhythm, S1, S2 normal, no murmur, click, rub or gallop GI: soft, non-tender; bowel sounds normal; no masses,  no organomegaly Extremities: extremities normal, atraumatic, no cyanosis or edema Neurologic: Alert and oriented X 3, normal strength and tone. Normal symmetric reflexes. Normal coordination and gait  ECOG PERFORMANCE STATUS: 0 - Asymptomatic  Blood pressure 117/76, pulse 84, temperature 98.3 F (36.8 C), temperature source Oral, resp. rate 19, height 5\' 7"  (1.702 m), SpO2 100.00%.  LABORATORY DATA: Lab Results  Component Value Date   WBC 4.3 06/10/2013   HGB 12.5* 06/10/2013   HCT 37.1* 06/10/2013   MCV 105.1* 06/10/2013   PLT 154 06/10/2013      Chemistry      Component Value Date/Time   NA 142 06/10/2013 0842   NA 140 02/03/2012 1345   K 3.9 06/10/2013 0842   K 3.7 02/03/2012 1345   CL 105 02/03/2012 1345   CO2 27 06/10/2013 0842   CO2 26 02/03/2012 1345   BUN 12.0 06/10/2013 0842   BUN 8 02/03/2012 1345   CREATININE 1.0 06/10/2013 0842   CREATININE 1.30 02/03/2012 1345      Component Value Date/Time   CALCIUM 9.0 06/10/2013 0842   CALCIUM 9.1 02/03/2012 1345   ALKPHOS 60 06/10/2013 0842   ALKPHOS 46 08/26/2010 1756   AST 13 06/10/2013 0842   AST 19 08/26/2010 1756   ALT 8 06/10/2013 0842   ALT 23 08/26/2010 1756    BILITOT 0.33 06/10/2013 0842   BILITOT 0.1* 08/26/2010 1756       RADIOGRAPHIC STUDIES: No results found.  ASSESSMENT AND PLAN: This is a very pleasant 58 years old [place] American male recently diagnosed with chronic myeloid leukemia. He is currently on treatment with Gleevec 400 mg by mouth daily and tolerating it fairly well. Her CBC is stable today. His molecular study for BCR/ABL showed significant improvement in his disease. I discussed the lab result with the patient today. I recommended for the patient to continue his current treatment with Gleevec 400 mg by mouth daily. I would see him back for followup visit  in one month after repeating CBC, comprehensive metabolic panel, LDH.  The patient voices understanding of current disease status and treatment options and is in agreement with the current care plan.  All questions were answered. The patient knows to call the clinic with any problems, questions or concerns. We can certainly see the patient much sooner if necessary.  Disclaimer: This note was dictated with voice recognition software. Similar sounding words can inadvertently be transcribed and may not be corrected upon review.

## 2013-06-29 ENCOUNTER — Other Ambulatory Visit: Payer: Self-pay | Admitting: Internal Medicine

## 2013-06-29 DIAGNOSIS — C9210 Chronic myeloid leukemia, BCR/ABL-positive, not having achieved remission: Secondary | ICD-10-CM

## 2013-06-30 ENCOUNTER — Telehealth: Payer: Self-pay | Admitting: Internal Medicine

## 2013-06-30 NOTE — Telephone Encounter (Signed)
returned pt call and lvm with May appt...mailed pt appt sched/letter

## 2013-07-16 ENCOUNTER — Ambulatory Visit (HOSPITAL_BASED_OUTPATIENT_CLINIC_OR_DEPARTMENT_OTHER): Payer: 59 | Admitting: Internal Medicine

## 2013-07-16 ENCOUNTER — Encounter: Payer: Self-pay | Admitting: Internal Medicine

## 2013-07-16 ENCOUNTER — Other Ambulatory Visit (HOSPITAL_BASED_OUTPATIENT_CLINIC_OR_DEPARTMENT_OTHER): Payer: 59

## 2013-07-16 ENCOUNTER — Telehealth: Payer: Self-pay | Admitting: Internal Medicine

## 2013-07-16 VITALS — BP 118/81 | HR 71 | Temp 98.4°F | Resp 19 | Ht 67.0 in | Wt 178.5 lb

## 2013-07-16 DIAGNOSIS — C9210 Chronic myeloid leukemia, BCR/ABL-positive, not having achieved remission: Secondary | ICD-10-CM

## 2013-07-16 DIAGNOSIS — Z8546 Personal history of malignant neoplasm of prostate: Secondary | ICD-10-CM

## 2013-07-16 DIAGNOSIS — D649 Anemia, unspecified: Secondary | ICD-10-CM

## 2013-07-16 LAB — COMPREHENSIVE METABOLIC PANEL (CC13)
ALBUMIN: 3.9 g/dL (ref 3.5–5.0)
ALT: 13 U/L (ref 0–55)
AST: 17 U/L (ref 5–34)
Alkaline Phosphatase: 52 U/L (ref 40–150)
Anion Gap: 10 mEq/L (ref 3–11)
BILIRUBIN TOTAL: 0.42 mg/dL (ref 0.20–1.20)
BUN: 10.4 mg/dL (ref 7.0–26.0)
CO2: 24 mEq/L (ref 22–29)
Calcium: 9.3 mg/dL (ref 8.4–10.4)
Chloride: 107 mEq/L (ref 98–109)
Creatinine: 1.2 mg/dL (ref 0.7–1.3)
Glucose: 94 mg/dl (ref 70–140)
POTASSIUM: 4 meq/L (ref 3.5–5.1)
SODIUM: 141 meq/L (ref 136–145)
TOTAL PROTEIN: 6.7 g/dL (ref 6.4–8.3)

## 2013-07-16 LAB — CBC WITH DIFFERENTIAL/PLATELET
BASO%: 0.8 % (ref 0.0–2.0)
Basophils Absolute: 0 10*3/uL (ref 0.0–0.1)
EOS%: 2 % (ref 0.0–7.0)
Eosinophils Absolute: 0.1 10*3/uL (ref 0.0–0.5)
HCT: 38.7 % (ref 38.4–49.9)
HGB: 12.9 g/dL — ABNORMAL LOW (ref 13.0–17.1)
LYMPH%: 36.6 % (ref 14.0–49.0)
MCH: 34.1 pg — ABNORMAL HIGH (ref 27.2–33.4)
MCHC: 33.4 g/dL (ref 32.0–36.0)
MCV: 102 fL — AB (ref 79.3–98.0)
MONO#: 0.5 10*3/uL (ref 0.1–0.9)
MONO%: 8.4 % (ref 0.0–14.0)
NEUT#: 3 10*3/uL (ref 1.5–6.5)
NEUT%: 52.2 % (ref 39.0–75.0)
PLATELETS: 196 10*3/uL (ref 140–400)
RBC: 3.79 10*6/uL — AB (ref 4.20–5.82)
RDW: 13.5 % (ref 11.0–14.6)
WBC: 5.8 10*3/uL (ref 4.0–10.3)
lymph#: 2.1 10*3/uL (ref 0.9–3.3)

## 2013-07-16 LAB — LACTATE DEHYDROGENASE (CC13): LDH: 188 U/L (ref 125–245)

## 2013-07-16 NOTE — Telephone Encounter (Signed)
gv adn prnted appt sched and avs for opt fro June

## 2013-07-16 NOTE — Progress Notes (Signed)
[facility] Telephone:(336) [phone]   [phone]  OFFICE PROGRESS NOTE  [name], [address] [place] [address]  DIAGNOSIS:  1) Chronic myeloid leukemia diagnosed in November of 2014. 2) history of prostate adenocarcinoma diagnosed in 2006  PRIOR THERAPY: Status post prostatectomy.  CURRENT THERAPY: Gleevec 400 mg by mouth daily, started 02/10/2013.  INTERVAL HISTORY: [name] 58 y.o. male returns to the clinic today for followup visit. The patient is feeling fine today with no specific complaints. He was started on treatment with Gleevec on 02/10/2013 and tolerating it fairly well, except for occasional swelling and cramps of the lower extremities by the end of the day. He denied having any significant chest pain, cough or hemoptysis. He has no nausea, vomiting or change in bowel movement. The patient denied having any fever or chills. Has no significant weight loss or night sweats.  MEDICAL HISTORY: Past Medical History  Diagnosis Date  . Myocardial infarction 02,6/12  . Hypertension   . Arthritis   . Cancer     hx prostate cancer  . Bladder disorder   . Coronary artery disease     with prior MI 2002(BMS) and Cfx(2006) and RCA (2004) stents . EF 45-50%  . Hyperlipidemia   . Coronary atherosclerosis of native coronary artery     stable and suspect a component of CAS  . Diverticulosis     seen on colonoscopy in 2008  . H/O colonoscopy 2014  . Prostate cancer     ALLERGIES:  is allergic to contrast media.  MEDICATIONS:  Current Outpatient Prescriptions  Medication Sig Dispense Refill  . Ascorbic Acid (VITAMIN C) 1000 MG tablet Take 1,000 mg by mouth daily.      Marland Kitchen aspirin 81 MG chewable tablet Chew 162 mg by mouth daily.       Marland Kitchen atorvastatin (LIPITOR) 10 MG tablet Take 10 mg by mouth daily.      . cholecalciferol (VITAMIN D) 1000 UNITS tablet Take 5,000 Units by mouth daily.       Marland Kitchen darifenacin  (ENABLEX) 15 MG 24 hr tablet Take 15 mg by mouth daily.       Marland Kitchen dexlansoprazole (DEXILANT) 60 MG capsule Take 60 mg by mouth daily.      Marland Kitchen GLEEVEC 400 MG tablet Take 1 tablet by mouth  daily with a meal and large glass of water  30 tablet  1  . hydrOXYzine (ATARAX/VISTARIL) 25 MG tablet Take 50 mg by mouth daily.      . isosorbide mononitrate (IMDUR) 60 MG 24 hr tablet Take 1 tablet by mouth once a day  90 tablet  1  . metoprolol succinate (TOPROL-XL) 100 MG 24 hr tablet Take 1 tablet by mouth once a day  90 tablet  1  . Mirabegron ER (MYRBETRIQ) 25 MG TB24 Take 25 mg by mouth daily.      . Omega-3 Fatty Acids (FISH OIL TRIPLE STRENGTH) 1400 MG CAPS Take by mouth.      . valsartan (DIOVAN) 40 MG tablet TAKE 1 TABLET BY MOUTH DAILY  30 tablet  6  . methocarbamol (ROBAXIN) 750 MG tablet Take 750 mg by mouth as needed for muscle spasms.      . nitroGLYCERIN (NITROLINGUAL) 0.4 MG/SPRAY spray Place 1 spray under the tongue every 5 (five) minutes as needed for chest pain. For chest pain  12 g  11  . Nitroglycerin 0.4 % OINT Place 0.125 %  rectally 3 (three) times daily.       No current facility-administered medications for this visit.    SURGICAL HISTORY:  Past Surgical History  Procedure Laterality Date  . Carotid stent    . Tonsillectomy    . Fracture surgery      rt wrist  . Toe surgery      rt great toe  . Wrist surgery      rt and lt cysts removed  . Orif ankle fracture  07/04/2011    Procedure: OPEN REDUCTION INTERNAL FIXATION (ORIF) ANKLE FRACTURE;  Surgeon: Alta Corning, MD;  Location: [facility];  Service: Orthopedics;  Laterality: Right;  . Cardiac catheterization      normal EF, patent stents without obstructive disease (on Plavix X 1 month only)    REVIEW OF SYSTEMS:  Constitutional: negative Eyes: negative Ears, nose, mouth, throat, and face: negative Respiratory: negative Cardiovascular: negative Gastrointestinal:  negative Genitourinary:negative Integument/breast: negative Hematologic/lymphatic: negative Musculoskeletal:negative Neurological: negative Behavioral/Psych: negative Endocrine: negative Allergic/Immunologic: negative   PHYSICAL EXAMINATION: General appearance: alert, cooperative and no distress Head: Normocephalic, without obvious abnormality, atraumatic Neck: no adenopathy, no JVD, supple, symmetrical, trachea midline and thyroid not enlarged, symmetric, no tenderness/mass/nodules Lymph nodes: Cervical, supraclavicular, and axillary nodes normal. Resp: clear to auscultation bilaterally Back: symmetric, no curvature. ROM normal. No CVA tenderness. Cardio: regular rate and rhythm, S1, S2 normal, no murmur, click, rub or gallop GI: soft, non-tender; bowel sounds normal; no masses,  no organomegaly Extremities: extremities normal, atraumatic, no cyanosis or edema Neurologic: Alert and oriented X 3, normal strength and tone. Normal symmetric reflexes. Normal coordination and gait  ECOG PERFORMANCE STATUS: 0 - Asymptomatic  Blood pressure 118/81, pulse 71, temperature 98.4 F (36.9 C), temperature source Oral, resp. rate 19, height 5\' 7"  (1.702 m), weight 178 lb 8 oz (80.967 kg), SpO2 100.00%.  LABORATORY DATA: Lab Results  Component Value Date   WBC 5.8 07/16/2013   HGB 12.9* 07/16/2013   HCT 38.7 07/16/2013   MCV 102.0* 07/16/2013   PLT 196 07/16/2013      Chemistry      Component Value Date/Time   NA 142 06/10/2013 0842   NA 140 02/03/2012 1345   K 3.9 06/10/2013 0842   K 3.7 02/03/2012 1345   CL 105 02/03/2012 1345   CO2 27 06/10/2013 0842   CO2 26 02/03/2012 1345   BUN 12.0 06/10/2013 0842   BUN 8 02/03/2012 1345   CREATININE 1.0 06/10/2013 0842   CREATININE 1.30 02/03/2012 1345      Component Value Date/Time   CALCIUM 9.0 06/10/2013 0842   CALCIUM 9.1 02/03/2012 1345   ALKPHOS 60 06/10/2013 0842   ALKPHOS 46 08/26/2010 1756   AST 13 06/10/2013 0842   AST 19 08/26/2010 1756   ALT 8  06/10/2013 0842   ALT 23 08/26/2010 1756   BILITOT 0.33 06/10/2013 0842   BILITOT 0.1* 08/26/2010 1756       RADIOGRAPHIC STUDIES: No results found.  ASSESSMENT AND PLAN: This is a very pleasant 58 years old [place] American male recently diagnosed with chronic myeloid leukemia. He is currently on treatment with Gleevec 400 mg by mouth daily and tolerating it fairly well. His CBC today is unremarkable except for mild anemia. I discussed the lab result with the patient today. I recommended for the patient to continue his current treatment with Gleevec 400 mg by mouth daily. I would see him back for followup visit in one month after repeating CBC,  comprehensive metabolic panel, LDH.  The patient voices understanding of current disease status and treatment options and is in agreement with the current care plan.  All questions were answered. The patient knows to call the clinic with any problems, questions or concerns. We can certainly see the patient much sooner if necessary.  Disclaimer: This note was dictated with voice recognition software. Similar sounding words can inadvertently be transcribed and may not be corrected upon review.

## 2013-08-13 ENCOUNTER — Other Ambulatory Visit (HOSPITAL_BASED_OUTPATIENT_CLINIC_OR_DEPARTMENT_OTHER): Payer: 59

## 2013-08-13 ENCOUNTER — Encounter: Payer: Self-pay | Admitting: Internal Medicine

## 2013-08-13 ENCOUNTER — Other Ambulatory Visit: Payer: Self-pay | Admitting: Internal Medicine

## 2013-08-13 ENCOUNTER — Ambulatory Visit (HOSPITAL_BASED_OUTPATIENT_CLINIC_OR_DEPARTMENT_OTHER): Payer: 59 | Admitting: Internal Medicine

## 2013-08-13 VITALS — BP 116/76 | HR 67 | Temp 98.4°F | Resp 18 | Ht 67.0 in | Wt 178.1 lb

## 2013-08-13 DIAGNOSIS — C9210 Chronic myeloid leukemia, BCR/ABL-positive, not having achieved remission: Secondary | ICD-10-CM

## 2013-08-13 DIAGNOSIS — Z8546 Personal history of malignant neoplasm of prostate: Secondary | ICD-10-CM

## 2013-08-13 LAB — CBC WITH DIFFERENTIAL/PLATELET
BASO%: 0.6 % (ref 0.0–2.0)
BASOS ABS: 0 10*3/uL (ref 0.0–0.1)
EOS ABS: 0.1 10*3/uL (ref 0.0–0.5)
EOS%: 2 % (ref 0.0–7.0)
HCT: 36.9 % — ABNORMAL LOW (ref 38.4–49.9)
HEMOGLOBIN: 12.5 g/dL — AB (ref 13.0–17.1)
LYMPH#: 2.2 10*3/uL (ref 0.9–3.3)
LYMPH%: 34.1 % (ref 14.0–49.0)
MCH: 34.1 pg — ABNORMAL HIGH (ref 27.2–33.4)
MCHC: 33.8 g/dL (ref 32.0–36.0)
MCV: 100.8 fL — ABNORMAL HIGH (ref 79.3–98.0)
MONO#: 0.6 10*3/uL (ref 0.1–0.9)
MONO%: 9.7 % (ref 0.0–14.0)
NEUT%: 53.6 % (ref 39.0–75.0)
NEUTROS ABS: 3.5 10*3/uL (ref 1.5–6.5)
Platelets: 208 10*3/uL (ref 140–400)
RBC: 3.66 10*6/uL — ABNORMAL LOW (ref 4.20–5.82)
RDW: 14.7 % — AB (ref 11.0–14.6)
WBC: 6.6 10*3/uL (ref 4.0–10.3)

## 2013-08-13 LAB — COMPREHENSIVE METABOLIC PANEL (CC13)
ALBUMIN: 3.7 g/dL (ref 3.5–5.0)
ALT: 9 U/L (ref 0–55)
AST: 14 U/L (ref 5–34)
Alkaline Phosphatase: 49 U/L (ref 40–150)
Anion Gap: 7 mEq/L (ref 3–11)
BUN: 12.5 mg/dL (ref 7.0–26.0)
CALCIUM: 8.4 mg/dL (ref 8.4–10.4)
CHLORIDE: 109 meq/L (ref 98–109)
CO2: 26 meq/L (ref 22–29)
Creatinine: 1.1 mg/dL (ref 0.7–1.3)
Glucose: 106 mg/dl (ref 70–140)
POTASSIUM: 4.5 meq/L (ref 3.5–5.1)
Sodium: 143 mEq/L (ref 136–145)
Total Bilirubin: 0.28 mg/dL (ref 0.20–1.20)
Total Protein: 6.3 g/dL — ABNORMAL LOW (ref 6.4–8.3)

## 2013-08-13 LAB — LACTATE DEHYDROGENASE (CC13): LDH: 174 U/L (ref 125–245)

## 2013-08-13 NOTE — Progress Notes (Signed)
[facility] Telephone:(336) [phone]   Fax:(336) [phone]  OFFICE PROGRESS NOTE  [name], [address] [address]  DIAGNOSIS:  1) Chronic myeloid leukemia diagnosed in November of 2014. 2) history of prostate adenocarcinoma diagnosed in 2006  PRIOR THERAPY: Status post prostatectomy.  CURRENT THERAPY: Gleevec 400 mg by mouth daily, started 02/10/2013.  INTERVAL HISTORY: [name] 58 y.o. male returns to the clinic today for followup visit. The patient is feeling fine today with no specific complaints. He was started on treatment with Gleevec on 02/10/2013 and tolerating it fairly well. He has occasional shortness of breath. He denied having any significant chest pain, cough or hemoptysis. He has no nausea, vomiting or change in bowel movement. The patient denied having any fever or chills. Has no significant weight loss or night sweats. He has repeat CBC performed earlier today and is here for evaluation and discussion of his lab results.  MEDICAL HISTORY: Past Medical History  Diagnosis Date  . Myocardial infarction 02,6/12  . Hypertension   . Arthritis   . Cancer     hx prostate cancer  . Bladder disorder   . Coronary artery disease     with prior MI 2002(BMS) and Cfx(2006) and RCA (2004) stents . EF 45-50%  . Hyperlipidemia   . Coronary atherosclerosis of native coronary artery     stable and suspect a component of CAS  . Diverticulosis     seen on colonoscopy in 2008  . H/O colonoscopy 2014  . Prostate cancer     ALLERGIES:  is allergic to contrast media.  MEDICATIONS:  Current Outpatient Prescriptions  Medication Sig Dispense Refill  . Ascorbic Acid (VITAMIN C) 1000 MG tablet Take 1,000 mg by mouth daily.      Marland Kitchen aspirin 81 MG chewable tablet Chew 162 mg by mouth daily.       Marland Kitchen atorvastatin (LIPITOR) 10 MG tablet Take 10 mg by mouth daily.      . cholecalciferol (VITAMIN D) 1000 UNITS tablet Take  5,000 Units by mouth daily.       Marland Kitchen darifenacin (ENABLEX) 15 MG 24 hr tablet Take 15 mg by mouth daily.       Marland Kitchen dexlansoprazole (DEXILANT) 60 MG capsule Take 60 mg by mouth daily.      Marland Kitchen GLEEVEC 400 MG tablet Take 1 tablet by mouth  daily with a meal and large glass of water  30 tablet  1  . hydrOXYzine (ATARAX/VISTARIL) 25 MG tablet Take 50 mg by mouth daily.      . isosorbide mononitrate (IMDUR) 60 MG 24 hr tablet Take 1 tablet by mouth once a day  90 tablet  1  . methocarbamol (ROBAXIN) 750 MG tablet Take 750 mg by mouth as needed for muscle spasms.      . metoprolol succinate (TOPROL-XL) 100 MG 24 hr tablet Take 1 tablet by mouth once a day  90 tablet  1  . Mirabegron ER (MYRBETRIQ) 25 MG TB24 Take 25 mg by mouth daily.      . nitroGLYCERIN (NITROLINGUAL) 0.4 MG/SPRAY spray Place 1 spray under the tongue every 5 (five) minutes as needed for chest pain. For chest pain  12 g  11  . Nitroglycerin 0.4 % OINT Place 0.125 % rectally 3 (three) times daily.      . Omega-3 Fatty Acids (FISH OIL TRIPLE STRENGTH) 1400 MG CAPS Take by mouth.      Marland Kitchen  valsartan (DIOVAN) 40 MG tablet TAKE 1 TABLET BY MOUTH DAILY  30 tablet  6   No current facility-administered medications for this visit.    SURGICAL HISTORY:  Past Surgical History  Procedure Laterality Date  . Carotid stent    . Tonsillectomy    . Fracture surgery      rt wrist  . Toe surgery      rt great toe  . Wrist surgery      rt and lt cysts removed  . Orif ankle fracture  07/04/2011    Procedure: OPEN REDUCTION INTERNAL FIXATION (ORIF) ANKLE FRACTURE;  Surgeon: Alta Corning, MD;  Location: [facility];  Service: Orthopedics;  Laterality: Right;  . Cardiac catheterization      normal EF, patent stents without obstructive disease (on Plavix X 1 month only)    REVIEW OF SYSTEMS:  Constitutional: negative Eyes: negative Ears, nose, mouth, throat, and face: negative Respiratory: negative Cardiovascular:  negative Gastrointestinal: negative Genitourinary:negative Integument/breast: negative Hematologic/lymphatic: negative Musculoskeletal:negative Neurological: negative Behavioral/Psych: negative Endocrine: negative Allergic/Immunologic: negative   PHYSICAL EXAMINATION: General appearance: alert, cooperative and no distress Head: Normocephalic, without obvious abnormality, atraumatic Neck: no adenopathy, no JVD, supple, symmetrical, trachea midline and thyroid not enlarged, symmetric, no tenderness/mass/nodules Lymph nodes: Cervical, supraclavicular, and axillary nodes normal. Resp: clear to auscultation bilaterally Back: symmetric, no curvature. ROM normal. No CVA tenderness. Cardio: regular rate and rhythm, S1, S2 normal, no murmur, click, rub or gallop GI: soft, non-tender; bowel sounds normal; no masses,  no organomegaly Extremities: extremities normal, atraumatic, no cyanosis or edema Neurologic: Alert and oriented X 3, normal strength and tone. Normal symmetric reflexes. Normal coordination and gait  ECOG PERFORMANCE STATUS: 0 - Asymptomatic  Blood pressure 116/76, pulse 67, temperature 98.4 F (36.9 C), temperature source Oral, resp. rate 18, height 5\' 7"  (1.702 m), weight 178 lb 1.6 oz (80.786 kg), SpO2 100.00%.  LABORATORY DATA: Lab Results  Component Value Date   WBC 6.6 08/13/2013   HGB 12.5* 08/13/2013   HCT 36.9* 08/13/2013   MCV 100.8* 08/13/2013   PLT 208 08/13/2013      Chemistry      Component Value Date/Time   NA 141 07/16/2013 0835   NA 140 02/03/2012 1345   K 4.0 07/16/2013 0835   K 3.7 02/03/2012 1345   CL 105 02/03/2012 1345   CO2 24 07/16/2013 0835   CO2 26 02/03/2012 1345   BUN 10.4 07/16/2013 0835   BUN 8 02/03/2012 1345   CREATININE 1.2 07/16/2013 0835   CREATININE 1.30 02/03/2012 1345      Component Value Date/Time   CALCIUM 9.3 07/16/2013 0835   CALCIUM 9.1 02/03/2012 1345   ALKPHOS 52 07/16/2013 0835   ALKPHOS 46 08/26/2010 1756   AST 17 07/16/2013 0835    AST 19 08/26/2010 1756   ALT 13 07/16/2013 0835   ALT 23 08/26/2010 1756   BILITOT 0.42 07/16/2013 0835   BILITOT 0.1* 08/26/2010 1756       RADIOGRAPHIC STUDIES: No results found.  ASSESSMENT AND PLAN: This is a very pleasant 58 years old [place] American male recently diagnosed with chronic myeloid leukemia. He is currently on treatment with Gleevec 400 mg by mouth daily and tolerating it fairly well. His CBC today is unremarkable except for persistent mild anemia. I discussed the lab result with the patient today. I recommended for the patient to continue his current treatment with Gleevec 400 mg by mouth daily. I would see him back for  followup visit in 3 months but he would have CBC, comprehensive metabolic panel, LDH on monthly basis.  The patient voices understanding of current disease status and treatment options and is in agreement with the current care plan.  All questions were answered. The patient knows to call the clinic with any problems, questions or concerns. We can certainly see the patient much sooner if necessary.  Disclaimer: This note was dictated with voice recognition software. Similar sounding words can inadvertently be transcribed and may not be corrected upon review.

## 2013-08-16 ENCOUNTER — Telehealth: Payer: Self-pay | Admitting: Internal Medicine

## 2013-08-16 NOTE — Telephone Encounter (Signed)
s/w pt re mthly lab appts. pt was given each d/t for july thru oct. pt aware that 9/3 is lb/MM.

## 2013-08-17 ENCOUNTER — Encounter: Payer: Self-pay | Admitting: Interventional Cardiology

## 2013-08-27 ENCOUNTER — Telehealth: Payer: Self-pay

## 2013-08-27 NOTE — Telephone Encounter (Signed)
Message copied by Lamar Laundry on Thu Aug 27, 2013  8:54 AM ------      Message from: Daneen Schick      Created: Wed Aug 26, 2013  2:05 PM       Labs are stable ------

## 2013-08-27 NOTE — Telephone Encounter (Signed)
Message copied by Lamar Laundry on Thu Aug 27, 2013  9:33 AM ------      Message from: Daneen Schick      Created: Wed Aug 26, 2013  2:05 PM       Labs are stable ------

## 2013-08-27 NOTE — Telephone Encounter (Signed)
pt returned call.pt given lab results.  Labs are stable.pt verbalized understanding.pt sts that he has lodt 25-30lbs and his pcp suggested he might be able to d/c diovan. pt would like to know if Dr.Smith would consider d/c diovan.pt sts that his bp has been well controlled.adv pt that Dr.Smith will return to the office on 09/07/13. I will fwd him the message and call back with his response. Pt agreeable and verbalized understanding

## 2013-08-27 NOTE — Telephone Encounter (Signed)
called to give pt lab results.lmtcb

## 2013-08-28 NOTE — Telephone Encounter (Signed)
Should not chaNGE meds

## 2013-08-31 NOTE — Telephone Encounter (Signed)
pt given Dr.Smith's recommnedations NO medication changes should be made.pt verbalized understanding.

## 2013-09-09 ENCOUNTER — Other Ambulatory Visit (HOSPITAL_BASED_OUTPATIENT_CLINIC_OR_DEPARTMENT_OTHER): Payer: 59

## 2013-09-09 DIAGNOSIS — C9210 Chronic myeloid leukemia, BCR/ABL-positive, not having achieved remission: Secondary | ICD-10-CM

## 2013-09-09 LAB — CBC WITH DIFFERENTIAL/PLATELET
BASO%: 0.4 % (ref 0.0–2.0)
Basophils Absolute: 0 10*3/uL (ref 0.0–0.1)
EOS%: 1.6 % (ref 0.0–7.0)
Eosinophils Absolute: 0.1 10*3/uL (ref 0.0–0.5)
HEMATOCRIT: 34.3 % — AB (ref 38.4–49.9)
HGB: 11.4 g/dL — ABNORMAL LOW (ref 13.0–17.1)
LYMPH%: 32.4 % (ref 14.0–49.0)
MCH: 33.7 pg — AB (ref 27.2–33.4)
MCHC: 33.4 g/dL (ref 32.0–36.0)
MCV: 101 fL — ABNORMAL HIGH (ref 79.3–98.0)
MONO#: 0.6 10*3/uL (ref 0.1–0.9)
MONO%: 8.1 % (ref 0.0–14.0)
NEUT#: 4.2 10*3/uL (ref 1.5–6.5)
NEUT%: 57.5 % (ref 39.0–75.0)
PLATELETS: 215 10*3/uL (ref 140–400)
RBC: 3.39 10*6/uL — ABNORMAL LOW (ref 4.20–5.82)
RDW: 15.2 % — ABNORMAL HIGH (ref 11.0–14.6)
WBC: 7.3 10*3/uL (ref 4.0–10.3)
lymph#: 2.4 10*3/uL (ref 0.9–3.3)

## 2013-09-09 LAB — COMPREHENSIVE METABOLIC PANEL (CC13)
ALK PHOS: 48 U/L (ref 40–150)
ALT: 8 U/L (ref 0–55)
ANION GAP: 7 meq/L (ref 3–11)
AST: 15 U/L (ref 5–34)
Albumin: 3.8 g/dL (ref 3.5–5.0)
BILIRUBIN TOTAL: 0.33 mg/dL (ref 0.20–1.20)
BUN: 11.9 mg/dL (ref 7.0–26.0)
CO2: 26 meq/L (ref 22–29)
CREATININE: 1.2 mg/dL (ref 0.7–1.3)
Calcium: 8.9 mg/dL (ref 8.4–10.4)
Chloride: 110 mEq/L — ABNORMAL HIGH (ref 98–109)
Glucose: 97 mg/dl (ref 70–140)
Potassium: 3.9 mEq/L (ref 3.5–5.1)
SODIUM: 142 meq/L (ref 136–145)
TOTAL PROTEIN: 6.5 g/dL (ref 6.4–8.3)

## 2013-09-09 LAB — LACTATE DEHYDROGENASE (CC13): LDH: 175 U/L (ref 125–245)

## 2013-09-10 ENCOUNTER — Other Ambulatory Visit: Payer: 59

## 2013-09-11 ENCOUNTER — Encounter: Payer: Self-pay | Admitting: *Deleted

## 2013-10-01 ENCOUNTER — Encounter: Payer: Self-pay | Admitting: Pulmonary Disease

## 2013-10-08 ENCOUNTER — Telehealth: Payer: Self-pay | Admitting: Internal Medicine

## 2013-10-08 ENCOUNTER — Other Ambulatory Visit: Payer: 59

## 2013-10-08 NOTE — Telephone Encounter (Signed)
pt called to r/s cx appt...done pt aware of new d.t °

## 2013-10-14 ENCOUNTER — Other Ambulatory Visit (HOSPITAL_BASED_OUTPATIENT_CLINIC_OR_DEPARTMENT_OTHER): Payer: 59

## 2013-10-14 DIAGNOSIS — C9210 Chronic myeloid leukemia, BCR/ABL-positive, not having achieved remission: Secondary | ICD-10-CM

## 2013-10-14 LAB — COMPREHENSIVE METABOLIC PANEL (CC13)
ALT: 8 U/L (ref 0–55)
ANION GAP: 8 meq/L (ref 3–11)
AST: 17 U/L (ref 5–34)
Albumin: 3.8 g/dL (ref 3.5–5.0)
Alkaline Phosphatase: 44 U/L (ref 40–150)
BUN: 13.2 mg/dL (ref 7.0–26.0)
CO2: 25 meq/L (ref 22–29)
CREATININE: 1.1 mg/dL (ref 0.7–1.3)
Calcium: 8.9 mg/dL (ref 8.4–10.4)
Chloride: 110 mEq/L — ABNORMAL HIGH (ref 98–109)
Glucose: 98 mg/dl (ref 70–140)
Potassium: 4 mEq/L (ref 3.5–5.1)
SODIUM: 142 meq/L (ref 136–145)
TOTAL PROTEIN: 6.5 g/dL (ref 6.4–8.3)
Total Bilirubin: 0.28 mg/dL (ref 0.20–1.20)

## 2013-10-14 LAB — CBC WITH DIFFERENTIAL/PLATELET
BASO%: 0.6 % (ref 0.0–2.0)
Basophils Absolute: 0 10*3/uL (ref 0.0–0.1)
EOS ABS: 0.1 10*3/uL (ref 0.0–0.5)
EOS%: 1.7 % (ref 0.0–7.0)
HCT: 36.5 % — ABNORMAL LOW (ref 38.4–49.9)
HGB: 12.1 g/dL — ABNORMAL LOW (ref 13.0–17.1)
LYMPH%: 29.5 % (ref 14.0–49.0)
MCH: 34 pg — ABNORMAL HIGH (ref 27.2–33.4)
MCHC: 33.2 g/dL (ref 32.0–36.0)
MCV: 102.3 fL — ABNORMAL HIGH (ref 79.3–98.0)
MONO#: 0.7 10*3/uL (ref 0.1–0.9)
MONO%: 10.1 % (ref 0.0–14.0)
NEUT#: 3.8 10*3/uL (ref 1.5–6.5)
NEUT%: 58.1 % (ref 39.0–75.0)
Platelets: 199 10*3/uL (ref 140–400)
RBC: 3.56 10*6/uL — ABNORMAL LOW (ref 4.20–5.82)
RDW: 14.7 % — ABNORMAL HIGH (ref 11.0–14.6)
WBC: 6.5 10*3/uL (ref 4.0–10.3)
lymph#: 1.9 10*3/uL (ref 0.9–3.3)

## 2013-10-14 LAB — LACTATE DEHYDROGENASE (CC13): LDH: 178 U/L (ref 125–245)

## 2013-11-02 ENCOUNTER — Other Ambulatory Visit: Payer: Self-pay | Admitting: Interventional Cardiology

## 2013-11-05 ENCOUNTER — Telehealth: Payer: Self-pay | Admitting: Internal Medicine

## 2013-11-05 ENCOUNTER — Ambulatory Visit (HOSPITAL_BASED_OUTPATIENT_CLINIC_OR_DEPARTMENT_OTHER): Payer: 59 | Admitting: Internal Medicine

## 2013-11-05 ENCOUNTER — Other Ambulatory Visit (HOSPITAL_BASED_OUTPATIENT_CLINIC_OR_DEPARTMENT_OTHER): Payer: 59

## 2013-11-05 ENCOUNTER — Encounter: Payer: Self-pay | Admitting: Internal Medicine

## 2013-11-05 VITALS — BP 104/69 | HR 71 | Temp 98.1°F | Resp 19 | Ht 67.0 in | Wt 176.0 lb

## 2013-11-05 DIAGNOSIS — C9210 Chronic myeloid leukemia, BCR/ABL-positive, not having achieved remission: Secondary | ICD-10-CM

## 2013-11-05 LAB — CBC WITH DIFFERENTIAL/PLATELET
BASO%: 0.4 % (ref 0.0–2.0)
Basophils Absolute: 0 10*3/uL (ref 0.0–0.1)
EOS ABS: 0.1 10*3/uL (ref 0.0–0.5)
EOS%: 1.5 % (ref 0.0–7.0)
HCT: 35.2 % — ABNORMAL LOW (ref 38.4–49.9)
HGB: 12.2 g/dL — ABNORMAL LOW (ref 13.0–17.1)
LYMPH#: 1.9 10*3/uL (ref 0.9–3.3)
LYMPH%: 36.1 % (ref 14.0–49.0)
MCH: 34 pg — ABNORMAL HIGH (ref 27.2–33.4)
MCHC: 34.7 g/dL (ref 32.0–36.0)
MCV: 98.1 fL — ABNORMAL HIGH (ref 79.3–98.0)
MONO#: 0.5 10*3/uL (ref 0.1–0.9)
MONO%: 9.9 % (ref 0.0–14.0)
NEUT#: 2.7 10*3/uL (ref 1.5–6.5)
NEUT%: 52.1 % (ref 39.0–75.0)
Platelets: 198 10*3/uL (ref 140–400)
RBC: 3.59 10*6/uL — ABNORMAL LOW (ref 4.20–5.82)
RDW: 14.6 % (ref 11.0–14.6)
WBC: 5.2 10*3/uL (ref 4.0–10.3)

## 2013-11-05 LAB — COMPREHENSIVE METABOLIC PANEL (CC13)
ALBUMIN: 3.7 g/dL (ref 3.5–5.0)
ALT: 14 U/L (ref 0–55)
AST: 17 U/L (ref 5–34)
Alkaline Phosphatase: 43 U/L (ref 40–150)
Anion Gap: 7 mEq/L (ref 3–11)
BUN: 11.5 mg/dL (ref 7.0–26.0)
CALCIUM: 8.6 mg/dL (ref 8.4–10.4)
CHLORIDE: 109 meq/L (ref 98–109)
CO2: 25 mEq/L (ref 22–29)
Creatinine: 1.1 mg/dL (ref 0.7–1.3)
Glucose: 97 mg/dl (ref 70–140)
POTASSIUM: 3.9 meq/L (ref 3.5–5.1)
Sodium: 140 mEq/L (ref 136–145)
Total Bilirubin: 0.31 mg/dL (ref 0.20–1.20)
Total Protein: 6.3 g/dL — ABNORMAL LOW (ref 6.4–8.3)

## 2013-11-05 LAB — LACTATE DEHYDROGENASE (CC13): LDH: 189 U/L (ref 125–245)

## 2013-11-05 NOTE — Progress Notes (Signed)
[facility] Telephone:(336) [phone]   Fax:(336) [phone]  OFFICE PROGRESS NOTE  [name], [address] [address]  DIAGNOSIS:  1) Chronic myeloid leukemia diagnosed in November of 2014. 2) history of prostate adenocarcinoma diagnosed in 2006  PRIOR THERAPY: Status post prostatectomy.  CURRENT THERAPY: Gleevec 400 mg by mouth daily, started 02/10/2013.  INTERVAL HISTORY: [name] 58 y.o. male returns to the clinic today for followup visit. The patient is feeling fine today with no specific complaints except for mild nausea usually in the morning. He did not take any antiemetics which was prescribed by his primary care physician. He denied having any significant weight loss or night sweats. He is tolerating his treatment with Gleevec fairly well. He denied having any significant chest pain, cough or hemoptysis. He has no vomiting or change in bowel movement. The patient denied having any fever or chills. Has no significant weight loss or night sweats. He has repeat CBC performed earlier today and is here for evaluation and discussion of his lab results.  MEDICAL HISTORY: Past Medical History  Diagnosis Date  . Myocardial infarction 02,6/12  . Hypertension   . Arthritis   . Cancer     hx prostate cancer  . Bladder disorder   . Coronary artery disease     with prior MI 2002(BMS) and Cfx(2006) and RCA (2004) stents . EF 45-50%  . Hyperlipidemia   . Coronary atherosclerosis of native coronary artery     stable and suspect a component of CAS  . Diverticulosis     seen on colonoscopy in 2008  . H/O colonoscopy 2014  . Prostate cancer     ALLERGIES:  is allergic to contrast media.  MEDICATIONS:  Current Outpatient Prescriptions  Medication Sig Dispense Refill  . Ascorbic Acid (VITAMIN C) 1000 MG tablet Take 1,000 mg by mouth daily.      Marland Kitchen aspirin 81 MG chewable tablet Chew 162 mg by mouth daily.       Marland Kitchen  atorvastatin (LIPITOR) 10 MG tablet Take 10 mg by mouth daily.      . cholecalciferol (VITAMIN D) 1000 UNITS tablet Take 5,000 Units by mouth daily.       Marland Kitchen darifenacin (ENABLEX) 15 MG 24 hr tablet Take 15 mg by mouth daily.       Marland Kitchen dexlansoprazole (DEXILANT) 60 MG capsule Take 60 mg by mouth daily.      Marland Kitchen GLEEVEC 400 MG tablet Take 1 tablet by mouth  daily with a meal and a  large glass of water  30 tablet  2  . hydrOXYzine (ATARAX/VISTARIL) 25 MG tablet Take 50 mg by mouth daily.      . isosorbide mononitrate (IMDUR) 60 MG 24 hr tablet Take 1 tablet by mouth once a day  90 tablet  1  . methocarbamol (ROBAXIN) 750 MG tablet Take 750 mg by mouth as needed for muscle spasms.      . metoprolol succinate (TOPROL-XL) 100 MG 24 hr tablet Take 1 tablet by mouth once a day  90 tablet  1  . Mirabegron ER (MYRBETRIQ) 25 MG TB24 Take 25 mg by mouth daily.      . nitroGLYCERIN (NITROLINGUAL) 0.4 MG/SPRAY spray Place 1 spray under the tongue every 5 (five) minutes as needed for chest pain. For chest pain  12 g  11  . Nitroglycerin 0.4 % OINT Place 0.125 % rectally 3 (three) times daily.      Marland Kitchen  Omega-3 Fatty Acids (FISH OIL TRIPLE STRENGTH) 1400 MG CAPS Take by mouth.      . valsartan (DIOVAN) 40 MG tablet TAKE 1 TABLET BY MOUTH DAILY  30 tablet  6  . valsartan (DIOVAN) 40 MG tablet Take 1 tablet by mouth  daily  90 tablet  0   No current facility-administered medications for this visit.    SURGICAL HISTORY:  Past Surgical History  Procedure Laterality Date  . Carotid stent    . Tonsillectomy    . Fracture surgery      rt wrist  . Toe surgery      rt great toe  . Wrist surgery      rt and lt cysts removed  . Orif ankle fracture  07/04/2011    Procedure: OPEN REDUCTION INTERNAL FIXATION (ORIF) ANKLE FRACTURE;  Surgeon: Alta Corning, MD;  Location: [facility];  Service: Orthopedics;  Laterality: Right;  . Cardiac catheterization      normal EF, patent stents without obstructive  disease (on Plavix X 1 month only)    REVIEW OF SYSTEMS:  Constitutional: negative Eyes: negative Ears, nose, mouth, throat, and face: negative Respiratory: negative Cardiovascular: negative Gastrointestinal: negative Genitourinary:negative Integument/breast: negative Hematologic/lymphatic: negative Musculoskeletal:negative Neurological: negative Behavioral/Psych: negative Endocrine: negative Allergic/Immunologic: negative   PHYSICAL EXAMINATION: General appearance: alert, cooperative and no distress Head: Normocephalic, without obvious abnormality, atraumatic Neck: no adenopathy, no JVD, supple, symmetrical, trachea midline and thyroid not enlarged, symmetric, no tenderness/mass/nodules Lymph nodes: Cervical, supraclavicular, and axillary nodes normal. Resp: clear to auscultation bilaterally Back: symmetric, no curvature. ROM normal. No CVA tenderness. Cardio: regular rate and rhythm, S1, S2 normal, no murmur, click, rub or gallop GI: soft, non-tender; bowel sounds normal; no masses,  no organomegaly Extremities: extremities normal, atraumatic, no cyanosis or edema Neurologic: Alert and oriented X 3, normal strength and tone. Normal symmetric reflexes. Normal coordination and gait  ECOG PERFORMANCE STATUS: 0 - Asymptomatic  Blood pressure 104/69, pulse 71, temperature 98.1 F (36.7 C), temperature source Oral, resp. rate 19, height 5\' 7"  (1.702 m), weight 176 lb (79.833 kg).  LABORATORY DATA: Lab Results  Component Value Date   WBC 5.2 11/05/2013   HGB 12.2* 11/05/2013   HCT 35.2* 11/05/2013   MCV 98.1* 11/05/2013   PLT 198 11/05/2013      Chemistry      Component Value Date/Time   NA 142 10/14/2013 0812   NA 140 02/03/2012 1345   K 4.0 10/14/2013 0812   K 3.7 02/03/2012 1345   CL 105 02/03/2012 1345   CO2 25 10/14/2013 0812   CO2 26 02/03/2012 1345   BUN 13.2 10/14/2013 0812   BUN 8 02/03/2012 1345   CREATININE 1.1 10/14/2013 0812   CREATININE 1.30 02/03/2012 1345        Component Value Date/Time   CALCIUM 8.9 10/14/2013 0812   CALCIUM 9.1 02/03/2012 1345   ALKPHOS 44 10/14/2013 0812   ALKPHOS 46 08/26/2010 1756   AST 17 10/14/2013 0812   AST 19 08/26/2010 1756   ALT 8 10/14/2013 0812   ALT 23 08/26/2010 1756   BILITOT 0.28 10/14/2013 0812   BILITOT 0.1* 08/26/2010 1756       RADIOGRAPHIC STUDIES: No results found.  ASSESSMENT AND PLAN: This is a very pleasant 58 years old [place] American male recently diagnosed with chronic myeloid leukemia. He is currently on treatment with Gleevec 400 mg by mouth daily and tolerating it fairly well. His CBC today is stable and unremarkable  except for persistent mild anemia. I discussed the lab result with the patient today. I recommended for the patient to continue his current treatment with Gleevec 400 mg by mouth daily.  I would see him back for followup visit in 3 months with repeat CBC, comprehensive metabolic panel, LDH as well as molecular study for BCR/ABL before his next visit. Further nausea, I advise him to take the prescribed antiemetic.  The patient voices understanding of current disease status and treatment options and is in agreement with the current care plan.  All questions were answered. The patient knows to call the clinic with any problems, questions or concerns. We can certainly see the patient much sooner if necessary.  Disclaimer: This note was dictated with voice recognition software. Similar sounding words can inadvertently be transcribed and may not be corrected upon review.

## 2013-11-05 NOTE — Telephone Encounter (Signed)
gv adn printed appt sched and avs for pt for Dec °

## 2013-11-30 ENCOUNTER — Other Ambulatory Visit: Payer: Self-pay | Admitting: Internal Medicine

## 2013-12-03 ENCOUNTER — Other Ambulatory Visit: Payer: 59

## 2013-12-13 ENCOUNTER — Encounter (HOSPITAL_COMMUNITY): Payer: Self-pay | Admitting: Emergency Medicine

## 2013-12-13 ENCOUNTER — Emergency Department (HOSPITAL_COMMUNITY)
Admission: EM | Admit: 2013-12-13 | Discharge: 2013-12-13 | Disposition: A | Discharge status: Home / Self Care | Payer: 59 | Attending: Emergency Medicine | Admitting: Emergency Medicine

## 2013-12-13 ENCOUNTER — Emergency Department (HOSPITAL_COMMUNITY): Payer: 59

## 2013-12-13 DIAGNOSIS — Z8719 Personal history of other diseases of the digestive system: Secondary | ICD-10-CM | POA: Diagnosis not present

## 2013-12-13 DIAGNOSIS — Z87891 Personal history of nicotine dependence: Secondary | ICD-10-CM | POA: Insufficient documentation

## 2013-12-13 DIAGNOSIS — M1990 Unspecified osteoarthritis, unspecified site: Secondary | ICD-10-CM | POA: Insufficient documentation

## 2013-12-13 DIAGNOSIS — I2510 Atherosclerotic heart disease of native coronary artery without angina pectoris: Secondary | ICD-10-CM | POA: Insufficient documentation

## 2013-12-13 DIAGNOSIS — Z9889 Other specified postprocedural states: Secondary | ICD-10-CM | POA: Diagnosis not present

## 2013-12-13 DIAGNOSIS — I2583 Coronary atherosclerosis due to lipid rich plaque: Secondary | ICD-10-CM

## 2013-12-13 DIAGNOSIS — R0789 Other chest pain: Secondary | ICD-10-CM | POA: Insufficient documentation

## 2013-12-13 DIAGNOSIS — E785 Hyperlipidemia, unspecified: Secondary | ICD-10-CM | POA: Diagnosis not present

## 2013-12-13 DIAGNOSIS — I10 Essential (primary) hypertension: Secondary | ICD-10-CM | POA: Insufficient documentation

## 2013-12-13 DIAGNOSIS — Z7982 Long term (current) use of aspirin: Secondary | ICD-10-CM | POA: Diagnosis not present

## 2013-12-13 DIAGNOSIS — Z79899 Other long term (current) drug therapy: Secondary | ICD-10-CM | POA: Insufficient documentation

## 2013-12-13 DIAGNOSIS — I252 Old myocardial infarction: Secondary | ICD-10-CM | POA: Diagnosis not present

## 2013-12-13 DIAGNOSIS — R079 Chest pain, unspecified: Secondary | ICD-10-CM

## 2013-12-13 DIAGNOSIS — I209 Angina pectoris, unspecified: Secondary | ICD-10-CM

## 2013-12-13 DIAGNOSIS — Z87448 Personal history of other diseases of urinary system: Secondary | ICD-10-CM | POA: Insufficient documentation

## 2013-12-13 DIAGNOSIS — Z8546 Personal history of malignant neoplasm of prostate: Secondary | ICD-10-CM | POA: Diagnosis not present

## 2013-12-13 LAB — COMPREHENSIVE METABOLIC PANEL
ALBUMIN: 3.7 g/dL (ref 3.5–5.2)
ALK PHOS: 43 U/L (ref 39–117)
ALT: 12 U/L (ref 0–53)
AST: 19 U/L (ref 0–37)
Anion gap: 9 (ref 5–15)
BUN: 12 mg/dL (ref 6–23)
CO2: 26 mEq/L (ref 19–32)
Calcium: 8.6 mg/dL (ref 8.4–10.5)
Chloride: 105 mEq/L (ref 96–112)
Creatinine, Ser: 1.16 mg/dL (ref 0.50–1.35)
GFR calc non Af Amer: 69 mL/min — ABNORMAL LOW (ref >90)
GFR, EST AFRICAN AMERICAN: 80 mL/min — AB (ref >90)
Glucose, Bld: 94 mg/dL (ref 70–99)
POTASSIUM: 4.3 meq/L (ref 3.7–5.3)
SODIUM: 140 meq/L (ref 137–147)
TOTAL PROTEIN: 6.5 g/dL (ref 6.0–8.3)
Total Bilirubin: <0.2 mg/dL — ABNORMAL LOW (ref 0.3–1.2)

## 2013-12-13 LAB — CBC WITH DIFFERENTIAL/PLATELET
BASOS ABS: 0 10*3/uL (ref 0.0–0.1)
Basophils Relative: 1 % (ref 0–1)
EOS ABS: 0.1 10*3/uL (ref 0.0–0.7)
Eosinophils Relative: 2 % (ref 0–5)
HCT: 33.1 % — ABNORMAL LOW (ref 39.0–52.0)
Hemoglobin: 11.6 g/dL — ABNORMAL LOW (ref 13.0–17.0)
LYMPHS ABS: 1.5 10*3/uL (ref 0.7–4.0)
Lymphocytes Relative: 24 % (ref 12–46)
MCH: 33.5 pg (ref 26.0–34.0)
MCHC: 35 g/dL (ref 30.0–36.0)
MCV: 95.7 fL (ref 78.0–100.0)
Monocytes Absolute: 0.6 10*3/uL (ref 0.1–1.0)
Monocytes Relative: 10 % (ref 3–12)
NEUTROS PCT: 63 % (ref 43–77)
Neutro Abs: 4 10*3/uL (ref 1.7–7.7)
PLATELETS: 212 10*3/uL (ref 150–400)
RBC: 3.46 MIL/uL — AB (ref 4.22–5.81)
RDW: 14.4 % (ref 11.5–15.5)
WBC: 6.2 10*3/uL (ref 4.0–10.5)

## 2013-12-13 LAB — TROPONIN I
Troponin I: <0.3 ng/mL (ref <0.30)
Troponin I: <0.3 ng/mL (ref <0.30)

## 2013-12-13 MED ORDER — ONDANSETRON 4 MG PO TBDP
4.0000 mg | ORAL_TABLET | Freq: Once | ORAL | Status: AC
  Administered 2013-12-13: 4 mg via ORAL
  Filled 2013-12-13: qty 1

## 2013-12-13 MED ORDER — ASPIRIN 81 MG PO CHEW: 324.0000 mg | CHEWABLE_TABLET | Freq: Once | ORAL | Status: DC

## 2013-12-13 MED ORDER — METHYLPREDNISOLONE SODIUM SUCC 125 MG IJ SOLR
125.0000 mg | Freq: Once | INTRAMUSCULAR | Status: AC
  Administered 2013-12-13: 125 mg via INTRAVENOUS
  Filled 2013-12-13: qty 2

## 2013-12-13 MED ORDER — DIPHENHYDRAMINE HCL 50 MG/ML IJ SOLN
50.0000 mg | Freq: Once | INTRAMUSCULAR | Status: AC
  Administered 2013-12-13: 50 mg via INTRAVENOUS
  Filled 2013-12-13: qty 1

## 2013-12-13 MED ORDER — NITROGLYCERIN 0.4 MG SL SUBL
0.4000 mg | SUBLINGUAL_TABLET | SUBLINGUAL | Status: DC | PRN
  Administered 2013-12-13 (×3): 0.4 mg via SUBLINGUAL
  Filled 2013-12-13: qty 1

## 2013-12-13 NOTE — Discharge Instructions (Signed)

## 2013-12-13 NOTE — ED Provider Notes (Addendum)
CSN: [id number]     Arrival date & time 12/13/13  0940 History   First MD Initiated Contact with Patient 12/13/13 0957     Chief Complaint  Patient presents with  . Chest Pain     (Consider location/radiation/quality/duration/timing/severity/associated sxs/prior Treatment) HPI Comments: Patient presents to the ER for evaluation of chest pain. Patient reports left-sided chest pain that started approximately 1-1/2 hours ago. Patient reports that he was getting ready for work at the time, not doing anything strenuous. He denies any injury to the area. Pain is a heavy pressure on the left side which has been constant, moderate. He reports it feels similar to when he had a heart attack. There is no shortness of breath, nausea, diaphoresis. He has not identified any alleviating or exacerbating factors, but reports it has been progressively worsening since it started.  Patient is a 58 y.o. male presenting with chest pain.  Chest Pain   Past Medical History  Diagnosis Date  . Myocardial infarction 02,6/12  . Hypertension   . Arthritis   . Cancer     hx prostate cancer  . Bladder disorder   . Coronary artery disease     with prior MI 2002([facility]) and Cfx(2006) and RCA (2004) stents . EF 45-50%  . Hyperlipidemia   . Coronary atherosclerosis of native coronary artery     stable and suspect a component of CAS  . Diverticulosis     seen on colonoscopy in 2008  . H/O colonoscopy 2014  . Prostate cancer    Past Surgical History  Procedure Laterality Date  . Carotid stent    . Tonsillectomy    . Fracture surgery      rt wrist  . Toe surgery      rt great toe  . Wrist surgery      rt and lt cysts removed  . Orif ankle fracture  07/04/2011    Procedure: OPEN REDUCTION INTERNAL FIXATION (ORIF) ANKLE FRACTURE;  Surgeon: Alta Corning, MD;  Location: [facility];  Service: Orthopedics;  Laterality: Right;  . Cardiac catheterization      normal EF, patent stents without obstructive  disease (on Plavix X 1 month only)   Family History  Problem Relation Age of Onset  . Cancer Mother     Liver   History  Substance Use Topics  . Smoking status: Former Smoker    Types: Cigarettes    Quit date: 06/06/2000  . Smokeless tobacco: Never Used     Comment: 2002  . Alcohol Use: 1.2 oz/week    2 Cans of beer per week     Comment: rare    Review of Systems  Cardiovascular: Positive for chest pain.  All other systems reviewed and are negative.     Allergies  Contrast media  Home Medications   Prior to Admission medications   Medication Sig Start Date End Date Taking? Authorizing Provider  Ascorbic Acid (VITAMIN C) 1000 MG tablet Take 1,000 mg by mouth daily.    Historical Provider, MD  aspirin 81 MG chewable tablet Chew 162 mg by mouth daily.     Historical Provider, MD  atorvastatin (LIPITOR) 10 MG tablet Take 10 mg by mouth daily.    Historical Provider, MD  cholecalciferol (VITAMIN D) 1000 UNITS tablet Take 5,000 Units by mouth daily.     Historical Provider, MD  darifenacin (ENABLEX) 15 MG 24 hr tablet Take 15 mg by mouth daily.     Historical Provider, MD  dexlansoprazole (DEXILANT) 60 MG capsule Take 60 mg by mouth daily.    Historical Provider, MD  GLEEVEC 400 MG tablet Take 1 tablet by mouth  daily with a meal and a  large glass of water 11/30/13   Curt Bears, MD  hydrOXYzine (ATARAX/VISTARIL) 25 MG tablet Take 50 mg by mouth daily.    Historical Provider, MD  isosorbide mononitrate (IMDUR) 60 MG 24 hr tablet Take 1 tablet by mouth once a day    Belva Crome III, MD  methocarbamol (ROBAXIN) 750 MG tablet Take 750 mg by mouth as needed for muscle spasms.    Historical Provider, MD  metoprolol succinate (TOPROL-XL) 100 MG 24 hr tablet Take 1 tablet by mouth once a day    Belva Crome III, MD  Mirabegron ER (MYRBETRIQ) 25 MG TB24 Take 25 mg by mouth daily.    Historical Provider, MD  nitroGLYCERIN (NITROLINGUAL) 0.4 MG/SPRAY spray Place 1 spray under the  tongue every 5 (five) minutes as needed for chest pain. For chest pain 02/04/12   Belva Crome III, MD  Nitroglycerin 0.4 % OINT Place 0.125 % rectally 3 (three) times daily.    Historical Provider, MD  Omega-3 Fatty Acids (FISH OIL TRIPLE STRENGTH) 1400 MG CAPS Take by mouth.    Historical Provider, MD  valsartan (DIOVAN) 40 MG tablet TAKE 1 TABLET BY MOUTH DAILY 04/16/13   Belva Crome III, MD   BP 112/70  Pulse 74  Temp(Src) 98.2 F (36.8 C) (Oral)  Resp 20  Ht 5\' 7"  (1.702 m)  Wt 175 lb (79.379 kg)  BMI 27.40 kg/m2  SpO2 100% Physical Exam  Constitutional: He is oriented to person, place, and time. He appears well-developed and well-nourished. No distress.  HENT:  Head: Normocephalic and atraumatic.  Right Ear: Hearing normal.  Left Ear: Hearing normal.  Nose: Nose normal.  Mouth/Throat: Oropharynx is clear and moist and mucous membranes are normal.  Eyes: Conjunctivae and EOM are normal. Pupils are equal, round, and reactive to light.  Neck: Normal range of motion. Neck supple.  Cardiovascular: Regular rhythm, S1 normal and S2 normal.  Exam reveals no gallop and no friction rub.   No murmur heard. Pulmonary/Chest: Effort normal and breath sounds normal. No respiratory distress. He exhibits no tenderness.  Abdominal: Soft. Normal appearance and bowel sounds are normal. There is no hepatosplenomegaly. There is no tenderness. There is no rebound, no guarding, no tenderness at McBurney's point and negative Murphy's sign. No hernia.  Musculoskeletal: Normal range of motion.  Neurological: He is alert and oriented to person, place, and time. He has normal strength. No cranial nerve deficit or sensory deficit. Coordination normal. GCS eye subscore is 4. GCS verbal subscore is 5. GCS motor subscore is 6.  Skin: Skin is warm, dry and intact. No rash noted. No cyanosis.  Psychiatric: He has a normal mood and affect. His speech is normal and behavior is normal. Thought content normal.     ED Course  Procedures (including critical care time) Labs Review Labs Reviewed  CBC WITH DIFFERENTIAL  COMPREHENSIVE METABOLIC PANEL  TROPONIN I    Imaging Review No results found.   EKG Interpretation   Date/Time:  Sunday December 13 2013 09:45:23 EDT Ventricular Rate:  72 PR Interval:  144 QRS Duration: 88 QT Interval:  394 QTC Calculation: 431 R Axis:   48 Text Interpretation:  Normal sinus rhythm Nonspecific T wave abnormality  Abnormal ECG No significant change since last tracing Confirmed  by Betsey Holiday   MD, [place] [phone]) on 12/13/2013 9:57:33 AM      MDM   Final diagnoses:  Chest pain   Patient with previous history of coronary artery disease, status post stenting of ramus intermedius and RCA presents with complaints of chest pain. Pain is in the left side of his chest and felt similar to when he had his last cardiac event. Reviewing the records reveals that he did have an acute coronary syndrome with elevated troponins in 2013. Cardiac catheterization at that time revealed patent stents with less than 50% blockages.  Patient did not have improvement with nitroglycerin here in the ER. He is now noting his back hitting his left chest. This raises consideration for possible aortic dissection. Patient has had a contrast media allergy. Case discussed briefly with Dr. Harrington Challenger, Cardiology. Recommends administering IV Solu-Medrol to help facilitate possible IV dye for CT and/or cardiac catheterization, will see patient.  Addendum: Cardiology has seen and evaluated the patient. It was recommended that the patient could have an additional troponin, discharged to followup in the office for stress test if negative. Second troponin was negative, patient discharged to home.  Orpah Greek, MD 12/13/13 [place], MD 12/13/13 [phone]

## 2013-12-13 NOTE — ED Notes (Signed)
Pt reports pain is more toward his back since the second nitroglycerin was given

## 2013-12-13 NOTE — H&P (Signed)
CARDIOLOGY HISTORY AND PHYSICAL   Patient ID: [name] MRN: [id number]  DOB/AGE: [birth year] 58 y.o. Admit date: 12/13/2013  Primary Care Physician: [name], MD Primary Cardiologist: Daneen Schick III  Clinical Summary Mr. Merriott is a 58 y.o.male history of coronary disease. In 2002 had a myocardial infarction and underwent stenting 2 and OM. In 2006 he had stenting of his right coronary artery. Most recent cardiac cath in December of 2013 demonstrating widely patent stent to the Cx, Mid RCA, and two overlapping stents to the prox and ostial ramus intermedius. Diagnosed with coronary spasm. Other history include hypertension,artrities, and hyperlipidemia.     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 Did lift a heavy body yesterday. This am after waking, he was defecating and had chest pain on the left side of his chest, radiating to the axillary area and to his left shoulder blade. He used NTG spray X 2 and did not find relief. He denies dyspnea or diaphoresis, which he experienced during Mi in 2002. Due to ongoing pain, he came to ER to be evaluated.    He denies medical non-compliance. He is also followed by Dr. Earlie Server for leukemia. He continues to feel chest discomfort, although not as severe, but has not had complete relief.    Initial troponin is negative, CXR is unremarkable. EKG NSR with T-wave flattening infero./lateral with flattening slightly more prominent compared to previous EKG in 01/2013.  Allergies  Allergen Reactions  . Contrast Media [Iodinated Diagnostic Agents] Other (See Comments)    Reaction unknown    Home Medications  (Not in a hospital admission)  Scheduled Medications . aspirin  324 mg Oral Once    Infusions    PRN Medications nitroGLYCERIN  Past Medical History  Diagnosis Date  . Myocardial infarction 02,6/12  . Hypertension   . Arthritis   . Cancer       hx prostate cancer  . Bladder disorder   . Coronary artery disease     with prior MI 2002([facility]) and Cfx(2006) and RCA (2004) stents . EF 45-50%  . Hyperlipidemia   . Coronary atherosclerosis of native coronary artery     stable and suspect a component of CAS  . Diverticulosis     seen on colonoscopy in 2008  . H/O colonoscopy 2014  . Prostate cancer     Past Surgical History  Procedure Laterality Date  . Carotid stent    . Tonsillectomy    . Fracture surgery      rt wrist  . Toe surgery      rt great toe  . Wrist surgery      rt and lt cysts removed  . Orif ankle fracture  07/04/2011    Procedure: OPEN REDUCTION INTERNAL FIXATION (ORIF) ANKLE FRACTURE;  Surgeon: Alta Corning, MD;  Location: [facility];  Service: Orthopedics;  Laterality: Right;  . Cardiac catheterization      normal EF, patent stents without obstructive disease (on Plavix X 1 month only)    Family History  Problem Relation Age of Onset  . Cancer Mother     Liver    Social History Mr. Polasek reports that he quit smoking about 13 years ago. His smoking use included Cigarettes. He smoked 0.00 packs per day. He has never used smokeless tobacco. Mr. Bless reports that he drinks about 1.2 ounces of alcohol per week.  Review of Systems Otherwise reviewed and negative except as outlined.  Physical Examination Temp:  [98.2 F (36.8 C)] 98.2 F (36.8 C) (10/11 0953) Pulse Rate:  [64-75] 75 (10/11 1330) Resp:  [11-20] 20 (10/11 1330) BP: (107-128)/(70-85) 128/85 mmHg (10/11 1330) SpO2:  [98 %-100 %] 100 % (10/11 1330) Weight:  [175 lb (79.379 kg)] 175 lb (79.379 kg) (10/11 0953) No intake or output data in the 24 hours ending 12/13/13 1404  Gen: No acute distress. HEENT: Conjunctiva and lids normal, oropharynx clear with moist mucosa. Neck: Supple, no elevated JVP or carotid bruits, no thyromegaly. Lungs: Clear to auscultation, nonlabored breathing at rest. Cardiac: Regular rate and  rhythm, no S3 or significant systolic murmur, no pericardial rub. Abdomen: Soft, nontender, no hepatomegaly, bowel sounds present, no guarding or rebound. Extremities: No pitting edema, distal pulses 2+. Skin: Warm and dry. Musculoskeletal: No kyphosis.Some soreness which is reproducible with palpation of the left chest.  Neuropsychiatric: Alert and oriented x3, affect grossly appropriate.   Lab Results  Basic Metabolic Panel:  Recent Labs Lab 12/13/13 1045  NA 140  K 4.3  CL 105  CO2 26  GLUCOSE 94  BUN 12  CREATININE 1.16  CALCIUM 8.6    Liver Function Tests:  Recent Labs Lab 12/13/13 1045  AST 19  ALT 12  ALKPHOS 43  BILITOT <0.2*  PROT 6.5  ALBUMIN 3.7    CBC:  Recent Labs Lab 12/13/13 1045  WBC 6.2  NEUTROABS 4.0  HGB 11.6*  HCT 33.1*  MCV 95.7  PLT 212    Cardiac Enzymes:  Recent Labs Lab 12/13/13 [place] <0.30    Radiology Dg Chest Port 1 View  12/13/2013   CLINICAL DATA:  3 hr history of chest pain; hypertension  EXAM: PORTABLE CHEST - 1 VIEW  COMPARISON:  February 03, 2012  FINDINGS: Lungs are clear. Heart size and pulmonary vascularity are normal. No adenopathy. No pneumothorax. No bone lesions.  IMPRESSION: No edema or consolidation.   Electronically Signed   By: Lowella Grip M.D.   On: 12/13/2013 11:43    Prior Cardiac Testing/Procedures:  1.Cardiac Cath 02/04/2012 LEFT VENTRICULOGRAM: Left ventricular angiogram was done in the 30 RAO projection and revealed normal left ventricular wall motion and systolic function with an estimated ejection fraction of 50%.  IMPRESSIONS: 1. Acute coronary syndrome likely related to coronary artery spasm and/or transient thrombotic event. No high grade obstructive disease is noted.  2. Widely patent ramus intermedius stents. Widely patent mid right coronary stent.  3. Low normal left ventricular function  RECOMMENDATION: 1. Long-acting nitrates will be continued and plavix added for 1  month.    ECG NSR with T-wave abnormalities infero/lateral.   Impression and Recommendations 1.Chest Pain: Typical and atypical features. Pain is constant with little relief with nitrates taken at home and given in ER. He has some pain which is reproducible on palpation. EKG in unchanged from previous EKG .Intial troponin is negative. He did some heavy lifting yesterday which may have caused some musculoskeletal strain. He also initially felt the pain as he was defecating this am.   2. CAD: Hx of stents to CX, Mid RCA and Ramus, Most recent cath in 2013 widely patent stents. Coronary spasm noted. He is medically compliant. Would continue ASA, BB, statin and isosorbide. Consider OP stress test if he rules out.   3. Hypertension: Currently well controlled. On valsartan at home along with long acting nitrates.   4. Hx of Leukemia: Followed by  oncology.    Signed: Phill Myron. Lawrence NP  12/13/2013, 2:04 PM Co-Sign MD  Patient seen with NP, agree with the above note.  He did some heavy lifting yesterday and since this morning has had far left lateral chest pain that is worse with a deep breath.  There is some tenderness to palpation in the area where the pain is.  ECG shows nonspecific changes.  He has had pain since 7 am, troponin negative x 1.  If repeat troponin is negative, think he can be safely sent home with close followup. He will need ETT-Cardiolite as outpatient.   Loralie Champagne 12/13/2013 2:55 PM

## 2013-12-13 NOTE — ED Notes (Signed)
Pt. Stated, i started having chest pain this morning on the left side.  No other symptoms.

## 2013-12-14 ENCOUNTER — Telehealth: Payer: Self-pay | Admitting: Interventional Cardiology

## 2013-12-14 DIAGNOSIS — R079 Chest pain, unspecified: Secondary | ICD-10-CM

## 2013-12-14 NOTE — Telephone Encounter (Signed)
New Prob    Pt was seen in ED yesterday. Requesting to speak to a nurse regarding follow up and possible stress test. Please call.

## 2013-12-14 NOTE — Telephone Encounter (Signed)
Returned pt call. Pt was seed at [facility] ED on 10/11. It was recommended by Dr. Aundra Dubin that pt have a ett myoview. Adv pt that a scheduler from our office will call him to schedule. Pt given verbal pretest instructions. Adv pt results will be reviewed by Dr.Smith. Pt adv to keep appt with Dr.Smith on 11/23, unless myoview is abnormal I will call him with results. Pt agreeable and verbalized understanding

## 2013-12-23 ENCOUNTER — Ambulatory Visit (HOSPITAL_COMMUNITY): Payer: 59 | Attending: Cardiology | Admitting: Radiology

## 2013-12-23 VITALS — BP 125/92 | HR 62 | Ht 67.0 in | Wt 175.0 lb

## 2013-12-23 DIAGNOSIS — I10 Essential (primary) hypertension: Secondary | ICD-10-CM | POA: Diagnosis not present

## 2013-12-23 DIAGNOSIS — R079 Chest pain, unspecified: Secondary | ICD-10-CM | POA: Diagnosis present

## 2013-12-23 DIAGNOSIS — I2510 Atherosclerotic heart disease of native coronary artery without angina pectoris: Secondary | ICD-10-CM | POA: Insufficient documentation

## 2013-12-23 MED ORDER — TECHNETIUM TC 99M SESTAMIBI GENERIC - CARDIOLITE
30.0000 | Freq: Once | INTRAVENOUS | Status: AC | PRN
  Administered 2013-12-23: 30 via INTRAVENOUS

## 2013-12-23 MED ORDER — TECHNETIUM TC 99M SESTAMIBI GENERIC - CARDIOLITE
10.0000 | Freq: Once | INTRAVENOUS | Status: AC | PRN
  Administered 2013-12-23: 10 via INTRAVENOUS

## 2013-12-23 NOTE — Progress Notes (Signed)
[facility] NUCLEAR [facility] [address] [place], [place] [address] [phone]    Cardiology Nuclear Med Study  [name] is a 58 y.o. male     MRN : [id number]     DOB: 09/02/1955  Procedure Date: 12/23/2013  Nuclear Med Background Indication for Stress Test:  Evaluation for Ischemia, Stent Patency, Abnormal EKG, and 12-13-2013 ED Chest Pain, negative enzymes History:  CAD, MPI 2009 (ischemia) EF 49% Cardiac Risk Factors: Hypertension  Symptoms:  Chest Pain   Nuclear Pre-Procedure Caffeine/Decaff Intake:  None> 12 hrs NPO After: 9:00pm   Lungs:  clear O2 Sat: 96% on room air. IV 0.9% NS with Angio Cath:  22g  IV Site: R Antecubital x 1, tolerated well IV Started by:  Irven Baltimore, RN  Chest Size (in):  42 Cup Size: n/a  Height: 5\' 7"  (1.702 m)  Weight:  175 lb (79.379 kg)  BMI:  Body mass index is 27.4 kg/(m^2). Tech Comments:  Patient held Toprol, and Diovan 24 hrs. Irven Baltimore, RN    Nuclear Med Study 1 or 2 day study: 1 day  Stress Test Type:  Stress  Reading MD: N/A  Order Authorizing Provider:  Daneen Schick, III, MD  Resting Radionuclide: Technetium 12m Sestamibi  Resting Radionuclide Dose: 11.0 mCi   Stress Radionuclide:  Technetium 58m Sestamibi  Stress Radionuclide Dose: 33.0 mCi           Stress Protocol Rest HR: 62 Stress HR: 146  Rest BP: 125/92 Stress BP: 168/75  Exercise Time (min): 9:00 METS: 10.1           Dose of Adenosine (mg):  n/a Dose of Lexiscan: n/a mg  Dose of Atropine (mg): n/a Dose of Dobutamine: n/a mcg/kg/min (at max HR)  Stress Test Technologist: Glade Lloyd, BS-ES  Nuclear Technologist:  Margie Ege     Rest Procedure:  Myocardial perfusion imaging was performed at rest 45 minutes following the intravenous administration of Technetium 32m Sestamibi. Rest ECG: Normal sinus rhythm. Diffuse nonspecific ST-T wave changes.  Stress Procedure:  The patient exercised on the treadmill utilizing the Bruce  Protocol for 9:00 minutes. The patient stopped due to legs burning and denied any chest pain.  Technetium 82m Sestamibi was injected at peak exercise and myocardial perfusion imaging was performed after a brief delay. Stress ECG: No significant change from baseline ECG  QPS Raw Data Images:  Normal; no motion artifact; normal heart/lung ratio. Stress Images:  There is a large area of moderate decreased uptake affecting the base/mid-inferolateral segments, base/mid-anterolateral segments, and the apical lateral segment. There is no reversibility. Rest Images:  The rest images show the same areas of decreased uptake as the stress images. Subtraction (SDS):  No evidence of ischemia. Transient Ischemic Dilatation (Normal <1.22):  0.90 Lung/Heart Ratio (Normal <0.45):  0.29  Quantitative Gated Spect Images QGS EDV:  136 ml QGS ESV:  72 ml  Impression Exercise Capacity:  There is fairly good exercise tolerance. BP Response:  Normal blood pressure response. Clinical Symptoms:  There was leg burning. There is no chest pain or shortness of breath ECG Impression:  No significant ST segment change suggestive of ischemia. Comparison with Prior Nuclear Study: No images to compare  Overall Impression:  This study is abnormal. There is a large area of mild/moderate scar affecting the inferolateral wall and the anterolateral wall. There is no significant ischemia. This is a low/moderate  risk scan.  LV Ejection Fraction: 47%.  LV Wall  Motion:  Mild lateral hypokinesis.  Dola Argyle, MD

## 2013-12-24 ENCOUNTER — Telehealth: Payer: Self-pay

## 2013-12-24 NOTE — Telephone Encounter (Signed)
Follow up ° °Pt returned call  °

## 2013-12-24 NOTE — Telephone Encounter (Signed)
called to give pt myoview results.lmtcb

## 2013-12-24 NOTE — Telephone Encounter (Signed)
Message copied by Lamar Laundry on Thu Dec 24, 2013 10:40 AM ------      Message from: Daneen Schick      Created: Wed Dec 23, 2013  5:01 PM       Low to moderate risk study due to prior MI. No ischemia. If feeling okay, no specific action needed. ------

## 2013-12-25 MED ORDER — NITROGLYCERIN 0.4 MG/SPRAY TL SOLN: 1.0000 | Status: DC | PRN

## 2013-12-25 NOTE — Telephone Encounter (Signed)
Follow up  ° ° ° °Returning call back to nurse  °

## 2013-12-25 NOTE — Telephone Encounter (Signed)
returned pt call. lmtcb 

## 2013-12-25 NOTE — Telephone Encounter (Signed)
Message copied by Lamar Laundry on Fri Dec 25, 2013 11:10 AM ------      Message from: Daneen Schick      Created: Wed Dec 23, 2013  5:01 PM       Low to moderate risk study due to prior MI. No ischemia. If feeling okay, no specific action needed. ------

## 2013-12-25 NOTE — Telephone Encounter (Signed)
Message copied by Lamar Laundry on Fri Dec 25, 2013 11:04 AM ------      Message from: Daneen Schick      Created: Wed Dec 23, 2013  5:01 PM       Low to moderate risk study due to prior MI. No ischemia. If feeling okay, no specific action needed. ------

## 2013-12-25 NOTE — Telephone Encounter (Signed)
Pt aware of myoview results.Low to moderate risk study due to prior MI. No ischemia. If feeling okay, no specific action needed.pt sts that he is doing ok. Pt rqst refill for [employer].rx sent to pt pharmacy. Pt verbalized understanding.

## 2014-01-04 ENCOUNTER — Other Ambulatory Visit: Payer: Self-pay | Admitting: Interventional Cardiology

## 2014-01-25 ENCOUNTER — Ambulatory Visit (INDEPENDENT_AMBULATORY_CARE_PROVIDER_SITE_OTHER): Payer: 59 | Admitting: Interventional Cardiology

## 2014-01-25 ENCOUNTER — Encounter: Payer: Self-pay | Admitting: Interventional Cardiology

## 2014-01-25 VITALS — BP 98/70 | HR 80 | Ht 67.0 in | Wt 177.2 lb

## 2014-01-25 DIAGNOSIS — I10 Essential (primary) hypertension: Secondary | ICD-10-CM

## 2014-01-25 DIAGNOSIS — I2510 Atherosclerotic heart disease of native coronary artery without angina pectoris: Secondary | ICD-10-CM

## 2014-01-25 DIAGNOSIS — E785 Hyperlipidemia, unspecified: Secondary | ICD-10-CM

## 2014-01-25 NOTE — Progress Notes (Signed)
Patient ID: [name], male   DOB: Apr 19, 1955, 58 y.o.   MRN: [id number]    [address] [place], [place]  [address] Phone: [phone] Fax:  [phone]  Date:  01/25/2014   ID:  [name], DOB Jan 20, 1956, MRN [id number]  PCP:  [name], MD   ASSESSMENT:  1. Coronary artery disease with recent emergency room visit for left chest and flank pain, myocardial infarction ruled out. Recent myocardial stress perfusion study low risk. 2. Hypertension, controlled 3. Hyperlipidemia on therapy 4. CML on Gleevec  PLAN:  1. Maintain an active lifestyle 2. No cardiac evaluation at this time 3. Low fat diet   SUBJECTIVE: [name] is a 58 y.o. male who is doing well. 7 side effects from [place] for CML. He denies angina. Recent hospital stay for chest discomfort was felt not to be cardiac. He wonders if that was related to a side effect from [place]. He denies syncope, palpitations, edema, and transient neurological symptoms. He has not needed nitroglycerin since the emergency room visit.   Wt Readings from Last 3 Encounters:  01/25/14 177 lb 3.2 oz (80.377 kg)  12/23/13 175 lb (79.379 kg)  12/13/13 175 lb (79.379 kg)     Past Medical History  Diagnosis Date  . Myocardial infarction 02,6/12  . Hypertension   . Arthritis   . Cancer     hx prostate cancer  . Bladder disorder   . Coronary artery disease     with prior MI 2002(BMS) and Cfx(2006) and RCA (2004) stents . EF 45-50%  . Hyperlipidemia   . Coronary atherosclerosis of native coronary artery     stable and suspect a component of CAS  . Diverticulosis     seen on colonoscopy in 2008  . H/O colonoscopy 2014  . Prostate cancer     Current Outpatient Prescriptions  Medication Sig Dispense Refill  . Ascorbic Acid (VITAMIN C) 1000 MG tablet Take 1,000 mg by mouth daily.    Marland Kitchen aspirin 81 MG chewable tablet Chew 162 mg by mouth daily.     Marland Kitchen atorvastatin (LIPITOR) 10 MG tablet Take 10  mg by mouth daily.    . cholecalciferol (VITAMIN D) 1000 UNITS tablet Take 5,000 Units by mouth daily.     Marland Kitchen darifenacin (ENABLEX) 15 MG 24 hr tablet Take 15 mg by mouth daily.     Marland Kitchen dexlansoprazole (DEXILANT) 60 MG capsule Take 60 mg by mouth daily.    . hydrOXYzine (ATARAX/VISTARIL) 25 MG tablet Take 50 mg by mouth daily.    Marland Kitchen imatinib (GLEEVEC) 400 MG tablet Take 400 mg by mouth daily. Take with meals and large glass of water.Caution:Chemotherapy.    . isosorbide mononitrate (IMDUR) 60 MG 24 hr tablet Take 60 mg by mouth daily.    . metoprolol succinate (TOPROL-XL) 100 MG 24 hr tablet Take 100 mg by mouth daily. Take with or immediately following a meal.    . Mirabegron ER (MYRBETRIQ) 25 MG TB24 Take 25 mg by mouth daily.    . nitroGLYCERIN (NITROLINGUAL) 0.4 MG/SPRAY spray Place 1 spray under the tongue every 5 (five) minutes x 3 doses as needed for chest pain. 12 g 2  . valsartan (DIOVAN) 40 MG tablet Take 1 tablet by mouth  daily 90 tablet 0   No current facility-administered medications for this visit.    Allergies:    Allergies  Allergen Reactions  . Contrast Media [Iodinated Diagnostic Agents] Other (See Comments)  Reaction unknown    Social History:  The patient  reports that he quit smoking about 13 years ago. His smoking use included Cigarettes. He smoked 0.00 packs per day. He has never used smokeless tobacco. He reports that he drinks about 1.2 oz of alcohol per week. He reports that he does not use illicit drugs.   ROS:  Please see the history of present illness.   Denies claudication. No transient neurological complaints. No episodes of syncope.  Experiencing some nausea today related to [place] therapy All other systems reviewed and negative.   OBJECTIVE: VS:  BP 98/70 mmHg  Pulse 80  Ht 5\' 7"  (1.702 m)  Wt 177 lb 3.2 oz (80.377 kg)  BMI 27.75 kg/m2 Well nourished, well developed, in no acute distress, appears in his stated age 70: normal Neck: JVD flat.  Carotid bruit absent  Cardiac:  normal S1, S2; RRR; no murmur Lungs:  clear to auscultation bilaterally, no wheezing, rhonchi or rales Abd: soft, nontender, no hepatomegaly Ext: Edema absent. Pulses 2+ and symmetric Skin: warm and dry Neuro:  CNs 2-12 intact, no focal abnormalities noted  EKG:  Not performed       Signed, Illene Labrador III, MD 01/25/2014 10:18 AM

## 2014-01-25 NOTE — Patient Instructions (Signed)
Your physician recommends that you continue on your current medications as directed. Please refer to the Current Medication list given to you today.  Your physician wants you to follow-up in: 1 year with Dr.Smith You will receive a reminder letter in the mail two months in advance. If you don't receive a letter, please call our office to schedule the follow-up appointment.  

## 2014-01-26 ENCOUNTER — Other Ambulatory Visit: Payer: Self-pay | Admitting: *Deleted

## 2014-01-26 DIAGNOSIS — Z8546 Personal history of malignant neoplasm of prostate: Secondary | ICD-10-CM

## 2014-01-26 MED ORDER — PROCHLORPERAZINE MALEATE 10 MG PO TABS: 10.0000 mg | ORAL_TABLET | Freq: Four times a day (QID) | ORAL | Status: DC | PRN

## 2014-02-04 ENCOUNTER — Other Ambulatory Visit (HOSPITAL_BASED_OUTPATIENT_CLINIC_OR_DEPARTMENT_OTHER): Payer: 59

## 2014-02-04 ENCOUNTER — Other Ambulatory Visit: Payer: Self-pay | Admitting: Internal Medicine

## 2014-02-04 DIAGNOSIS — C9290 Myeloid leukemia, unspecified, not having achieved remission: Secondary | ICD-10-CM

## 2014-02-04 DIAGNOSIS — C9210 Chronic myeloid leukemia, BCR/ABL-positive, not having achieved remission: Secondary | ICD-10-CM

## 2014-02-04 LAB — COMPREHENSIVE METABOLIC PANEL (CC13)
ALT: 14 U/L (ref 0–55)
AST: 22 U/L (ref 5–34)
Albumin: 3.8 g/dL (ref 3.5–5.0)
Alkaline Phosphatase: 44 U/L (ref 40–150)
Anion Gap: 7 mEq/L (ref 3–11)
BILIRUBIN TOTAL: 0.3 mg/dL (ref 0.20–1.20)
BUN: 10.9 mg/dL (ref 7.0–26.0)
CHLORIDE: 110 meq/L — AB (ref 98–109)
CO2: 26 mEq/L (ref 22–29)
CREATININE: 1.3 mg/dL (ref 0.7–1.3)
Calcium: 8.9 mg/dL (ref 8.4–10.4)
EGFR: 72 mL/min/{1.73_m2} — AB (ref >90)
GLUCOSE: 101 mg/dL (ref 70–140)
Potassium: 3.8 mEq/L (ref 3.5–5.1)
Sodium: 143 mEq/L (ref 136–145)
Total Protein: 6.3 g/dL — ABNORMAL LOW (ref 6.4–8.3)

## 2014-02-04 LAB — CBC WITH DIFFERENTIAL/PLATELET
BASO%: 0.4 % (ref 0.0–2.0)
BASOS ABS: 0 10*3/uL (ref 0.0–0.1)
EOS ABS: 0.1 10*3/uL (ref 0.0–0.5)
EOS%: 2.7 % (ref 0.0–7.0)
HEMATOCRIT: 36.1 % — AB (ref 38.4–49.9)
HEMOGLOBIN: 12.5 g/dL — AB (ref 13.0–17.1)
LYMPH#: 1.9 10*3/uL (ref 0.9–3.3)
LYMPH%: 39.2 % (ref 14.0–49.0)
MCH: 34.1 pg — ABNORMAL HIGH (ref 27.2–33.4)
MCHC: 34.6 g/dL (ref 32.0–36.0)
MCV: 98.4 fL — ABNORMAL HIGH (ref 79.3–98.0)
MONO#: 0.5 10*3/uL (ref 0.1–0.9)
MONO%: 10.1 % (ref 0.0–14.0)
NEUT%: 47.6 % (ref 39.0–75.0)
NEUTROS ABS: 2.3 10*3/uL (ref 1.5–6.5)
PLATELETS: 177 10*3/uL (ref 140–400)
RBC: 3.67 10*6/uL — ABNORMAL LOW (ref 4.20–5.82)
RDW: 14.2 % (ref 11.0–14.6)
WBC: 4.8 10*3/uL (ref 4.0–10.3)

## 2014-02-04 LAB — LACTATE DEHYDROGENASE (CC13): LDH: 183 U/L (ref 125–245)

## 2014-02-09 ENCOUNTER — Other Ambulatory Visit: Payer: Self-pay | Admitting: Interventional Cardiology

## 2014-02-11 ENCOUNTER — Ambulatory Visit (HOSPITAL_BASED_OUTPATIENT_CLINIC_OR_DEPARTMENT_OTHER): Payer: 59 | Admitting: Internal Medicine

## 2014-02-11 ENCOUNTER — Telehealth: Payer: Self-pay | Admitting: Internal Medicine

## 2014-02-11 ENCOUNTER — Encounter: Payer: Self-pay | Admitting: Internal Medicine

## 2014-02-11 VITALS — BP 105/69 | HR 71 | Temp 98.2°F | Resp 18 | Ht 67.0 in | Wt 177.5 lb

## 2014-02-11 DIAGNOSIS — L089 Local infection of the skin and subcutaneous tissue, unspecified: Secondary | ICD-10-CM

## 2014-02-11 DIAGNOSIS — C9210 Chronic myeloid leukemia, BCR/ABL-positive, not having achieved remission: Secondary | ICD-10-CM

## 2014-02-11 DIAGNOSIS — L0292 Furuncle, unspecified: Secondary | ICD-10-CM

## 2014-02-11 MED ORDER — CEPHALEXIN 500 MG PO CAPS: 500.0000 mg | ORAL_CAPSULE | Freq: Four times a day (QID) | ORAL | Status: DC

## 2014-02-11 NOTE — Addendum Note (Signed)
Addended by: Ardeen Garland on: 02/11/2014 03:41 PM   Modules accepted: Orders

## 2014-02-11 NOTE — Progress Notes (Signed)
[facility] [phone]   [phone]  OFFICE PROGRESS NOTE  [name], [place] [address] [place] [address]  DIAGNOSIS:  1) Chronic myeloid leukemia diagnosed in November of 2014. 2) history of prostate adenocarcinoma diagnosed in 2006  PRIOR THERAPY: Status post prostatectomy.  CURRENT THERAPY: Gleevec 400 mg by mouth daily, started 02/10/2013.  INTERVAL HISTORY: [name] 58 y.o. male returns to the clinic today for followup visit. The patient is feeling fine today with no specific complaints except for occasional nausea now usually in the afternoon. It resolved with antiemetics. He recently developed a small boils in the left axilla after shaving. No palpable lymphadenopathy. He denied having any significant weight loss or night sweats. He is tolerating his treatment with Gleevec fairly well. He denied having any significant chest pain, cough or hemoptysis. He has no vomiting or change in bowel movement. The patient denied having any fever or chills. Has no significant weight loss or night sweats. He has repeat CBC, comprehensive metabolic panel, LDH and molecular study for BCR/ABL performed recently and he is here for evaluation and discussion of his lab results.  MEDICAL HISTORY: Past Medical History  Diagnosis Date  . Myocardial infarction 02,6/12  . Hypertension   . Arthritis   . Cancer     hx prostate cancer  . Bladder disorder   . Coronary artery disease     with prior MI 2002(BMS) and Cfx(2006) and RCA (2004) stents . EF 45-50%  . Hyperlipidemia   . Coronary atherosclerosis of native coronary artery     stable and suspect a component of CAS  . Diverticulosis     seen on colonoscopy in 2008  . H/O colonoscopy 2014  . Prostate cancer     ALLERGIES:  is allergic to contrast media.  MEDICATIONS:  Current Outpatient Prescriptions  Medication Sig Dispense Refill  . Ascorbic Acid (VITAMIN C) 1000  MG tablet Take 1,000 mg by mouth daily.    Marland Kitchen aspirin 81 MG chewable tablet Chew 162 mg by mouth daily.     Marland Kitchen atorvastatin (LIPITOR) 10 MG tablet Take 10 mg by mouth daily.    . cholecalciferol (VITAMIN D) 1000 UNITS tablet Take 5,000 Units by mouth daily.     Marland Kitchen darifenacin (ENABLEX) 15 MG 24 hr tablet Take 15 mg by mouth daily.     Marland Kitchen dexlansoprazole (DEXILANT) 60 MG capsule Take 60 mg by mouth daily.    Marland Kitchen GLEEVEC 400 MG tablet Take 1 tablet by mouth  daily with a meal and a  large glass of water 30 tablet 2  . hydrOXYzine (ATARAX/VISTARIL) 25 MG tablet Take 50 mg by mouth daily.    . isosorbide mononitrate (IMDUR) 60 MG 24 hr tablet Take 1 tablet by mouth once a day 90 tablet 3  . metoprolol succinate (TOPROL-XL) 100 MG 24 hr tablet Take 1 tablet by mouth once a day 90 tablet 3  . Mirabegron ER (MYRBETRIQ) 25 MG TB24 Take 25 mg by mouth daily.    . prochlorperazine (COMPAZINE) 10 MG tablet Take 1 tablet (10 mg total) by mouth every 6 (six) hours as needed for nausea or vomiting. 30 tablet 3  . nitroGLYCERIN (NITROLINGUAL) 0.4 MG/SPRAY spray Place 1 spray under the tongue every 5 (five) minutes x 3 doses as needed for chest pain. (Patient not taking: Reported on 02/11/2014) 12 g 2  . valsartan (DIOVAN) 40 MG tablet Take 1 tablet by mouth  daily 90 tablet 0   No current facility-administered medications for this visit.    SURGICAL HISTORY:  Past Surgical History  Procedure Laterality Date  . Carotid stent    . Tonsillectomy    . Fracture surgery      rt wrist  . Toe surgery      rt great toe  . Wrist surgery      rt and lt cysts removed  . Orif ankle fracture  07/04/2011    Procedure: OPEN REDUCTION INTERNAL FIXATION (ORIF) ANKLE FRACTURE;  Surgeon: Alta Corning, MD;  Location: [facility];  Service: Orthopedics;  Laterality: Right;  . Cardiac catheterization      normal EF, patent stents without obstructive disease (on Plavix X 1 month only)    REVIEW OF SYSTEMS:   Constitutional: negative Eyes: negative Ears, nose, mouth, throat, and face: negative Respiratory: negative Cardiovascular: negative Gastrointestinal: negative Genitourinary:negative Integument/breast: negative Hematologic/lymphatic: negative Musculoskeletal:negative Neurological: negative Behavioral/Psych: negative Endocrine: negative Allergic/Immunologic: negative   PHYSICAL EXAMINATION: General appearance: alert, cooperative and no distress Head: Normocephalic, without obvious abnormality, atraumatic Neck: no adenopathy, no JVD, supple, symmetrical, trachea midline and thyroid not enlarged, symmetric, no tenderness/mass/nodules Lymph nodes: Cervical, supraclavicular, and axillary nodes normal. Resp: clear to auscultation bilaterally Back: symmetric, no curvature. ROM normal. No CVA tenderness. Cardio: regular rate and rhythm, S1, S2 normal, no murmur, click, rub or gallop GI: soft, non-tender; bowel sounds normal; no masses,  no organomegaly Extremities: extremities normal, atraumatic, no cyanosis or edema Neurologic: Alert and oriented X 3, normal strength and tone. Normal symmetric reflexes. Normal coordination and gait  Skin exam: small boil in the left axilla.  ECOG PERFORMANCE STATUS: 0 - Asymptomatic  Blood pressure 105/69, pulse 71, temperature 98.2 F (36.8 C), temperature source Oral, resp. rate 18, height 5\' 7"  (6.073 m), weight 177 lb 8 oz (80.513 kg), SpO2 100 %.  LABORATORY DATA: Lab Results  Component Value Date   WBC 4.8 02/04/2014   HGB 12.5* 02/04/2014   HCT 36.1* 02/04/2014   MCV 98.4* 02/04/2014   PLT 177 02/04/2014      Chemistry      Component Value Date/Time   NA 143 02/04/2014 0826   NA 140 12/13/2013 1045   K 3.8 02/04/2014 0826   K 4.3 12/13/2013 1045   CL 105 12/13/2013 1045   CO2 26 02/04/2014 0826   CO2 26 12/13/2013 1045   BUN 10.9 02/04/2014 0826   BUN 12 12/13/2013 1045   CREATININE 1.3 02/04/2014 0826   CREATININE 1.16  12/13/2013 1045      Component Value Date/Time   CALCIUM 8.9 02/04/2014 0826   CALCIUM 8.6 12/13/2013 1045   ALKPHOS 44 02/04/2014 0826   ALKPHOS 43 12/13/2013 1045   AST 22 02/04/2014 0826   AST 19 12/13/2013 1045   ALT 14 02/04/2014 0826   ALT 12 12/13/2013 1045   BILITOT 0.30 02/04/2014 0826   BILITOT <0.2* 12/13/2013 1045       RADIOGRAPHIC STUDIES: No results found.  ASSESSMENT AND PLAN: This is a very pleasant 58 years old [place] American male recently diagnosed with chronic myeloid leukemia. He is currently on treatment with Gleevec 400 mg by mouth daily and tolerating it fairly well. The recent blood work showed no significant morphological abnormalities but the BCR/ABL showed detectable copies representing 0.03%. I discussed the lab result with the patient today. I recommended for him to continue his current treatment with Gleevec 400 mg by mouth daily. I will  repeat the blood work including the molecular study for BCR/ABL in 3 months and if it continues to show trend for increased I would consider the patient for alternative tyrosine kinase inhibitor. Further nausea, I advise him to take the prescribed antiemetic.  For the skin infection in the left axilla, I will start the patient on Keflex 500 mg by mouth 4 times a day for 7 days. He was advised to call back or see his primary care physician if no improvement in this lesion. He was advised to call immediately if he has any concerning symptoms in the interval. The patient voices understanding of current disease status and treatment options and is in agreement with the current care plan.  All questions were answered. The patient knows to call the clinic with any problems, questions or concerns. We can certainly see the patient much sooner if necessary.  Disclaimer: This note was dictated with voice recognition software. Similar sounding words can inadvertently be transcribed and may not be corrected upon review.

## 2014-02-11 NOTE — Telephone Encounter (Signed)
Gave avs & cal for March. °

## 2014-02-23 ENCOUNTER — Encounter: Payer: Self-pay | Admitting: Interventional Cardiology

## 2014-03-01 ENCOUNTER — Other Ambulatory Visit: Payer: Self-pay | Admitting: *Deleted

## 2014-03-01 ENCOUNTER — Encounter: Payer: Self-pay | Admitting: *Deleted

## 2014-03-01 MED ORDER — IMATINIB MESYLATE 400 MG PO TABS: 400.0000 mg | ORAL_TABLET | Freq: Every day | ORAL | Status: DC

## 2014-03-01 NOTE — Progress Notes (Signed)
New script for gleevec faxed to [facility] [phone]  Phone [phone]. D/t change in patient's insurance companies for the new year.

## 2014-03-02 NOTE — Progress Notes (Signed)
RECEIVED A FAX FROM [facility] CONCERNING A CONFIRMATION OF FACSIMILE RECEIPT FOR PT. REFERRAL.

## 2014-03-15 ENCOUNTER — Other Ambulatory Visit: Payer: Self-pay | Admitting: Medical Oncology

## 2014-03-15 ENCOUNTER — Encounter: Payer: Self-pay | Admitting: Internal Medicine

## 2014-03-15 DIAGNOSIS — C9210 Chronic myeloid leukemia, BCR/ABL-positive, not having achieved remission: Secondary | ICD-10-CM

## 2014-03-15 MED ORDER — IMATINIB MESYLATE 400 MG PO TABS: 400.0000 mg | ORAL_TABLET | Freq: Every day | ORAL | Status: DC

## 2014-03-15 NOTE — Progress Notes (Signed)
Faxed gleevec pa form to [facility] °

## 2014-03-15 NOTE — Progress Notes (Signed)
Pt called and said he needs prior auth for gleevec through [facility]. In basket message sent to [facility]

## 2014-03-16 ENCOUNTER — Encounter: Payer: Self-pay | Admitting: Internal Medicine

## 2014-03-16 NOTE — Progress Notes (Signed)
[facility] approved gleevec from 03/15/14-03/15/15

## 2014-04-21 ENCOUNTER — Other Ambulatory Visit: Payer: Self-pay | Admitting: Interventional Cardiology

## 2014-04-22 ENCOUNTER — Other Ambulatory Visit: Payer: Self-pay

## 2014-04-22 MED ORDER — VALSARTAN 40 MG PO TABS: ORAL_TABLET | ORAL | Status: DC

## 2014-04-23 ENCOUNTER — Other Ambulatory Visit: Payer: Self-pay | Admitting: *Deleted

## 2014-04-23 MED ORDER — VALSARTAN 40 MG PO TABS: ORAL_TABLET | ORAL | Status: DC

## 2014-05-06 ENCOUNTER — Other Ambulatory Visit (HOSPITAL_BASED_OUTPATIENT_CLINIC_OR_DEPARTMENT_OTHER): Payer: 59

## 2014-05-06 DIAGNOSIS — C9210 Chronic myeloid leukemia, BCR/ABL-positive, not having achieved remission: Secondary | ICD-10-CM

## 2014-05-06 LAB — COMPREHENSIVE METABOLIC PANEL (CC13)
ALT: 9 U/L (ref 0–55)
ANION GAP: 6 meq/L (ref 3–11)
AST: 18 U/L (ref 5–34)
Albumin: 3.6 g/dL (ref 3.5–5.0)
Alkaline Phosphatase: 45 U/L (ref 40–150)
BUN: 11.7 mg/dL (ref 7.0–26.0)
CALCIUM: 8.5 mg/dL (ref 8.4–10.4)
CHLORIDE: 109 meq/L (ref 98–109)
CO2: 26 meq/L (ref 22–29)
Creatinine: 1.1 mg/dL (ref 0.7–1.3)
EGFR: 86 mL/min/{1.73_m2} — ABNORMAL LOW (ref >90)
GLUCOSE: 102 mg/dL (ref 70–140)
Potassium: 4 mEq/L (ref 3.5–5.1)
Sodium: 141 mEq/L (ref 136–145)
Total Bilirubin: 0.3 mg/dL (ref 0.20–1.20)
Total Protein: 5.9 g/dL — ABNORMAL LOW (ref 6.4–8.3)

## 2014-05-06 LAB — CBC WITH DIFFERENTIAL/PLATELET
BASO%: 0.7 % (ref 0.0–2.0)
BASOS ABS: 0 10*3/uL (ref 0.0–0.1)
EOS%: 1.5 % (ref 0.0–7.0)
Eosinophils Absolute: 0.1 10*3/uL (ref 0.0–0.5)
HCT: 35.9 % — ABNORMAL LOW (ref 38.4–49.9)
HGB: 12 g/dL — ABNORMAL LOW (ref 13.0–17.1)
LYMPH#: 1.7 10*3/uL (ref 0.9–3.3)
LYMPH%: 29.3 % (ref 14.0–49.0)
MCH: 33.7 pg — ABNORMAL HIGH (ref 27.2–33.4)
MCHC: 33.4 g/dL (ref 32.0–36.0)
MCV: 100.7 fL — ABNORMAL HIGH (ref 79.3–98.0)
MONO#: 0.5 10*3/uL (ref 0.1–0.9)
MONO%: 8.8 % (ref 0.0–14.0)
NEUT#: 3.5 10*3/uL (ref 1.5–6.5)
NEUT%: 59.7 % (ref 39.0–75.0)
Platelets: 209 10*3/uL (ref 140–400)
RBC: 3.57 10*6/uL — AB (ref 4.20–5.82)
RDW: 14.3 % (ref 11.0–14.6)
WBC: 5.9 10*3/uL (ref 4.0–10.3)

## 2014-05-06 LAB — LACTATE DEHYDROGENASE (CC13): LDH: 168 U/L (ref 125–245)

## 2014-05-13 ENCOUNTER — Ambulatory Visit (HOSPITAL_BASED_OUTPATIENT_CLINIC_OR_DEPARTMENT_OTHER): Payer: 59 | Admitting: Internal Medicine

## 2014-05-13 ENCOUNTER — Telehealth: Payer: Self-pay | Admitting: Internal Medicine

## 2014-05-13 ENCOUNTER — Encounter: Payer: Self-pay | Admitting: Internal Medicine

## 2014-05-13 VITALS — BP 115/94 | HR 81 | Temp 98.3°F | Resp 18 | Ht 67.0 in | Wt 177.4 lb

## 2014-05-13 DIAGNOSIS — C9210 Chronic myeloid leukemia, BCR/ABL-positive, not having achieved remission: Secondary | ICD-10-CM

## 2014-05-13 NOTE — Telephone Encounter (Signed)
Pt confirmed labs/ov per 03/10 POF, gave pt AVS..... KJ °

## 2014-05-13 NOTE — Progress Notes (Signed)
[name] [phone]) [phone]   [phone]) [phone]  OFFICE PROGRESS NOTE  [name], [place] [address] [place] [address]  DIAGNOSIS:  1) Chronic myeloid leukemia diagnosed in November of 2014. 2) history of prostate adenocarcinoma diagnosed in 2006  PRIOR THERAPY: Status post prostatectomy.  CURRENT THERAPY: Gleevec 400 mg by mouth daily, started 02/10/2013.  INTERVAL HISTORY: [name] 59 y.o. male returns to the clinic today for followup visit. The patient is feeling fine today with no specific complaints except for pain started earlier today in the left neck area with radiation to the left arm. He has no chest pain or shortness of breath. He has some difficulty raising his left arm. He denied having any significant weight loss or night sweats. He is tolerating his treatment with Gleevec fairly well. He denied having any significant chest pain, shortness of breath, cough or hemoptysis. He has no vomiting or change in bowel movement. The patient denied having any fever or chills. Has no significant weight loss or night sweats. He has repeat CBC, comprehensive metabolic panel, LDH and molecular study for BCR/ABL performed recently and he is here for evaluation and discussion of his lab results.  MEDICAL HISTORY: Past Medical History  Diagnosis Date  . Myocardial infarction 02,6/12  . Hypertension   . Arthritis   . Cancer     hx prostate cancer  . Bladder disorder   . Coronary artery disease     with prior MI 2002(BMS) and Cfx(2006) and RCA (2004) stents . EF 45-50%  . Hyperlipidemia   . Coronary atherosclerosis of native coronary artery     stable and suspect a component of CAS  . Diverticulosis     seen on colonoscopy in 2008  . H/O colonoscopy 2014  . Prostate cancer     ALLERGIES:  is allergic to contrast media.  MEDICATIONS:  Current Outpatient Prescriptions  Medication Sig Dispense Refill  . Ascorbic Acid  (VITAMIN C) 1000 MG tablet Take 1,000 mg by mouth daily.    Marland Kitchen aspirin 81 MG chewable tablet Chew 162 mg by mouth daily.     Marland Kitchen atorvastatin (LIPITOR) 10 MG tablet Take 10 mg by mouth daily.    . cholecalciferol (VITAMIN D) 1000 UNITS tablet Take 5,000 Units by mouth daily.     Marland Kitchen darifenacin (ENABLEX) 15 MG 24 hr tablet Take 15 mg by mouth daily.     Marland Kitchen dexlansoprazole (DEXILANT) 60 MG capsule Take 60 mg by mouth daily.    . hydrOXYzine (ATARAX/VISTARIL) 25 MG tablet Take 50 mg by mouth daily.    Marland Kitchen imatinib (GLEEVEC) 400 MG tablet Take 1 tablet (400 mg total) by mouth daily. Take with meals and large glass of water.Caution:Chemotherapy. 30 tablet 0  . isosorbide mononitrate (IMDUR) 60 MG 24 hr tablet Take 1 tablet by mouth once a day 90 tablet 3  . metoprolol succinate (TOPROL-XL) 100 MG 24 hr tablet Take 1 tablet by mouth once a day 90 tablet 3  . Mirabegron ER (MYRBETRIQ) 25 MG TB24 Take 25 mg by mouth daily.    . nitroGLYCERIN (NITROLINGUAL) 0.4 MG/SPRAY spray Place 1 spray under the tongue every 5 (five) minutes x 3 doses as needed for chest pain. 12 g 2  . prochlorperazine (COMPAZINE) 10 MG tablet Take 1 tablet (10 mg total) by mouth every 6 (six) hours as needed for nausea or vomiting. 30 tablet 3  . valsartan (DIOVAN) 40 MG tablet Take  1 tablet by mouth  daily 90 tablet 1   No current facility-administered medications for this visit.    SURGICAL HISTORY:  Past Surgical History  Procedure Laterality Date  . Carotid stent    . Tonsillectomy    . Fracture surgery      rt wrist  . Toe surgery      rt great toe  . Wrist surgery      rt and lt cysts removed  . Orif ankle fracture  07/04/2011    Procedure: OPEN REDUCTION INTERNAL FIXATION (ORIF) ANKLE FRACTURE;  Surgeon: Alta Corning, MD;  Location: [facility];  Service: Orthopedics;  Laterality: Right;  . Cardiac catheterization      normal EF, patent stents without obstructive disease (on Plavix X 1 month only)  . Left  heart catheterization with coronary angiogram N/A 02/04/2012    Procedure: LEFT HEART CATHETERIZATION WITH CORONARY ANGIOGRAM;  Surgeon: Sinclair Grooms, MD;  Location: [facility] CATH LAB;  Service: Cardiovascular;  Laterality: N/A;    REVIEW OF SYSTEMS:  Constitutional: negative Eyes: negative Ears, nose, mouth, throat, and face: negative Respiratory: negative Cardiovascular: negative Gastrointestinal: negative Genitourinary:negative Integument/breast: negative Hematologic/lymphatic: negative Musculoskeletal:negative Neurological: negative Behavioral/Psych: negative Endocrine: negative Allergic/Immunologic: negative   PHYSICAL EXAMINATION: General appearance: alert, cooperative and no distress Head: Normocephalic, without obvious abnormality, atraumatic Neck: no adenopathy, no JVD, supple, symmetrical, trachea midline and thyroid not enlarged, symmetric, no tenderness/mass/nodules Lymph nodes: Cervical, supraclavicular, and axillary nodes normal. Resp: clear to auscultation bilaterally Back: symmetric, no curvature. ROM normal. No CVA tenderness. Cardio: regular rate and rhythm, S1, S2 normal, no murmur, click, rub or gallop GI: soft, non-tender; bowel sounds normal; no masses,  no organomegaly Extremities: extremities normal, atraumatic, no cyanosis or edema Neurologic: Alert and oriented X 3, normal strength and tone. Normal symmetric reflexes. Normal coordination and gait  Skin exam: small boil in the left axilla.  ECOG PERFORMANCE STATUS: 0 - Asymptomatic  Blood pressure 115/94, pulse 81, temperature 98.3 F (36.8 C), temperature source Oral, resp. rate 18, height 5\' 7"  (1.702 m), weight 177 lb 6.4 oz (80.468 kg), SpO2 100 %.  LABORATORY DATA: Lab Results  Component Value Date   WBC 5.9 05/06/2014   HGB 12.0* 05/06/2014   HCT 35.9* 05/06/2014   MCV 100.7* 05/06/2014   PLT 209 05/06/2014      Chemistry      Component Value Date/Time   NA 141 05/06/2014 0810   NA 140  12/13/2013 1045   K 4.0 05/06/2014 0810   K 4.3 12/13/2013 1045   CL 105 12/13/2013 1045   CO2 26 05/06/2014 0810   CO2 26 12/13/2013 1045   BUN 11.7 05/06/2014 0810   BUN 12 12/13/2013 1045   CREATININE 1.1 05/06/2014 0810   CREATININE 1.16 12/13/2013 1045      Component Value Date/Time   CALCIUM 8.5 05/06/2014 0810   CALCIUM 8.6 12/13/2013 1045   ALKPHOS 45 05/06/2014 0810   ALKPHOS 43 12/13/2013 1045   AST 18 05/06/2014 0810   AST 19 12/13/2013 1045   ALT 9 05/06/2014 0810   ALT 12 12/13/2013 1045   BILITOT 0.30 05/06/2014 0810   BILITOT <0.2* 12/13/2013 1045       RADIOGRAPHIC STUDIES: No results found.  ASSESSMENT AND PLAN: This is a very pleasant 59 years old [place] American male recently diagnosed with chronic myeloid leukemia. He is currently on treatment with Gleevec 400 mg by mouth daily and tolerating it fairly well. The  recent CBC and molecular study for BCR/ABL showed stable disease. I discussed the lab result with the patient today. I recommended for him to continue his current treatment with Gleevec 400 mg by mouth daily. He would come back for follow-up visit in 3 months with repeat CBC, comprehensive metabolic panel and LDH For the left neck and shoulder pain this is most likely musculoskeletal secondary to abnormal history position. I recommended for the patient to take 2 ibuprofen today and if no improvement to consult with orthopedic surgeon. He was advised to call immediately if he has any concerning symptoms in the interval. The patient voices understanding of current disease status and treatment options and is in agreement with the current care plan.  All questions were answered. The patient knows to call the clinic with any problems, questions or concerns. We can certainly see the patient much sooner if necessary.  Disclaimer: This note was dictated with voice recognition software. Similar sounding words can inadvertently be transcribed and may not be  corrected upon review.

## 2014-06-03 ENCOUNTER — Other Ambulatory Visit: Payer: Self-pay | Admitting: *Deleted

## 2014-06-03 MED ORDER — ISOSORBIDE MONONITRATE ER 60 MG PO TB24: 60.0000 mg | ORAL_TABLET | Freq: Every day | ORAL | Status: DC

## 2014-06-03 MED ORDER — METOPROLOL SUCCINATE ER 100 MG PO TB24: 100.0000 mg | ORAL_TABLET | Freq: Every day | ORAL | Status: DC

## 2014-07-08 ENCOUNTER — Other Ambulatory Visit: Payer: Self-pay

## 2014-07-08 MED ORDER — ATORVASTATIN CALCIUM 10 MG PO TABS: 10.0000 mg | ORAL_TABLET | Freq: Every day | ORAL | Status: DC

## 2014-07-13 ENCOUNTER — Other Ambulatory Visit: Payer: Self-pay | Admitting: *Deleted

## 2014-07-13 DIAGNOSIS — C9210 Chronic myeloid leukemia, BCR/ABL-positive, not having achieved remission: Secondary | ICD-10-CM

## 2014-07-13 MED ORDER — IMATINIB MESYLATE 400 MG PO TABS: 400.0000 mg | ORAL_TABLET | Freq: Every day | ORAL | Status: DC

## 2014-07-13 NOTE — Telephone Encounter (Signed)
Refill request received from [facility] for Gleevac 400mg  1 PO Daily. Reviewed with MD,ok to refill, rx  escribed

## 2014-08-05 ENCOUNTER — Telehealth: Payer: Self-pay | Admitting: *Deleted

## 2014-08-05 NOTE — Telephone Encounter (Signed)
REMINDED PT. OF HIS APPOINTMENT ON 08/12/14. HE HAS #24 TABLETS LEFT. SUGGESTED TO REFILL MEDICATION AFTER HIS LAB WORK AND APPOINTMENT WITH DR.MOHAMED. PT.VOICES UNDERSTANDING.

## 2014-08-12 ENCOUNTER — Encounter: Payer: Self-pay | Admitting: Internal Medicine

## 2014-08-12 ENCOUNTER — Telehealth: Payer: Self-pay | Admitting: Internal Medicine

## 2014-08-12 ENCOUNTER — Other Ambulatory Visit (HOSPITAL_BASED_OUTPATIENT_CLINIC_OR_DEPARTMENT_OTHER): Payer: 59

## 2014-08-12 ENCOUNTER — Ambulatory Visit (HOSPITAL_BASED_OUTPATIENT_CLINIC_OR_DEPARTMENT_OTHER): Payer: 59 | Admitting: Internal Medicine

## 2014-08-12 VITALS — BP 113/76 | HR 77 | Temp 98.3°F | Resp 18 | Ht 67.0 in | Wt 174.3 lb

## 2014-08-12 DIAGNOSIS — Z8546 Personal history of malignant neoplasm of prostate: Secondary | ICD-10-CM

## 2014-08-12 DIAGNOSIS — C9210 Chronic myeloid leukemia, BCR/ABL-positive, not having achieved remission: Secondary | ICD-10-CM | POA: Diagnosis not present

## 2014-08-12 LAB — COMPREHENSIVE METABOLIC PANEL (CC13)
ALBUMIN: 3.6 g/dL (ref 3.5–5.0)
ALK PHOS: 46 U/L (ref 40–150)
ALT: 7 U/L (ref 0–55)
ANION GAP: 5 meq/L (ref 3–11)
AST: 14 U/L (ref 5–34)
BILIRUBIN TOTAL: 0.37 mg/dL (ref 0.20–1.20)
BUN: 12.1 mg/dL (ref 7.0–26.0)
CO2: 26 meq/L (ref 22–29)
Calcium: 8.6 mg/dL (ref 8.4–10.4)
Chloride: 110 mEq/L — ABNORMAL HIGH (ref 98–109)
Creatinine: 1.1 mg/dL (ref 0.7–1.3)
EGFR: 86 mL/min/{1.73_m2} — ABNORMAL LOW (ref >90)
GLUCOSE: 103 mg/dL (ref 70–140)
POTASSIUM: 3.8 meq/L (ref 3.5–5.1)
Sodium: 142 mEq/L (ref 136–145)
Total Protein: 6 g/dL — ABNORMAL LOW (ref 6.4–8.3)

## 2014-08-12 LAB — CBC WITH DIFFERENTIAL/PLATELET
BASO%: 0.8 % (ref 0.0–2.0)
Basophils Absolute: 0 10*3/uL (ref 0.0–0.1)
EOS ABS: 0.1 10*3/uL (ref 0.0–0.5)
EOS%: 2.9 % (ref 0.0–7.0)
HCT: 34.5 % — ABNORMAL LOW (ref 38.4–49.9)
HEMOGLOBIN: 12 g/dL — AB (ref 13.0–17.1)
LYMPH%: 33.3 % (ref 14.0–49.0)
MCH: 34.5 pg — AB (ref 27.2–33.4)
MCHC: 34.9 g/dL (ref 32.0–36.0)
MCV: 98.8 fL — AB (ref 79.3–98.0)
MONO#: 0.5 10*3/uL (ref 0.1–0.9)
MONO%: 10.5 % (ref 0.0–14.0)
NEUT#: 2.6 10*3/uL (ref 1.5–6.5)
NEUT%: 52.5 % (ref 39.0–75.0)
Platelets: 220 10*3/uL (ref 140–400)
RBC: 3.49 10*6/uL — ABNORMAL LOW (ref 4.20–5.82)
RDW: 14 % (ref 11.0–14.6)
WBC: 5 10*3/uL (ref 4.0–10.3)
lymph#: 1.7 10*3/uL (ref 0.9–3.3)

## 2014-08-12 LAB — LACTATE DEHYDROGENASE (CC13): LDH: 159 U/L (ref 125–245)

## 2014-08-12 MED ORDER — IMATINIB MESYLATE 400 MG PO TABS: 400.0000 mg | ORAL_TABLET | Freq: Every day | ORAL | Status: DC

## 2014-08-12 NOTE — Progress Notes (Signed)
[name]   [phone]  OFFICE PROGRESS NOTE  [name], [name] [id number]  DIAGNOSIS:  1) Chronic myeloid leukemia diagnosed in November of 2014. 2) History of prostate adenocarcinoma diagnosed in 2006  PRIOR THERAPY: Status post prostatectomy.  CURRENT THERAPY: Gleevec 400 mg by mouth daily, started 02/10/2013.  INTERVAL HISTORY: [name] 59 y.o. male returns to the clinic today for followup visit. The patient is feeling fine today with no specific complaints except for pain in his knees after walking from the parking lot to the [place]. He has no chest pain or shortness of breath. He denied having any significant weight loss or night sweats. He is tolerating his treatment with Gleevec fairly well. He denied having any significant chest pain, shortness of breath, cough or hemoptysis. He has no vomiting or change in bowel movement. The patient denied having any fever or chills. He has no significant weight loss or night sweats. He has repeat CBC, comprehensive metabolic panel, and LDH  performed recently and he is here for evaluation and discussion of his lab results.  MEDICAL HISTORY: Past Medical History  Diagnosis Date  . Myocardial infarction 02,6/12  . Hypertension   . Arthritis   . Cancer     hx prostate cancer  . Bladder disorder   . Coronary artery disease     with prior MI 2002([facility]) and Cfx(2006) and RCA (2004) stents . EF 45-50%  . Hyperlipidemia   . Coronary atherosclerosis of native coronary artery     stable and suspect a component of CAS  . Diverticulosis     seen on colonoscopy in 2008  . H/O colonoscopy 2014  . Prostate cancer     ALLERGIES:  is allergic to contrast media.  MEDICATIONS:  Current Outpatient Prescriptions  Medication Sig Dispense Refill  . Ascorbic Acid (VITAMIN C) 1000 MG tablet Take 1,000 mg by mouth daily.    Marland Kitchen aspirin 81 MG  chewable tablet Chew 162 mg by mouth daily.     Marland Kitchen atorvastatin (LIPITOR) 10 MG tablet Take 1 tablet (10 mg total) by mouth daily. 90 tablet 1  . cholecalciferol (VITAMIN D) 1000 UNITS tablet Take 5,000 Units by mouth daily.     . cyclobenzaprine (FLEXERIL) 10 MG tablet TAKE 1 TABLET BY ORAL ROUTE EVERY DAY AT BEDTIME AS NEEDED  1  . darifenacin (ENABLEX) 15 MG 24 hr tablet Take 15 mg by mouth daily.     Marland Kitchen dexlansoprazole (DEXILANT) 60 MG capsule Take 60 mg by mouth daily.    . hydrOXYzine (ATARAX/VISTARIL) 25 MG tablet Take 50 mg by mouth daily.    Marland Kitchen imatinib (GLEEVEC) 400 MG tablet Take 1 tablet (400 mg total) by mouth daily. Take with meals and large glass of water.Caution:Chemotherapy. 30 tablet 0  . isosorbide mononitrate (IMDUR) 60 MG 24 hr tablet Take 1 tablet (60 mg total) by mouth daily. 30 tablet 5  . metoprolol succinate (TOPROL-XL) 100 MG 24 hr tablet Take 1 tablet (100 mg total) by mouth daily. Take with or immediately following a meal. 30 tablet 5  . Mirabegron ER (MYRBETRIQ) 25 MG TB24 Take 25 mg by mouth daily.    . nitroGLYCERIN (NITROLINGUAL) 0.4 MG/SPRAY spray Place 1 spray under the tongue every 5 (five) minutes x 3 doses as needed for chest pain. 12 g 2  . prochlorperazine (COMPAZINE) 10 MG tablet Take 1 tablet (10 mg total)  by mouth every 6 (six) hours as needed for nausea or vomiting. 30 tablet 3  . traMADol (ULTRAM) 50 MG tablet Take 50 mg by mouth every 6 (six) hours.  0  . valsartan (DIOVAN) 40 MG tablet Take 1 tablet by mouth  daily 90 tablet 1   No current facility-administered medications for this visit.    SURGICAL HISTORY:  Past Surgical History  Procedure Laterality Date  . Carotid stent    . Tonsillectomy    . Fracture surgery      rt wrist  . Toe surgery      rt great toe  . Wrist surgery      rt and lt cysts removed  . Orif ankle fracture  07/04/2011    Procedure: OPEN REDUCTION INTERNAL FIXATION (ORIF) ANKLE FRACTURE;  Surgeon: Alta Corning, MD;   Location: [facility];  Service: Orthopedics;  Laterality: Right;  . Cardiac catheterization      normal EF, patent stents without obstructive disease (on Plavix X 1 month only)  . Left heart catheterization with coronary angiogram N/A 02/04/2012    Procedure: LEFT HEART CATHETERIZATION WITH CORONARY ANGIOGRAM;  Surgeon: Sinclair Grooms, MD;  Location: [facility] CATH LAB;  Service: Cardiovascular;  Laterality: N/A;    REVIEW OF SYSTEMS:  Constitutional: negative Eyes: negative Ears, nose, mouth, throat, and face: negative Respiratory: negative Cardiovascular: negative Gastrointestinal: negative Genitourinary:negative Integument/breast: negative Hematologic/lymphatic: negative Musculoskeletal:negative Neurological: negative Behavioral/Psych: negative Endocrine: negative Allergic/Immunologic: negative   PHYSICAL EXAMINATION: General appearance: alert, cooperative and no distress Head: Normocephalic, without obvious abnormality, atraumatic Neck: no adenopathy, no JVD, supple, symmetrical, trachea midline and thyroid not enlarged, symmetric, no tenderness/mass/nodules Lymph nodes: Cervical, supraclavicular, and axillary nodes normal. Resp: clear to auscultation bilaterally Back: symmetric, no curvature. ROM normal. No CVA tenderness. Cardio: regular rate and rhythm, S1, S2 normal, no murmur, click, rub or gallop GI: soft, non-tender; bowel sounds normal; no masses,  no organomegaly Extremities: extremities normal, atraumatic, no cyanosis or edema Neurologic: Alert and oriented X 3, normal strength and tone. Normal symmetric reflexes. Normal coordination and gait  Skin exam: small boil in the left axilla.  ECOG PERFORMANCE STATUS: 0 - Asymptomatic  Blood pressure 113/76, pulse 77, temperature 98.3 F (36.8 C), temperature source Oral, resp. rate 18, height 5\' 7"  (1.702 m), weight 174 lb 4.8 oz (79.062 kg), SpO2 100 %.  LABORATORY DATA: Lab Results  Component Value Date    WBC 5.0 08/12/2014   HGB 12.0* 08/12/2014   HCT 34.5* 08/12/2014   MCV 98.8* 08/12/2014   PLT 220 08/12/2014      Chemistry      Component Value Date/Time   NA 141 05/06/2014 0810   NA 140 12/13/2013 1045   K 4.0 05/06/2014 0810   K 4.3 12/13/2013 1045   CL 105 12/13/2013 1045   CO2 26 05/06/2014 0810   CO2 26 12/13/2013 1045   BUN 11.7 05/06/2014 0810   BUN 12 12/13/2013 1045   CREATININE 1.1 05/06/2014 0810   CREATININE 1.16 12/13/2013 1045      Component Value Date/Time   CALCIUM 8.5 05/06/2014 0810   CALCIUM 8.6 12/13/2013 1045   ALKPHOS 45 05/06/2014 0810   ALKPHOS 43 12/13/2013 1045   AST 18 05/06/2014 0810   AST 19 12/13/2013 1045   ALT 9 05/06/2014 0810   ALT 12 12/13/2013 1045   BILITOT 0.30 05/06/2014 0810   BILITOT <0.2* 12/13/2013 1045       RADIOGRAPHIC STUDIES: No  results found.  ASSESSMENT AND PLAN: This is a very pleasant 59 years old [place] American male recently diagnosed with chronic myeloid leukemia. He is currently on treatment with Gleevec 400 mg by mouth daily and tolerating it fairly well. His CBC today showed a stable white blood count as well as a stable hemoglobin hematocrit and platelets count. I discussed the lab result with the patient today. I recommended for him to continue his current treatment with Gleevec 400 mg by mouth daily. He would come back for follow-up visit in 3 months with repeat CBC, comprehensive metabolic panel and LDH He was advised to call immediately if he has any concerning symptoms in the interval. The patient voices understanding of current disease status and treatment options and is in agreement with the current care plan.  All questions were answered. The patient knows to call the clinic with any problems, questions or concerns. We can certainly see the patient much sooner if necessary.  Disclaimer: This note was dictated with voice recognition software. Similar sounding words can inadvertently be transcribed  and may not be corrected upon review.

## 2014-08-12 NOTE — Telephone Encounter (Signed)
s.w. pt and advised on SEpt appt...pt ok and aware °

## 2014-08-30 ENCOUNTER — Other Ambulatory Visit: Payer: Self-pay

## 2014-10-14 ENCOUNTER — Telehealth: Payer: Self-pay

## 2014-10-14 DIAGNOSIS — C9210 Chronic myeloid leukemia, BCR/ABL-positive, not having achieved remission: Secondary | ICD-10-CM

## 2014-10-14 MED ORDER — IMATINIB MESYLATE 400 MG PO TABS: 400.0000 mg | ORAL_TABLET | Freq: Every day | ORAL | Status: DC

## 2014-10-14 NOTE — Telephone Encounter (Signed)
Reviewed rx refill with MD, Medication was not rescended by MD, appears this was sent to [facility] which recently merged with [facility]. Rx resent.

## 2014-10-14 NOTE — Telephone Encounter (Signed)
Pt called. When he tried to get his gleevec refilled the pharmacy, [facility], stated the doctor had rescinded the prescription. No note of this is in epic. Pt has about 15 gleevec tablets left.

## 2014-10-14 NOTE — Addendum Note (Signed)
Addended by: Lucile Crater on: 10/14/2014 04:40 PM   Modules accepted: Orders

## 2014-11-04 ENCOUNTER — Encounter: Payer: Self-pay | Admitting: Physician Assistant

## 2014-11-04 ENCOUNTER — Ambulatory Visit (HOSPITAL_BASED_OUTPATIENT_CLINIC_OR_DEPARTMENT_OTHER): Payer: 59 | Admitting: Physician Assistant

## 2014-11-04 ENCOUNTER — Other Ambulatory Visit (HOSPITAL_BASED_OUTPATIENT_CLINIC_OR_DEPARTMENT_OTHER): Payer: 59

## 2014-11-04 ENCOUNTER — Telehealth: Payer: Self-pay | Admitting: Internal Medicine

## 2014-11-04 VITALS — BP 104/70 | HR 78 | Temp 98.5°F | Resp 18 | Ht 67.0 in | Wt 176.8 lb

## 2014-11-04 DIAGNOSIS — C9210 Chronic myeloid leukemia, BCR/ABL-positive, not having achieved remission: Secondary | ICD-10-CM | POA: Diagnosis not present

## 2014-11-04 DIAGNOSIS — Z8546 Personal history of malignant neoplasm of prostate: Secondary | ICD-10-CM | POA: Diagnosis not present

## 2014-11-04 LAB — CBC WITH DIFFERENTIAL/PLATELET
BASO%: 0.3 % (ref 0.0–2.0)
BASOS ABS: 0 10*3/uL (ref 0.0–0.1)
EOS%: 1.9 % (ref 0.0–7.0)
Eosinophils Absolute: 0.1 10*3/uL (ref 0.0–0.5)
HEMATOCRIT: 35.8 % — AB (ref 38.4–49.9)
HEMOGLOBIN: 12.5 g/dL — AB (ref 13.0–17.1)
LYMPH#: 2.1 10*3/uL (ref 0.9–3.3)
LYMPH%: 36.1 % (ref 14.0–49.0)
MCH: 33.8 pg — ABNORMAL HIGH (ref 27.2–33.4)
MCHC: 34.9 g/dL (ref 32.0–36.0)
MCV: 96.8 fL (ref 79.3–98.0)
MONO#: 0.8 10*3/uL (ref 0.1–0.9)
MONO%: 13.2 % (ref 0.0–14.0)
NEUT#: 2.9 10*3/uL (ref 1.5–6.5)
NEUT%: 48.5 % (ref 39.0–75.0)
Platelets: 190 10*3/uL (ref 140–400)
RBC: 3.7 10*6/uL — ABNORMAL LOW (ref 4.20–5.82)
RDW: 13.5 % (ref 11.0–14.6)
WBC: 5.9 10*3/uL (ref 4.0–10.3)

## 2014-11-04 LAB — COMPREHENSIVE METABOLIC PANEL (CC13)
ALBUMIN: 3.9 g/dL (ref 3.5–5.0)
ALK PHOS: 48 U/L (ref 40–150)
ALT: 8 U/L (ref 0–55)
AST: 14 U/L (ref 5–34)
Anion Gap: 6 mEq/L (ref 3–11)
BILIRUBIN TOTAL: 0.23 mg/dL (ref 0.20–1.20)
BUN: 11.8 mg/dL (ref 7.0–26.0)
CO2: 26 mEq/L (ref 22–29)
CREATININE: 1.1 mg/dL (ref 0.7–1.3)
Calcium: 9 mg/dL (ref 8.4–10.4)
Chloride: 109 mEq/L (ref 98–109)
EGFR: 83 mL/min/{1.73_m2} — ABNORMAL LOW (ref >90)
Glucose: 98 mg/dl (ref 70–140)
Potassium: 4.3 mEq/L (ref 3.5–5.1)
Sodium: 141 mEq/L (ref 136–145)
TOTAL PROTEIN: 6.4 g/dL (ref 6.4–8.3)

## 2014-11-04 LAB — LACTATE DEHYDROGENASE (CC13): LDH: 163 U/L (ref 125–245)

## 2014-11-04 NOTE — Patient Instructions (Signed)
Continue Gleevec 400 mg by mouth daily Follow-up in 3 months with lab studies and occluding repeat molecular studies drawn 1 week prior to your appointment so that all results will be available

## 2014-11-04 NOTE — Telephone Encounter (Signed)
Made appt per pof....printer not working.Marland KitchenMarland Kitchen

## 2014-11-04 NOTE — Progress Notes (Addendum)
[name]   [phone]  OFFICE PROGRESS NOTE  [name], [place] [name] [address]  DIAGNOSIS:  1) Chronic myeloid leukemia diagnosed in November of 2014. 2) History of prostate adenocarcinoma diagnosed in 2006  PRIOR THERAPY: Status post prostatectomy.  CURRENT THERAPY: Gleevec 400 mg by mouth daily, started 02/10/2013.  INTERVAL HISTORY: [name] 59 y.o. male returns to the clinic today for followup visit. The patient is feeling fine today with no specific complaints except for occasional episodes of vomiting. He prefers not taking antiemetics. He notes some decrease in his appetite but his weight has remained stable. He reports occasional rash consisting of a small flat red areas that resolve on their own. He continues to tolerate his Gleevec at the current dose without difficulty. He has no chest pain or shortness of breath. He denied having any significant weight loss or night sweats. He is tolerating his treatment with Gleevec fairly well. He denied having any significant chest pain, shortness of breath, cough or hemoptysis. He has no change in bowel movement. The patient denied having any fever or chills. He has no significant weight loss or night sweats. He had repeat CBC, comprehensive metabolic panel, and LDH  performed recently and he is here for evaluation and discussion of his lab results.  MEDICAL HISTORY: Past Medical History  Diagnosis Date  . Myocardial infarction 02,6/12  . Hypertension   . Arthritis   . Cancer     hx prostate cancer  . Bladder disorder   . Coronary artery disease     with prior MI 2002([facility]) and Cfx(2006) and RCA (2004) stents . EF 45-50%  . Hyperlipidemia   . Coronary atherosclerosis of native coronary artery     stable and suspect a component of CAS  . Diverticulosis     seen on colonoscopy in 2008  . H/O colonoscopy 2014  . Prostate cancer      ALLERGIES:  is allergic to contrast media.  MEDICATIONS:  Current Outpatient Prescriptions  Medication Sig Dispense Refill  . Ascorbic Acid (VITAMIN C) 1000 MG tablet Take 1,000 mg by mouth daily.    Marland Kitchen aspirin 81 MG chewable tablet Chew 162 mg by mouth daily.     Marland Kitchen atorvastatin (LIPITOR) 10 MG tablet Take 1 tablet (10 mg total) by mouth daily. 90 tablet 1  . cholecalciferol (VITAMIN D) 1000 UNITS tablet Take 5,000 Units by mouth daily.     . cyclobenzaprine (FLEXERIL) 10 MG tablet TAKE 1 TABLET BY ORAL ROUTE EVERY DAY AT BEDTIME AS NEEDED  1  . darifenacin (ENABLEX) 15 MG 24 hr tablet Take 15 mg by mouth daily.     Marland Kitchen dexlansoprazole (DEXILANT) 60 MG capsule Take 60 mg by mouth daily.    . hydrOXYzine (ATARAX/VISTARIL) 25 MG tablet Take 50 mg by mouth daily.    Marland Kitchen imatinib (GLEEVEC) 400 MG tablet Take 1 tablet (400 mg total) by mouth daily. Take with meals and large glass of water.Caution:Chemotherapy. 90 tablet 1  . isosorbide mononitrate (IMDUR) 60 MG 24 hr tablet Take 1 tablet (60 mg total) by mouth daily. 30 tablet 5  . metoprolol succinate (TOPROL-XL) 100 MG 24 hr tablet Take 1 tablet (100 mg total) by mouth daily. Take with or immediately following a meal. 30 tablet 5  . Mirabegron ER (MYRBETRIQ) 25 MG TB24 Take 25 mg by mouth daily.    . nitroGLYCERIN (NITROLINGUAL) 0.4 MG/SPRAY spray  Place 1 spray under the tongue every 5 (five) minutes x 3 doses as needed for chest pain. 12 g 2  . prochlorperazine (COMPAZINE) 10 MG tablet Take 1 tablet (10 mg total) by mouth every 6 (six) hours as needed for nausea or vomiting. 30 tablet 3  . traMADol (ULTRAM) 50 MG tablet Take 50 mg by mouth every 6 (six) hours.  0  . valsartan (DIOVAN) 40 MG tablet Take 1 tablet by mouth  daily 90 tablet 1   No current facility-administered medications for this visit.    SURGICAL HISTORY:  Past Surgical History  Procedure Laterality Date  . Carotid stent    . Tonsillectomy    . Fracture surgery      rt  wrist  . Toe surgery      rt great toe  . Wrist surgery      rt and lt cysts removed  . Orif ankle fracture  07/04/2011    Procedure: OPEN REDUCTION INTERNAL FIXATION (ORIF) ANKLE FRACTURE;  Surgeon: Alta Corning, MD;  Location: [facility];  Service: Orthopedics;  Laterality: Right;  . Cardiac catheterization      normal EF, patent stents without obstructive disease (on Plavix X 1 month only)  . Left heart catheterization with coronary angiogram N/A 02/04/2012    Procedure: LEFT HEART CATHETERIZATION WITH CORONARY ANGIOGRAM;  Surgeon: Sinclair Grooms, MD;  Location: [facility] CATH LAB;  Service: Cardiovascular;  Laterality: N/A;    REVIEW OF SYSTEMS:  Constitutional: negative Eyes: negative Ears, nose, mouth, throat, and face: negative Respiratory: negative Cardiovascular: negative Gastrointestinal: positive for vomiting Genitourinary:negative Integument/breast: positive for rash and Self-limiting and spontaneously resolves on its own Hematologic/lymphatic: negative Musculoskeletal:negative Neurological: negative Behavioral/Psych: negative Endocrine: negative Allergic/Immunologic: negative   PHYSICAL EXAMINATION: General appearance: alert, cooperative and no distress Head: Normocephalic, without obvious abnormality, atraumatic Neck: no adenopathy, no JVD, supple, symmetrical, trachea midline and thyroid not enlarged, symmetric, no tenderness/mass/nodules Lymph nodes: Cervical, supraclavicular, and axillary nodes normal. Resp: clear to auscultation bilaterally Back: symmetric, no curvature. ROM normal. No CVA tenderness. Cardio: regular rate and rhythm, S1, S2 normal, no murmur, click, rub or gallop GI: soft, non-tender; bowel sounds normal; no masses,  no organomegaly Extremities: extremities normal, atraumatic, no cyanosis or edema Neurologic: Alert and oriented X 3, normal strength and tone. Normal symmetric reflexes. Normal coordination and gait    ECOG PERFORMANCE  STATUS: 0 - Asymptomatic  Blood pressure 104/70, pulse 78, temperature 98.5 F (36.9 C), temperature source Oral, resp. rate 18, height 5\' 7"  (1.702 m), weight 176 lb 12.8 oz (80.196 kg), SpO2 100 %.  LABORATORY DATA: Lab Results  Component Value Date   WBC 5.9 11/04/2014   HGB 12.5* 11/04/2014   HCT 35.8* 11/04/2014   MCV 96.8 11/04/2014   PLT 190 11/04/2014      Chemistry      Component Value Date/Time   NA 142 08/12/2014 0820   NA 140 12/13/2013 1045   K 3.8 08/12/2014 0820   K 4.3 12/13/2013 1045   CL 105 12/13/2013 1045   CO2 26 08/12/2014 0820   CO2 26 12/13/2013 1045   BUN 12.1 08/12/2014 0820   BUN 12 12/13/2013 1045   CREATININE 1.1 08/12/2014 0820   CREATININE 1.16 12/13/2013 1045      Component Value Date/Time   CALCIUM 8.6 08/12/2014 0820   CALCIUM 8.6 12/13/2013 1045   ALKPHOS 46 08/12/2014 0820   ALKPHOS 43 12/13/2013 1045   AST 14 08/12/2014 0820  AST 19 12/13/2013 1045   ALT 7 08/12/2014 0820   ALT 12 12/13/2013 1045   BILITOT 0.37 08/12/2014 0820   BILITOT <0.2* 12/13/2013 1045       RADIOGRAPHIC STUDIES: No results found.  ASSESSMENT AND PLAN: This is a very pleasant 59 years old [place] American male recently diagnosed with chronic myeloid leukemia. He is currently on treatment with Gleevec 400 mg by mouth daily and tolerating it fairly well. His CBC today showed a stable white blood count as well as a stable hemoglobin hematocrit and platelets count. I discussed the lab result with the patient today. Patient was discussed with and also seen by Dr. [name]. We recommended for him to continue his current treatment with Gleevec 400 mg by mouth daily. He return for a follow-up visit in 3 months with repeat CBC, comprehensive metabolic panel and LDH as well as BCR/ABL to reevaluate his molecular studies. These labs will be drawn approximately one week prior to his follow-up appointment so that all results are available. He was advised to call  immediately if he has any concerning symptoms in the interval. The patient voices understanding of current disease status and treatment options and is in agreement with the current care plan.  All questions were answered. The patient knows to call the clinic with any problems, questions or concerns. We can certainly see the patient much sooner if necessary.  Carlton Adam, PA-C 11/04/2014   ADDENDUM: Hematology/Oncology Attending: I had a face to face encounter with the patient. I recommended his care plan. This is a very pleasant 59 years old African-American male with chronic myeloid leukemia and currently undergoing treatment with Gleevec 400 mg by mouth daily since December 2014. The patient has been doing fine and tolerating his treatment well with no significant adverse effects. His CBC today showed normal white blood count with mild anemia. I recommended for him to continue his current treatment with Gleevec. He would come back for follow-up visit in 3 months for evaluation after repeating CBC, complaints metabolic panel, LDH and molecular study for BCR/ABL. The patient was advised to call immediately if he has any concerning symptoms in the interval.  Disclaimer: This note was dictated with voice recognition software. Similar sounding words can inadvertently be transcribed and may be missed upon review. Eilleen Kempf., MD 11/04/2014

## 2014-11-08 ENCOUNTER — Other Ambulatory Visit: Payer: Self-pay | Admitting: Interventional Cardiology

## 2014-12-07 ENCOUNTER — Other Ambulatory Visit: Payer: Self-pay | Admitting: Interventional Cardiology

## 2015-01-04 ENCOUNTER — Other Ambulatory Visit: Payer: Self-pay | Admitting: Interventional Cardiology

## 2015-02-01 ENCOUNTER — Other Ambulatory Visit: Payer: Self-pay | Admitting: Interventional Cardiology

## 2015-02-02 ENCOUNTER — Other Ambulatory Visit: Payer: Self-pay | Admitting: *Deleted

## 2015-02-02 DIAGNOSIS — C9210 Chronic myeloid leukemia, BCR/ABL-positive, not having achieved remission: Secondary | ICD-10-CM

## 2015-02-03 ENCOUNTER — Other Ambulatory Visit (HOSPITAL_BASED_OUTPATIENT_CLINIC_OR_DEPARTMENT_OTHER): Payer: 59

## 2015-02-03 DIAGNOSIS — C9210 Chronic myeloid leukemia, BCR/ABL-positive, not having achieved remission: Secondary | ICD-10-CM

## 2015-02-03 LAB — CBC WITH DIFFERENTIAL/PLATELET
BASO%: 0.5 % (ref 0.0–2.0)
Basophils Absolute: 0 10*3/uL (ref 0.0–0.1)
EOS%: 1.5 % (ref 0.0–7.0)
Eosinophils Absolute: 0.1 10*3/uL (ref 0.0–0.5)
HEMATOCRIT: 38.1 % — AB (ref 38.4–49.9)
HEMOGLOBIN: 13 g/dL (ref 13.0–17.1)
LYMPH#: 2 10*3/uL (ref 0.9–3.3)
LYMPH%: 34.1 % (ref 14.0–49.0)
MCH: 32.8 pg (ref 27.2–33.4)
MCHC: 34.1 g/dL (ref 32.0–36.0)
MCV: 96.2 fL (ref 79.3–98.0)
MONO#: 0.7 10*3/uL (ref 0.1–0.9)
MONO%: 11.5 % (ref 0.0–14.0)
NEUT#: 3.1 10*3/uL (ref 1.5–6.5)
NEUT%: 52.4 % (ref 39.0–75.0)
Platelets: 232 10*3/uL (ref 140–400)
RBC: 3.96 10*6/uL — ABNORMAL LOW (ref 4.20–5.82)
RDW: 14 % (ref 11.0–14.6)
WBC: 5.9 10*3/uL (ref 4.0–10.3)

## 2015-02-03 LAB — COMPREHENSIVE METABOLIC PANEL (CC13)
ALBUMIN: 3.8 g/dL (ref 3.5–5.0)
ALK PHOS: 58 U/L (ref 40–150)
ALT: 11 U/L (ref 0–55)
AST: 16 U/L (ref 5–34)
Anion Gap: 6 mEq/L (ref 3–11)
BUN: 14.2 mg/dL (ref 7.0–26.0)
CALCIUM: 9.2 mg/dL (ref 8.4–10.4)
CHLORIDE: 109 meq/L (ref 98–109)
CO2: 25 mEq/L (ref 22–29)
CREATININE: 1.1 mg/dL (ref 0.7–1.3)
EGFR: 85 mL/min/{1.73_m2} — ABNORMAL LOW (ref >90)
GLUCOSE: 105 mg/dL (ref 70–140)
POTASSIUM: 4.2 meq/L (ref 3.5–5.1)
SODIUM: 139 meq/L (ref 136–145)
Total Bilirubin: <0.3 mg/dL (ref 0.20–1.20)
Total Protein: 6.6 g/dL (ref 6.4–8.3)

## 2015-02-10 ENCOUNTER — Telehealth: Payer: Self-pay | Admitting: Internal Medicine

## 2015-02-10 ENCOUNTER — Other Ambulatory Visit: Payer: 59

## 2015-02-10 ENCOUNTER — Ambulatory Visit (HOSPITAL_BASED_OUTPATIENT_CLINIC_OR_DEPARTMENT_OTHER): Payer: 59 | Admitting: Internal Medicine

## 2015-02-10 ENCOUNTER — Encounter: Payer: Self-pay | Admitting: Internal Medicine

## 2015-02-10 VITALS — BP 107/69 | HR 82 | Temp 98.8°F | Resp 17 | Ht 67.0 in | Wt 176.3 lb

## 2015-02-10 DIAGNOSIS — C9210 Chronic myeloid leukemia, BCR/ABL-positive, not having achieved remission: Secondary | ICD-10-CM | POA: Diagnosis not present

## 2015-02-10 DIAGNOSIS — Z8546 Personal history of malignant neoplasm of prostate: Secondary | ICD-10-CM

## 2015-02-10 NOTE — Telephone Encounter (Signed)
per pof to sch pt appt-gave pt copy of avs °

## 2015-02-10 NOTE — Progress Notes (Signed)
[place] [phone]   [phone]  OFFICE PROGRESS NOTE  [name], [place] [address] [address]  DIAGNOSIS:  1) Chronic myeloid leukemia diagnosed in November of 2014. 2) History of prostate adenocarcinoma diagnosed in 2006  PRIOR THERAPY: Status post prostatectomy.  CURRENT THERAPY: Gleevec 400 mg by mouth daily, started 02/10/2013.  INTERVAL HISTORY: REZNOR BANKA 59 y.o. male returns to the clinic today for followup visit. The patient is feeling fine today with no specific complaints except cold symptoms for the last few weeks. He was seen by his primary care physician and was given prescription for amoxicillin. The patient missed a few days of his treatment with Gleevec during this time. He has no chest pain or shortness of breath. He denied having any significant weight loss or night sweats. He is tolerating his treatment with Gleevec fairly well. He denied having any significant chest pain, shortness of breath, cough or hemoptysis. He has no vomiting or change in bowel movement. The patient denied having any fever or chills. He has no significant weight loss or night sweats. He has repeat CBC, comprehensive metabolic panel, and LDH  performed recently and he is here for evaluation and discussion of his lab results.  MEDICAL HISTORY: Past Medical History  Diagnosis Date  . Myocardial infarction ([place]) 02,6/12  . Hypertension   . Arthritis   . Cancer     hx prostate cancer  . Bladder disorder   . Coronary artery disease     with prior MI 2002(BMS) and Cfx(2006) and RCA (2004) stents . EF 45-50%  . Hyperlipidemia   . Coronary atherosclerosis of native coronary artery     stable and suspect a component of CAS  . Diverticulosis     seen on colonoscopy in 2008  . H/O colonoscopy 2014  . Prostate cancer     ALLERGIES:  is allergic to contrast media.  MEDICATIONS:  Current Outpatient Prescriptions    Medication Sig Dispense Refill  . Ascorbic Acid (VITAMIN C) 1000 MG tablet Take 1,000 mg by mouth daily.    Marland Kitchen aspirin 81 MG chewable tablet Chew 162 mg by mouth daily.     Marland Kitchen atorvastatin (LIPITOR) 10 MG tablet Take 1 tablet (10 mg total) by mouth daily. 90 tablet 1  . cholecalciferol (VITAMIN D) 1000 UNITS tablet Take 5,000 Units by mouth daily.     . cyclobenzaprine (FLEXERIL) 10 MG tablet TAKE 1 TABLET BY ORAL ROUTE EVERY DAY AT BEDTIME AS NEEDED  1  . darifenacin (ENABLEX) 15 MG 24 hr tablet Take 15 mg by mouth daily.     Marland Kitchen dexlansoprazole (DEXILANT) 60 MG capsule Take 60 mg by mouth daily.    . hydrOXYzine (ATARAX/VISTARIL) 25 MG tablet Take 50 mg by mouth daily.    Marland Kitchen imatinib (GLEEVEC) 400 MG tablet Take 1 tablet (400 mg total) by mouth daily. Take with meals and large glass of water.Caution:Chemotherapy. 90 tablet 1  . isosorbide mononitrate (IMDUR) 60 MG 24 hr tablet TAKE ONE TABLET BY MOUTH ONCE DAILY 30 tablet 0  . metoprolol succinate (TOPROL-XL) 100 MG 24 hr tablet TAKE ONE TABLET BY MOUTH ONCE DAILY WITH MEALS OR IMMEDIALTELY FOLLOWING MEALS. 30 tablet 0  . Mirabegron ER (MYRBETRIQ) 25 MG TB24 Take 25 mg by mouth daily.    . nitroGLYCERIN (NITROLINGUAL) 0.4 MG/SPRAY spray Place 1 spray under the tongue every 5 (five) minutes x 3 doses as needed for chest  pain. 12 g 2  . prochlorperazine (COMPAZINE) 10 MG tablet Take 1 tablet (10 mg total) by mouth every 6 (six) hours as needed for nausea or vomiting. 30 tablet 3  . traMADol (ULTRAM) 50 MG tablet Take 50 mg by mouth every 6 (six) hours.  0  . valsartan (DIOVAN) 40 MG tablet TAKE ONE TABLET BY MOUTH ONCE DAILY 60 tablet 1   No current facility-administered medications for this visit.    SURGICAL HISTORY:  Past Surgical History  Procedure Laterality Date  . Carotid stent    . Tonsillectomy    . Fracture surgery      rt wrist  . Toe surgery      rt great toe  . Wrist surgery      rt and lt cysts removed  . Orif ankle  fracture  07/04/2011    Procedure: OPEN REDUCTION INTERNAL FIXATION (ORIF) ANKLE FRACTURE;  Surgeon: Alta Corning, MD;  Location: [facility];  Service: Orthopedics;  Laterality: Right;  . Cardiac catheterization      normal EF, patent stents without obstructive disease (on Plavix X 1 month only)  . Left heart catheterization with coronary angiogram N/A 02/04/2012    Procedure: LEFT HEART CATHETERIZATION WITH CORONARY ANGIOGRAM;  Surgeon: Sinclair Grooms, MD;  Location: [facility] CATH LAB;  Service: Cardiovascular;  Laterality: N/A;    REVIEW OF SYSTEMS:  A comprehensive review of systems was negative except for: Respiratory: positive for cough   PHYSICAL EXAMINATION: General appearance: alert, cooperative and no distress Head: Normocephalic, without obvious abnormality, atraumatic Neck: no adenopathy, no JVD, supple, symmetrical, trachea midline and thyroid not enlarged, symmetric, no tenderness/mass/nodules Lymph nodes: Cervical, supraclavicular, and axillary nodes normal. Resp: clear to auscultation bilaterally Back: symmetric, no curvature. ROM normal. No CVA tenderness. Cardio: regular rate and rhythm, S1, S2 normal, no murmur, click, rub or gallop GI: soft, non-tender; bowel sounds normal; no masses,  no organomegaly Extremities: extremities normal, atraumatic, no cyanosis or edema Neurologic: Alert and oriented X 3, normal strength and tone. Normal symmetric reflexes. Normal coordination and gait  Skin exam: small boil in the left axilla.  ECOG PERFORMANCE STATUS: 0 - Asymptomatic  Blood pressure 107/69, pulse 82, temperature 98.8 F (37.1 C), temperature source Oral, resp. rate 17, height 5\' 7"  (1.702 m), weight 176 lb 4.8 oz (79.969 kg), SpO2 100 %.  LABORATORY DATA: Lab Results  Component Value Date   WBC 5.9 02/03/2015   HGB 13.0 02/03/2015   HCT 38.1* 02/03/2015   MCV 96.2 02/03/2015   PLT 232 02/03/2015      Chemistry      Component Value Date/Time   NA 139  02/03/2015 0845   NA 140 12/13/2013 1045   K 4.2 02/03/2015 0845   K 4.3 12/13/2013 1045   CL 105 12/13/2013 1045   CO2 25 02/03/2015 0845   CO2 26 12/13/2013 1045   BUN 14.2 02/03/2015 0845   BUN 12 12/13/2013 1045   CREATININE 1.1 02/03/2015 0845   CREATININE 1.16 12/13/2013 1045      Component Value Date/Time   CALCIUM 9.2 02/03/2015 0845   CALCIUM 8.6 12/13/2013 1045   ALKPHOS 58 02/03/2015 0845   ALKPHOS 43 12/13/2013 1045   AST 16 02/03/2015 0845   AST 19 12/13/2013 1045   ALT 11 02/03/2015 0845   ALT 12 12/13/2013 1045   BILITOT <0.30 02/03/2015 0845   BILITOT <0.2* 12/13/2013 1045       RADIOGRAPHIC STUDIES: No results  found.  ASSESSMENT AND PLAN: This is a very pleasant 59 years old [place] American male recently diagnosed with chronic myeloid leukemia. He is currently on treatment with Gleevec 400 mg by mouth daily and tolerating it fairly well. His CBC is unremarkable. I discussed the lab result with the patient today.  I recommended for him to continue his current treatment with Gleevec 400 mg by mouth daily. He would come back for follow-up visit in 3 months with repeat CBC, comprehensive metabolic panel, LDH and molecular study for BCR/ABL He was advised to call immediately if he has any concerning symptoms in the interval. The patient voices understanding of current disease status and treatment options and is in agreement with the current care plan.  All questions were answered. The patient knows to call the clinic with any problems, questions or concerns. We can certainly see the patient much sooner if necessary.  Disclaimer: This note was dictated with voice recognition software. Similar sounding words can inadvertently be transcribed and may not be corrected upon review.

## 2015-02-18 ENCOUNTER — Ambulatory Visit (INDEPENDENT_AMBULATORY_CARE_PROVIDER_SITE_OTHER): Payer: 59 | Admitting: Interventional Cardiology

## 2015-02-18 ENCOUNTER — Encounter: Payer: Self-pay | Admitting: Interventional Cardiology

## 2015-02-18 VITALS — BP 114/84 | HR 63 | Ht 67.0 in | Wt 179.4 lb

## 2015-02-18 DIAGNOSIS — E785 Hyperlipidemia, unspecified: Secondary | ICD-10-CM | POA: Diagnosis not present

## 2015-02-18 DIAGNOSIS — I10 Essential (primary) hypertension: Secondary | ICD-10-CM | POA: Diagnosis not present

## 2015-02-18 DIAGNOSIS — I2510 Atherosclerotic heart disease of native coronary artery without angina pectoris: Secondary | ICD-10-CM | POA: Diagnosis not present

## 2015-02-18 NOTE — Patient Instructions (Addendum)
Medication Instructions:  Your physician has recommended you make the following change in your medication:  1.  STOP the Lipitor for 1 month then restart it.  Call and let us know if the muscular aches go away.[name]: NONE ORDERED  Testing/Procedures: NONE ORDERED  Follow-Up: Your physician wants you to follow-up in:  [place].  You will receive a reminder letter in the mail two months in advance. If you don't receive a letter, please call our office to schedule the follow-up appointment.   Any Other Special Instructions Will Be Listed Below (If Applicable). Your physician discussed the importance of regular exercise and recommended that you start or continue a regular exercise program for good health and increase your walking.    If you need a refill on your cardiac medications before your next appointment, please call your pharmacy.

## 2015-02-18 NOTE — Progress Notes (Signed)
Cardiology Office Note   Date:  02/18/2015   ID:  [name], DOB February 20, 1956, MRN [id number]  PCP:  Charles Greenland, MD  Cardiologist:  Charles Grooms, MD   Chief Complaint  Patient presents with  . Coronary Artery Disease      History of Present Illness: [name] is a 59 y.o. male who presents for CAD, hypertension, hyperlipidemia,. Other significant problems include CML, history of prostate cancer, and arthritis.  [name] has been having stiffness in his hands and musculoskeletal pain. He feels that [name] for CML is causing some side effects. He wonders if anything else on his med list could be contributing.  He has occasional chest discomfort. He has stopped biking. His job requires long hours and sleepless nights. 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 Much of this occurs after hours.  Past Medical History  Diagnosis Date  . Myocardial infarction ([place]) 02,6/12  . Hypertension   . Arthritis   . Cancer ([place])     hx prostate cancer  . Bladder disorder   . Coronary artery disease     with prior MI 2002([facility]) and Cfx(2006) and RCA (2004) stents . EF 45-50%  . Hyperlipidemia   . Coronary atherosclerosis of native coronary artery     stable and suspect a component of CAS  . Diverticulosis     seen on colonoscopy in 2008  . H/O colonoscopy 2014  . Prostate cancer [facility])     Past Surgical History  Procedure Laterality Date  . Carotid stent    . Tonsillectomy    . Fracture surgery      rt wrist  . Toe surgery      rt great toe  . Wrist surgery      rt and lt cysts removed  . Orif ankle fracture  07/04/2011    Procedure: OPEN REDUCTION INTERNAL FIXATION (ORIF) ANKLE FRACTURE;  Surgeon: Alta Corning, MD;  Location: [facility];  Service: Orthopedics;  Laterality: Right;  . Cardiac catheterization      normal EF, patent stents without obstructive disease (on Plavix X 1 month  only)  . Left heart catheterization with coronary angiogram N/A 02/04/2012    Procedure: LEFT HEART CATHETERIZATION WITH CORONARY ANGIOGRAM;  Surgeon: Charles Grooms, MD;  Location: [facility] CATH LAB;  Service: Cardiovascular;  Laterality: N/A;     Current Outpatient Prescriptions  Medication Sig Dispense Refill  . Ascorbic Acid (VITAMIN C) 1000 MG tablet Take 1,000 mg by mouth daily.    Marland Kitchen aspirin 81 MG chewable tablet Chew 162 mg by mouth daily.     Marland Kitchen atorvastatin (LIPITOR) 10 MG tablet Take 1 tablet (10 mg total) by mouth daily. 90 tablet 1  . cholecalciferol (VITAMIN D) 1000 UNITS tablet Take 5,000 Units by mouth daily.     . cyclobenzaprine (FLEXERIL) 10 MG tablet TAKE 1 TABLET BY ORAL ROUTE EVERY DAY AT BEDTIME AS NEEDED FOR MUSCLE SPASMS  1  . darifenacin (ENABLEX) 15 MG 24 hr tablet Take 15 mg by mouth daily.     Marland Kitchen dexlansoprazole (DEXILANT) 60 MG capsule Take 60 mg by mouth daily.    . hydrOXYzine (ATARAX/VISTARIL) 25 MG tablet Take 50 mg by mouth daily.    Marland Kitchen imatinib (GLEEVEC) 400 MG tablet Take 1 tablet (400 mg total) by mouth daily. Take with meals and large glass of water.Caution:Chemotherapy. 90 tablet 1  . isosorbide mononitrate (IMDUR)  60 MG 24 hr tablet TAKE ONE TABLET BY MOUTH ONCE DAILY 30 tablet 0  . metoprolol succinate (TOPROL-XL) 100 MG 24 hr tablet TAKE ONE TABLET BY MOUTH ONCE DAILY WITH MEALS OR IMMEDIALTELY FOLLOWING MEALS. 30 tablet 0  . Mirabegron ER (MYRBETRIQ) 25 MG TB24 Take 25 mg by mouth daily.    . nitroGLYCERIN (NITROLINGUAL) 0.4 MG/SPRAY spray Place 1 spray under the tongue every 5 (five) minutes x 3 doses as needed for chest pain. 12 g 2  . prochlorperazine (COMPAZINE) 10 MG tablet Take 1 tablet (10 mg total) by mouth every 6 (six) hours as needed for nausea or vomiting. 30 tablet 3  . traMADol (ULTRAM) 50 MG tablet Take 50 mg by mouth every 6 (six) hours.  0  . valsartan (DIOVAN) 40 MG tablet TAKE ONE TABLET BY MOUTH ONCE DAILY 60 tablet 1   No current  facility-administered medications for this visit.    Allergies:   Contrast media    Social History:  The patient  reports that he quit smoking about 14 years ago. His smoking use included Cigarettes. He has never used smokeless tobacco. He reports that he does not drink alcohol or use illicit drugs.   Family History:  The patient's family history includes Cancer in his mother.    ROS:  Please see the history of present illness.   Otherwise, review of systems are positive for depression, fatigue, muscle aches and pains, nausea, and vomiting..   All other systems are reviewed and negative.    PHYSICAL EXAM: VS:  BP 114/84 mmHg  Pulse 63  Ht 5\' 7"  (1.702 m)  Wt 179 lb 6.4 oz (81.375 kg)  BMI 28.09 kg/m2 , BMI Body mass index is 28.09 kg/(m^2). GEN: Well nourished, well developed, in no acute distress HEENT: normal Neck: no JVD, carotid bruits, or masses Cardiac: RRR.  There is no murmur, rub, or gallop. There is no edema. Respiratory:  clear to auscultation bilaterally, normal work of breathing. GI: soft, nontender, nondistended, + BS MS: no deformity or atrophy Skin: warm and dry, no rash Neuro:  Strength and sensation are intact Psych: euthymic mood, full affect   EKG:  EKG is ordered today. The ekg reveals sinus bradycardia with nonspecific T-wave flattening   Recent Labs: 02/03/2015: ALT 11; BUN 14.2; Creatinine 1.1; HGB 13.0; Platelets 232; Potassium 4.2; Sodium 139    Lipid Panel    Component Value Date/Time   CHOL 124 08/28/2010 0355   TRIG 60 08/28/2010 0355   HDL 43 08/28/2010 0355   CHOLHDL 2.9 08/28/2010 0355   VLDL 12 08/28/2010 0355   LDLCALC 69 08/28/2010 0355      Wt Readings from Last 3 Encounters:  02/18/15 179 lb 6.4 oz (81.375 kg)  02/10/15 176 lb 4.8 oz (79.969 kg)  11/04/14 176 lb 12.8 oz (80.196 kg)      Other studies Reviewed: Additional studies/ records that were reviewed today include: none. The findings include none.    ASSESSMENT  AND PLAN:  1. Coronary artery disease involving native coronary artery of native heart without angina pectoris Stable with minimal angina - EKG 12-Lead  2. Essential hypertension Well controlled - EKG 12-Lead  3. Hyperlipidemia Followed by primary care    Current medicines are reviewed at length with the patient today.  The patient has the following concerns regarding medicines: Muscle aches and pains and with the medications could be contributing..  The following changes/actions have been instituted:    Discontinue atorvastatin for  one month and then resume. Analyze whether or not musculoskeletal complaints significantly improved during the statin free interval.  I encouraged Spenser to resume an active lifestyle with as much Cardio as possible  Labs/ tests ordered today include:  Orders Placed This Encounter  Procedures  . EKG 12-Lead     Disposition:   FU with HS in 1 year  Signed, Charles Grooms, MD  02/18/2015 8:54 AM    [facility] [place], [place], [place]  [address] Phone: [phone]; Fax: [phone]

## 2015-02-28 ENCOUNTER — Other Ambulatory Visit: Payer: Self-pay | Admitting: Interventional Cardiology

## 2015-03-29 ENCOUNTER — Telehealth: Payer: Self-pay | Admitting: Interventional Cardiology

## 2015-03-29 NOTE — Telephone Encounter (Signed)
New message     Pt states that Dr [name] is suppose to give him information

## 2015-03-30 ENCOUNTER — Ambulatory Visit (HOSPITAL_BASED_OUTPATIENT_CLINIC_OR_DEPARTMENT_OTHER)
Admission: RE | Admit: 2015-03-30 | Discharge: 2015-03-30 | Disposition: A | Discharge status: Home / Self Care | Payer: BLUE CROSS/BLUE SHIELD | Source: Ambulatory Visit | Attending: Family Medicine | Admitting: Family Medicine

## 2015-03-30 ENCOUNTER — Telehealth: Payer: Self-pay | Admitting: Internal Medicine

## 2015-03-30 ENCOUNTER — Encounter: Payer: Self-pay | Admitting: Family Medicine

## 2015-03-30 ENCOUNTER — Ambulatory Visit (INDEPENDENT_AMBULATORY_CARE_PROVIDER_SITE_OTHER): Payer: BLUE CROSS/BLUE SHIELD

## 2015-03-30 ENCOUNTER — Ambulatory Visit (INDEPENDENT_AMBULATORY_CARE_PROVIDER_SITE_OTHER): Payer: BLUE CROSS/BLUE SHIELD | Admitting: Family Medicine

## 2015-03-30 VITALS — BP 118/80 | HR 79 | Temp 98.2°F | Resp 18 | Ht 68.0 in | Wt 178.4 lb

## 2015-03-30 DIAGNOSIS — C9210 Chronic myeloid leukemia, BCR/ABL-positive, not having achieved remission: Secondary | ICD-10-CM | POA: Diagnosis not present

## 2015-03-30 DIAGNOSIS — M2550 Pain in unspecified joint: Secondary | ICD-10-CM

## 2015-03-30 DIAGNOSIS — M79604 Pain in right leg: Secondary | ICD-10-CM | POA: Insufficient documentation

## 2015-03-30 DIAGNOSIS — E118 Type 2 diabetes mellitus with unspecified complications: Secondary | ICD-10-CM | POA: Diagnosis not present

## 2015-03-30 DIAGNOSIS — I2510 Atherosclerotic heart disease of native coronary artery without angina pectoris: Secondary | ICD-10-CM | POA: Diagnosis not present

## 2015-03-30 DIAGNOSIS — R29898 Other symptoms and signs involving the musculoskeletal system: Secondary | ICD-10-CM

## 2015-03-30 MED ORDER — PREDNISONE 20 MG PO TABS: ORAL_TABLET | ORAL | Status: DC

## 2015-03-30 MED ORDER — KETOROLAC TROMETHAMINE 30 MG/ML IJ SOLN
30.0000 mg | Freq: Once | INTRAMUSCULAR | Status: AC
  Administered 2015-03-30: 30 mg via INTRAMUSCULAR

## 2015-03-30 MED ORDER — CYCLOBENZAPRINE HCL 10 MG PO TABS: 5.0000 mg | ORAL_TABLET | Freq: Three times a day (TID) | ORAL | Status: DC | PRN

## 2015-03-30 MED ORDER — OXYCODONE-ACETAMINOPHEN 5-325 MG PO TABS: 1.0000 | ORAL_TABLET | Freq: Three times a day (TID) | ORAL | Status: DC | PRN

## 2015-03-30 NOTE — Telephone Encounter (Signed)
Returned pt call. lmtcb Called to get more info from pt before fwd message to [place].

## 2015-03-30 NOTE — Telephone Encounter (Signed)
Left message for patient about appointment change from 3/8 to 3/7 due to provider will be out of the office. Schedule was sent via mail.

## 2015-03-30 NOTE — Telephone Encounter (Signed)
Spoke with pt. 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 Adv pt I will fwd Dr.Smith the message. Pt voiced appreciation.

## 2015-03-30 NOTE — Progress Notes (Addendum)
Subjective:  This chart was scribed for Delman Cheadle MD, by Tamsen Roers, at [facility].  This patient was seen in room 5 and the patient's care was started at 9:00 AM.    Patient ID: [name], male    DOB: [birth year], 60 y.o.   MRN: [id number] Chief Complaint  Patient presents with  . Back Pain    Lower back pain that starts at the tailbone and spreads down to the right calf. x1 day    HPI HPI Comments: [name] is a 60 y.o. male who presents to the [facility] complaining of "excrutiating" lower back pain which radiates down his right leg (down to his calf) onset yesterday.He has associated symptoms of weakness in his right leg.   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  He had a muscle relaxer Flexeril) as well as Motrin but denies any relief.  He also took Ibuprofen (400 mg) at 4 am this morning.  Patient turned to his left side and put pillows in between his legs to get in a comfortable position in order to sleep last night.  He has never had imaging on his back in the past.  He feels like he is now limping due to the pressure while walking.  Patients orthopedic physician is Dr. Rip Harbour. Denies any swelling in his legs.  Patient is complaint with all of his other medication.  He is currently a cancer patient (Chronic Myeloid Leukemia).    Patient is taking baby aspirin daily for anti platelet. Last Labs less than two months prior show normal renal function.  creatinine 1.1,  GFR 85.    Patient Active Problem List   Diagnosis Date Noted  . History of prostate cancer 04/21/2013  . Chronic myeloid leukemia ([place]) 01/26/2013  . Leukocytosis, unspecified 01/06/2013  . Arthritis   . Bladder disorder   . Coronary artery disease   . Hyperlipidemia   . Coronary atherosclerosis of native coronary artery   . Diverticulosis   . ACS (acute coronary syndrome) ([place]) 02/04/2012  . Essential hypertension  02/04/2012    Class: Chronic  . Hyperlipidemia LDL goal < 70 02/04/2012    Class: Chronic  . Diabetes mellitus ([place]) 02/04/2012    Class: Chronic  . Precordial chest pain 02/03/2012   Past Medical History  Diagnosis Date  . Myocardial infarction ([place]) 02,6/12  . Hypertension   . Arthritis   . Cancer ([place])     hx prostate cancer  . Bladder disorder   . Coronary artery disease     with prior MI 2002(BMS) and Cfx(2006) and RCA (2004) stents . EF 45-50%  . Hyperlipidemia   . Coronary atherosclerosis of native coronary artery     stable and suspect a component of CAS  . Diverticulosis     seen on colonoscopy in 2008  . H/O colonoscopy 2014  . Prostate cancer [facility])    Past Surgical History  Procedure Laterality Date  . Carotid stent    . Tonsillectomy    . Fracture surgery      rt wrist  . Toe surgery      rt great toe  . Wrist surgery      rt and lt cysts removed  . Orif ankle fracture  07/04/2011    Procedure: OPEN REDUCTION INTERNAL FIXATION (ORIF) ANKLE FRACTURE;  Surgeon: Alta Corning, MD;  Location: [place];  Service: Orthopedics;  Laterality:  Right;  . Cardiac catheterization      normal EF, patent stents without obstructive disease (on Plavix X 1 month only)  . Left heart catheterization with coronary angiogram N/A 02/04/2012    Procedure: LEFT HEART CATHETERIZATION WITH CORONARY ANGIOGRAM;  Surgeon: Sinclair Grooms, MD;  Location: [facility] CATH LAB;  Service: Cardiovascular;  Laterality: N/A;   Allergies  Allergen Reactions  . Contrast Media [Iodinated Diagnostic Agents] Other (See Comments)    Reaction unknown   Prior to Admission medications   Medication Sig Start Date End Date Taking? Authorizing Provider  Ascorbic Acid (VITAMIN C) 1000 MG tablet Take 1,000 mg by mouth daily.   Yes Historical Provider, MD  aspirin 81 MG chewable tablet Chew 162 mg by mouth daily.    Yes Historical Provider, MD  cholecalciferol (VITAMIN D) 1000 UNITS tablet Take  5,000 Units by mouth daily.    Yes Historical Provider, MD  cyclobenzaprine (FLEXERIL) 10 MG tablet TAKE 1 TABLET BY ORAL ROUTE EVERY DAY AT BEDTIME AS NEEDED FOR MUSCLE SPASMS 07/20/14  Yes Historical Provider, MD  darifenacin (ENABLEX) 15 MG 24 hr tablet Take 15 mg by mouth daily.    Yes Historical Provider, MD  dexlansoprazole (DEXILANT) 60 MG capsule Take 60 mg by mouth daily.   Yes Historical Provider, MD  hydrOXYzine (ATARAX/VISTARIL) 25 MG tablet Take 50 mg by mouth daily.   Yes Historical Provider, MD  imatinib (GLEEVEC) 400 MG tablet Take 1 tablet (400 mg total) by mouth daily. Take with meals and large glass of water.Caution:Chemotherapy. 10/14/14  Yes Curt Bears, MD  isosorbide mononitrate (IMDUR) 60 MG 24 hr tablet TAKE ONE TABLET BY MOUTH ONCE DAILY 03/02/15  Yes Belva Crome, MD  metoprolol succinate (TOPROL-XL) 100 MG 24 hr tablet TAKE ONE TABLET BY MOUTH ONCE DAILY WITH  MEALS  OR  IMMEDIATELY  FOLLOWING  MEALS 03/02/15  Yes Belva Crome, MD  Mirabegron ER (MYRBETRIQ) 25 MG TB24 Take 25 mg by mouth daily.   Yes Historical Provider, MD  nitroGLYCERIN (NITROLINGUAL) 0.4 MG/SPRAY spray Place 1 spray under the tongue every 5 (five) minutes x 3 doses as needed for chest pain. 12/25/13  Yes Belva Crome, MD  prochlorperazine (COMPAZINE) 10 MG tablet Take 1 tablet (10 mg total) by mouth every 6 (six) hours as needed for nausea or vomiting. 01/26/14  Yes Curt Bears, MD  traMADol (ULTRAM) 50 MG tablet Take 50 mg by mouth every 6 (six) hours. 05/14/14  Yes Historical Provider, MD  valsartan (DIOVAN) 40 MG tablet TAKE ONE TABLET BY MOUTH ONCE DAILY 01/04/15  Yes Belva Crome, MD   Social History   Social History  . Marital Status: Married    Spouse Name: N/A  . Number of Children: N/A  . Years of Education: N/A   Occupational History  . Not on file.   Social History Main Topics  . Smoking status: Former Smoker    Types: Cigarettes    Quit date: 06/06/2000  . Smokeless  tobacco: Never Used     Comment: 2002  . Alcohol Use: No     Comment: rare  . Drug Use: No  . Sexual Activity: Not on file   Other Topics Concern  . Not on file   Social History Narrative       Review of Systems  Constitutional: Negative for fever and chills.  Eyes: Negative for pain, redness and itching.  Respiratory: Negative for cough, choking and shortness of breath.  Gastrointestinal: Negative for nausea and vomiting.  Musculoskeletal: Positive for back pain and gait problem. Negative for neck pain and neck stiffness.  Neurological: Positive for weakness. Negative for speech difficulty.       Objective:   Physical Exam  Constitutional: He is oriented to person, place, and time. No distress.  HENT:  Head: Normocephalic and atraumatic.  Eyes: Pupils are equal, round, and reactive to light.  Pulmonary/Chest: No respiratory distress.  Musculoskeletal:  No tenderness over the c-spine or upper sacrum.  No lumbar paraspinal muscle spasm Tenderness over bilaterally SI joint but left greater than right.  pain is rather severe.  Hugely positive straight leg raise on the right.  5/5 hip flexors.  5/5 quads but 4+/5 hamstring on the right.  5/5 on left.  4+/5 dorsal flexion right.  5/5 on left.  Tenderness to palpation along the calf and popliteal fossa.  Equal calf measurements. - No lower extremity edema bilaterally.  Neurological: He is alert and oriented to person, place, and time.  Reflex Scores:      Patellar reflexes are 2+ on the right side and 2+ on the left side.      Achilles reflexes are 2+ on the right side and 2+ on the left side.    Skin: Skin is warm and dry.  Psychiatric: He has a normal mood and affect. His behavior is normal.    Filed Vitals:   03/30/15 0841  BP: 118/80  Pulse: 79  Temp: 98.2 F (36.8 C)  TempSrc: Oral  Resp: 18  Height: 5\' 8"  (1.727 m)  Weight: 178 lb 6.4 oz (80.922 kg)  SpO2: 96%    Dg Lumbar Spine  Complete  03/30/2015  CLINICAL DATA:  60 year old male with lumbosacral back pain radiating down the right lower extremity, acute onset yesterday. Initial encounter. EXAM: LUMBAR SPINE - COMPLETE 4+ VIEW COMPARISON:  SI joints from today reported separately. Abdominal radiographs 04/05/2009. CT Abdomen and Pelvis 08/24/2008. FINDINGS: Normal lumbar segmentation. Normal vertebral height and alignment. Relatively preserved and stable disc spaces. Stable mild L5-S1 facet hypertrophy, endplate spurring, and endplate sclerosis (image 3). No pars fracture. Stable mild endplate spurring at 075-GRM. Visible lower thoracic levels appear intact. Mild Aortoiliac calcified atherosclerosis noted. IMPRESSION: 1. No acute osseous abnormality identified in the lumbar spine. Mild chronic facet and endplate degeneration at L5-S1. 2. Mild aortic calcified atherosclerosis. Electronically Signed   By: Genevie Ann M.D.   On: 03/30/2015 09:38   Dg Si Joints  03/30/2015  CLINICAL DATA:  60 year old male with lumbosacral back pain radiating down the right lower extremity, acute onset yesterday. Initial encounter. EXAM: BILATERAL SACROILIAC JOINTS - 3+ VIEW COMPARISON:  CT Abdomen and Pelvis 08/24/2008. Pelvis MRI 01/01/2005 FINDINGS: Bone mineralization is within normal limits. SI joints appear stable and within normal limits. Sacral ala appear intact. Mild L5 facet hypertrophy appears stable. Visible pelvis intact. IMPRESSION: Normal radiographic appearance of the SI joints. Electronically Signed   By: Genevie Ann M.D.   On: 03/30/2015 09:36        Assessment & Plan:   1. Lower extremity pain, right   2. Pain in joint involving multiple sites   3. Weakness of right lower extremity   4. Chronic myeloid leukemia ([place])   5. Atherosclerosis of native coronary artery of native heart without angina pectoris   6. Type 2 diabetes mellitus with complication, without long-term current use of insulin (HCC)   Concern pt could have DVT of right  lower extremity due  to acuity of onset of right lower extremity pain, poss small increased circumfance and at increased risk due to chronic myeloid leukemia.  Stat doppler negative so suspect sciatica - poss from piriformis syndrome rather than DDD due to reassuring lumbar exam and xrays - start home stretches - If sxs persist/worsen - recheck and cons PT.  H/o DM and is now immunosuppressed due to [facility] and trx so avoid steroid burst if poss. Avoid nsaids as well due to CAD w/ h/o MI.  Orders Placed This Encounter  Procedures  . DG Lumbar Spine Complete    Standing Status: Future     Number of Occurrences: 1     Standing Expiration Date: 03/29/2016    Order Specific Question:  Reason for Exam (SYMPTOM  OR DIAGNOSIS REQUIRED)    Answer:  new right sciatic pain with weakness, acute onset yesterday    Order Specific Question:  Preferred imaging location?    Answer:  External  . DG Si Joints    Standing Status: Future     Number of Occurrences: 1     Standing Expiration Date: 03/29/2016    Order Specific Question:  Reason for Exam (SYMPTOM  OR DIAGNOSIS REQUIRED)    Answer:  new right sciatic pain with weakness, acute onset yesterday    Order Specific Question:  Preferred imaging location?    Answer:  External  . US Venous Img Lower Unilateral Right    Standing Status: Future     Number of Occurrences: 1     Standing Expiration Date: 05/27/2016    Order Specific Question:  Reason for Exam (SYMPTOM  OR DIAGNOSIS REQUIRED)    Answer:  pain and swelling    Order Specific Question:  Preferred imaging location?    Answer:  [facility]    Meds ordered this encounter  Medications  . ketorolac (TORADOL) 30 MG/ML injection 30 mg    Sig:   . oxyCODONE-acetaminophen (ROXICET) 5-325 MG tablet    Sig: Take 1 tablet by mouth every 8 (eight) hours as needed for severe pain.    Dispense:  20 tablet    Refill:  0    I personally performed the services described in this documentation, which  was scribed in my presence. The recorded information has been reviewed and considered, and addended by me as needed.  Delman Cheadle, MD MPH

## 2015-03-30 NOTE — Patient Instructions (Signed)
You have an appointment at [facility] at 11:00. Below is the address.   [address] [place], [place] [address]

## 2015-04-03 ENCOUNTER — Encounter: Payer: Self-pay | Admitting: Family Medicine

## 2015-04-11 ENCOUNTER — Telehealth: Payer: Self-pay | Admitting: Cardiovascular Disease

## 2015-04-11 NOTE — Telephone Encounter (Signed)
Dr Baird Cancer is calling in to speak with a nurse about the pt having an Abnormal EKG. She feels he needs to be seen.   Thanks

## 2015-04-11 NOTE — Telephone Encounter (Signed)
Spoke with dr Baird Cancer, she is going to fax an ECG for dr Oval Linsey to review and call her back. ECG given for dr Oval Linsey to review.

## 2015-05-01 ENCOUNTER — Encounter (HOSPITAL_COMMUNITY): Payer: Self-pay | Admitting: Emergency Medicine

## 2015-05-01 ENCOUNTER — Emergency Department (HOSPITAL_COMMUNITY)
Admission: EM | Admit: 2015-05-01 | Discharge: 2015-05-01 | Disposition: A | Discharge status: Home / Self Care | Payer: BLUE CROSS/BLUE SHIELD | Source: Home / Self Care | Attending: Emergency Medicine | Admitting: Emergency Medicine

## 2015-05-01 DIAGNOSIS — J069 Acute upper respiratory infection, unspecified: Secondary | ICD-10-CM

## 2015-05-01 MED ORDER — PREDNISONE 20 MG PO TABS: 40.0000 mg | ORAL_TABLET | Freq: Every day | ORAL | Status: DC

## 2015-05-01 MED ORDER — IPRATROPIUM BROMIDE 0.06 % NA SOLN: 2.0000 | Freq: Four times a day (QID) | NASAL | Status: DC

## 2015-05-01 NOTE — ED Provider Notes (Signed)
CSN: [id number]     Arrival date & time 05/01/15  1258 History   First MD Initiated Contact with Patient 05/01/15 1307     Chief Complaint  Patient presents with  . URI   (Consider location/radiation/quality/duration/timing/severity/associated sxs/prior Treatment) HPI  He is a 60 year old man here for evaluation of nasal congestion. He states his symptoms started 3 days ago and have gradually been getting worse. He reports significant nasal congestion, rhinorrhea, and postnasal drainage. He denies any ear pain or sore throat. He does report mild cough. No fevers or chills. He had one episode of posttussive emesis. He also had some diarrhea early this morning. He is eating and drinking well. He has been taking Claritin and Afrin without much improvement.  Past Medical History  Diagnosis Date  . Myocardial infarction ([place]) 02,6/12  . Hypertension   . Arthritis   . Bladder disorder   . Coronary artery disease     with prior MI 2002([facility]) and Cfx(2006) and RCA (2004) stents . EF 45-50%  . Hyperlipidemia   . Coronary atherosclerosis of native coronary artery     stable and suspect a component of CAS  . Diverticulosis     seen on colonoscopy in 2008  . H/O colonoscopy 2014  . Cancer ([place])     hx prostate cancer and CML  . Prostate cancer ([place])   . Chronic myelocytic leukemia [facility])    Past Surgical History  Procedure Laterality Date  . Carotid stent    . Tonsillectomy    . Fracture surgery      rt wrist  . Toe surgery      rt great toe  . Wrist surgery      rt and lt cysts removed  . Orif ankle fracture  07/04/2011    Procedure: OPEN REDUCTION INTERNAL FIXATION (ORIF) ANKLE FRACTURE;  Surgeon: Alta Corning, MD;  Location: [place];  Service: Orthopedics;  Laterality: Right;  . Cardiac catheterization      normal EF, patent stents without obstructive disease (on Plavix X 1 month only)  . Left heart catheterization with coronary angiogram N/A 02/04/2012    Procedure:  LEFT HEART CATHETERIZATION WITH CORONARY ANGIOGRAM;  Surgeon: Sinclair Grooms, MD;  Location: [facility] CATH LAB;  Service: Cardiovascular;  Laterality: N/A;   Family History  Problem Relation Age of Onset  . Cancer Mother     Liver   Social History  Substance Use Topics  . Smoking status: Former Smoker    Types: Cigarettes    Quit date: 06/06/2000  . Smokeless tobacco: Never Used     Comment: 2002  . Alcohol Use: No     Comment: rare    Review of Systems As in history of present illness Allergies  Contrast media  Home Medications   Prior to Admission medications   Medication Sig Start Date End Date Taking? Authorizing Provider  aspirin 81 MG chewable tablet Chew 162 mg by mouth daily.    Yes Historical Provider, MD  cholecalciferol (VITAMIN D) 1000 UNITS tablet Take 5,000 Units by mouth daily.    Yes Historical Provider, MD  darifenacin (ENABLEX) 15 MG 24 hr tablet Take 15 mg by mouth daily.    Yes Historical Provider, MD  dexlansoprazole (DEXILANT) 60 MG capsule Take 60 mg by mouth daily.   Yes Historical Provider, MD  hydrOXYzine (ATARAX/VISTARIL) 25 MG tablet Take 50 mg by mouth daily.   Yes Historical Provider, MD  imatinib (GLEEVEC) 400 MG tablet  Take 1 tablet (400 mg total) by mouth daily. Take with meals and large glass of water.Caution:Chemotherapy. 10/14/14  Yes Curt Bears, MD  isosorbide mononitrate (IMDUR) 60 MG 24 hr tablet TAKE ONE TABLET BY MOUTH ONCE DAILY 03/02/15  Yes Belva Crome, MD  metoprolol succinate (TOPROL-XL) 100 MG 24 hr tablet TAKE ONE TABLET BY MOUTH ONCE DAILY WITH  MEALS  OR  IMMEDIATELY  FOLLOWING  MEALS 03/02/15  Yes Belva Crome, MD  Mirabegron ER (MYRBETRIQ) 25 MG TB24 Take 25 mg by mouth daily.   Yes Historical Provider, MD  Ascorbic Acid (VITAMIN C) 1000 MG tablet Take 1,000 mg by mouth daily.    Historical Provider, MD  cyclobenzaprine (FLEXERIL) 10 MG tablet Take 0.5-1 tablets (5-10 mg total) by mouth 3 (three) times daily as needed for  muscle spasms. 03/30/15   Shawnee Knapp, MD  ipratropium (ATROVENT) 0.06 % nasal spray Place 2 sprays into both nostrils 4 (four) times daily. 05/01/15   Melony Overly, MD  nitroGLYCERIN (NITROLINGUAL) 0.4 MG/SPRAY spray Place 1 spray under the tongue every 5 (five) minutes x 3 doses as needed for chest pain. 12/25/13   Belva Crome, MD  oxyCODONE-acetaminophen (ROXICET) 5-325 MG tablet Take 1 tablet by mouth every 8 (eight) hours as needed for severe pain. 03/30/15   Shawnee Knapp, MD  predniSONE (DELTASONE) 20 MG tablet Take 2 tablets (40 mg total) by mouth daily. 05/01/15   Melony Overly, MD  prochlorperazine (COMPAZINE) 10 MG tablet Take 1 tablet (10 mg total) by mouth every 6 (six) hours as needed for nausea or vomiting. 01/26/14   Curt Bears, MD  traMADol (ULTRAM) 50 MG tablet Take 50 mg by mouth every 6 (six) hours. 05/14/14   Historical Provider, MD  valsartan (DIOVAN) 40 MG tablet TAKE ONE TABLET BY MOUTH ONCE DAILY 01/04/15   Belva Crome, MD   Meds Ordered and Administered this Visit  Medications - No data to display  BP 115/76 mmHg  Pulse 93  Temp(Src) 98 F (36.7 C) (Oral)  Resp 20  SpO2 97% No data found.   Physical Exam  Constitutional: He is oriented to person, place, and time. He appears well-developed and well-nourished. No distress.  HENT:  Mouth/Throat: No oropharyngeal exudate.  Clear postnasal drainage seen. Nasal mucosa is quite edematous and erythematous. TMs normal bilaterally. No sinus tenderness.  Neck: Neck supple.  Cardiovascular: Normal rate, regular rhythm and normal heart sounds.   No murmur heard. Pulmonary/Chest: Effort normal and breath sounds normal. No respiratory distress. He has no wheezes. He has no rales.  Lymphadenopathy:    He has no cervical adenopathy.  Neurological: He is alert and oriented to person, place, and time.    ED Course  Procedures (including critical care time)  Labs Review Labs Reviewed - No data to display  Imaging  Review No results found.    MDM   1. Viral URI    Symptomatic treatment with continued OTC allergy medication. We'll add Atrovent short course of prednisone. Discussed he should avoid Sudafed and decongestants given his cardiac history. Expect improvement over the next 2-3 days. Return precautions reviewed.    Melony Overly, MD 05/01/15 1328

## 2015-05-01 NOTE — Discharge Instructions (Signed)
You have a nasty cold virus. Continue an allergy pill daily. If Claritin is not working well, try Allegra or Zyrtec. Use the Atrovent nasal spray 4 times a day as needed for congestion or drainage. Take prednisone daily for 5 days to help with the inflammation. With your heart problems, avoid Sudafed and decongestants. You should start to feel better in the next 2-3 days. If you are getting worse, develop fevers, or shortness of breath, please come back.

## 2015-05-01 NOTE — ED Notes (Signed)
C/o cold sx onset thursday associated w/congsetion, runny nose, dry cough Denies fevers, chills A&O x4... No acute distress.

## 2015-05-04 ENCOUNTER — Other Ambulatory Visit (HOSPITAL_BASED_OUTPATIENT_CLINIC_OR_DEPARTMENT_OTHER): Payer: BLUE CROSS/BLUE SHIELD

## 2015-05-04 DIAGNOSIS — C9210 Chronic myeloid leukemia, BCR/ABL-positive, not having achieved remission: Secondary | ICD-10-CM | POA: Diagnosis not present

## 2015-05-04 LAB — LACTATE DEHYDROGENASE: LDH: 168 U/L (ref 125–245)

## 2015-05-04 LAB — COMPREHENSIVE METABOLIC PANEL
ALT: 14 U/L (ref 0–55)
ANION GAP: 8 meq/L (ref 3–11)
AST: 11 U/L (ref 5–34)
Albumin: 3.3 g/dL — ABNORMAL LOW (ref 3.5–5.0)
Alkaline Phosphatase: 44 U/L (ref 40–150)
BUN: 19 mg/dL (ref 7.0–26.0)
CALCIUM: 8.4 mg/dL (ref 8.4–10.4)
CHLORIDE: 109 meq/L (ref 98–109)
CO2: 25 meq/L (ref 22–29)
Creatinine: 1.3 mg/dL (ref 0.7–1.3)
EGFR: 73 mL/min/{1.73_m2} — AB (ref >90)
Glucose: 98 mg/dl (ref 70–140)
Potassium: 3.6 mEq/L (ref 3.5–5.1)
SODIUM: 143 meq/L (ref 136–145)
TOTAL PROTEIN: 6 g/dL — AB (ref 6.4–8.3)

## 2015-05-04 LAB — CBC WITH DIFFERENTIAL/PLATELET
BASO%: 0.3 % (ref 0.0–2.0)
BASOS ABS: 0 10*3/uL (ref 0.0–0.1)
EOS%: 0.1 % (ref 0.0–7.0)
Eosinophils Absolute: 0 10*3/uL (ref 0.0–0.5)
HEMATOCRIT: 36.6 % — AB (ref 38.4–49.9)
HGB: 12.5 g/dL — ABNORMAL LOW (ref 13.0–17.1)
LYMPH#: 2.8 10*3/uL (ref 0.9–3.3)
LYMPH%: 27.3 % (ref 14.0–49.0)
MCH: 32.6 pg (ref 27.2–33.4)
MCHC: 34.1 g/dL (ref 32.0–36.0)
MCV: 95.6 fL (ref 79.3–98.0)
MONO#: 0.8 10*3/uL (ref 0.1–0.9)
MONO%: 8.1 % (ref 0.0–14.0)
NEUT#: 6.6 10*3/uL — ABNORMAL HIGH (ref 1.5–6.5)
NEUT%: 64.2 % (ref 39.0–75.0)
Platelets: 239 10*3/uL (ref 140–400)
RBC: 3.83 10*6/uL — AB (ref 4.20–5.82)
RDW: 14.8 % — ABNORMAL HIGH (ref 11.0–14.6)
WBC: 10.3 10*3/uL (ref 4.0–10.3)

## 2015-05-10 ENCOUNTER — Telehealth: Payer: Self-pay | Admitting: Internal Medicine

## 2015-05-10 ENCOUNTER — Encounter: Payer: Self-pay | Admitting: Internal Medicine

## 2015-05-10 ENCOUNTER — Ambulatory Visit (HOSPITAL_BASED_OUTPATIENT_CLINIC_OR_DEPARTMENT_OTHER): Payer: BLUE CROSS/BLUE SHIELD | Admitting: Internal Medicine

## 2015-05-10 VITALS — BP 114/86 | HR 89 | Temp 98.6°F | Resp 17 | Ht 68.0 in | Wt 175.2 lb

## 2015-05-10 DIAGNOSIS — C9210 Chronic myeloid leukemia, BCR/ABL-positive, not having achieved remission: Secondary | ICD-10-CM | POA: Diagnosis not present
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
## 2015-05-10 NOTE — Telephone Encounter (Signed)
Gave and printed appt sched and avs for pt for June °

## 2015-05-11 ENCOUNTER — Ambulatory Visit: Payer: 59 | Admitting: Internal Medicine

## 2015-05-16 ENCOUNTER — Other Ambulatory Visit: Payer: Self-pay | Admitting: Interventional Cardiology

## 2015-05-18 ENCOUNTER — Telehealth: Payer: Self-pay | Admitting: Interventional Cardiology

## 2015-05-18 DIAGNOSIS — E785 Hyperlipidemia, unspecified: Secondary | ICD-10-CM

## 2015-05-18 NOTE — Telephone Encounter (Signed)
New Message:   Pt says the Atorvastatin needs to be changed please. He said the side effects stopped when he discontinue it.He would like to get something else.

## 2015-05-18 NOTE — Telephone Encounter (Signed)
Adv pt that I will fwd the update to Dr.Smith and call back with his response. Pt verbalized understanding

## 2015-05-21 NOTE — Telephone Encounter (Signed)
Rosuvastatin 5 mg daily Co Q 10, 100 mg daily OTC Liver and lipid

## 2015-05-23 MED ORDER — ROSUVASTATIN CALCIUM 5 MG PO TABS: 5.0000 mg | ORAL_TABLET | Freq: Every day | ORAL | Status: DC

## 2015-05-23 NOTE — Telephone Encounter (Signed)
Pt aware of Dr.Smith's recommendation. Rosuvastatin 5 mg daily Co Q 10, 100 mg daily OTC Liver and lipid Rx sent to pt pharmacy. Fasting lab appt scheduled forn  5/19. Pt qil purchase Co Q 10 100mg  daily  OTC. Pt verbalized understanding.

## 2015-06-16 ENCOUNTER — Telehealth: Payer: Self-pay | Admitting: Medical Oncology

## 2015-06-16 ENCOUNTER — Telehealth: Payer: Self-pay

## 2015-06-16 NOTE — Telephone Encounter (Signed)
Urgent cardiac clearance request received from [facility] is scheduled for a Right distal bicep repair on 06/22/15. Clearance is needed for pt to HOLD aspirin prior to surgery.  Clearance to be faxed attn: Sherri fax # [phone]  rqst fwd to Dr.Smith to advise

## 2015-06-16 NOTE — Telephone Encounter (Signed)
Faxed med clearance form back.

## 2015-06-17 NOTE — Telephone Encounter (Signed)
The patient is cleared for the upcoming orthopedic procedure.[name] to hold aspirin for several days prior to the procedure needed.

## 2015-06-20 NOTE — Telephone Encounter (Signed)
Cardiac clearance placed in MR nurse fax box to be faxed to [facility] attn: K[place] fax # [phone]

## 2015-07-22 ENCOUNTER — Other Ambulatory Visit (INDEPENDENT_AMBULATORY_CARE_PROVIDER_SITE_OTHER): Payer: BLUE CROSS/BLUE SHIELD | Admitting: *Deleted

## 2015-07-22 DIAGNOSIS — E785 Hyperlipidemia, unspecified: Secondary | ICD-10-CM | POA: Diagnosis not present

## 2015-07-22 LAB — LIPID PANEL
CHOL/HDL RATIO: 3.8 ratio (ref <=5.0)
CHOLESTEROL: 165 mg/dL (ref 125–200)
HDL: 44 mg/dL (ref >=40)
LDL Cholesterol: 106 mg/dL (ref <130)
TRIGLYCERIDES: 76 mg/dL (ref <150)
VLDL: 15 mg/dL (ref <30)

## 2015-07-22 LAB — ALT: ALT: 14 U/L (ref 9–46)

## 2015-07-22 NOTE — Addendum Note (Signed)
Addended by: Eulis Foster on: 07/22/2015 07:33 AM   Modules accepted: Orders

## 2015-08-10 ENCOUNTER — Telehealth: Payer: Self-pay

## 2015-08-10 ENCOUNTER — Ambulatory Visit (HOSPITAL_BASED_OUTPATIENT_CLINIC_OR_DEPARTMENT_OTHER): Payer: BLUE CROSS/BLUE SHIELD | Admitting: Internal Medicine

## 2015-08-10 ENCOUNTER — Telehealth: Payer: Self-pay | Admitting: Internal Medicine

## 2015-08-10 ENCOUNTER — Other Ambulatory Visit (HOSPITAL_BASED_OUTPATIENT_CLINIC_OR_DEPARTMENT_OTHER): Payer: BLUE CROSS/BLUE SHIELD

## 2015-08-10 ENCOUNTER — Encounter: Payer: Self-pay | Admitting: Internal Medicine

## 2015-08-10 VITALS — BP 116/79 | HR 77 | Temp 98.3°F | Resp 19 | Ht 68.0 in | Wt 180.6 lb

## 2015-08-10 DIAGNOSIS — C9210 Chronic myeloid leukemia, BCR/ABL-positive, not having achieved remission: Secondary | ICD-10-CM | POA: Diagnosis not present

## 2015-08-10 DIAGNOSIS — Z8546 Personal history of malignant neoplasm of prostate: Secondary | ICD-10-CM

## 2015-08-10 DIAGNOSIS — E785 Hyperlipidemia, unspecified: Secondary | ICD-10-CM

## 2015-08-10 LAB — COMPREHENSIVE METABOLIC PANEL
ALK PHOS: 49 U/L (ref 40–150)
ALT: 9 U/L (ref 0–55)
AST: 13 U/L (ref 5–34)
Albumin: 3.9 g/dL (ref 3.5–5.0)
Anion Gap: 7 mEq/L (ref 3–11)
BUN: 13.3 mg/dL (ref 7.0–26.0)
CO2: 26 meq/L (ref 22–29)
Calcium: 9.1 mg/dL (ref 8.4–10.4)
Chloride: 109 mEq/L (ref 98–109)
Creatinine: 1.2 mg/dL (ref 0.7–1.3)
EGFR: 78 mL/min/{1.73_m2} — AB (ref >90)
GLUCOSE: 104 mg/dL (ref 70–140)
POTASSIUM: 3.9 meq/L (ref 3.5–5.1)
SODIUM: 142 meq/L (ref 136–145)
TOTAL PROTEIN: 6.7 g/dL (ref 6.4–8.3)

## 2015-08-10 LAB — CBC WITH DIFFERENTIAL/PLATELET
BASO%: 1.1 % (ref 0.0–2.0)
Basophils Absolute: 0.1 10*3/uL (ref 0.0–0.1)
EOS ABS: 0.1 10*3/uL (ref 0.0–0.5)
EOS%: 1.2 % (ref 0.0–7.0)
HCT: 39.5 % (ref 38.4–49.9)
HEMOGLOBIN: 13.3 g/dL (ref 13.0–17.1)
LYMPH%: 35.4 % (ref 14.0–49.0)
MCH: 32.5 pg (ref 27.2–33.4)
MCHC: 33.6 g/dL (ref 32.0–36.0)
MCV: 96.7 fL (ref 79.3–98.0)
MONO#: 0.7 10*3/uL (ref 0.1–0.9)
MONO%: 11.7 % (ref 0.0–14.0)
NEUT%: 50.6 % (ref 39.0–75.0)
NEUTROS ABS: 3 10*3/uL (ref 1.5–6.5)
Platelets: 207 10*3/uL (ref 140–400)
RBC: 4.09 10*6/uL — AB (ref 4.20–5.82)
RDW: 13.8 % (ref 11.0–14.6)
WBC: 6 10*3/uL (ref 4.0–10.3)
lymph#: 2.1 10*3/uL (ref 0.9–3.3)

## 2015-08-10 LAB — LACTATE DEHYDROGENASE: LDH: 156 U/L (ref 125–245)

## 2015-08-10 MED ORDER — ROSUVASTATIN CALCIUM 10 MG PO TABS: 10.0000 mg | ORAL_TABLET | Freq: Every day | ORAL | Status: DC

## 2015-08-10 NOTE — Telephone Encounter (Signed)
Pt aware of lab results and Dr.Smith's recommendation. Increase in bad cholesterol from near 70 o 106. Target is 70. Increase crestor to 10 mg daily. Lipid and liver in 6-8 weeks.  Rx sent to pt pharmacy for Crestor 10mg  daily. Fasting lab appt scheduled for 10/10/15. Pt verbalized understanding.

## 2015-08-10 NOTE — Progress Notes (Signed)
[place] [phone]   [phone]  OFFICE PROGRESS NOTE  [name], [address] [place] [address]  DIAGNOSIS:  1) Chronic myeloid leukemia diagnosed in November of 2014. 2) History of prostate adenocarcinoma diagnosed in 2006  PRIOR THERAPY: Status post prostatectomy.  CURRENT THERAPY: Gleevec 400 mg by mouth daily, started 02/10/2013.  INTERVAL HISTORY: [name] 60 y.o. male returns to the clinic today for followup visit. The patient is feeling fine today with no specific complaints. He is wearing a brace at his right elbow secondary to rupture of biceps tendon. He denied having any significant weight loss or night sweats. He is tolerating his treatment with Gleevec fairly well. His last molecular study showed no concerning finding for disease progression. He denied having any significant chest pain, shortness of breath, cough or hemoptysis. He has no vomiting or change in bowel movement. The patient denied having any fever or chills. He has no significant weight loss or night sweats. He has repeat CBC, comprehensive metabolic panel, and LDH performed earlier today and he is here for evaluation and discussion of his lab results.  MEDICAL HISTORY: Past Medical History  Diagnosis Date  . Myocardial infarction ([place]) 02,6/12  . Hypertension   . Arthritis   . Bladder disorder   . Coronary artery disease     with prior MI 2002(BMS) and Cfx(2006) and RCA (2004) stents . EF 45-50%  . Hyperlipidemia   . Coronary atherosclerosis of native coronary artery     stable and suspect a component of CAS  . Diverticulosis     seen on colonoscopy in 2008  . H/O colonoscopy 2014  . Cancer ([place])     hx prostate cancer and CML  . Prostate cancer ([place])   . Chronic myelocytic leukemia (HCC)     ALLERGIES:  is allergic to contrast media.  MEDICATIONS:  Current Outpatient Prescriptions  Medication Sig Dispense  Refill  . Ascorbic Acid (VITAMIN C) 1000 MG tablet Take 1,000 mg by mouth daily.    Marland Kitchen aspirin 81 MG chewable tablet Chew 162 mg by mouth daily.     . cholecalciferol (VITAMIN D) 1000 UNITS tablet Take 5,000 Units by mouth daily.     . cyclobenzaprine (FLEXERIL) 10 MG tablet Take 0.5-1 tablets (5-10 mg total) by mouth 3 (three) times daily as needed for muscle spasms. 30 tablet 1  . darifenacin (ENABLEX) 15 MG 24 hr tablet Take 15 mg by mouth daily.     Marland Kitchen dexlansoprazole (DEXILANT) 60 MG capsule Take 60 mg by mouth daily.    Marland Kitchen HYDROcodone-homatropine (HYCODAN) 5-1.5 MG/5ML syrup   0  . hydrOXYzine (ATARAX/VISTARIL) 25 MG tablet Take 50 mg by mouth daily.    Marland Kitchen imatinib (GLEEVEC) 400 MG tablet Take 1 tablet (400 mg total) by mouth daily. Take with meals and large glass of water.Caution:Chemotherapy. 90 tablet 1  . indomethacin (INDOCIN) 50 MG capsule   0  . ipratropium (ATROVENT) 0.06 % nasal spray Place 2 sprays into both nostrils 4 (four) times daily. 15 mL 0  . isosorbide mononitrate (IMDUR) 60 MG 24 hr tablet TAKE ONE TABLET BY MOUTH ONCE DAILY 30 tablet 11  . meloxicam (MOBIC) 15 MG tablet   0  . metoprolol succinate (TOPROL-XL) 100 MG 24 hr tablet TAKE ONE TABLET BY MOUTH ONCE DAILY WITH  MEALS  OR  IMMEDIATELY  FOLLOWING  MEALS 30 tablet 11  . Mirabegron ER (MYRBETRIQ) 25  MG TB24 Take 25 mg by mouth daily.    . nitroGLYCERIN (NITROLINGUAL) 0.4 MG/SPRAY spray Place 1 spray under the tongue every 5 (five) minutes x 3 doses as needed for chest pain. 12 g 2  . oxyCODONE-acetaminophen (ROXICET) 5-325 MG tablet Take 1 tablet by mouth every 8 (eight) hours as needed for severe pain. 20 tablet 0  . predniSONE (DELTASONE) 20 MG tablet Take 2 tablets (40 mg total) by mouth daily. 10 tablet 0  . prochlorperazine (COMPAZINE) 10 MG tablet Take 1 tablet (10 mg total) by mouth every 6 (six) hours as needed for nausea or vomiting. 30 tablet 3  . rosuvastatin (CRESTOR) 5 MG tablet Take 1 tablet (5 mg total)  by mouth daily. 90 tablet 3  . traMADol (ULTRAM) 50 MG tablet Take 50 mg by mouth every 6 (six) hours.  0  . valsartan (DIOVAN) 40 MG tablet TAKE ONE TABLET BY MOUTH ONCE DAILY 30 tablet 8   No current facility-administered medications for this visit.    SURGICAL HISTORY:  Past Surgical History  Procedure Laterality Date  . Carotid stent    . Tonsillectomy    . Fracture surgery      rt wrist  . Toe surgery      rt great toe  . Wrist surgery      rt and lt cysts removed  . Orif ankle fracture  07/04/2011    Procedure: OPEN REDUCTION INTERNAL FIXATION (ORIF) ANKLE FRACTURE;  Surgeon: Alta Corning, MD;  Location: [facility];  Service: Orthopedics;  Laterality: Right;  . Cardiac catheterization      normal EF, patent stents without obstructive disease (on Plavix X 1 month only)  . Left heart catheterization with coronary angiogram N/A 02/04/2012    Procedure: LEFT HEART CATHETERIZATION WITH CORONARY ANGIOGRAM;  Surgeon: Sinclair Grooms, MD;  Location: [facility] CATH LAB;  Service: Cardiovascular;  Laterality: N/A;    REVIEW OF SYSTEMS:  A comprehensive review of systems was negative.   PHYSICAL EXAMINATION: General appearance: alert, cooperative and no distress Head: Normocephalic, without obvious abnormality, atraumatic Neck: no adenopathy, no JVD, supple, symmetrical, trachea midline and thyroid not enlarged, symmetric, no tenderness/mass/nodules Lymph nodes: Cervical, supraclavicular, and axillary nodes normal. Resp: clear to auscultation bilaterally Back: symmetric, no curvature. ROM normal. No CVA tenderness. Cardio: regular rate and rhythm, S1, S2 normal, no murmur, click, rub or gallop GI: soft, non-tender; bowel sounds normal; no masses,  no organomegaly Extremities: extremities normal, atraumatic, no cyanosis or edema Neurologic: Alert and oriented X 3, normal strength and tone. Normal symmetric reflexes. Normal coordination and gait    ECOG PERFORMANCE STATUS: 0  - Asymptomatic  Blood pressure 116/79, pulse 77, temperature 98.3 F (36.8 C), temperature source Oral, resp. rate 19, height 5\' 8"  (1.727 m), weight 180 lb 9.6 oz (81.92 kg), SpO2 100 %.  LABORATORY DATA: Lab Results  Component Value Date   WBC 6.0 08/10/2015   HGB 13.3 08/10/2015   HCT 39.5 08/10/2015   MCV 96.7 08/10/2015   PLT 207 08/10/2015      Chemistry      Component Value Date/Time   NA 143 05/04/2015 0816   NA 140 12/13/2013 1045   K 3.6 05/04/2015 0816   K 4.3 12/13/2013 1045   CL 105 12/13/2013 1045   CO2 25 05/04/2015 0816   CO2 26 12/13/2013 1045   BUN 19.0 05/04/2015 0816   BUN 12 12/13/2013 1045   CREATININE 1.3 05/04/2015 0816  CREATININE 1.16 12/13/2013 1045      Component Value Date/Time   CALCIUM 8.4 05/04/2015 0816   CALCIUM 8.6 12/13/2013 1045   ALKPHOS 44 05/04/2015 0816   ALKPHOS 43 12/13/2013 1045   AST 11 05/04/2015 0816   AST 19 12/13/2013 1045   ALT 14 07/22/2015 0736   ALT 14 05/04/2015 0816   BILITOT <0.30 05/04/2015 0816   BILITOT <0.2* 12/13/2013 1045       RADIOGRAPHIC STUDIES: No results found.  ASSESSMENT AND PLAN: This is a very pleasant 60 years old [place] American male recently diagnosed with chronic myeloid leukemia. He is currently on treatment with Gleevec 400 mg by mouth daily and tolerating it fairly well. His CBC is unremarkable. The molecular study for BCR/ABL performed 3 months ago showed persistent major molecular response. I discussed the lab result with the patient today.  I recommended for him to continue his current treatment with Gleevec 400 mg by mouth daily. He would come back for follow-up visit in 3 months with repeat CBC, comprehensive metabolic panel, LDH. He was advised to call immediately if he has any concerning symptoms in the interval. The patient voices understanding of current disease status and treatment options and is in agreement with the current care plan.  All questions were answered. The  patient knows to call the clinic with any problems, questions or concerns. We can certainly see the patient much sooner if necessary.  Disclaimer: This note was dictated with voice recognition software. Similar sounding words can inadvertently be transcribed and may not be corrected upon review.

## 2015-08-10 NOTE — Telephone Encounter (Signed)
-----   Message from Belva Crome, MD sent at 07/22/2015  9:33 PM EDT ----- Increase in bad cholesterol from near 70 o 106. Target is 70. Increase crestor to 10 mg daily. Lipid and liver in 6-8 weeks

## 2015-08-10 NOTE — Telephone Encounter (Signed)
Gave and printed appt sched and avs for pt for Sept °

## 2015-08-15 ENCOUNTER — Ambulatory Visit (INDEPENDENT_AMBULATORY_CARE_PROVIDER_SITE_OTHER): Payer: BLUE CROSS/BLUE SHIELD | Admitting: Family Medicine

## 2015-08-15 VITALS — BP 124/84 | HR 78 | Temp 98.0°F | Resp 16 | Ht 68.0 in | Wt 181.6 lb

## 2015-08-15 DIAGNOSIS — C9110 Chronic lymphocytic leukemia of B-cell type not having achieved remission: Secondary | ICD-10-CM

## 2015-08-15 DIAGNOSIS — R21 Rash and other nonspecific skin eruption: Secondary | ICD-10-CM

## 2015-08-15 DIAGNOSIS — N489 Disorder of penis, unspecified: Secondary | ICD-10-CM

## 2015-08-15 MED ORDER — VALACYCLOVIR HCL 1 G PO TABS
1000.0000 mg | ORAL_TABLET | Freq: Two times a day (BID) | ORAL | Status: DC
Start: 2015-08-15 — End: 2016-02-01

## 2015-08-15 MED ORDER — TRIAMCINOLONE ACETONIDE 0.1 % EX CREA: 1.0000 "application " | TOPICAL_CREAM | Freq: Two times a day (BID) | CUTANEOUS | Status: DC

## 2015-08-15 NOTE — Patient Instructions (Addendum)
We will let you know the results of the herpes testing in a few days.   Take the valacyclovir 1000 mg 1 pill twice daily for possible herpes infection. It is safer to go ahead and treat promptly and wait for the testing. If the testing is all negative you can discontinue this.  Use the triamcinolone cream twice daily on the rash area. This would be for something like poison ivy and should help calm the itching. Do not use it on the penis for more than 5-7 days.  Return if further problems develop    IF you received an x-ray today, you will receive an invoice from [facility]. Please contact [facility] at [phone] with questions or concerns regarding your invoice.   IF you received labwork today, you will receive an invoice from [employer]. Please contact [facility] at [phone] with questions or concerns regarding your invoice.   Our billing staff will not be able to assist you with questions regarding bills from these companies.  You will be contacted with the lab results as soon as they are available. The fastest way to get your results is to activate your My Chart account. Instructions are located on the last page of this paperwork. If you have not heard from [place] regarding the results in 2 weeks, please contact this office.

## 2015-08-15 NOTE — Progress Notes (Signed)
Patient ID: [name], male    DOB: 1955-07-13  Age: 60 y.o. MRN: [id number]  Chief Complaint  Patient presents with  . Rash    on his penis, x 2 days blistering, itching     Subjective:   60 year old man who has a three-day history of a rash developing on his penis. One of the little blisters popped. He has had prostate cancer has not been sexually involved for years. Has not been messing around. He does not know of having had HSV previously. He does have CLL, so his immune system is decreased. He has not had this before.  Current allergies, medications, problem list, past/family and social histories reviewed.  Objective:  BP 124/84 mmHg  Pulse 78  Temp(Src) 98 F (36.7 C) (Oral)  Resp 16  Ht 5\' 8"  (1.727 m)  Wt 181 lb 9.6 oz (82.373 kg)  BMI 27.62 kg/m2  SpO2 97%  No major distress. Right side of the shaft of the penis has some small blisters and excoriated areas that look very herpetic.  Assessment & Plan:   Assessment: 1. Rash of penis   2. CLL (chronic lymphocytic leukemia) (HCC)       Plan: Culture and treat as if it was herpes though his risk level seems low. I wonder if he could've gotten some poison ivy on his penis but doesn't look quite characteristic for that. We'll give some triamcinolone cream also. Return if worse.  Orders Placed This Encounter  Procedures  . Herpes simplex virus culture  . HSV(herpes simplex vrs) 1+2 ab-IgG    Meds ordered this encounter  Medications  . triamcinolone cream (KENALOG) 0.1 %    Sig: Apply 1 application topically 2 (two) times daily.    Dispense:  15 g    Refill:  0  . valACYclovir (VALTREX) 1000 MG tablet    Sig: Take 1 tablet (1,000 mg total) by mouth 2 (two) times daily.    Dispense:  20 tablet    Refill:  0         Patient Instructions   We will let you know the results of the herpes testing in a few days.   Take the valacyclovir 1000 mg 1 pill twice daily for possible herpes infection. It is  safer to go ahead and treat promptly and wait for the testing. If the testing is all negative you can discontinue this.  Use the triamcinolone cream twice daily on the rash area. This would be for something like poison ivy and should help calm the itching. Do not use it on the penis for more than 5-7 days.  Return if further problems develop    IF you received an x-ray today, you will receive an invoice from [facility]. Please contact [facility]iology at [phone] with questions or concerns regarding your invoice.   IF you received labwork today, you will receive an invoice from [employer]. Please contact [facility] at [phone] with questions or concerns regarding your invoice.   Our billing staff will not be able to assist you with questions regarding bills from these companies.  You will be contacted with the lab results as soon as they are available. The fastest way to get your results is to activate your My Chart account. Instructions are located on the last page of this paperwork. If you have not heard from [place] regarding the results in 2 weeks, please contact this office.          Return  if symptoms worsen or fail to improve.   Tomoya Ringwald, MD 08/15/2015

## 2015-08-16 LAB — HSV(HERPES SIMPLEX VRS) I + II AB-IGG: HSV 2 GLYCOPROTEIN G AB, IGG: 11.4 {index} — AB (ref <0.90)

## 2015-08-17 LAB — HERPES SIMPLEX VIRUS CULTURE: Organism ID, Bacteria: NOT DETECTED

## 2015-08-22 ENCOUNTER — Other Ambulatory Visit: Payer: Self-pay | Admitting: Medical Oncology

## 2015-08-22 DIAGNOSIS — C9210 Chronic myeloid leukemia, BCR/ABL-positive, not having achieved remission: Secondary | ICD-10-CM

## 2015-08-22 MED ORDER — IMATINIB MESYLATE 400 MG PO TABS: 400.0000 mg | ORAL_TABLET | Freq: Every day | ORAL | Status: DC

## 2015-08-22 NOTE — Progress Notes (Signed)
Faxed gleevec refill to [facility]

## 2015-10-10 ENCOUNTER — Other Ambulatory Visit: Payer: BLUE CROSS/BLUE SHIELD

## 2015-10-11 ENCOUNTER — Other Ambulatory Visit: Payer: BLUE CROSS/BLUE SHIELD

## 2015-10-18 ENCOUNTER — Other Ambulatory Visit (INDEPENDENT_AMBULATORY_CARE_PROVIDER_SITE_OTHER): Payer: BLUE CROSS/BLUE SHIELD

## 2015-10-18 DIAGNOSIS — E785 Hyperlipidemia, unspecified: Secondary | ICD-10-CM | POA: Diagnosis not present

## 2015-10-18 LAB — LIPID PANEL
Cholesterol: 129 mg/dL (ref 125–200)
HDL: 40 mg/dL (ref >=40)
LDL Cholesterol: 78 mg/dL (ref <130)
Total CHOL/HDL Ratio: 3.2 Ratio (ref <=5.0)
Triglycerides: 53 mg/dL (ref <150)
VLDL: 11 mg/dL (ref <30)

## 2015-10-18 LAB — HEPATIC FUNCTION PANEL
ALBUMIN: 4.1 g/dL (ref 3.6–5.1)
ALK PHOS: 49 U/L (ref 40–115)
ALT: 17 U/L (ref 9–46)
AST: 18 U/L (ref 10–35)
BILIRUBIN TOTAL: 0.4 mg/dL (ref 0.2–1.2)
Bilirubin, Direct: 0.1 mg/dL (ref <=0.2)
Indirect Bilirubin: 0.3 mg/dL (ref 0.2–1.2)
TOTAL PROTEIN: 6.5 g/dL (ref 6.1–8.1)

## 2015-10-19 ENCOUNTER — Other Ambulatory Visit: Payer: Self-pay

## 2015-10-19 DIAGNOSIS — E785 Hyperlipidemia, unspecified: Secondary | ICD-10-CM

## 2015-11-23 ENCOUNTER — Other Ambulatory Visit: Payer: BLUE CROSS/BLUE SHIELD

## 2015-11-23 ENCOUNTER — Ambulatory Visit: Payer: BLUE CROSS/BLUE SHIELD | Admitting: Internal Medicine

## 2015-11-24 ENCOUNTER — Telehealth: Payer: Self-pay | Admitting: Internal Medicine

## 2015-11-24 NOTE — Telephone Encounter (Signed)
Pt. Called to have 09/20 appointment rescheduled. Rescheduled to 10/10. Pt. Could only attend early AM appointments.

## 2015-12-12 ENCOUNTER — Other Ambulatory Visit: Payer: Self-pay | Admitting: Medical Oncology

## 2015-12-12 DIAGNOSIS — C9210 Chronic myeloid leukemia, BCR/ABL-positive, not having achieved remission: Secondary | ICD-10-CM

## 2015-12-13 ENCOUNTER — Encounter: Payer: Self-pay | Admitting: Internal Medicine

## 2015-12-13 ENCOUNTER — Telehealth: Payer: Self-pay | Admitting: Internal Medicine

## 2015-12-13 ENCOUNTER — Other Ambulatory Visit (HOSPITAL_BASED_OUTPATIENT_CLINIC_OR_DEPARTMENT_OTHER): Payer: BLUE CROSS/BLUE SHIELD

## 2015-12-13 ENCOUNTER — Ambulatory Visit (HOSPITAL_BASED_OUTPATIENT_CLINIC_OR_DEPARTMENT_OTHER): Payer: BLUE CROSS/BLUE SHIELD | Admitting: Internal Medicine

## 2015-12-13 VITALS — BP 109/74 | HR 80 | Temp 98.4°F | Resp 18 | Ht 68.0 in | Wt 186.7 lb

## 2015-12-13 DIAGNOSIS — C9210 Chronic myeloid leukemia, BCR/ABL-positive, not having achieved remission: Secondary | ICD-10-CM | POA: Diagnosis not present

## 2015-12-13 LAB — CBC WITH DIFFERENTIAL/PLATELET
BASO%: 0.9 % (ref 0.0–2.0)
BASOS ABS: 0 10*3/uL (ref 0.0–0.1)
EOS ABS: 0.1 10*3/uL (ref 0.0–0.5)
EOS%: 1.9 % (ref 0.0–7.0)
HCT: 41.7 % (ref 38.4–49.9)
HEMOGLOBIN: 13.9 g/dL (ref 13.0–17.1)
LYMPH%: 35.4 % (ref 14.0–49.0)
MCH: 32.3 pg (ref 27.2–33.4)
MCHC: 33.3 g/dL (ref 32.0–36.0)
MCV: 97.1 fL (ref 79.3–98.0)
MONO#: 0.5 10*3/uL (ref 0.1–0.9)
MONO%: 9.5 % (ref 0.0–14.0)
NEUT#: 2.8 10*3/uL (ref 1.5–6.5)
NEUT%: 52.3 % (ref 39.0–75.0)
Platelets: 228 10*3/uL (ref 140–400)
RBC: 4.29 10*6/uL (ref 4.20–5.82)
RDW: 14 % (ref 11.0–14.6)
WBC: 5.4 10*3/uL (ref 4.0–10.3)
lymph#: 1.9 10*3/uL (ref 0.9–3.3)

## 2015-12-13 LAB — COMPREHENSIVE METABOLIC PANEL
ALBUMIN: 3.9 g/dL (ref 3.5–5.0)
ALK PHOS: 61 U/L (ref 40–150)
ALT: 13 U/L (ref 0–55)
ANION GAP: 10 meq/L (ref 3–11)
AST: 18 U/L (ref 5–34)
BUN: 13 mg/dL (ref 7.0–26.0)
CALCIUM: 9.1 mg/dL (ref 8.4–10.4)
CHLORIDE: 109 meq/L (ref 98–109)
CO2: 22 mEq/L (ref 22–29)
Creatinine: 1.3 mg/dL (ref 0.7–1.3)
EGFR: 69 mL/min/{1.73_m2} — AB (ref >90)
Glucose: 101 mg/dl (ref 70–140)
POTASSIUM: 4.2 meq/L (ref 3.5–5.1)
Sodium: 141 mEq/L (ref 136–145)
Total Bilirubin: 0.38 mg/dL (ref 0.20–1.20)
Total Protein: 6.8 g/dL (ref 6.4–8.3)

## 2015-12-13 NOTE — Telephone Encounter (Signed)
Avs report and appointment schedule given to patient, per 12/13/15 los. °

## 2015-12-13 NOTE — Progress Notes (Signed)
[place] [phone]   [phone]  OFFICE PROGRESS NOTE  [name], [address] [id number]  DIAGNOSIS:  1) Chronic myeloid leukemia diagnosed in November of 2014. 2) History of prostate adenocarcinoma diagnosed in 2006  PRIOR THERAPY: Status post prostatectomy.  CURRENT THERAPY: Gleevec 400 mg by mouth daily, started 02/10/2013.  INTERVAL HISTORY: [name] 60 y.o. male returns to the clinic today for followup visit. The patient is feeling fine today with no specific complaints. He denied having any significant weight loss or night sweats. He is tolerating his treatment with Gleevec fairly well. He denied having any significant chest pain, shortness of breath, cough or hemoptysis. He has no vomiting or change in bowel movement. The patient denied having any fever or chills. He has no significant weight loss or night sweats. He actually gained 6 pounds since his last visit. He has repeat CBC, comprehensive metabolic panel, and LDH performed earlier today and he is here for evaluation and discussion of his lab results.  MEDICAL HISTORY: Past Medical History:  Diagnosis Date  . Arthritis   . Bladder disorder   . Cancer ([place])    hx prostate cancer and CML  . Chronic myelocytic leukemia ([place])   . Coronary artery disease    with prior MI 2002([facility]) and Cfx(2006) and RCA (2004) stents . EF 45-50%  . Coronary atherosclerosis of native coronary artery    stable and suspect a component of CAS  . Diverticulosis    seen on colonoscopy in 2008  . H/O colonoscopy 2014  . Hyperlipidemia   . Hypertension   . Myocardial infarction 02,6/12  . Prostate cancer ([place])     ALLERGIES:  is allergic to contrast media [iodinated diagnostic agents].  MEDICATIONS:  Current Outpatient Prescriptions  Medication Sig Dispense Refill  . Ascorbic Acid (VITAMIN C) 1000 MG tablet Take 1,000 mg by mouth daily.    Marland Kitchen  aspirin 81 MG chewable tablet Chew 162 mg by mouth daily.     . cholecalciferol (VITAMIN D) 1000 UNITS tablet Take 5,000 Units by mouth daily.     Marland Kitchen co-enzyme Q-10 30 MG capsule Take 30 mg by mouth 3 (three) times daily.    Marland Kitchen darifenacin (ENABLEX) 15 MG 24 hr tablet Take 15 mg by mouth daily.     Marland Kitchen dexlansoprazole (DEXILANT) 60 MG capsule Take 60 mg by mouth daily.    . diazepam (VALIUM) 2 MG tablet   0  . HYDROcodone-homatropine (HYCODAN) 5-1.5 MG/5ML syrup Reported on 08/15/2015  0  . hydrOXYzine (ATARAX/VISTARIL) 25 MG tablet Take 50 mg by mouth daily.    Marland Kitchen imatinib (GLEEVEC) 400 MG tablet Take 1 tablet (400 mg total) by mouth daily. Take with meals and large glass of water.Caution:Chemotherapy. 90 tablet 2  . indomethacin (INDOCIN) 50 MG capsule   0  . ipratropium (ATROVENT) 0.06 % nasal spray Place 2 sprays into both nostrils 4 (four) times daily. (Patient not taking: Reported on 08/15/2015) 15 mL 0  . isosorbide mononitrate (IMDUR) 60 MG 24 hr tablet TAKE ONE TABLET BY MOUTH ONCE DAILY 30 tablet 11  . meloxicam (MOBIC) 15 MG tablet   0  . metoprolol succinate (TOPROL-XL) 100 MG 24 hr tablet TAKE ONE TABLET BY MOUTH ONCE DAILY WITH  MEALS  OR  IMMEDIATELY  FOLLOWING  MEALS 30 tablet 11  . Mirabegron ER (MYRBETRIQ) 25 MG TB24 Take 25 mg by mouth daily.    Marland Kitchen  nitroGLYCERIN (NITROLINGUAL) 0.4 MG/SPRAY spray Place 1 spray under the tongue every 5 (five) minutes x 3 doses as needed for chest pain. 12 g 2  . prochlorperazine (COMPAZINE) 10 MG tablet Take 1 tablet (10 mg total) by mouth every 6 (six) hours as needed for nausea or vomiting. 30 tablet 3  . rosuvastatin (CRESTOR) 10 MG tablet Take 1 tablet (10 mg total) by mouth daily. 30 tablet 9  . traMADol (ULTRAM) 50 MG tablet Take 50 mg by mouth every 6 (six) hours. Reported on 08/15/2015  0  . triamcinolone cream (KENALOG) 0.1 % Apply 1 application topically 2 (two) times daily. 15 g 0  . valACYclovir (VALTREX) 1000 MG tablet Take 1 tablet (1,000 mg  total) by mouth 2 (two) times daily. 20 tablet 0  . valsartan (DIOVAN) 40 MG tablet TAKE ONE TABLET BY MOUTH ONCE DAILY 30 tablet 8   No current facility-administered medications for this visit.     SURGICAL HISTORY:  Past Surgical History:  Procedure Laterality Date  . CARDIAC CATHETERIZATION     normal EF, patent stents without obstructive disease (on Plavix X 1 month only)  . CAROTID STENT    . FRACTURE SURGERY     rt wrist  . LEFT HEART CATHETERIZATION WITH CORONARY ANGIOGRAM N/A 02/04/2012   Procedure: LEFT HEART CATHETERIZATION WITH CORONARY ANGIOGRAM;  Surgeon: Sinclair Grooms, MD;  Location[facility] CATH LAB;  Service: Cardiovascular;  Laterality: N/A;  . ORIF ANKLE FRACTURE  07/04/2011   Procedure: OPEN REDUCTION INTERNAL FIXATION (ORIF) ANKLE FRACTURE;  Surgeon: Alta Corning, MD;  Location: [facility];  Service: Orthopedics;  Laterality: Right;  . TOE SURGERY     rt great toe  . TONSILLECTOMY    . WRIST SURGERY     rt and lt cysts removed    REVIEW OF SYSTEMS:  A comprehensive review of systems was negative.   PHYSICAL EXAMINATION: General appearance: alert, cooperative and no distress Head: Normocephalic, without obvious abnormality, atraumatic Neck: no adenopathy, no JVD, supple, symmetrical, trachea midline and thyroid not enlarged, symmetric, no tenderness/mass/nodules Lymph nodes: Cervical, supraclavicular, and axillary nodes normal. Resp: clear to auscultation bilaterally Back: symmetric, no curvature. ROM normal. No CVA tenderness. Cardio: regular rate and rhythm, S1, S2 normal, no murmur, click, rub or gallop GI: soft, non-tender; bowel sounds normal; no masses,  no organomegaly Extremities: extremities normal, atraumatic, no cyanosis or edema Neurologic: Alert and oriented X 3, normal strength and tone. Normal symmetric reflexes. Normal coordination and gait    ECOG PERFORMANCE STATUS: 0 - Asymptomatic  Blood pressure 109/74, pulse 80, temperature  98.4 F (36.9 C), temperature source Oral, resp. rate 18, height 5\' 8"  (1.727 m), weight 186 lb 11.2 oz (84.7 kg), SpO2 99 %.  LABORATORY DATA: Lab Results  Component Value Date   WBC 5.4 12/13/2015   HGB 13.9 12/13/2015   HCT 41.7 12/13/2015   MCV 97.1 12/13/2015   PLT 228 12/13/2015      Chemistry      Component Value Date/Time   NA 142 08/10/2015 0834   K 3.9 08/10/2015 0834   CL 105 12/13/2013 1045   CO2 26 08/10/2015 0834   BUN 13.3 08/10/2015 0834   CREATININE 1.2 08/10/2015 0834      Component Value Date/Time   CALCIUM 9.1 08/10/2015 0834   ALKPHOS 49 10/18/2015 0819   ALKPHOS 49 08/10/2015 0834   AST 18 10/18/2015 0819   AST 13 08/10/2015 0834   ALT 17  10/18/2015 0819   ALT 9 08/10/2015 0834   BILITOT 0.4 10/18/2015 0819   BILITOT <0.30 08/10/2015 0834       RADIOGRAPHIC STUDIES: No results found.  ASSESSMENT AND PLAN: This is a very pleasant 60 years old [place] American male recently diagnosed with chronic myeloid leukemia. He is currently on treatment with Gleevec 400 mg by mouth daily and tolerating it fairly well. His CBC is unremarkable.  I discussed the lab result with the patient today.  I recommended for him to continue his current treatment with Gleevec 400 mg by mouth daily. He would come back for follow-up visit in 3 months with repeat CBC, comprehensive metabolic panel, LDHAs well as molecular study for BCR/ABL. He was advised to call immediately if he has any concerning symptoms in the interval. The patient voices understanding of current disease status and treatment options and is in agreement with the current care plan.  All questions were answered. The patient knows to call the clinic with any problems, questions or concerns. We can certainly see the patient much sooner if necessary.  Disclaimer: This note was dictated with voice recognition software. Similar sounding words can inadvertently be transcribed and may not be corrected upon  review.

## 2016-02-01 ENCOUNTER — Ambulatory Visit (INDEPENDENT_AMBULATORY_CARE_PROVIDER_SITE_OTHER): Payer: BLUE CROSS/BLUE SHIELD | Admitting: Emergency Medicine

## 2016-02-01 ENCOUNTER — Ambulatory Visit (INDEPENDENT_AMBULATORY_CARE_PROVIDER_SITE_OTHER): Payer: BLUE CROSS/BLUE SHIELD

## 2016-02-01 VITALS — BP 130/80 | HR 89 | Temp 99.0°F | Resp 17 | Ht 68.0 in | Wt 184.0 lb

## 2016-02-01 DIAGNOSIS — R05 Cough: Secondary | ICD-10-CM

## 2016-02-01 DIAGNOSIS — J208 Acute bronchitis due to other specified organisms: Secondary | ICD-10-CM | POA: Diagnosis not present

## 2016-02-01 DIAGNOSIS — R059 Cough, unspecified: Secondary | ICD-10-CM

## 2016-02-01 LAB — POCT CBC
GRANULOCYTE PERCENT: 52.7 % (ref 37–80)
HEMATOCRIT: 41.4 % — AB (ref 43.5–53.7)
HEMOGLOBIN: 14.7 g/dL (ref 14.1–18.1)
LYMPH, POC: 2.2 (ref 0.6–3.4)
MCH: 33.4 pg — AB (ref 27–31.2)
MCHC: 35.5 g/dL — AB (ref 31.8–35.4)
MCV: 94.1 fL (ref 80–97)
MID (cbc): 0.7 (ref 0–0.9)
MPV: 6.8 fL (ref 0–99.8)
POC GRANULOCYTE: 3.3 (ref 2–6.9)
POC LYMPH PERCENT: 35.4 %L (ref 10–50)
POC MID %: 11.9 %M (ref 0–12)
Platelet Count, POC: 198 10*3/uL (ref 142–424)
RBC: 4.4 M/uL — AB (ref 4.69–6.13)
RDW, POC: 13.8 %
WBC: 6.2 10*3/uL (ref 4.6–10.2)

## 2016-02-01 MED ORDER — GUAIFENESIN-CODEINE 100-10 MG/5ML PO SOLN: ORAL | 0 refills | Status: DC

## 2016-02-01 MED ORDER — AMOXICILLIN-POT CLAVULANATE 875-125 MG PO TABS: 1.0000 | ORAL_TABLET | Freq: Two times a day (BID) | ORAL | 0 refills | Status: DC

## 2016-02-01 MED ORDER — FLUTICASONE PROPIONATE 50 MCG/ACT NA SUSP: 2.0000 | Freq: Every day | NASAL | 6 refills | Status: DC

## 2016-02-01 NOTE — Patient Instructions (Addendum)
Please take medication as prescribed. Recheck 48-72 hours if not improving.    IF you received an x-ray today, you will receive an invoice from [facility] Radiology. Please contact [facility] Radiology at [phone] with questions or concerns regarding your invoice.   IF you received labwork today, you will receive an invoice from [employer]. Please contact [facility] at [phone] with questions or concerns regarding your invoice.   Our billing staff will not be able to assist you with questions regarding bills from these companies.  You will be contacted with the lab results as soon as they are available. The fastest way to get your results is to activate your My Chart account. Instructions are located on the last page of this paperwork. If you have not heard from [place] regarding the results in 2 weeks, please contact this office.

## 2016-02-01 NOTE — Progress Notes (Signed)
By signing my name below, I, [name], attest that this documentation has been prepared under the direction and in the presence of [name], MD. Electronically Signed: [name], [place]. 02/01/2016 , 4:23 PM .  Patient was seen in room 3 .  Chief Complaint:  Chief Complaint  Patient presents with  . Cough    onset 2 weeks, getting worse    HPI: [name] is a 60 y.o. male who reports to [facility] today complaining of worsening cough which started about 2 weeks ago. He reports coughing up clear phlegm with intermittent nasal congestion. He also notes chest cramping from these strong coughs. He denies any fever. He denies history of asthma or use of inhalers.   He has history of leukemia, diagnosed in 2014, followed by Dr. [name] at the [facility]. He is on gleevec for leukemia. He is seen by oncologist every 3 months. He also has heart disease, last episode in 2012, followed by Dr. [name]. He denies having stent placed. He's doing well on medications. His PCP is [name], MD; received pneumonia vaccine and flu shot in Oct from PCP.   Per [facility], he does see Dr. [name] as he stated, and is doing well on [place]. His last WBC was at 5.4. He is due for follow up in 3 months.   Past Medical History:  Diagnosis Date  . Arthritis   . Bladder disorder   . Cancer ([place])    hx prostate cancer and CML  . Chronic myelocytic leukemia ([name])   . Coronary artery disease    with prior MI 2002(BMS) and Cfx(2006) and RCA (2004) stents . EF 45-50%  . Coronary atherosclerosis of native coronary artery    stable and suspect a component of CAS  . Diverticulosis    seen on colonoscopy in 2008  . H/O colonoscopy 2014  . Hyperlipidemia   . Hypertension   . Myocardial infarction 02,6/12  . Prostate cancer [facility])    Past Surgical History:  Procedure Laterality Date  . CARDIAC CATHETERIZATION     normal EF, patent stents without obstructive disease (on Plavix X 1 month only)    . CAROTID STENT    . FRACTURE SURGERY     rt wrist  . LEFT HEART CATHETERIZATION WITH CORONARY ANGIOGRAM N/A 02/04/2012   Procedure: LEFT HEART CATHETERIZATION WITH CORONARY ANGIOGRAM;  Surgeon: Sinclair Grooms, MD;  Location: [facility] CATH LAB;  Service: Cardiovascular;  Laterality: N/A;  . ORIF ANKLE FRACTURE  07/04/2011   Procedure: OPEN REDUCTION INTERNAL FIXATION (ORIF) ANKLE FRACTURE;  Surgeon: Alta Corning, MD;  Location: [facility];  Service: Orthopedics;  Laterality: Right;  . TOE SURGERY     rt great toe  . TONSILLECTOMY    . WRIST SURGERY     rt and lt cysts removed   Social History   Social History  . Marital status: Legally Separated    Spouse name: N/A  . Number of children: N/A  . Years of education: N/A   Social History Main Topics  . Smoking status: Former Smoker    Types: Cigarettes    Quit date: 06/06/2000  . Smokeless tobacco: Never Used     Comment: 2002  . Alcohol use No     Comment: rare  . Drug use: No  . Sexual activity: Not Asked   Other Topics Concern  . None   Social History Narrative  . None   Family History  Problem  Relation Age of Onset  . Cancer Mother     Liver   Allergies  Allergen Reactions  . Contrast Media [Iodinated Diagnostic Agents] Other (See Comments)    Reaction unknown   Prior to Admission medications   Medication Sig Start Date End Date Taking? Authorizing Provider  Ascorbic Acid (VITAMIN C) 1000 MG tablet Take 1,000 mg by mouth daily.   Yes Historical Provider, MD  aspirin 81 MG chewable tablet Chew 162 mg by mouth daily.    Yes Historical Provider, MD  cholecalciferol (VITAMIN D) 1000 UNITS tablet Take 5,000 Units by mouth daily.    Yes Historical Provider, MD  co-enzyme Q-10 30 MG capsule Take 30 mg by mouth 3 (three) times daily.   Yes Historical Provider, MD  darifenacin (ENABLEX) 15 MG 24 hr tablet Take 15 mg by mouth daily.    Yes Historical Provider, MD  dexlansoprazole (DEXILANT) 60 MG capsule Take  60 mg by mouth daily.   Yes Historical Provider, MD  hydrOXYzine (ATARAX/VISTARIL) 25 MG tablet Take 50 mg by mouth daily.   Yes Historical Provider, MD  imatinib (GLEEVEC) 400 MG tablet Take 1 tablet (400 mg total) by mouth daily. Take with meals and large glass of water.Caution:Chemotherapy. 08/22/15  Yes Curt Bears, MD  indomethacin (INDOCIN) 50 MG capsule  05/02/15  Yes Historical Provider, MD  isosorbide mononitrate (IMDUR) 60 MG 24 hr tablet TAKE ONE TABLET BY MOUTH ONCE DAILY 03/02/15  Yes Belva Crome, MD  metoprolol succinate (TOPROL-XL) 100 MG 24 hr tablet TAKE ONE TABLET BY MOUTH ONCE DAILY WITH  MEALS  OR  IMMEDIATELY  FOLLOWING  MEALS 03/02/15  Yes Belva Crome, MD  MYRBETRIQ 50 MG TB24 tablet daily. 11/30/15  Yes Historical Provider, MD  nitroGLYCERIN (NITROLINGUAL) 0.4 MG/SPRAY spray Place 1 spray under the tongue every 5 (five) minutes x 3 doses as needed for chest pain. 12/25/13  Yes Belva Crome, MD  rosuvastatin (CRESTOR) 10 MG tablet Take 1 tablet (10 mg total) by mouth daily. 08/10/15  Yes Belva Crome, MD  valsartan (DIOVAN) 40 MG tablet TAKE ONE TABLET BY MOUTH ONCE DAILY 05/17/15  Yes Belva Crome, MD     ROS:  Constitutional: negative for fever, chills, night sweats, weight changes, or fatigue  HEENT: negative for vision changes, hearing loss, rhinorrhea, ST, epistaxis, or sinus pressure; positive for congestion Cardiovascular: negative for chest pain or palpitations Respiratory: negative for hemoptysis, wheezing, shortness of breath; positive for cough Abdominal: negative for abdominal pain, nausea, vomiting, diarrhea, or constipation Dermatological: negative for rash Neurologic: negative for headache, dizziness, or syncope All other systems reviewed and are otherwise negative with the exception to those above and in the HPI.  PHYSICAL EXAM: Vitals:   02/01/16 1554  BP: 130/80  Pulse: 89  Resp: 17  Temp: 99 F (37.2 C)   Body mass index is 27.98  kg/m.   General: Alert, no acute distress HEENT:  Normocephalic, atraumatic, oropharynx patent; nasal congestion and drainage Eye: EOMI, PEERLDC Cardiovascular:  Regular rate and rhythm, no rubs murmurs or gallops.  No Carotid bruits, radial pulse intact. No pedal edema.  Respiratory: Clear to auscultation bilaterally.  No wheezes, rales, or rhonchi.  No cyanosis, no use of accessory musculature Abdominal: No organomegaly, abdomen is soft and non-tender, positive bowel sounds. No masses. Musculoskeletal: Gait intact. No edema, tenderness Skin: No rashes. Neurologic: Facial musculature symmetric. Psychiatric: Patient acts appropriately throughout our interaction.  Lymphatic: No cervical or submandibular lymphadenopathy Genitourinary/Anorectal: No  acute findings  LABS: Results for orders placed or performed in visit on 02/01/16  POCT CBC  Result Value Ref Range   WBC 6.2 4.6 - 10.2 K/uL   Lymph, poc 2.2 0.6 - 3.4   POC LYMPH PERCENT 35.4 10 - 50 %L   MID (cbc) 0.7 0 - 0.9   POC MID % 11.9 0 - 12 %M   POC Granulocyte 3.3 2 - 6.9   Granulocyte percent 52.7 37 - 80 %G   RBC 4.40 (A) 4.69 - 6.13 M/uL   Hemoglobin 14.7 14.1 - 18.1 g/dL   HCT, POC 41.4 (A) 43.5 - 53.7 %   MCV 94.1 80 - 97 fL   MCH, POC 33.4 (A) 27 - 31.2 pg   MCHC 35.5 (A) 31.8 - 35.4 g/dL   RDW, POC 13.8 %   Platelet Count, POC 198 142 - 424 K/uL   MPV 6.8 0 - 99.8 fL     EKG/XRAY:   Dg Chest 2 View  Result Date: 02/01/2016 CLINICAL DATA:  Cough for over 1 week. EXAM: CHEST  2 VIEW COMPARISON:  12/13/2013 FINDINGS: Normal heart size and mediastinal contours when accounting for mild leftward rotation. Coronary stent noted. There is no edema, consolidation, effusion, or pneumothorax. No osseous findings. IMPRESSION: Negative chest. Electronically Signed   By: Monte Fantasia M.D.   On: 02/01/2016 16:43    ASSESSMENT/PLAN: Burnis Medin go ahead and cover with antibiotics because of his immunocompromised status. Will  treat with Augmentin for 7 days along with Robitussin with codeine and Flonase nasal spray.I personally performed the services described in this documentation, which was scribed in my presence. The recorded information has been reviewed and is accurate.  Gross sideeffects, risk and benefits, and alternatives of medications d/w patient. Patient is aware that all medications have potential sideeffects and we are unable to predict every sideeffect or drug-drug interaction that may occur.  [name] MD 02/01/2016 4:11 PM

## 2016-02-21 ENCOUNTER — Ambulatory Visit: Payer: BLUE CROSS/BLUE SHIELD | Admitting: Interventional Cardiology

## 2016-02-29 ENCOUNTER — Other Ambulatory Visit: Payer: Self-pay | Admitting: Interventional Cardiology

## 2016-03-07 ENCOUNTER — Other Ambulatory Visit (HOSPITAL_BASED_OUTPATIENT_CLINIC_OR_DEPARTMENT_OTHER): Payer: BLUE CROSS/BLUE SHIELD

## 2016-03-07 DIAGNOSIS — C9210 Chronic myeloid leukemia, BCR/ABL-positive, not having achieved remission: Secondary | ICD-10-CM | POA: Diagnosis not present

## 2016-03-07 LAB — CBC WITH DIFFERENTIAL/PLATELET
BASO%: 0.6 % (ref 0.0–2.0)
BASOS ABS: 0 10*3/uL (ref 0.0–0.1)
EOS%: 2.2 % (ref 0.0–7.0)
Eosinophils Absolute: 0.1 10*3/uL (ref 0.0–0.5)
HCT: 37.2 % — ABNORMAL LOW (ref 38.4–49.9)
HGB: 13.2 g/dL (ref 13.0–17.1)
LYMPH#: 2.9 10*3/uL (ref 0.9–3.3)
LYMPH%: 43.8 % (ref 14.0–49.0)
MCH: 32.4 pg (ref 27.2–33.4)
MCHC: 35.5 g/dL (ref 32.0–36.0)
MCV: 91.4 fL (ref 79.3–98.0)
MONO#: 0.8 10*3/uL (ref 0.1–0.9)
MONO%: 12.8 % (ref 0.0–14.0)
NEUT#: 2.6 10*3/uL (ref 1.5–6.5)
NEUT%: 40.6 % (ref 39.0–75.0)
Platelets: 190 10*3/uL (ref 140–400)
RBC: 4.07 10*6/uL — AB (ref 4.20–5.82)
RDW: 14.6 % (ref 11.0–14.6)
WBC: 6.5 10*3/uL (ref 4.0–10.3)
nRBC: 0 % (ref 0–0)

## 2016-03-07 LAB — COMPREHENSIVE METABOLIC PANEL
ALK PHOS: 67 U/L (ref 40–150)
ALT: 11 U/L (ref 0–55)
AST: 15 U/L (ref 5–34)
Albumin: 3.9 g/dL (ref 3.5–5.0)
Anion Gap: 10 mEq/L (ref 3–11)
BUN: 13.4 mg/dL (ref 7.0–26.0)
CHLORIDE: 109 meq/L (ref 98–109)
CO2: 24 meq/L (ref 22–29)
Calcium: 9 mg/dL (ref 8.4–10.4)
Creatinine: 1.3 mg/dL (ref 0.7–1.3)
EGFR: 71 mL/min/{1.73_m2} — AB (ref >90)
GLUCOSE: 108 mg/dL (ref 70–140)
POTASSIUM: 3.9 meq/L (ref 3.5–5.1)
SODIUM: 142 meq/L (ref 136–145)
Total Bilirubin: 0.24 mg/dL (ref 0.20–1.20)
Total Protein: 6.5 g/dL (ref 6.4–8.3)

## 2016-03-08 LAB — LACTATE DEHYDROGENASE: LDH: 165 U/L (ref 125–245)

## 2016-03-15 ENCOUNTER — Ambulatory Visit (HOSPITAL_BASED_OUTPATIENT_CLINIC_OR_DEPARTMENT_OTHER): Payer: BLUE CROSS/BLUE SHIELD | Admitting: Internal Medicine

## 2016-03-15 ENCOUNTER — Encounter: Payer: Self-pay | Admitting: Internal Medicine

## 2016-03-15 ENCOUNTER — Telehealth: Payer: Self-pay | Admitting: Internal Medicine

## 2016-03-15 VITALS — BP 118/82 | HR 77 | Temp 97.9°F | Resp 16 | Ht 68.0 in | Wt 185.3 lb

## 2016-03-15 DIAGNOSIS — Z8546 Personal history of malignant neoplasm of prostate: Secondary | ICD-10-CM

## 2016-03-15 DIAGNOSIS — C9210 Chronic myeloid leukemia, BCR/ABL-positive, not having achieved remission: Secondary | ICD-10-CM

## 2016-03-15 NOTE — Telephone Encounter (Signed)
Gave patient avs report and appointments for April  °

## 2016-03-15 NOTE — Progress Notes (Signed)
[place] [phone]   [phone]  OFFICE PROGRESS NOTE  [name], [address] [place] [address]  DIAGNOSIS:  1) Chronic myeloid leukemia diagnosed in November of 2014. 2) History of prostate adenocarcinoma diagnosed in 2006  PRIOR THERAPY: Status post prostatectomy.  CURRENT THERAPY: Gleevec 400 mg by mouth daily, started 02/10/2013.  INTERVAL HISTORY: [name] 61 y.o. male came to the clinic today for routine three-month follow-up visit. The patient is currently on treatment with Gleevec 400 mg by mouth daily started in December 2014 and has been tolerating this treatment well with no concerning complaints. He denied having any chest pain, shortness of breath, cough or hemoptysis. He has no fever or chills. He denied having any weight loss or night sweats. He has no nausea, vomiting, diarrhea or constipation. The patient has repeat CBC, comprehensive metabolic panel, LDH as well as molecular study for BCR/ABL and he is here today for evaluation and discussion of his lab results and treatment options.   MEDICAL HISTORY: Past Medical History:  Diagnosis Date  . Arthritis   . Bladder disorder   . Cancer ([place])    hx prostate cancer and CML  . Chronic myelocytic leukemia ([place])   . Coronary artery disease    with prior MI 2002(BMS) and Cfx(2006) and RCA (2004) stents . EF 45-50%  . Coronary atherosclerosis of native coronary artery    stable and suspect a component of CAS  . Diverticulosis    seen on colonoscopy in 2008  . H/O colonoscopy 2014  . Hyperlipidemia   . Hypertension   . Myocardial infarction 02,6/12  . Prostate cancer ([place])     ALLERGIES:  is allergic to contrast media [iodinated diagnostic agents].  MEDICATIONS:  Current Outpatient Prescriptions  Medication Sig Dispense Refill  . Ascorbic Acid (VITAMIN C) 1000 MG tablet Take 1,000 mg by mouth daily.    Marland Kitchen aspirin 81 MG chewable  tablet Chew 162 mg by mouth daily.     . cholecalciferol (VITAMIN D) 1000 UNITS tablet Take 5,000 Units by mouth daily.     Marland Kitchen co-enzyme Q-10 30 MG capsule Take 30 mg by mouth 3 (three) times daily.    Marland Kitchen darifenacin (ENABLEX) 15 MG 24 hr tablet Take 15 mg by mouth daily.     Marland Kitchen dexlansoprazole (DEXILANT) 60 MG capsule Take 60 mg by mouth daily.    . fluticasone (FLONASE) 50 MCG/ACT nasal spray Place 2 sprays into both nostrils daily. 16 g 6  . guaiFENesin-codeine 100-10 MG/5ML syrup 1-2 teaspoons every 4-6 hours as needed for cough 240 mL 0  . hydrOXYzine (ATARAX/VISTARIL) 25 MG tablet Take 50 mg by mouth daily.    Marland Kitchen imatinib (GLEEVEC) 400 MG tablet Take 1 tablet (400 mg total) by mouth daily. Take with meals and large glass of water.Caution:Chemotherapy. 90 tablet 2  . isosorbide mononitrate (IMDUR) 60 MG 24 hr tablet TAKE ONE TABLET BY MOUTH ONCE DAILY 30 tablet 11  . metoprolol succinate (TOPROL-XL) 100 MG 24 hr tablet TAKE ONE TABLET BY MOUTH ONCE DAILY WITH  MEALS  OR  IMMEDIATELY  FOLLOWING  MEALS 30 tablet 11  . MYRBETRIQ 50 MG TB24 tablet daily.  1  . rosuvastatin (CRESTOR) 10 MG tablet Take 1 tablet (10 mg total) by mouth daily. 30 tablet 9  . valsartan (DIOVAN) 40 MG tablet TAKE ONE TABLET BY MOUTH ONCE DAILY 30 tablet 0  . amoxicillin-clavulanate (AUGMENTIN) 875-125  MG tablet Take 1 tablet by mouth 2 (two) times daily. (Patient not taking: Reported on 03/15/2016) 14 tablet 0  . indomethacin (INDOCIN) 50 MG capsule   0  . nitroGLYCERIN (NITROLINGUAL) 0.4 MG/SPRAY spray Place 1 spray under the tongue every 5 (five) minutes x 3 doses as needed for chest pain. (Patient not taking: Reported on 03/15/2016) 12 g 2   No current facility-administered medications for this visit.     SURGICAL HISTORY:  Past Surgical History:  Procedure Laterality Date  . CARDIAC CATHETERIZATION     normal EF, patent stents without obstructive disease (on Plavix X 1 month only)  . CAROTID STENT    . FRACTURE  SURGERY     rt wrist  . LEFT HEART CATHETERIZATION WITH CORONARY ANGIOGRAM N/A 02/04/2012   Procedure: LEFT HEART CATHETERIZATION WITH CORONARY ANGIOGRAM;  Surgeon: Sinclair Grooms, MD;  Location[facility] C[facility] LAB;  Service: Cardiovascular;  Laterality: N/A;  . ORIF ANKLE FRACTURE  07/04/2011   Procedure: OPEN REDUCTION INTERNAL FIXATION (ORIF) ANKLE FRACTURE;  Surgeon: Alta Corning, MD;  Location: [facility];  Service: Orthopedics;  Laterality: Right;  . TOE SURGERY     rt great toe  . TONSILLECTOMY    . WRIST SURGERY     rt and lt cysts removed    REVIEW OF SYSTEMS:  A comprehensive review of systems was negative.   PHYSICAL EXAMINATION: General appearance: alert, cooperative and no distress Head: Normocephalic, without obvious abnormality, atraumatic Neck: no adenopathy, no JVD, supple, symmetrical, trachea midline and thyroid not enlarged, symmetric, no tenderness/mass/nodules Lymph nodes: Cervical, supraclavicular, and axillary nodes normal. Resp: clear to auscultation bilaterally Back: symmetric, no curvature. ROM normal. No CVA tenderness. Cardio: regular rate and rhythm, S1, S2 normal, no murmur, click, rub or gallop GI: soft, non-tender; bowel sounds normal; no masses,  no organomegaly Extremities: extremities normal, atraumatic, no cyanosis or edema    ECOG PERFORMANCE STATUS: 0 - Asymptomatic  Blood pressure 118/82, pulse 77, temperature 97.9 F (36.6 C), temperature source Oral, resp. rate 16, height 5\' 8"  (1.727 m), weight 185 lb 4.8 oz (84.1 kg), SpO2 99 %.  LABORATORY DATA: Lab Results  Component Value Date   WBC 6.5 03/07/2016   HGB 13.2 03/07/2016   HCT 37.2 (L) 03/07/2016   MCV 91.4 03/07/2016   PLT 190 03/07/2016      Chemistry      Component Value Date/Time   NA 142 03/07/2016 1604   K 3.9 03/07/2016 1604   CL 105 12/13/2013 1045   CO2 24 03/07/2016 1604   BUN 13.4 03/07/2016 1604   CREATININE 1.3 03/07/2016 1604      Component Value  Date/Time   CALCIUM 9.0 03/07/2016 1604   ALKPHOS 67 03/07/2016 1604   AST 15 03/07/2016 1604   ALT 11 03/07/2016 1604   BILITOT 0.24 03/07/2016 1604       RADIOGRAPHIC STUDIES: No results found.  ASSESSMENT AND PLAN:  This is a very pleasant 61 years old African-American male with chronic myeloid leukemia. He is currently on treatment with Gleevec 400 mg by mouth daily since December 2014. The patient is tolerating his treatment well. His recent blood work is unremarkable for any abnormality. The molecular study for BCR/ABL is a still pending. I recommended for the patient to continue on his treatment with Gleevec with the same dose. He would come back for follow-up visit in 3 months for reevaluation with repeat CBC, comprehensive metabolic panel and LDH. The  patient was advised to call immediately if he has any concerning symptoms in the interval.  The patient voices understanding of current disease status and treatment options and is in agreement with the current care plan.  All questions were answered. The patient knows to call the clinic with any problems, questions or concerns. We can certainly see the patient much sooner if necessary. I spent 10 minutes counseling the patient face to face. The total time spent in the appointment was 15 minutes.  Disclaimer: This note was dictated with voice recognition software. Similar sounding words can inadvertently be transcribed and may not be corrected upon review.

## 2016-04-03 ENCOUNTER — Other Ambulatory Visit: Payer: Self-pay | Admitting: Interventional Cardiology

## 2016-04-05 ENCOUNTER — Telehealth: Payer: Self-pay | Admitting: *Deleted

## 2016-04-05 DIAGNOSIS — E785 Hyperlipidemia, unspecified: Secondary | ICD-10-CM

## 2016-04-05 MED ORDER — ROSUVASTATIN CALCIUM 20 MG PO TABS: 20.0000 mg | ORAL_TABLET | Freq: Every day | ORAL | 3 refills | Status: DC

## 2016-04-05 NOTE — Telephone Encounter (Signed)
-----   Message from Belva Crome, MD sent at 04/04/2016  1:45 PM EST ----- Lipids reviewed. Target is LDL less than 70. Intensify diet by decreasing saturated fat. Increase rosuvastatin to 20 mg per day. Repeat lipid and liver panel in 6-8 weeks.

## 2016-04-05 NOTE — Telephone Encounter (Signed)
Informed pt of lab results. Pt will plan to repeat labs on 3/22.  Pt verbalized understanding and was in agreement with plan.

## 2016-04-13 ENCOUNTER — Encounter: Payer: Self-pay | Admitting: Interventional Cardiology

## 2016-04-13 ENCOUNTER — Ambulatory Visit (INDEPENDENT_AMBULATORY_CARE_PROVIDER_SITE_OTHER): Payer: BLUE CROSS/BLUE SHIELD | Admitting: Interventional Cardiology

## 2016-04-13 VITALS — BP 100/80 | HR 71 | Ht 66.0 in | Wt 187.8 lb

## 2016-04-13 DIAGNOSIS — I2510 Atherosclerotic heart disease of native coronary artery without angina pectoris: Secondary | ICD-10-CM

## 2016-04-13 DIAGNOSIS — E785 Hyperlipidemia, unspecified: Secondary | ICD-10-CM

## 2016-04-13 DIAGNOSIS — R072 Precordial pain: Secondary | ICD-10-CM | POA: Diagnosis not present

## 2016-04-13 DIAGNOSIS — I10 Essential (primary) hypertension: Secondary | ICD-10-CM

## 2016-04-13 DIAGNOSIS — R079 Chest pain, unspecified: Secondary | ICD-10-CM

## 2016-04-13 MED ORDER — NITROGLYCERIN 0.4 MG/SPRAY TL SOLN: 1.0000 | 2 refills | Status: DC | PRN

## 2016-04-13 MED ORDER — ROSUVASTATIN CALCIUM 20 MG PO TABS: 20.0000 mg | ORAL_TABLET | Freq: Every day | ORAL | 3 refills | Status: DC

## 2016-04-13 NOTE — Patient Instructions (Signed)
Medication Instructions:  1) Crestor 20mg  once daily  Labwork: Keep appointment for follow up labs.  Testing/Procedures: Your physician has requested that you have an exercise tolerance test. For further information please visit [email or web address]. Please also follow instruction sheet, as given.   Follow-Up: Your physician wants you to follow-up in: 1 year with Dr. [name].  You will receive a reminder letter in the mail two months in advance. If you don't receive a letter, please call our office to schedule the follow-up appointment.   Any Other Special Instructions Will Be Listed Below (If Applicable).     If you need a refill on your cardiac medications before your next appointment, please call your pharmacy.

## 2016-04-13 NOTE — Progress Notes (Signed)
Cardiology Office Note    Date:  04/13/2016   ID:  [name], DOB February 26, 1956, MRN [id number]  PCP:  [name], MD  Cardiologist: Sinclair Grooms, MD   Chief Complaint  Patient presents with  . Coronary Artery Disease    History of Present Illness:  [name] is a 61 y.o. male for CAD, hypertension, hyperlipidemia,. Other significant problems include CML, history of prostate cancer, and arthritis.  Vague episodes of left precordial discomfort over the past month. Episodes are nonexertional. He denies dyspnea. His youngest son is in college. He is "burning the candle at both ends" with work. 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 He is working hard to achieve finances for his son's education at [employer].  He denies dyspnea, palpitations, and syncope.   Past Medical History:  Diagnosis Date  . Arthritis   . Bladder disorder   . Cancer ([place])    hx prostate cancer and CML  . Chronic myelocytic leukemia ([place])   . Coronary artery disease    with prior MI 2002(BMS) and Cfx(2006) and RCA (2004) stents . EF 45-50%  . Coronary atherosclerosis of native coronary artery    stable and suspect a component of CAS  . Diverticulosis    seen on colonoscopy in 2008  . H/O colonoscopy 2014  . Hyperlipidemia   . Hypertension   . Myocardial infarction 02,6/12  . Prostate cancer [facility])     Past Surgical History:  Procedure Laterality Date  . CARDIAC CATHETERIZATION     normal EF, patent stents without obstructive disease (on Plavix X 1 month only)  . CAROTID STENT    . FRACTURE SURGERY     rt wrist  . LEFT HEART CATHETERIZATION WITH CORONARY ANGIOGRAM N/A 02/04/2012   Procedure: LEFT HEART CATHETERIZATION WITH CORONARY ANGIOGRAM;  Surgeon: Sinclair Grooms, MD;  Location: [facility] CATH LAB;  Service: Cardiovascular;  Laterality: N/A;  . ORIF ANKLE FRACTURE  07/04/2011   Procedure: OPEN  REDUCTION INTERNAL FIXATION (ORIF) ANKLE FRACTURE;  Surgeon: Alta Corning, MD;  Location: [facility];  Service: Orthopedics;  Laterality: Right;  . TOE SURGERY     rt great toe  . TONSILLECTOMY    . WRIST SURGERY     rt and lt cysts removed    Current Medications: Outpatient Medications Prior to Visit  Medication Sig Dispense Refill  . Ascorbic Acid (VITAMIN C) 1000 MG tablet Take 1,000 mg by mouth daily.    Marland Kitchen aspirin 81 MG chewable tablet Chew 162 mg by mouth daily.     . cholecalciferol (VITAMIN D) 1000 UNITS tablet Take 5,000 Units by mouth daily.     Marland Kitchen darifenacin (ENABLEX) 15 MG 24 hr tablet Take 15 mg by mouth daily.     Marland Kitchen dexlansoprazole (DEXILANT) 60 MG capsule Take 60 mg by mouth daily.    Marland Kitchen guaiFENesin-codeine 100-10 MG/5ML syrup 1-2 teaspoons every 4-6 hours as needed for cough 240 mL 0  . hydrOXYzine (ATARAX/VISTARIL) 25 MG tablet Take 50 mg by mouth daily.    Marland Kitchen imatinib (GLEEVEC) 400 MG tablet Take 1 tablet (400 mg total) by mouth daily. Take with meals and large glass of water.Caution:Chemotherapy. 90 tablet 2  . isosorbide mononitrate (IMDUR) 60 MG 24 hr tablet TAKE ONE TABLET BY MOUTH ONCE DAILY 30 tablet 0  . metoprolol succinate (TOPROL-XL) 100 MG 24 hr tablet  TAKE ONE TABLET BY MOUTH ONCE DAILY WITH MEALS OR  IMMEDIATELY  FOLLOWING  MEALS 30 tablet 0  . MYRBETRIQ 50 MG TB24 tablet daily.  1  . nitroGLYCERIN (NITROLINGUAL) 0.4 MG/SPRAY spray Place 1 spray under the tongue every 5 (five) minutes x 3 doses as needed for chest pain. 12 g 2  . rosuvastatin (CRESTOR) 20 MG tablet Take 1 tablet (20 mg total) by mouth daily. 90 tablet 3  . valsartan (DIOVAN) 40 MG tablet Take 1 tablet (40 mg total) by mouth daily. 30 tablet 0  . amoxicillin-clavulanate (AUGMENTIN) 875-125 MG tablet Take 1 tablet by mouth 2 (two) times daily. (Patient not taking: Reported on 03/15/2016) 14 tablet 0  . co-enzyme Q-10 30 MG capsule Take 30 mg by mouth 3 (three) times daily.    .  fluticasone (FLONASE) 50 MCG/ACT nasal spray Place 2 sprays into both nostrils daily. (Patient not taking: Reported on 04/13/2016) 16 g 6  . indomethacin (INDOCIN) 50 MG capsule   0   No facility-administered medications prior to visit.      Allergies:   Contrast media [iodinated diagnostic agents]   Social History   Social History  . Marital status: Legally Separated    Spouse name: N/A  . Number of children: N/A  . Years of education: N/A   Social History Main Topics  . Smoking status: Former Smoker    Types: Cigarettes    Quit date: 06/06/2000  . Smokeless tobacco: Never Used     Comment: 2002  . Alcohol use No     Comment: rare  . Drug use: No  . Sexual activity: Not Asked   Other Topics Concern  . None   Social History Narrative  . None     Family History:  The patient's family history includes Cancer in his mother.   ROS:   Please see the history of present illness.    Underwent right biceps surgery since last visit.  All other systems reviewed and are negative.   PHYSICAL EXAM:   VS:  BP 100/80 (BP Location: Left Arm)   Pulse 71   Ht 5\' 6"  (1.676 m)   Wt 187 lb 12.8 oz (85.2 kg)   BMI 30.31 kg/m    GEN: Well nourished, well developed, in no acute distress  HEENT: normal  Neck: no JVD, carotid bruits, or masses Cardiac: RRR; no murmurs, rubs, or gallops,no edema  Respiratory:  clear to auscultation bilaterally, normal work of breathing GI: soft, nontender, nondistended, + BS MS: no deformity or atrophy  Skin: warm and dry, no rash Neuro:  Alert and Oriented x 3, Strength and sensation are intact Psych: euthymic mood, full affect  Wt Readings from Last 3 Encounters:  04/13/16 187 lb 12.8 oz (85.2 kg)  03/15/16 185 lb 4.8 oz (84.1 kg)  02/01/16 184 lb (83.5 kg)      Studies/Labs Reviewed:   EKG:  EKG  Normal sinus rhythm, nonspecific T wave flattening, and no change when compared to the prior tracing.  Recent Labs: 03/07/2016: ALT 11; BUN 13.4;  Creatinine 1.3; HGB 13.2; Platelets 190; Potassium 3.9; Sodium 142   Lipid Panel    Component Value Date/Time   CHOL 129 10/18/2015 0819   TRIG 53 10/18/2015 0819   HDL 40 10/18/2015 0819   CHOLHDL 3.2 10/18/2015 0819   VLDL 11 10/18/2015 0819   LDLCALC 78 10/18/2015 0819    Additional studies/ records that were reviewed today include:  None  ASSESSMENT:    1. Coronary artery disease involving native coronary artery of native heart without angina pectoris   2. Essential hypertension   3. Precordial chest pain   4. Hyperlipidemia with target low density lipoprotein (LDL) cholesterol less than 70 mg/dL      PLAN:  In order of problems listed above:  1. Recent vague chest discomfort, not clearly a pattern suggesting angina. No recent myocardial perfusion imaging. Last study was performed in 2015 which revealed low to moderate risk. 2. Excellent blood pressure control. Continue 2 g sodium diet. Attempt to control weight. 3. Atypical in nature. Exclude ischemia. Excess treadmill test will be performed. 4. Most recent lipid panel performed by primary care, demonstrated an LDL of 119. Crestor was increased to 20 mg per day. A new prescription for the higher doses given today. A lipid and liver panel will be done in 6-8 weeks.  Assuming the excised treadmill test was nonischemic, I will plan to see the patient back again in one year. I encouraged aerobic activity and weight control.    Medication Adjustments/Labs and Tests Ordered: Current medicines are reviewed at length with the patient today.  Concerns regarding medicines are outlined above.  Medication changes, Labs and Tests ordered today are listed in the Patient Instructions below. There are no Patient Instructions on file for this visit.   Signed, Sinclair Grooms, MD  04/13/2016 9:14 AM    [facility] [address], [place], [place]  [address] Phone: [phone]; Fax: [phone]

## 2016-04-26 ENCOUNTER — Ambulatory Visit (INDEPENDENT_AMBULATORY_CARE_PROVIDER_SITE_OTHER): Payer: BLUE CROSS/BLUE SHIELD

## 2016-04-26 DIAGNOSIS — I2510 Atherosclerotic heart disease of native coronary artery without angina pectoris: Secondary | ICD-10-CM

## 2016-04-26 DIAGNOSIS — R079 Chest pain, unspecified: Secondary | ICD-10-CM | POA: Diagnosis not present

## 2016-04-26 LAB — EXERCISE TOLERANCE TEST
CSEPED: 8 min
CSEPHR: 93 %
Estimated workload: 10.1 METS
Exercise duration (sec): 0 s
MPHR: 161 {beats}/min
Peak HR: 151 {beats}/min
RPE: 16
Rest HR: 81 {beats}/min

## 2016-05-02 ENCOUNTER — Other Ambulatory Visit: Payer: Self-pay | Admitting: Interventional Cardiology

## 2016-05-24 ENCOUNTER — Other Ambulatory Visit: Payer: BLUE CROSS/BLUE SHIELD

## 2016-05-28 ENCOUNTER — Other Ambulatory Visit: Payer: BLUE CROSS/BLUE SHIELD | Admitting: *Deleted

## 2016-05-28 DIAGNOSIS — E785 Hyperlipidemia, unspecified: Secondary | ICD-10-CM

## 2016-05-29 LAB — LIPID PANEL
CHOLESTEROL TOTAL: 159 mg/dL (ref 100–199)
Chol/HDL Ratio: 3.8 ratio units (ref 0.0–5.0)
HDL: 42 mg/dL (ref >39)
LDL CALC: 92 mg/dL (ref 0–99)
Triglycerides: 125 mg/dL (ref 0–149)
VLDL CHOLESTEROL CAL: 25 mg/dL (ref 5–40)

## 2016-05-29 LAB — HEPATIC FUNCTION PANEL
ALK PHOS: 63 IU/L (ref 39–117)
ALT: 19 IU/L (ref 0–44)
AST: 19 IU/L (ref 0–40)
Albumin: 4.1 g/dL (ref 3.5–5.5)
BILIRUBIN, DIRECT: 0.06 mg/dL (ref 0.00–0.40)
Bilirubin Total: <0.2 mg/dL (ref 0.0–1.2)
TOTAL PROTEIN: 6.5 g/dL (ref 6.0–8.5)

## 2016-06-04 ENCOUNTER — Ambulatory Visit (INDEPENDENT_AMBULATORY_CARE_PROVIDER_SITE_OTHER): Payer: BLUE CROSS/BLUE SHIELD | Admitting: Physician Assistant

## 2016-06-04 VITALS — BP 120/80 | HR 88 | Temp 99.1°F | Resp 17 | Ht 68.0 in | Wt 186.0 lb

## 2016-06-04 DIAGNOSIS — J069 Acute upper respiratory infection, unspecified: Secondary | ICD-10-CM

## 2016-06-04 DIAGNOSIS — R059 Cough, unspecified: Secondary | ICD-10-CM

## 2016-06-04 DIAGNOSIS — R05 Cough: Secondary | ICD-10-CM | POA: Diagnosis not present

## 2016-06-04 MED ORDER — BENZONATATE 200 MG PO CAPS: 200.0000 mg | ORAL_CAPSULE | Freq: Two times a day (BID) | ORAL | 0 refills | Status: DC | PRN

## 2016-06-04 MED ORDER — ACETAMINOPHEN-CODEINE 120-12 MG/5ML PO SUSP: ORAL | 0 refills | Status: DC

## 2016-06-04 MED ORDER — LEVOCETIRIZINE DIHYDROCHLORIDE 5 MG PO TABS: 5.0000 mg | ORAL_TABLET | Freq: Every evening | ORAL | 1 refills | Status: DC

## 2016-06-04 NOTE — Patient Instructions (Signed)
     IF you received an x-ray today, you will receive an invoice from [facility]. Please contact [facility] at [phone] with questions or concerns regarding your invoice.   IF you received labwork today, you will receive an invoice from [facility]. Please contact [facility] at [phone] with questions or concerns regarding your invoice.   Our billing staff will not be able to assist you with questions regarding bills from these companies.  You will be contacted with the lab results as soon as they are available. The fastest way to get your results is to activate your My Chart account. Instructions are located on the last page of this paperwork. If you have not heard from us regarding the results in 2 weeks, please contact this office.     

## 2016-06-04 NOTE — Progress Notes (Signed)
06/04/2016 9:50 AM   DOB: 06/19/1955 / MRN: [id number]  SUBJECTIVE:  [name] is a 61 y.o. male with a history of diabetes presenting for cough. Tells me he was sick with "brohnchitis" a few weeks back and was given medication and felt great. This cough started yesterday and associates eye watering, nasal congestion and nasal itching.  Denies sneezing, ear nd throat itching.  He has tried his steroid nasal spray yesterday. Also took some codeine cough syrup and this did not help. He has been around his child who is also "coughing and hacking."  He is allergic to contrast media [iodinated diagnostic agents].   He  has a past medical history of Arthritis; Bladder disorder; Cancer ([place]); Chronic myelocytic leukemia ([place]); Coronary artery disease; Coronary atherosclerosis of native coronary artery; Diverticulosis; H/O colonoscopy (2014); Hyperlipidemia; Hypertension; Myocardial infarction (02,6/12); and Prostate cancer ([place]).    He  reports that he quit smoking about 16 years ago. His smoking use included Cigarettes. He has never used smokeless tobacco. He reports that he does not drink alcohol or use drugs. He  has no sexual activity history on file. The patient  has a past surgical history that includes Carotid stent; Tonsillectomy; Fracture surgery; Toe Surgery; Wrist surgery; ORIF ankle fracture (07/04/2011); Cardiac catheterization; and left heart catheterization with coronary angiogram (N/A, 02/04/2012).  His family history includes Cancer in his mother.  Review of Systems  Constitutional: Negative for chills, diaphoresis and fever.  Respiratory: Negative for cough, hemoptysis, sputum production, shortness of breath and wheezing.   Cardiovascular: Negative for chest pain, orthopnea and leg swelling.  Gastrointestinal: Negative for nausea.  Skin: Negative for rash.  Neurological: Negative for dizziness.    The problem list and medications were reviewed and updated by myself where necessary  and exist elsewhere in the encounter.   OBJECTIVE:  BP 120/80   Pulse 88   Temp 99.1 F (37.3 C) (Oral)   Resp 17   Ht 5\' 8"  (1.727 m)   Wt 186 lb (84.4 kg)   SpO2 97%   BMI 28.28 kg/m   Physical Exam  Constitutional: He is active and cooperative.  HENT:  Right Ear: Hearing, tympanic membrane, external ear and ear canal normal.  Left Ear: Hearing, tympanic membrane, external ear and ear canal normal.  Nose: Nose normal. Right sinus exhibits no maxillary sinus tenderness and no frontal sinus tenderness. Left sinus exhibits no maxillary sinus tenderness and no frontal sinus tenderness.  Mouth/Throat: Uvula is midline, oropharynx is clear and moist and mucous membranes are normal. No oropharyngeal exudate, posterior oropharyngeal edema or tonsillar abscesses.  Eyes: Conjunctivae are normal. Pupils are equal, round, and reactive to light.  Cardiovascular: Normal rate, regular rhythm, S1 normal, S2 normal, normal heart sounds, intact distal pulses and normal pulses.  Exam reveals no gallop and no friction rub.   No murmur heard. Pulmonary/Chest: Effort normal. No stridor. No tachypnea. No respiratory distress. He has no wheezes. He has no rales.  Abdominal: He exhibits no distension.  Musculoskeletal: He exhibits no edema.  Lymphadenopathy:       Head (right side): No submandibular and no tonsillar adenopathy present.       Head (left side): No submandibular and no tonsillar adenopathy present.    He has no cervical adenopathy.  Neurological: He is alert.  Skin: Skin is warm.  Vitals reviewed.   No results found for this or any previous visit (from the past 72 hour(s)).  No results found.  ASSESSMENT AND  PLAN:  [name] was seen today for cough and allergies.  Diagnoses and all orders for this visit:  Acute URI: Viral vs Allergic.  Will cover for both.  He will keep the nasal steroid on board for the next five days.  He will come back in if he has any problems.  -      levocetirizine (XYZAL) 5 MG tablet; Take 1 tablet (5 mg total) by mouth every evening. -     benzonatate (TESSALON) 200 MG capsule; Take 1 capsule (200 mg total) by mouth 2 (two) times daily as needed for cough. -     acetaminophen-codeine 120-12 MG/5ML suspension; Take every 8 hours for cough.    The patient is advised to call or return to clinic if he does not see an improvement in symptoms, or to seek the care of the closest emergency department if he worsens with the above plan.   Philis Fendt, MHS, PA-C [facility] 06/04/2016 9:50 AM

## 2016-06-14 ENCOUNTER — Telehealth: Payer: Self-pay | Admitting: Internal Medicine

## 2016-06-14 ENCOUNTER — Ambulatory Visit (HOSPITAL_BASED_OUTPATIENT_CLINIC_OR_DEPARTMENT_OTHER): Payer: BLUE CROSS/BLUE SHIELD | Admitting: Internal Medicine

## 2016-06-14 ENCOUNTER — Other Ambulatory Visit: Payer: BLUE CROSS/BLUE SHIELD

## 2016-06-14 ENCOUNTER — Encounter: Payer: Self-pay | Admitting: Internal Medicine

## 2016-06-14 VITALS — BP 111/79 | HR 74 | Temp 98.6°F | Resp 18 | Ht 68.0 in | Wt 185.2 lb

## 2016-06-14 DIAGNOSIS — C9210 Chronic myeloid leukemia, BCR/ABL-positive, not having achieved remission: Secondary | ICD-10-CM

## 2016-06-14 DIAGNOSIS — Z8546 Personal history of malignant neoplasm of prostate: Secondary | ICD-10-CM | POA: Diagnosis not present

## 2016-06-14 LAB — CBC WITH DIFFERENTIAL/PLATELET
BASO%: 1.6 % (ref 0.0–2.0)
BASOS ABS: 0.1 10*3/uL (ref 0.0–0.1)
EOS ABS: 0.1 10*3/uL (ref 0.0–0.5)
EOS%: 1.6 % (ref 0.0–7.0)
HCT: 41 % (ref 38.4–49.9)
HGB: 14.2 g/dL (ref 13.0–17.1)
LYMPH%: 44.6 % (ref 14.0–49.0)
MCH: 32.6 pg (ref 27.2–33.4)
MCHC: 34.5 g/dL (ref 32.0–36.0)
MCV: 94.6 fL (ref 79.3–98.0)
MONO#: 0.5 10*3/uL (ref 0.1–0.9)
MONO%: 8.8 % (ref 0.0–14.0)
NEUT#: 2.4 10*3/uL (ref 1.5–6.5)
NEUT%: 43.4 % (ref 39.0–75.0)
Platelets: 208 10*3/uL (ref 140–400)
RBC: 4.34 10*6/uL (ref 4.20–5.82)
RDW: 13.5 % (ref 11.0–14.6)
WBC: 5.6 10*3/uL (ref 4.0–10.3)
lymph#: 2.5 10*3/uL (ref 0.9–3.3)

## 2016-06-14 LAB — COMPREHENSIVE METABOLIC PANEL
ALK PHOS: 58 U/L (ref 40–150)
ALT: 16 U/L (ref 0–55)
AST: 16 U/L (ref 5–34)
Albumin: 3.9 g/dL (ref 3.5–5.0)
Anion Gap: 8 mEq/L (ref 3–11)
BUN: 9.4 mg/dL (ref 7.0–26.0)
CHLORIDE: 109 meq/L (ref 98–109)
CO2: 26 meq/L (ref 22–29)
Calcium: 9.2 mg/dL (ref 8.4–10.4)
Creatinine: 1.2 mg/dL (ref 0.7–1.3)
EGFR: 79 mL/min/{1.73_m2} — AB (ref >90)
Glucose: 108 mg/dl (ref 70–140)
POTASSIUM: 4.1 meq/L (ref 3.5–5.1)
SODIUM: 142 meq/L (ref 136–145)
Total Bilirubin: 0.42 mg/dL (ref 0.20–1.20)
Total Protein: 6.6 g/dL (ref 6.4–8.3)

## 2016-06-14 NOTE — Telephone Encounter (Signed)
Appointments scheduled per 4.12.18 LOS. Patient given AVS report and calendars with future scheduled appointments. °

## 2016-06-14 NOTE — Progress Notes (Signed)
[name] Telephone:([phone]   Fax:(336) [phone]  OFFICE PROGRESS NOTE  [name], [address] [place] [address]  DIAGNOSIS:  1) Chronic myeloid leukemia diagnosed in November of 2014. 2) History of prostate adenocarcinoma diagnosed in 2006  PRIOR THERAPY: Status post prostatectomy.  CURRENT THERAPY: Gleevec 400 mg by mouth daily, started 02/10/2013.  INTERVAL HISTORY: [name] 61 y.o. male returns to the clinic today for follow-up visit. The patient is feeling fine today except for fatigue. He works 3 jobs. He denied having any chest pain or shortness of breath but has residual cough from recent bronchitis. He denied having any weight loss or night sweats. He has no nausea, vomiting, diarrhea or constipation. He denied having any current fever or chills. He is here today for reevaluation with repeat blood work.  MEDICAL HISTORY: Past Medical History:  Diagnosis Date  . Arthritis   . Bladder disorder   . Cancer ([place])    hx prostate cancer and CML  . Chronic myelocytic leukemia ([place])   . Coronary artery disease    with prior MI 2002(BMS) and Cfx(2006) and RCA (2004) stents . EF 45-50%  . Coronary atherosclerosis of native coronary artery    stable and suspect a component of CAS  . Diverticulosis    seen on colonoscopy in 2008  . H/O colonoscopy 2014  . Hyperlipidemia   . Hypertension   . Myocardial infarction 02,6/12  . Prostate cancer ([place])     ALLERGIES:  is allergic to contrast media [iodinated diagnostic agents].  MEDICATIONS:  Current Outpatient Prescriptions  Medication Sig Dispense Refill  . acetaminophen-codeine 120-12 MG/5ML suspension Take every 8 hours for cough. 120 mL 0  . Ascorbic Acid (VITAMIN C) 1000 MG tablet Take 1,000 mg by mouth daily.    Marland Kitchen aspirin 81 MG chewable tablet Chew 162 mg by mouth daily.     . benzonatate (TESSALON) 200 MG capsule Take 1 capsule (200 mg total) by mouth 2  (two) times daily as needed for cough. 20 capsule 0  . cholecalciferol (VITAMIN D) 1000 UNITS tablet Take 5,000 Units by mouth daily.     . Coenzyme Q10 (COQ10) 100 MG CAPS Take 100 mg by mouth daily.    Marland Kitchen darifenacin (ENABLEX) 15 MG 24 hr tablet Take 15 mg by mouth daily.     Marland Kitchen dexlansoprazole (DEXILANT) 60 MG capsule Take 60 mg by mouth daily.    . fluticasone (FLONASE) 50 MCG/ACT nasal spray Place 2 sprays into both nostrils daily as needed for allergies or rhinitis (congestion).    . hydrOXYzine (ATARAX/VISTARIL) 25 MG tablet Take 50 mg by mouth daily.    Marland Kitchen imatinib (GLEEVEC) 400 MG tablet Take 1 tablet (400 mg total) by mouth daily. Take with meals and large glass of water.Caution:Chemotherapy. 90 tablet 2  . isosorbide mononitrate (IMDUR) 60 MG 24 hr tablet TAKE ONE TABLET BY MOUTH ONCE DAILY 90 tablet 3  . levocetirizine (XYZAL) 5 MG tablet Take 1 tablet (5 mg total) by mouth every evening. 30 tablet 1  . magnesium oxide (MAG-OX) 400 MG tablet Take 400 mg by mouth daily as needed (cramps).    . metoprolol succinate (TOPROL-XL) 100 MG 24 hr tablet TAKE ONE TABLET BY MOUTH WITH MEALS OR  IMMEDIATELY  FOLLOWING 90 tablet 3  . MYRBETRIQ 50 MG TB24 tablet daily.  1  . nitroGLYCERIN (NITROLINGUAL) 0.4 MG/SPRAY spray Place 1 spray under the tongue every  5 (five) minutes x 3 doses as needed for chest pain. 12 g 2  . rosuvastatin (CRESTOR) 20 MG tablet Take 1 tablet (20 mg total) by mouth daily. 90 tablet 3  . valsartan (DIOVAN) 40 MG tablet TAKE ONE TABLET BY MOUTH ONCE DAILY 90 tablet 3   No current facility-administered medications for this visit.     SURGICAL HISTORY:  Past Surgical History:  Procedure Laterality Date  . CARDIAC CATHETERIZATION     normal EF, patent stents without obstructive disease (on Plavix X 1 month only)  . CAROTID STENT    . FRACTURE SURGERY     rt wrist  . LEFT HEART CATHETERIZATION WITH CORONARY ANGIOGRAM N/A 02/04/2012   Procedure: LEFT HEART CATHETERIZATION  WITH CORONARY ANGIOGRAM;  Surgeon: Sinclair Grooms, MD;  Location: [facility] CATH LAB;  Service: Cardiovascular;  Laterality: N/A;  . ORIF ANKLE FRACTURE  07/04/2011   Procedure: OPEN REDUCTION INTERNAL FIXATION (ORIF) ANKLE FRACTURE;  Surgeon: Alta Corning, MD;  Location: [facility];  Service: Orthopedics;  Laterality: Right;  . TOE SURGERY     rt great toe  . TONSILLECTOMY    . WRIST SURGERY     rt and lt cysts removed    REVIEW OF SYSTEMS:  A comprehensive review of systems was negative except for: Constitutional: positive for fatigue   PHYSICAL EXAMINATION: General appearance: alert, cooperative and no distress Head: Normocephalic, without obvious abnormality, atraumatic Neck: no adenopathy, no JVD, supple, symmetrical, trachea midline and thyroid not enlarged, symmetric, no tenderness/mass/nodules Lymph nodes: Cervical, supraclavicular, and axillary nodes normal. Resp: clear to auscultation bilaterally Back: symmetric, no curvature. ROM normal. No CVA tenderness. Cardio: regular rate and rhythm, S1, S2 normal, no murmur, click, rub or gallop GI: soft, non-tender; bowel sounds normal; no masses,  no organomegaly Extremities: extremities normal, atraumatic, no cyanosis or edema    ECOG PERFORMANCE STATUS: 0 - Asymptomatic  Blood pressure 111/79, pulse 74, temperature 98.6 F (37 C), temperature source Oral, resp. rate 18, height 5\' 8"  (1.727 m), weight 185 lb 3.2 oz (84 kg), SpO2 100 %.  LABORATORY DATA: Lab Results  Component Value Date   WBC 6.5 03/07/2016   HGB 13.2 03/07/2016   HCT 37.2 (L) 03/07/2016   MCV 91.4 03/07/2016   PLT 190 03/07/2016      Chemistry      Component Value Date/Time   NA 142 03/07/2016 1604   K 3.9 03/07/2016 1604   CL 105 12/13/2013 1045   CO2 24 03/07/2016 1604   BUN 13.4 03/07/2016 1604   CREATININE 1.3 03/07/2016 1604      Component Value Date/Time   CALCIUM 9.0 03/07/2016 1604   ALKPHOS 63 05/28/2016 1533   ALKPHOS 67  03/07/2016 1604   AST 19 05/28/2016 1533   AST 15 03/07/2016 1604   ALT 19 05/28/2016 1533   ALT 11 03/07/2016 1604   BILITOT <0.2 05/28/2016 1533   BILITOT 0.24 03/07/2016 1604       RADIOGRAPHIC STUDIES: No results found.  ASSESSMENT AND PLAN:  This is a very pleasant 61 years old African-American male with chronic myeloid leukemia currently on treatment with Gleevec 400 mg by mouth daily since December 2014 and has been tolerating the treatment fairly well. His recent molecular study for BCR/ABL showed mild increase in the copy number. His blood work today is still pending. I recommended for the patient to continue his current treatment with [place] for now. I will see him back for  follow-up visit in 3 months for evaluation after repeating CBC, comprehensive metabolic panel, LDH as well as molecular study for BCR ABL. He was advised to call immediately if he has any concerning symptoms in the interval. The patient voices understanding of current disease status and treatment options and is in agreement with the current care plan.  All questions were answered. The patient knows to call the clinic with any problems, questions or concerns. We can certainly see the patient much sooner if necessary. I spent 10 minutes counseling the patient face to face. The total time spent in the appointment was 15 minutes.  Disclaimer: This note was dictated with voice recognition software. Similar sounding words can inadvertently be transcribed and may not be corrected upon review.

## 2016-08-13 ENCOUNTER — Other Ambulatory Visit: Payer: Self-pay | Admitting: Physician Assistant

## 2016-08-13 DIAGNOSIS — J069 Acute upper respiratory infection, unspecified: Secondary | ICD-10-CM

## 2016-09-06 ENCOUNTER — Other Ambulatory Visit (HOSPITAL_BASED_OUTPATIENT_CLINIC_OR_DEPARTMENT_OTHER): Payer: BLUE CROSS/BLUE SHIELD

## 2016-09-06 DIAGNOSIS — C9210 Chronic myeloid leukemia, BCR/ABL-positive, not having achieved remission: Secondary | ICD-10-CM

## 2016-09-06 LAB — LACTATE DEHYDROGENASE: LDH: 198 U/L (ref 125–245)

## 2016-09-06 LAB — CBC WITH DIFFERENTIAL/PLATELET
BASO%: 6.4 % — ABNORMAL HIGH (ref 0.0–2.0)
BASOS ABS: 0.4 10*3/uL — AB (ref 0.0–0.1)
EOS ABS: 0.1 10*3/uL (ref 0.0–0.5)
EOS%: 1.8 % (ref 0.0–7.0)
HEMATOCRIT: 38.5 % (ref 38.4–49.9)
HEMOGLOBIN: 13.3 g/dL (ref 13.0–17.1)
LYMPH#: 2.4 10*3/uL (ref 0.9–3.3)
LYMPH%: 36.9 % (ref 14.0–49.0)
MCH: 32.6 pg (ref 27.2–33.4)
MCHC: 34.5 g/dL (ref 32.0–36.0)
MCV: 94.4 fL (ref 79.3–98.0)
MONO#: 0.9 10*3/uL (ref 0.1–0.9)
MONO%: 13.9 % (ref 0.0–14.0)
NEUT#: 2.7 10*3/uL (ref 1.5–6.5)
NEUT%: 41 % (ref 39.0–75.0)
PLATELETS: 159 10*3/uL (ref 140–400)
RBC: 4.08 10*6/uL — ABNORMAL LOW (ref 4.20–5.82)
RDW: 15.3 % — ABNORMAL HIGH (ref 11.0–14.6)
WBC: 6.6 10*3/uL (ref 4.0–10.3)
nRBC: 0 % (ref 0–0)

## 2016-09-06 LAB — COMPREHENSIVE METABOLIC PANEL
ALT: 31 U/L (ref 0–55)
ANION GAP: 12 meq/L — AB (ref 3–11)
AST: 22 U/L (ref 5–34)
Albumin: 3.9 g/dL (ref 3.5–5.0)
Alkaline Phosphatase: 96 U/L (ref 40–150)
BUN: 9.4 mg/dL (ref 7.0–26.0)
CALCIUM: 9.4 mg/dL (ref 8.4–10.4)
CHLORIDE: 108 meq/L (ref 98–109)
CO2: 23 mEq/L (ref 22–29)
Creatinine: 1.2 mg/dL (ref 0.7–1.3)
EGFR: 78 mL/min/{1.73_m2} — ABNORMAL LOW (ref >90)
Glucose: 98 mg/dl (ref 70–140)
POTASSIUM: 3.8 meq/L (ref 3.5–5.1)
Sodium: 143 mEq/L (ref 136–145)
Total Bilirubin: 0.47 mg/dL (ref 0.20–1.20)
Total Protein: 6.7 g/dL (ref 6.4–8.3)

## 2016-09-06 LAB — TECHNOLOGIST REVIEW: Technologist Review: 1

## 2016-09-11 ENCOUNTER — Telehealth: Payer: Self-pay | Admitting: *Deleted

## 2016-09-11 NOTE — Telephone Encounter (Signed)
Sent this message to scheduling

## 2016-09-11 NOTE — Telephone Encounter (Signed)
"  I need to reschedule appointment 09-13-2016 at 8:45 am to later in the day.  Didn't realize I double booked myself for two doctor appointments.  The 9:00 can't be rescheduled for six months is why I am trying to reschedule for later in the day to see Dr. [name].  If this day is not available, I am available any time on Tuesday, Wednesday or Thursday.  If he can see me early this Friday, I can notify my job I'll be arriving late.  Work starts at 8:00 am is why I normally am not available Monday's or Friday's.  Return number [phone]."   Will notify provider to advise scheduler on rescheduling patient appointment.

## 2016-09-12 ENCOUNTER — Telehealth: Payer: Self-pay | Admitting: Internal Medicine

## 2016-09-12 NOTE — Telephone Encounter (Signed)
R/s appt per sch message from [place] 7/10 - patient is aware of time change.

## 2016-09-13 ENCOUNTER — Ambulatory Visit: Payer: BLUE CROSS/BLUE SHIELD | Admitting: Internal Medicine

## 2016-09-13 ENCOUNTER — Ambulatory Visit (HOSPITAL_BASED_OUTPATIENT_CLINIC_OR_DEPARTMENT_OTHER): Payer: BLUE CROSS/BLUE SHIELD | Admitting: Internal Medicine

## 2016-09-13 ENCOUNTER — Telehealth: Payer: Self-pay | Admitting: Internal Medicine

## 2016-09-13 ENCOUNTER — Telehealth: Payer: Self-pay | Admitting: Pharmacist

## 2016-09-13 ENCOUNTER — Encounter: Payer: Self-pay | Admitting: Internal Medicine

## 2016-09-13 VITALS — BP 144/91 | HR 82 | Temp 97.8°F | Resp 18 | Ht 68.0 in | Wt 183.6 lb

## 2016-09-13 DIAGNOSIS — C9210 Chronic myeloid leukemia, BCR/ABL-positive, not having achieved remission: Secondary | ICD-10-CM

## 2016-09-13 DIAGNOSIS — Z7189 Other specified counseling: Secondary | ICD-10-CM

## 2016-09-13 DIAGNOSIS — Z5111 Encounter for antineoplastic chemotherapy: Secondary | ICD-10-CM

## 2016-09-13 DIAGNOSIS — Z8546 Personal history of malignant neoplasm of prostate: Secondary | ICD-10-CM | POA: Diagnosis not present

## 2016-09-13 DIAGNOSIS — I10 Essential (primary) hypertension: Secondary | ICD-10-CM

## 2016-09-13 HISTORY — DX: Other specified counseling: Z71.89

## 2016-09-13 MED ORDER — NILOTINIB HCL 200 MG PO CAPS: 400.0000 mg | ORAL_CAPSULE | Freq: Two times a day (BID) | ORAL | 2 refills | Status: DC

## 2016-09-13 MED ORDER — NILOTINIB HCL 200 MG PO CAPS: 400.0000 mg | ORAL_CAPSULE | Freq: Two times a day (BID) | ORAL | 0 refills | Status: DC

## 2016-09-13 MED FILL — TASIGNA 200 MG CAPSULE: 200 | 84 days supply | Qty: 336 | Fill #0

## 2016-09-13 NOTE — Progress Notes (Signed)
[place] Telephone:(336) [phone]   Fax:(336) [phone]  OFFICE PROGRESS NOTE  [name], [address] [place] [address]  DIAGNOSIS:  1) Chronic myeloid leukemia diagnosed in November of 2014. 2) History of prostate adenocarcinoma diagnosed in 2006  PRIOR THERAPY:  1) Status post prostatectomy. 2) Gleevec 400 mg by mouth daily, started 02/10/2013, discontinued on 09/13/2016 secondary to disease progression.  CURRENT THERAPY: Tasigna 400 mg by mouth twice a day. Expected to start in the next few days.  INTERVAL HISTORY: [name] 61 y.o. male returns to the clinic today for follow-up visit. The patient is feeling fine today with no specific complaints except for mild fatigue and arthralgia. He denied having any chest pain, shortness breath, cough or hemoptysis. He denied having any fever or chills. He has no nausea, vomiting, diarrhea or constipation. He denied having any weight loss or night sweats. The patient had repeat CBC, comprehensive metabolic panel, LDH as well as molecular studies for BCR/ABL performed recently and he is here for evaluation and discussion of his lab results and treatment options.   MEDICAL HISTORY: Past Medical History:  Diagnosis Date  . Arthritis   . Bladder disorder   . Cancer ([place])    hx prostate cancer and CML  . Chronic myelocytic leukemia ([place])   . Coronary artery disease    with prior MI 2002(BMS) and Cfx(2006) and RCA (2004) stents . EF 45-50%  . Coronary atherosclerosis of native coronary artery    stable and suspect a component of CAS  . Diverticulosis    seen on colonoscopy in 2008  . H/O colonoscopy 2014  . Hyperlipidemia   . Hypertension   . Myocardial infarction ([place]) 02,6/12  . Prostate cancer ([place])     ALLERGIES:  is allergic to contrast media [iodinated diagnostic agents].  MEDICATIONS:  Current Outpatient Prescriptions  Medication Sig Dispense Refill  .  acetaminophen-codeine 120-12 MG/5ML suspension Take every 8 hours for cough. (Patient not taking: Reported on 06/14/2016) 120 mL 0  . Ascorbic Acid (VITAMIN C) 1000 MG tablet Take 1,000 mg by mouth daily.    Marland Kitchen aspirin 81 MG chewable tablet Chew 162 mg by mouth daily.     . benzonatate (TESSALON) 200 MG capsule TAKE 1 CAPSULE BY MOUTH TWICE DAILY AS NEEDED FOR COUGH 20 capsule 0  . cholecalciferol (VITAMIN D) 1000 UNITS tablet Take 5,000 Units by mouth daily.     . Coenzyme Q10 (COQ10) 100 MG CAPS Take 100 mg by mouth daily.    Marland Kitchen darifenacin (ENABLEX) 15 MG 24 hr tablet Take 15 mg by mouth daily.     Marland Kitchen dexlansoprazole (DEXILANT) 60 MG capsule Take 60 mg by mouth daily.    . fluticasone (FLONASE) 50 MCG/ACT nasal spray Place 2 sprays into both nostrils daily as needed for allergies or rhinitis (congestion).    . hydrOXYzine (ATARAX/VISTARIL) 25 MG tablet Take 50 mg by mouth daily.    Marland Kitchen imatinib (GLEEVEC) 400 MG tablet Take 1 tablet (400 mg total) by mouth daily. Take with meals and large glass of water.Caution:Chemotherapy. 90 tablet 2  . isosorbide mononitrate (IMDUR) 60 MG 24 hr tablet TAKE ONE TABLET BY MOUTH ONCE DAILY 90 tablet 3  . levocetirizine (XYZAL) 5 MG tablet Take 1 tablet (5 mg total) by mouth every evening. (Patient not taking: Reported on 06/14/2016) 30 tablet 1  . magnesium oxide (MAG-OX) 400 MG tablet Take 400 mg by mouth daily as  needed (cramps).    . metoprolol succinate (TOPROL-XL) 100 MG 24 hr tablet TAKE ONE TABLET BY MOUTH WITH MEALS OR  IMMEDIATELY  FOLLOWING 90 tablet 3  . MYRBETRIQ 50 MG TB24 tablet daily.  1  . nitroGLYCERIN (NITROLINGUAL) 0.4 MG/SPRAY spray Place 1 spray under the tongue every 5 (five) minutes x 3 doses as needed for chest pain. (Patient not taking: Reported on 06/14/2016) 12 g 2  . rosuvastatin (CRESTOR) 20 MG tablet Take 1 tablet (20 mg total) by mouth daily. 90 tablet 3  . valsartan (DIOVAN) 40 MG tablet TAKE ONE TABLET BY MOUTH ONCE DAILY 90 tablet 3    No current facility-administered medications for this visit.     SURGICAL HISTORY:  Past Surgical History:  Procedure Laterality Date  . CARDIAC CATHETERIZATION     normal EF, patent stents without obstructive disease (on Plavix X 1 month only)  . CAROTID STENT    . FRACTURE SURGERY     rt wrist  . LEFT HEART CATHETERIZATION WITH CORONARY ANGIOGRAM N/A 02/04/2012   Procedure: LEFT HEART CATHETERIZATION WITH CORONARY ANGIOGRAM;  Surgeon: Sinclair Grooms, MD;  Location: [facility] CATH LAB;  Service: Cardiovascular;  Laterality: N/A;  . ORIF ANKLE FRACTURE  07/04/2011   Procedure: OPEN REDUCTION INTERNAL FIXATION (ORIF) ANKLE FRACTURE;  Surgeon: Alta Corning, MD;  Location: [facility];  Service: Orthopedics;  Laterality: Right;  . TOE SURGERY     rt great toe  . TONSILLECTOMY    . WRIST SURGERY     rt and lt cysts removed    REVIEW OF SYSTEMS:  Constitutional: positive for fatigue Eyes: negative Ears, nose, mouth, throat, and face: negative Respiratory: negative Cardiovascular: negative Gastrointestinal: negative Genitourinary:negative Integument/breast: negative Hematologic/lymphatic: negative Musculoskeletal:positive for arthralgias Neurological: negative Behavioral/Psych: negative Endocrine: negative Allergic/Immunologic: negative   PHYSICAL EXAMINATION: General appearance: alert, cooperative, fatigued and no distress Head: Normocephalic, without obvious abnormality, atraumatic Neck: no adenopathy, no JVD, supple, symmetrical, trachea midline and thyroid not enlarged, symmetric, no tenderness/mass/nodules Lymph nodes: Cervical, supraclavicular, and axillary nodes normal. Resp: clear to auscultation bilaterally Back: symmetric, no curvature. ROM normal. No CVA tenderness. Cardio: regular rate and rhythm, S1, S2 normal, no murmur, click, rub or gallop GI: soft, non-tender; bowel sounds normal; no masses,  no organomegaly Extremities: extremities normal,  atraumatic, no cyanosis or edema Neurologic: Alert and oriented X 3, normal strength and tone. Normal symmetric reflexes. Normal coordination and gait    ECOG PERFORMANCE STATUS: 0 - Asymptomatic  Blood pressure (!) 144/91, pulse 82, temperature 97.8 F (36.6 C), temperature source Oral, resp. rate 18, height 5\' 8"  (1.727 m), weight 183 lb 9.6 oz (83.3 kg), SpO2 100 %.  LABORATORY DATA: Lab Results  Component Value Date   WBC 6.6 09/06/2016   HGB 13.3 09/06/2016   HCT 38.5 09/06/2016   MCV 94.4 09/06/2016   PLT 159 09/06/2016      Chemistry      Component Value Date/Time   NA 143 09/06/2016 0753   K 3.8 09/06/2016 0753   CL 105 12/13/2013 1045   CO2 23 09/06/2016 0753   BUN 9.4 09/06/2016 0753   CREATININE 1.2 09/06/2016 0753      Component Value Date/Time   CALCIUM 9.4 09/06/2016 0753   ALKPHOS 96 09/06/2016 0753   AST 22 09/06/2016 0753   ALT 31 09/06/2016 0753   BILITOT 0.47 09/06/2016 0753       RADIOGRAPHIC STUDIES: No results found.  ASSESSMENT AND PLAN:  This is a very pleasant 61 years old African-American male with chronic myeloid leukemia diagnosed in November 2014 and has been on treatment with Gleevec 400 mg by mouth daily since that time and has been tolerating the treatment well. Unfortunately his recent molecular study showed further increase in the BCR/ABL indicating molecular relapse but morphologically the patient continues to do well. I discussed the lab result with the patient today. I recommended for him to discontinue treatment with Gleevec at this point. I recommended for him treatment with Tasigna 400 mg by mouth twice a day. I discussed with the patient adverse effect of this treatment and he will receive chemotherapy education from the oral pharmacist. I will arrange for the patient to have EKG today and one week after starting the treatment. I will continue to monitor his blood count closely. He would come back for follow-up visit and lives  in 2 weeks for reevaluation and management of any adverse effect of his treatment. The patient agreed to the current plan. He was advised to call immediately if he has any concerning symptoms in the interval. The patient voices understanding of current disease status and treatment options and is in agreement with the current care plan.  All questions were answered. The patient knows to call the clinic with any problems, questions or concerns. We can certainly see the patient much sooner if necessary. I spent 15 minutes counseling the patient face to face. The total time spent in the appointment was 25 minutes.   Disclaimer: This note was dictated with voice recognition software. Similar sounding words can inadvertently be transcribed and may not be corrected upon review.

## 2016-09-13 NOTE — Telephone Encounter (Signed)
Oral Chemotherapy Pharmacist Encounter  Received notification from MD that he plans to start Tasigna for the treatment of [place] a patient who has evidence of disease progression on Gleevec.  09/06/16 labs reviewed, OK for treatment 09/13/16 EKG shows QTc of 433 msec, this is safe for treatment initiation  Current medication list in Epic assessed, some DDIs with Tasigna identified:  Tasigna and Dexilant: category D interaction, inconsistent data for decreased absorption with increasing stomach pH, no change to current therapy indicated at this time  Tasigna and Atarax: Category D interaction due to QTc prolongation. QTc assessed in office today. Plan for repeat EKG at office visit on 09/27/16.  I spoke with patient in exam room for overview of new oral chemotherapy medication: Tasigna. Pt is doing well.   The prescriptions have been sent to [facility] in [place], [place] as this is where patient was obtaining his [place].  A separate prescription has been e-scribed to the [facility] for the use of a manufacturer voucher for 90 days of treatment at $0 cost to the patient. This will be ordered at the pharmacy so that patient may start on therapy immediately as well obtain insurance authorization and copayment assistance if needed.  Patient noted he will have new prescription insurance coverage in 90 days due to changing jobs. He knows to reach out to the office with any issues that may arise from this change.   Counseled patient on administration, dosing, side effects, safe handling, and monitoring. Patient will take Tasigna 200mg  capsules, 2 capsules (400mg  total) by mouth 2 times daily on an empty stomach, either 1 hour before or 2 hours after a meal.  Patient and I developed plan for Tasigna AM dose when he wakes up and PM dose at bedtime to ensure empty stomach.  Side effects include but not limited to: fatigue, pyrexia, joint pain, rash, and GI upset.   Mr. Litton voiced understanding and appreciation.   All questions answered.  Will follow up with patient regarding insurance and pharmacy. Patient knows to call the office with questions or concerns.  Oral Oncology Clinic will continue to follow.  Thank you,  Johny Drilling, PharmD, BCPS, BCOP 09/13/2016  4:01 PM Oral Oncology Clinic [phone]

## 2016-09-13 NOTE — Telephone Encounter (Signed)
Scheduled appt per 7/12 los - per MM okay for 7/26 due to availability - Gave patient AVS and calender per los.

## 2016-09-25 ENCOUNTER — Telehealth: Payer: Self-pay | Admitting: Pharmacy Technician

## 2016-09-25 NOTE — Telephone Encounter (Signed)
Oral Oncology Patient Advocate Encounter  Received notification from [place] that prior authorization for Tasigna is required.  PA submitted on CoverMyMeds Key: [id number] Status is pending  Oral Oncology Clinic will continue to follow.  Fabio Asa. Melynda Keller, [place] Oral Oncology Clinic Patient Advocate [phone] 09/25/2016 10:49 AM

## 2016-09-27 ENCOUNTER — Other Ambulatory Visit (HOSPITAL_BASED_OUTPATIENT_CLINIC_OR_DEPARTMENT_OTHER): Payer: BLUE CROSS/BLUE SHIELD

## 2016-09-27 ENCOUNTER — Ambulatory Visit (HOSPITAL_BASED_OUTPATIENT_CLINIC_OR_DEPARTMENT_OTHER): Payer: BLUE CROSS/BLUE SHIELD | Admitting: Oncology

## 2016-09-27 ENCOUNTER — Other Ambulatory Visit: Payer: Self-pay

## 2016-09-27 ENCOUNTER — Telehealth: Payer: Self-pay | Admitting: Pharmacist

## 2016-09-27 ENCOUNTER — Encounter: Payer: Self-pay | Admitting: Oncology

## 2016-09-27 VITALS — BP 111/74 | HR 76 | Temp 98.0°F | Resp 18 | Ht 68.0 in | Wt 181.2 lb

## 2016-09-27 DIAGNOSIS — Z8546 Personal history of malignant neoplasm of prostate: Secondary | ICD-10-CM

## 2016-09-27 DIAGNOSIS — D696 Thrombocytopenia, unspecified: Secondary | ICD-10-CM

## 2016-09-27 DIAGNOSIS — C9210 Chronic myeloid leukemia, BCR/ABL-positive, not having achieved remission: Secondary | ICD-10-CM

## 2016-09-27 DIAGNOSIS — I10 Essential (primary) hypertension: Secondary | ICD-10-CM

## 2016-09-27 LAB — COMPREHENSIVE METABOLIC PANEL
ALT: 33 U/L (ref 0–55)
AST: 39 U/L — AB (ref 5–34)
Albumin: 3.9 g/dL (ref 3.5–5.0)
Alkaline Phosphatase: 90 U/L (ref 40–150)
Anion Gap: 9 mEq/L (ref 3–11)
BUN: 13.2 mg/dL (ref 7.0–26.0)
CALCIUM: 9.3 mg/dL (ref 8.4–10.4)
CHLORIDE: 110 meq/L — AB (ref 98–109)
CO2: 23 meq/L (ref 22–29)
CREATININE: 1.2 mg/dL (ref 0.7–1.3)
EGFR: 79 mL/min/{1.73_m2} — ABNORMAL LOW (ref >90)
GLUCOSE: 109 mg/dL (ref 70–140)
Potassium: 4.4 mEq/L (ref 3.5–5.1)
SODIUM: 142 meq/L (ref 136–145)
Total Bilirubin: 0.61 mg/dL (ref 0.20–1.20)
Total Protein: 6.7 g/dL (ref 6.4–8.3)

## 2016-09-27 LAB — CBC WITH DIFFERENTIAL/PLATELET
BASO%: 7.4 % — AB (ref 0.0–2.0)
Basophils Absolute: 0.4 10*3/uL — ABNORMAL HIGH (ref 0.0–0.1)
EOS%: 2.5 % (ref 0.0–7.0)
Eosinophils Absolute: 0.1 10*3/uL (ref 0.0–0.5)
HEMATOCRIT: 38.4 % (ref 38.4–49.9)
HEMOGLOBIN: 13.3 g/dL (ref 13.0–17.1)
LYMPH#: 2 10*3/uL (ref 0.9–3.3)
LYMPH%: 36.1 % (ref 14.0–49.0)
MCH: 33.4 pg (ref 27.2–33.4)
MCHC: 34.6 g/dL (ref 32.0–36.0)
MCV: 96.5 fL (ref 79.3–98.0)
MONO#: 1 10*3/uL — ABNORMAL HIGH (ref 0.1–0.9)
MONO%: 17.2 % — ABNORMAL HIGH (ref 0.0–14.0)
NEUT#: 2.1 10*3/uL (ref 1.5–6.5)
NEUT%: 36.8 % — AB (ref 39.0–75.0)
Platelets: 119 10*3/uL — ABNORMAL LOW (ref 140–400)
RBC: 3.98 10*6/uL — ABNORMAL LOW (ref 4.20–5.82)
RDW: 14.9 % — AB (ref 11.0–14.6)
WBC: 5.7 10*3/uL (ref 4.0–10.3)

## 2016-09-27 LAB — LACTATE DEHYDROGENASE: LDH: 215 U/L (ref 125–245)

## 2016-09-27 LAB — TECHNOLOGIST REVIEW

## 2016-09-27 NOTE — Telephone Encounter (Signed)
Oral Chemotherapy Pharmacist Encounter  Follow-Up Form  I spoke with patient today in clinic to follow up regarding patient's oral chemotherapy medication: Tasigna for the treatment of CML in a patient with evidence of disease progression on Gleevec, planned duration until disease progression or unacceptable toxicity.  Original start date of oral chemotherapy: 09/21/16  Pt is doing well today  Pt reports 0 tablets/doses of Tasigna 200mg  capsules, 2 capsules by mouth twice daily on an empty stomach, 1 hour before or 2 hours after a meal missed in the last 6 days.  He notes that dosing schedule at wake time and bedtime is easy to follow.  Pt reports the following side effects: some nausea on days 2 and 3 patient attributed to getting overheated instead of new medication. No other side effects to report.  Other Issues: EKG performed today shoews QTc 439 msec, OK for treatment continuation with patient's Atarax.  Patient knows to call the office with questions or concerns. Oral Oncology Clinic will continue to follow.  Thank you,  Johny Drilling, PharmD, BCPS, BCOP 09/27/2016 9:13 AM Oral Oncology Clinic [phone]

## 2016-09-27 NOTE — Telephone Encounter (Signed)
Oral Oncology Patient Advocate Encounter  Prior Authorization for [name] has been approved.    PA# [id number] Effective dates: 09/25/2016 through 09/24/2017.   Oral Oncology Clinic will continue to follow.   Fabio Asa. Melynda Keller, [place] Oral Oncology Patient Advocate [phone] 09/27/2016 8:49 AM

## 2016-09-27 NOTE — Progress Notes (Signed)
[place] [phone]   [phone]  OFFICE PROGRESS NOTE  [name], [address] [address]  DIAGNOSIS:  1) Chronic myeloid leukemia diagnosed in November of 2014. 2) History of prostate adenocarcinoma diagnosed in 2006  PRIOR THERAPY:  1) Status post prostatectomy. 2) Gleevec 400 mg by mouth daily, started 02/10/2013, discontinued on 09/13/2016 secondary to disease progression.  CURRENT THERAPY: Tasigna 400 mg by mouth twice a day. Started on 09/21/2016.  INTERVAL HISTORY: [name] 61 y.o. male returns to the clinic today for follow-up visit. The patient is feeling fine today with no specific complaints except for mild fatigue and arthralgia. These have not worsened since starting treatment. He denied having any chest pain, shortness breath, cough or hemoptysis. He denied having any fever or chills. He has no nausea, vomiting, diarrhea or constipation. He denied having any weight loss or night sweats. The patient is here for evaluation of side effects related to Tasigna.   MEDICAL HISTORY: Past Medical History:  Diagnosis Date  . Arthritis   . Bladder disorder   . Cancer ([place])    hx prostate cancer and CML  . Chronic myelocytic leukemia ([place])   . Coronary artery disease    with prior MI 2002(BMS) and Cfx(2006) and RCA (2004) stents . EF 45-50%  . Coronary atherosclerosis of native coronary artery    stable and suspect a component of CAS  . Diverticulosis    seen on colonoscopy in 2008  . Goals of care, counseling/discussion 09/13/2016  . H/O colonoscopy 2014  . Hyperlipidemia   . Hypertension   . Myocardial infarction ([place]) 02,6/12  . Prostate cancer ([place])     ALLERGIES:  is allergic to contrast media [iodinated diagnostic agents].  MEDICATIONS:  Current Outpatient Prescriptions  Medication Sig Dispense Refill  . Ascorbic Acid (VITAMIN C) 1000 MG tablet Take 1,000 mg by mouth  daily.    Marland Kitchen aspirin 81 MG chewable tablet Chew 162 mg by mouth daily.     . cholecalciferol (VITAMIN D) 1000 UNITS tablet Take 5,000 Units by mouth daily.     . Coenzyme Q10 (COQ10) 100 MG CAPS Take 100 mg by mouth daily.    Marland Kitchen darifenacin (ENABLEX) 15 MG 24 hr tablet Take 15 mg by mouth daily.     Marland Kitchen dexlansoprazole (DEXILANT) 60 MG capsule Take 60 mg by mouth daily.    . fluticasone (FLONASE) 50 MCG/ACT nasal spray Place 2 sprays into both nostrils daily as needed for allergies or rhinitis (congestion).    . hydrOXYzine (ATARAX/VISTARIL) 25 MG tablet Take 50 mg by mouth daily.    . isosorbide mononitrate (IMDUR) 60 MG 24 hr tablet TAKE ONE TABLET BY MOUTH ONCE DAILY 90 tablet 3  . magnesium oxide (MAG-OX) 400 MG tablet Take 400 mg by mouth daily as needed (cramps).    . meloxicam (MOBIC) 15 MG tablet Take 15 mg by mouth daily.  1  . metoprolol succinate (TOPROL-XL) 100 MG 24 hr tablet TAKE ONE TABLET BY MOUTH WITH MEALS OR  IMMEDIATELY  FOLLOWING 90 tablet 3  . MYRBETRIQ 50 MG TB24 tablet daily.  1  . nilotinib (TASIGNA) 200 MG capsule Take 2 capsules (400 mg total) by mouth every 12 (twelve) hours. Give on an empty stomach 1 hour before or 2 hours after meals. 364 capsule 0  . valsartan (DIOVAN) 40 MG tablet TAKE ONE TABLET BY MOUTH ONCE DAILY 90 tablet 3  .  acetaminophen-codeine 120-12 MG/5ML suspension Take every 8 hours for cough. (Patient not taking: Reported on 06/14/2016) 120 mL 0  . benzonatate (TESSALON) 200 MG capsule TAKE 1 CAPSULE BY MOUTH TWICE DAILY AS NEEDED FOR COUGH (Patient not taking: Reported on 09/27/2016) 20 capsule 0  . levocetirizine (XYZAL) 5 MG tablet Take 1 tablet (5 mg total) by mouth every evening. (Patient not taking: Reported on 06/14/2016) 30 tablet 1  . nilotinib (TASIGNA) 200 MG capsule Take 2 capsules (400 mg total) by mouth every 12 (twelve) hours. Give on an empty stomach 1 hour before or 2 hours after meals. 120 capsule 2  . nitroGLYCERIN (NITROLINGUAL) 0.4  MG/SPRAY spray Place 1 spray under the tongue every 5 (five) minutes x 3 doses as needed for chest pain. (Patient not taking: Reported on 06/14/2016) 12 g 2  . rosuvastatin (CRESTOR) 20 MG tablet Take 1 tablet (20 mg total) by mouth daily. 90 tablet 3   No current facility-administered medications for this visit.     SURGICAL HISTORY:  Past Surgical History:  Procedure Laterality Date  . CARDIAC CATHETERIZATION     normal EF, patent stents without obstructive disease (on Plavix X 1 month only)  . CAROTID STENT    . FRACTURE SURGERY     rt wrist  . LEFT HEART CATHETERIZATION WITH CORONARY ANGIOGRAM N/A 02/04/2012   Procedure: LEFT HEART CATHETERIZATION WITH CORONARY ANGIOGRAM;  Surgeon: Sinclair Grooms, MD;  Location: [facility] CATH LAB;  Service: Cardiovascular;  Laterality: N/A;  . ORIF ANKLE FRACTURE  07/04/2011   Procedure: OPEN REDUCTION INTERNAL FIXATION (ORIF) ANKLE FRACTURE;  Surgeon: Alta Corning, MD;  Location: [place];  Service: Orthopedics;  Laterality: Right;  . TOE SURGERY     rt great toe  . TONSILLECTOMY    . WRIST SURGERY     rt and lt cysts removed    REVIEW OF SYSTEMS:  Constitutional: positive for fatigue Eyes: negative Ears, nose, mouth, throat, and face: negative Respiratory: negative Cardiovascular: negative Gastrointestinal: negative Genitourinary:negative Integument/breast: negative Hematologic/lymphatic: negative Musculoskeletal:positive for arthralgias Neurological: negative Behavioral/Psych: negative Endocrine: negative Allergic/Immunologic: negative   PHYSICAL EXAMINATION: General appearance: alert, cooperative, fatigued and no distress Head: Normocephalic, without obvious abnormality, atraumatic Neck: no adenopathy, no JVD, supple, symmetrical, trachea midline and thyroid not enlarged, symmetric, no tenderness/mass/nodules Lymph nodes: Cervical, supraclavicular, and axillary nodes normal. Resp: clear to auscultation bilaterally Back:  symmetric, no curvature. ROM normal. No CVA tenderness. Cardio: regular rate and rhythm, S1, S2 normal, no murmur, click, rub or gallop GI: soft, non-tender; bowel sounds normal; no masses,  no organomegaly Extremities: extremities normal, atraumatic, no cyanosis or edema Neurologic: Alert and oriented X 3, normal strength and tone. Normal symmetric reflexes. Normal coordination and gait    ECOG PERFORMANCE STATUS: 0 - Asymptomatic  Blood pressure 111/74, pulse 76, temperature 98 F (36.7 C), temperature source Oral, resp. rate 18, height 5\' 8"  (1.727 m), weight 181 lb 3.2 oz (82.2 kg), SpO2 100 %.  LABORATORY DATA: Lab Results  Component Value Date   WBC 5.7 09/27/2016   HGB 13.3 09/27/2016   HCT 38.4 09/27/2016   MCV 96.5 09/27/2016   PLT 119 (L) 09/27/2016      Chemistry      Component Value Date/Time   NA 142 09/27/2016 0832   K 4.4 09/27/2016 0832   CL 105 12/13/2013 1045   CO2 23 09/27/2016 0832   BUN 13.2 09/27/2016 0832   CREATININE 1.2 09/27/2016 1601  Component Value Date/Time   CALCIUM 9.3 09/27/2016 0832   ALKPHOS 90 09/27/2016 0832   AST 39 (H) 09/27/2016 0832   ALT 33 09/27/2016 0832   BILITOT 0.61 09/27/2016 0832       RADIOGRAPHIC STUDIES: No results found.  ASSESSMENT AND PLAN:  This is a very pleasant 61 years old African-American male with chronic myeloid leukemia diagnosed in November 2014 and has been on treatment with Gleevec 400 mg by mouth daily since that time and has been tolerating the treatment well. Unfortunately his recent molecular study showed further increase in the BCR/ABL indicating molecular relapse but morphologically the patient continues to do well. The patient is now on Tasigna 400 mg by mouth twice a day. He has been on this approximately one week. Tolerating this medication well so far. Lab work reviewed and was stable except for mild thrombocytopenia. Repeat EKG was obtained today which was stable compared with  baseline. I will continue to monitor his blood count closely. He would come back for follow-up visit in 2 weeks for reevaluation and management of any adverse effect of his treatment. The patient agreed to the current plan. He was advised to call immediately if he has any concerning symptoms in the interval. The patient voices understanding of current disease status and treatment options and is in agreement with the current care plan.  Mikey Bussing, DNP, AGPCNP-BC, AOCNP

## 2016-10-18 ENCOUNTER — Other Ambulatory Visit (HOSPITAL_BASED_OUTPATIENT_CLINIC_OR_DEPARTMENT_OTHER): Payer: BLUE CROSS/BLUE SHIELD

## 2016-10-18 ENCOUNTER — Ambulatory Visit (HOSPITAL_BASED_OUTPATIENT_CLINIC_OR_DEPARTMENT_OTHER): Payer: BLUE CROSS/BLUE SHIELD | Admitting: Oncology

## 2016-10-18 ENCOUNTER — Encounter: Payer: Self-pay | Admitting: Oncology

## 2016-10-18 ENCOUNTER — Telehealth: Payer: Self-pay | Admitting: Oncology

## 2016-10-18 VITALS — BP 129/87 | HR 75 | Temp 98.3°F | Resp 18 | Wt 184.7 lb

## 2016-10-18 DIAGNOSIS — Z8546 Personal history of malignant neoplasm of prostate: Secondary | ICD-10-CM

## 2016-10-18 DIAGNOSIS — D696 Thrombocytopenia, unspecified: Secondary | ICD-10-CM

## 2016-10-18 DIAGNOSIS — Z5111 Encounter for antineoplastic chemotherapy: Secondary | ICD-10-CM

## 2016-10-18 DIAGNOSIS — C9210 Chronic myeloid leukemia, BCR/ABL-positive, not having achieved remission: Secondary | ICD-10-CM | POA: Diagnosis not present

## 2016-10-18 LAB — COMPREHENSIVE METABOLIC PANEL
ALBUMIN: 3.8 g/dL (ref 3.5–5.0)
ALK PHOS: 113 U/L (ref 40–150)
ALT: 28 U/L (ref 0–55)
ANION GAP: 7 meq/L (ref 3–11)
AST: 29 U/L (ref 5–34)
BILIRUBIN TOTAL: 0.74 mg/dL (ref 0.20–1.20)
BUN: 11.6 mg/dL (ref 7.0–26.0)
CALCIUM: 9.1 mg/dL (ref 8.4–10.4)
CO2: 25 mEq/L (ref 22–29)
Chloride: 109 mEq/L (ref 98–109)
Creatinine: 1.1 mg/dL (ref 0.7–1.3)
EGFR: 89 mL/min/{1.73_m2} — AB (ref >90)
GLUCOSE: 97 mg/dL (ref 70–140)
Potassium: 4 mEq/L (ref 3.5–5.1)
Sodium: 141 mEq/L (ref 136–145)
TOTAL PROTEIN: 6.8 g/dL (ref 6.4–8.3)

## 2016-10-18 LAB — CBC WITH DIFFERENTIAL/PLATELET
BASO%: 1.3 % (ref 0.0–2.0)
BASOS ABS: 0.1 10*3/uL (ref 0.0–0.1)
EOS ABS: 0.1 10*3/uL (ref 0.0–0.5)
EOS%: 2.3 % (ref 0.0–7.0)
HEMATOCRIT: 37.5 % — AB (ref 38.4–49.9)
HEMOGLOBIN: 12.8 g/dL — AB (ref 13.0–17.1)
LYMPH%: 22.9 % (ref 14.0–49.0)
MCH: 33.8 pg — AB (ref 27.2–33.4)
MCHC: 34.1 g/dL (ref 32.0–36.0)
MCV: 99.1 fL — AB (ref 79.3–98.0)
MONO#: 0.7 10*3/uL (ref 0.1–0.9)
MONO%: 12.2 % (ref 0.0–14.0)
NEUT%: 61.3 % (ref 39.0–75.0)
NEUTROS ABS: 3.8 10*3/uL (ref 1.5–6.5)
PLATELETS: 132 10*3/uL — AB (ref 140–400)
RBC: 3.79 10*6/uL — ABNORMAL LOW (ref 4.20–5.82)
RDW: 15.2 % — AB (ref 11.0–14.6)
WBC: 6.1 10*3/uL (ref 4.0–10.3)
lymph#: 1.4 10*3/uL (ref 0.9–3.3)

## 2016-10-18 LAB — LACTATE DEHYDROGENASE: LDH: 197 U/L (ref 125–245)

## 2016-10-18 NOTE — Progress Notes (Signed)
[facility] Follow up:    [name], [address] [place] [address]   DIAGNOSIS: Cancer Staging No matching staging information was found for the patient. 1) Chronic myeloid leukemia diagnosed in November of 2014. 2) History of prostate adenocarcinoma diagnosed in 2006  SUMMARY OF ONCOLOGIC HISTORY:  No history exists.   PRIOR THERAPY:  1) Status post prostatectomy. 2) Gleevec 400 mg by mouth daily, started 02/10/2013, discontinued on 09/13/2016 secondary to disease progression.  CURRENT THERAPY: Tasigna 400 mg by mouth twice a day. Started on 09/21/2016. Status post one month of treatment.  INTERVAL HISTORY: [name] 61 y.o. male returns for a routine follow-up visit. The patient is feeling fine with no specific complaints except for mild fatigue and intermittent rash. The fatigue has not worsened since starting treatment. Reports having red bumps that appear on his hands and then go away on their own. They are not itchy. Denies fevers and chills. Denies chest pain, shortness of breath, cough, hemoptysis. He denies nausea, vomiting, diarrhea. He has mild constipation and alters his diet to manage this. Appetite has remained good and he has no weight loss or night sweats. He continues to work full-time. The patient is here for evaluation of side effects related to Tasigna.    Patient Active Problem List   Diagnosis Date Noted  . Goals of care, counseling/discussion 09/13/2016  . Encounter for antineoplastic chemotherapy 09/13/2016  . History of prostate cancer 04/21/2013  . Chronic myeloid leukemia ([place]) 01/26/2013  . Leukocytosis, unspecified 01/06/2013  . Arthritis   . Bladder disorder   . Coronary artery disease   . Hyperlipidemia   . Diverticulosis   . ACS (acute coronary syndrome) ([place]) 02/04/2012  . Essential hypertension 02/04/2012    Class: Chronic  . Hyperlipidemia with target low density lipoprotein (LDL)  cholesterol less than 70 mg/dL 02/04/2012    Class: Chronic  . Diabetes mellitus ([place]) 02/04/2012    Class: Chronic  . Precordial chest pain 02/03/2012    is allergic to contrast media [iodinated diagnostic agents].  MEDICAL HISTORY: Past Medical History:  Diagnosis Date  . Arthritis   . Bladder disorder   . Cancer ([place])    hx prostate cancer and CML  . Chronic myelocytic leukemia ([place])   . Coronary artery disease    with prior MI 2002(BMS) and Cfx(2006) and RCA (2004) stents . EF 45-50%  . Coronary atherosclerosis of native coronary artery    stable and suspect a component of CAS  . Diverticulosis    seen on colonoscopy in 2008  . Goals of care, counseling/discussion 09/13/2016  . H/O colonoscopy 2014  . Hyperlipidemia   . Hypertension   . Myocardial infarction ([place]) 02,6/12  . Prostate cancer [facility])     SURGICAL HISTORY: Past Surgical History:  Procedure Laterality Date  . CARDIAC CATHETERIZATION     normal EF, patent stents without obstructive disease (on Plavix X 1 month only)  . CAROTID STENT    . FRACTURE SURGERY     rt wrist  . LEFT HEART CATHETERIZATION WITH CORONARY ANGIOGRAM N/A 02/04/2012   Procedure: LEFT HEART CATHETERIZATION WITH CORONARY ANGIOGRAM;  Surgeon: Sinclair Grooms, MD;  Location: [facility] CATH LAB;  Service: Cardiovascular;  Laterality: N/A;  . ORIF ANKLE FRACTURE  07/04/2011   Procedure: OPEN REDUCTION INTERNAL FIXATION (ORIF) ANKLE FRACTURE;  Surgeon: Alta Corning, MD;  Location: [place];  Service: Orthopedics;  Laterality: Right;  .  TOE SURGERY     rt great toe  . TONSILLECTOMY    . WRIST SURGERY     rt and lt cysts removed    SOCIAL HISTORY: Social History   Social History  . Marital status: Legally Separated    Spouse name: N/A  . Number of children: N/A  . Years of education: N/A   Occupational History  . Not on file.   Social History Main Topics  . Smoking status: Former Smoker    Types: Cigarettes    Quit  date: 06/06/2000  . Smokeless tobacco: Never Used     Comment: 2002  . Alcohol use No     Comment: rare  . Drug use: No  . Sexual activity: Not on file   Other Topics Concern  . Not on file   Social History Narrative  . No narrative on file    FAMILY HISTORY: Family History  Problem Relation Age of Onset  . Cancer Mother        Liver    Review of Systems  Constitutional: Negative.   HENT:  Negative.   Eyes: Negative.   Respiratory: Negative.   Cardiovascular: Negative.   Gastrointestinal: Negative.   Endocrine: Negative.   Genitourinary: Negative.    Musculoskeletal: Negative.   Skin: Positive for rash. Negative for itching.       Intermittent rash to hands that comes and goes. No rash present today.  Neurological: Negative.   Hematological: Negative.   Psychiatric/Behavioral: Negative.       PHYSICAL EXAMINATION  ECOG PERFORMANCE STATUS: 0 - Asymptomatic  Vitals:   10/18/16 0919  BP: 129/87  Pulse: 75  Resp: 18  Temp: 98.3 F (36.8 C)  SpO2: 100%    Physical Exam  Constitutional: He is oriented to person, place, and time and well-developed, well-nourished, and in no distress. No distress.  HENT:  Head: Normocephalic and atraumatic.  Mouth/Throat: Oropharynx is clear and moist. No oropharyngeal exudate.  Eyes: Conjunctivae are normal. Right eye exhibits no discharge. Left eye exhibits no discharge. No scleral icterus.  Neck: Normal range of motion. Neck supple. No tracheal deviation present.  Cardiovascular: Normal rate, regular rhythm, normal heart sounds and intact distal pulses.   Pulmonary/Chest: Effort normal and breath sounds normal. No respiratory distress. He has no wheezes. He has no rales.  Abdominal: Soft. Bowel sounds are normal. He exhibits no distension and no mass. There is no tenderness.  Musculoskeletal: Normal range of motion. He exhibits no edema or tenderness.  Lymphadenopathy:    He has no cervical adenopathy.  Neurological: He is  alert and oriented to person, place, and time. He exhibits normal muscle tone. Gait normal. Coordination normal.  Skin: Skin is warm and dry. No rash noted. He is not diaphoretic. No erythema. No pallor.  Psychiatric: Mood, memory, affect and judgment normal.  Vitals reviewed.   LABORATORY DATA:  CBC    Component Value Date/Time   WBC 6.1 10/18/2016 0904   WBC 6.2 12/13/2013 1045   RBC 3.79 (L) 10/18/2016 0904   RBC 3.46 (L) 12/13/2013 1045   HGB 12.8 (L) 10/18/2016 0904   HCT 37.5 (L) 10/18/2016 0904   PLT 132 (L) 10/18/2016 0904   MCV 99.1 (H) 10/18/2016 0904   MCH 33.8 (H) 10/18/2016 0904   MCH 33.5 12/13/2013 1045   MCHC 34.1 10/18/2016 0904   MCHC 35.0 12/13/2013 1045   RDW 15.2 (H) 10/18/2016 0904   LYMPHSABS 1.4 10/18/2016 0904   MONOABS 0.7 10/18/2016  0904   EOSABS 0.1 10/18/2016 0904   BASOSABS 0.1 10/18/2016 0904    CMP     Component Value Date/Time   NA 141 10/18/2016 0904   K 4.0 10/18/2016 0904   CL 105 12/13/2013 1045   CO2 25 10/18/2016 0904   GLUCOSE 97 10/18/2016 0904   BUN 11.6 10/18/2016 0904   CREATININE 1.1 10/18/2016 0904   CALCIUM 9.1 10/18/2016 0904   PROT 6.8 10/18/2016 0904   ALBUMIN 3.8 10/18/2016 0904   AST 29 10/18/2016 0904   ALT 28 10/18/2016 0904   ALKPHOS 113 10/18/2016 0904   BILITOT 0.74 10/18/2016 0904   GFRNONAA 69 (L) 12/13/2013 1045   GFRAA 80 (L) 12/13/2013 1045    RADIOGRAPHIC STUDIES:  No results found.  ASSESSMENT and THERAPY PLAN:   Chronic myeloid leukemia This is a very pleasant 61 year old African-American male with chronic myeloid leukemia diagnosed in November 2014 and has been on treatment with Gleevec 400 mg by mouth daily since that time and has been tolerating the treatment well. Unfortunately his recent molecular study showed further increase in the BCR/ABL indicating molecular relapse but morphologically the patient continues to do well. The patient is now on Tasigna 400 mg by mouth twice a day. he has  completed 1 month of treatment and is tolerating this medication well so far. Lab work reviewed and was stable except for mild thrombocytopenia.  Continue Tasigna at the current dose. Plan is for a follow-up visit in one month for reevaluation and management of any adverse effect of his treatment.   Orders Placed This Encounter  Procedures  . Comprehensive metabolic panel    Standing Status:   Standing    Number of Occurrences:   4    Standing Expiration Date:   10/18/2017  . CBC with Differential/Platelet    Standing Status:   Standing    Number of Occurrences:   4    Standing Expiration Date:   10/18/2017  . Lactate dehydrogenase    Standing Status:   Standing    Number of Occurrences:   4    Standing Expiration Date:   10/18/2017    All questions were answered. The patient knows to call the clinic with any problems, questions or concerns. We can certainly see the patient much sooner if necessary.  Mikey Bussing, NP 10/18/2016

## 2016-10-18 NOTE — Assessment & Plan Note (Signed)
This is a very pleasant 62 year old African-American male with chronic myeloid leukemia diagnosed in November 2014 and has been on treatment with Gleevec 400 mg by mouth daily since that time and has been tolerating the treatment well. Unfortunately his recent molecular study showed further increase in the BCR/ABL indicating molecular relapse but morphologically the patient continues to do well. The patient is now on Tasigna 400 mg by mouth twice a day. he has completed 1 month of treatment and is tolerating this medication well so far. Lab work reviewed and was stable except for mild thrombocytopenia.  Continue Tasigna at the current dose. Plan is for a follow-up visit in one month for reevaluation and management of any adverse effect of his treatment.

## 2016-10-18 NOTE — Telephone Encounter (Signed)
Scheduled appt per 8/16 los - Gave patient AVS and calender per los. Lab and f/u in one month.

## 2016-11-22 ENCOUNTER — Ambulatory Visit (HOSPITAL_BASED_OUTPATIENT_CLINIC_OR_DEPARTMENT_OTHER): Payer: BLUE CROSS/BLUE SHIELD | Admitting: Internal Medicine

## 2016-11-22 ENCOUNTER — Telehealth: Payer: Self-pay | Admitting: Internal Medicine

## 2016-11-22 ENCOUNTER — Other Ambulatory Visit (HOSPITAL_BASED_OUTPATIENT_CLINIC_OR_DEPARTMENT_OTHER): Payer: BLUE CROSS/BLUE SHIELD

## 2016-11-22 ENCOUNTER — Encounter: Payer: Self-pay | Admitting: Internal Medicine

## 2016-11-22 VITALS — BP 123/84 | HR 81 | Temp 97.3°F | Resp 18 | Wt 180.6 lb

## 2016-11-22 DIAGNOSIS — C9210 Chronic myeloid leukemia, BCR/ABL-positive, not having achieved remission: Secondary | ICD-10-CM

## 2016-11-22 DIAGNOSIS — Z5111 Encounter for antineoplastic chemotherapy: Secondary | ICD-10-CM

## 2016-11-22 LAB — CBC WITH DIFFERENTIAL/PLATELET
BASO%: 1.2 % (ref 0.0–2.0)
Basophils Absolute: 0.1 10*3/uL (ref 0.0–0.1)
EOS%: 2.2 % (ref 0.0–7.0)
Eosinophils Absolute: 0.2 10*3/uL (ref 0.0–0.5)
HEMATOCRIT: 40.6 % (ref 38.4–49.9)
HEMOGLOBIN: 13.8 g/dL (ref 13.0–17.1)
LYMPH#: 1.4 10*3/uL (ref 0.9–3.3)
LYMPH%: 17.9 % (ref 14.0–49.0)
MCH: 33.4 pg (ref 27.2–33.4)
MCHC: 33.9 g/dL (ref 32.0–36.0)
MCV: 98.5 fL — ABNORMAL HIGH (ref 79.3–98.0)
MONO#: 0.9 10*3/uL (ref 0.1–0.9)
MONO%: 12 % (ref 0.0–14.0)
NEUT%: 66.7 % (ref 39.0–75.0)
NEUTROS ABS: 5.1 10*3/uL (ref 1.5–6.5)
Platelets: 169 10*3/uL (ref 140–400)
RBC: 4.12 10*6/uL — ABNORMAL LOW (ref 4.20–5.82)
RDW: 13.8 % (ref 11.0–14.6)
WBC: 7.7 10*3/uL (ref 4.0–10.3)

## 2016-11-22 LAB — COMPREHENSIVE METABOLIC PANEL
ALT: 61 U/L — ABNORMAL HIGH (ref 0–55)
ANION GAP: 9 meq/L (ref 3–11)
AST: 51 U/L — ABNORMAL HIGH (ref 5–34)
Albumin: 3.9 g/dL (ref 3.5–5.0)
Alkaline Phosphatase: 172 U/L — ABNORMAL HIGH (ref 40–150)
BILIRUBIN TOTAL: 0.87 mg/dL (ref 0.20–1.20)
BUN: 12.4 mg/dL (ref 7.0–26.0)
CALCIUM: 9.5 mg/dL (ref 8.4–10.4)
CO2: 25 mEq/L (ref 22–29)
Chloride: 109 mEq/L (ref 98–109)
Creatinine: 1.2 mg/dL (ref 0.7–1.3)
EGFR: 77 mL/min/{1.73_m2} — AB (ref >90)
Glucose: 106 mg/dl (ref 70–140)
Potassium: 4.4 mEq/L (ref 3.5–5.1)
Sodium: 143 mEq/L (ref 136–145)
TOTAL PROTEIN: 7.2 g/dL (ref 6.4–8.3)

## 2016-11-22 LAB — LACTATE DEHYDROGENASE: LDH: 233 U/L (ref 125–245)

## 2016-11-22 NOTE — Telephone Encounter (Signed)
Gave avs and calendar for october °

## 2016-11-22 NOTE — Progress Notes (Signed)
[place] [phone]   [phone]  OFFICE PROGRESS NOTE  [name], [address] [place] [address]  DIAGNOSIS:  1) Chronic myeloid leukemia diagnosed in November of 2014. 2) History of prostate adenocarcinoma diagnosed in 2006  PRIOR THERAPY:  1) Status post prostatectomy. 2) Gleevec 400 mg by mouth daily, started 02/10/2013, discontinued on 09/13/2016 secondary to disease progression.  CURRENT THERAPY: Tasigna 400 mg by mouth twice a day. First dose started 09/21/2016.  INTERVAL HISTORY: [name] 61 y.o. male returns to the clinic today for follow-up visit. The patient is feeling fine today with no specific complaints except for intermittent skin rash and mild itching on the upper and lower extremities. The patient denied having any chest pain, shortness of breath, cough or hemoptysis. He denied having any fever or chills. He has no nausea, vomiting, diarrhea or constipation. He continues to tolerate his treatment with Tasigna fairly well. He is here today for evaluation and repeat blood work.   MEDICAL HISTORY: Past Medical History:  Diagnosis Date  . Arthritis   . Bladder disorder   . Cancer ([place])    hx prostate cancer and CML  . Chronic myelocytic leukemia ([place])   . Coronary artery disease    with prior MI 2002(BMS) and Cfx(2006) and RCA (2004) stents . EF 45-50%  . Coronary atherosclerosis of native coronary artery    stable and suspect a component of CAS  . Diverticulosis    seen on colonoscopy in 2008  . Goals of care, counseling/discussion 09/13/2016  . H/O colonoscopy 2014  . Hyperlipidemia   . Hypertension   . Myocardial infarction ([place]) 02,6/12  . Prostate cancer ([place])     ALLERGIES:  is allergic to contrast media [iodinated diagnostic agents].  MEDICATIONS:  Current Outpatient Prescriptions  Medication Sig Dispense Refill  . acetaminophen-codeine 120-12 MG/5ML suspension Take  every 8 hours for cough. (Patient not taking: Reported on 06/14/2016) 120 mL 0  . Ascorbic Acid (VITAMIN C) 1000 MG tablet Take 1,000 mg by mouth daily.    Marland Kitchen aspirin 81 MG chewable tablet Chew 162 mg by mouth daily.     . benzonatate (TESSALON) 200 MG capsule TAKE 1 CAPSULE BY MOUTH TWICE DAILY AS NEEDED FOR COUGH (Patient not taking: Reported on 09/27/2016) 20 capsule 0  . cholecalciferol (VITAMIN D) 1000 UNITS tablet Take 5,000 Units by mouth daily.     . Coenzyme Q10 (COQ10) 100 MG CAPS Take 100 mg by mouth daily.    Marland Kitchen darifenacin (ENABLEX) 15 MG 24 hr tablet Take 15 mg by mouth daily.     Marland Kitchen dexlansoprazole (DEXILANT) 60 MG capsule Take 60 mg by mouth daily.    . fluticasone (FLONASE) 50 MCG/ACT nasal spray Place 2 sprays into both nostrils daily as needed for allergies or rhinitis (congestion).    . hydrOXYzine (ATARAX/VISTARIL) 25 MG tablet Take 50 mg by mouth daily.    . isosorbide mononitrate (IMDUR) 60 MG 24 hr tablet TAKE ONE TABLET BY MOUTH ONCE DAILY 90 tablet 3  . levocetirizine (XYZAL) 5 MG tablet Take 1 tablet (5 mg total) by mouth every evening. (Patient not taking: Reported on 06/14/2016) 30 tablet 1  . magnesium oxide (MAG-OX) 400 MG tablet Take 400 mg by mouth daily as needed (cramps).    . meloxicam (MOBIC) 15 MG tablet Take 15 mg by mouth daily.  1  . metoprolol succinate (TOPROL-XL) 100 MG 24 hr tablet TAKE  ONE TABLET BY MOUTH WITH MEALS OR  IMMEDIATELY  FOLLOWING 90 tablet 3  . MYRBETRIQ 50 MG TB24 tablet daily.  1  . nilotinib (TASIGNA) 200 MG capsule Take 2 capsules (400 mg total) by mouth every 12 (twelve) hours. Give on an empty stomach 1 hour before or 2 hours after meals. 120 capsule 2  . nilotinib (TASIGNA) 200 MG capsule Take 2 capsules (400 mg total) by mouth every 12 (twelve) hours. Give on an empty stomach 1 hour before or 2 hours after meals. 364 capsule 0  . nitroGLYCERIN (NITROLINGUAL) 0.4 MG/SPRAY spray Place 1 spray under the tongue every 5 (five) minutes x 3  doses as needed for chest pain. (Patient not taking: Reported on 06/14/2016) 12 g 2  . rosuvastatin (CRESTOR) 20 MG tablet Take 1 tablet (20 mg total) by mouth daily. 90 tablet 3  . valsartan (DIOVAN) 40 MG tablet TAKE ONE TABLET BY MOUTH ONCE DAILY 90 tablet 3   No current facility-administered medications for this visit.     SURGICAL HISTORY:  Past Surgical History:  Procedure Laterality Date  . CARDIAC CATHETERIZATION     normal EF, patent stents without obstructive disease (on Plavix X 1 month only)  . CAROTID STENT    . FRACTURE SURGERY     rt wrist  . LEFT HEART CATHETERIZATION WITH CORONARY ANGIOGRAM N/A 02/04/2012   Procedure: LEFT HEART CATHETERIZATION WITH CORONARY ANGIOGRAM;  Surgeon: Sinclair Grooms, MD;  Location: [facility] CATH LAB;  Service: Cardiovascular;  Laterality: N/A;  . ORIF ANKLE FRACTURE  07/04/2011   Procedure: OPEN REDUCTION INTERNAL FIXATION (ORIF) ANKLE FRACTURE;  Surgeon: Alta Corning, MD;  Location: [facility];  Service: Orthopedics;  Laterality: Right;  . TOE SURGERY     rt great toe  . TONSILLECTOMY    . WRIST SURGERY     rt and lt cysts removed    REVIEW OF SYSTEMS:  A comprehensive review of systems was negative except for: Integument/breast: positive for pruritus and rash   PHYSICAL EXAMINATION: General appearance: alert, cooperative and no distress Head: Normocephalic, without obvious abnormality, atraumatic Neck: no adenopathy, no JVD, supple, symmetrical, trachea midline and thyroid not enlarged, symmetric, no tenderness/mass/nodules Lymph nodes: Cervical, supraclavicular, and axillary nodes normal. Resp: clear to auscultation bilaterally Back: symmetric, no curvature. ROM normal. No CVA tenderness. Cardio: regular rate and rhythm, S1, S2 normal, no murmur, click, rub or gallop GI: soft, non-tender; bowel sounds normal; no masses,  no organomegaly Extremities: extremities normal, atraumatic, no cyanosis or edema    ECOG PERFORMANCE  STATUS: 0 - Asymptomatic  Blood pressure 123/84, pulse 81, temperature (!) 97.3 F (36.3 C), temperature source Oral, resp. rate 18, weight 180 lb 9.6 oz (81.9 kg), SpO2 100 %.  LABORATORY DATA: Lab Results  Component Value Date   WBC 6.1 10/18/2016   HGB 12.8 (L) 10/18/2016   HCT 37.5 (L) 10/18/2016   MCV 99.1 (H) 10/18/2016   PLT 132 (L) 10/18/2016      Chemistry      Component Value Date/Time   NA 141 10/18/2016 0904   K 4.0 10/18/2016 0904   CL 105 12/13/2013 1045   CO2 25 10/18/2016 0904   BUN 11.6 10/18/2016 0904   CREATININE 1.1 10/18/2016 0904      Component Value Date/Time   CALCIUM 9.1 10/18/2016 0904   ALKPHOS 113 10/18/2016 0904   AST 29 10/18/2016 0904   ALT 28 10/18/2016 0904   BILITOT 0.74 10/18/2016 0904  RADIOGRAPHIC STUDIES: No results found.  ASSESSMENT AND PLAN:  This is a very pleasant 61 years old African-American male with chronic myeloid leukemia diagnosed in November 2014 and has been on treatment with Gleevec 400 mg by mouth daily since that time and has been tolerating the treatment well. The patient had evidence for molecular relapse 2 months ago and he was started on treatment with Tasigna 400 mg by mouth twice a day status post 2 months and has been tolerating this treatment fairly well except for mild skin rash and itching. CBC today is unremarkable. I recommended for the patient to continue his treatment with Tasigna with the same dose. I will see him back for follow-up visit in one month's for reevaluation with repeat blood work. The patient was advised to call immediately if he has any concerning symptoms in the interval. The patient voices understanding of current disease status and treatment options and is in agreement with the current care plan. All questions were answered. The patient knows to call the clinic with any problems, questions or concerns. We can certainly see the patient much sooner if necessary. I spent 10 minutes  counseling the patient face to face. The total time spent in the appointment was 15 minutes. Disclaimer: This note was dictated with voice recognition software. Similar sounding words can inadvertently be transcribed and may not be corrected upon review.

## 2016-11-27 ENCOUNTER — Telehealth: Payer: Self-pay | Admitting: Pharmacist

## 2016-11-27 MED ORDER — IRBESARTAN 75 MG PO TABS: 37.5000 mg | ORAL_TABLET | Freq: Every day | ORAL | 5 refills | Status: DC

## 2016-11-27 NOTE — Telephone Encounter (Signed)
Rx for irbesartan sent to replace valsartan

## 2016-11-29 ENCOUNTER — Other Ambulatory Visit: Payer: Self-pay

## 2016-11-29 MED ORDER — IRBESARTAN 75 MG PO TABS: 37.5000 mg | ORAL_TABLET | Freq: Every day | ORAL | 5 refills | Status: DC

## 2016-12-14 IMAGING — US US EXTREM LOW VENOUS*R*
1 series · 14 of 24 positions shown · non-contrast
Comparison: None.

CLINICAL DATA: Right leg pain for 1 day

EXAM:
RIGHT LOWER EXTREMITY VENOUS DUPLEX ULTRASOUND
TECHNIQUE: Doppler venous assessment of the right lower extremity deep venous
system was performed, including characterization of spectral flow,
compressibility, and phasicity.

[Series 1: us extrem low venous*right* · 0.06mm/px · 14 of 28 slices shown]
[im 1/28]
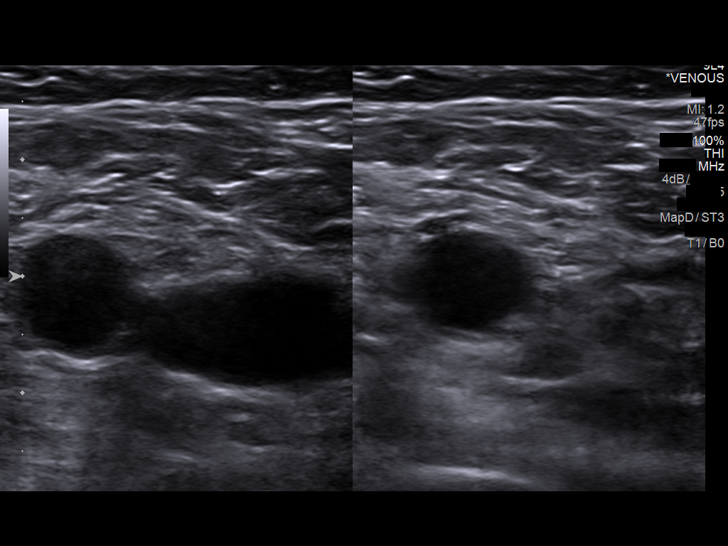
[im 3/28]
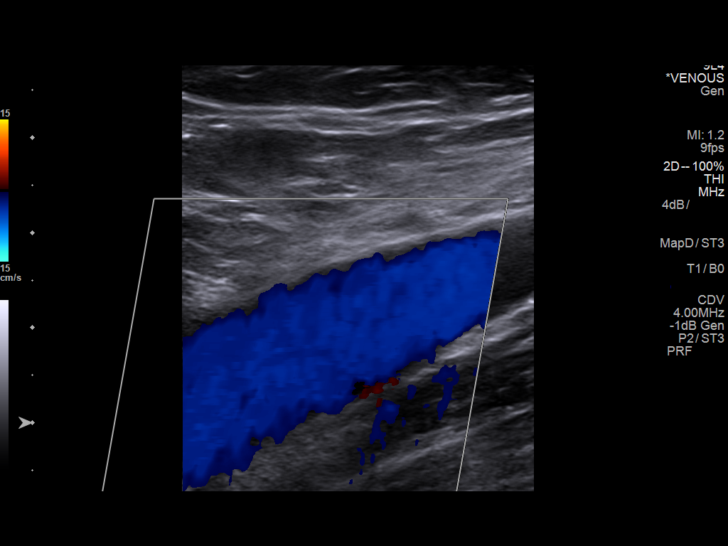
[im 5/28]
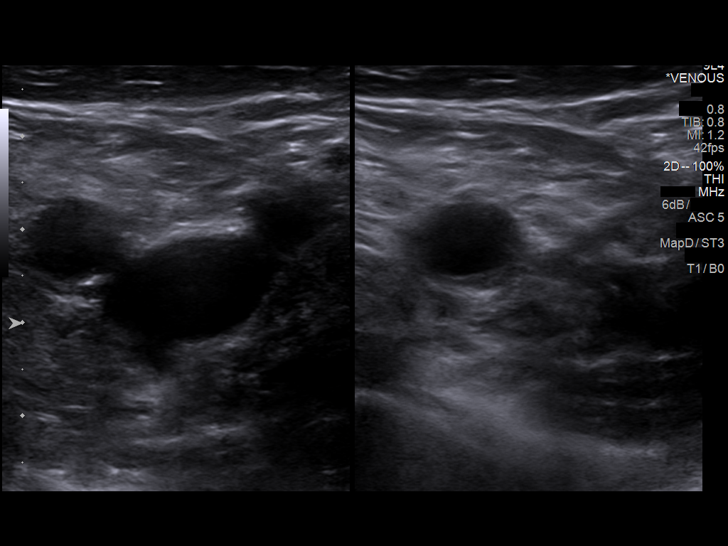
[im 8/28]
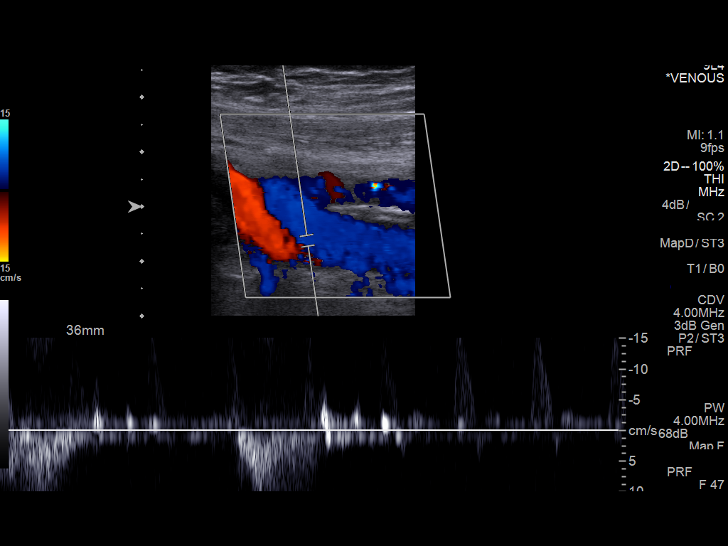
[im 9/28]
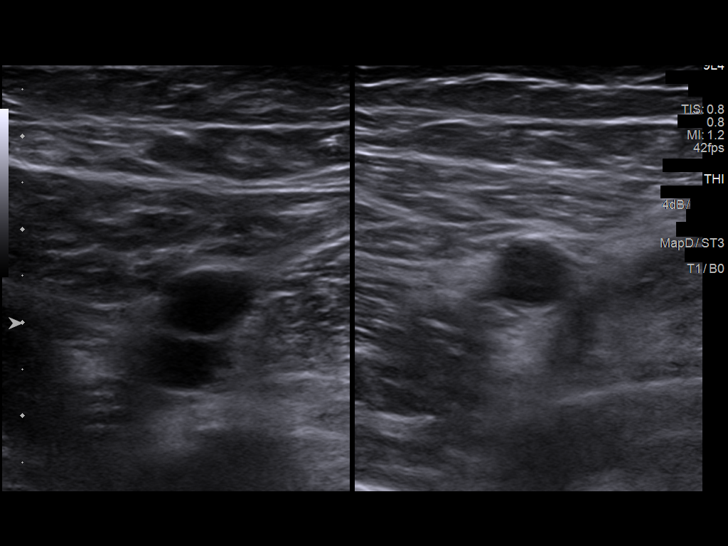
[im 11/28]
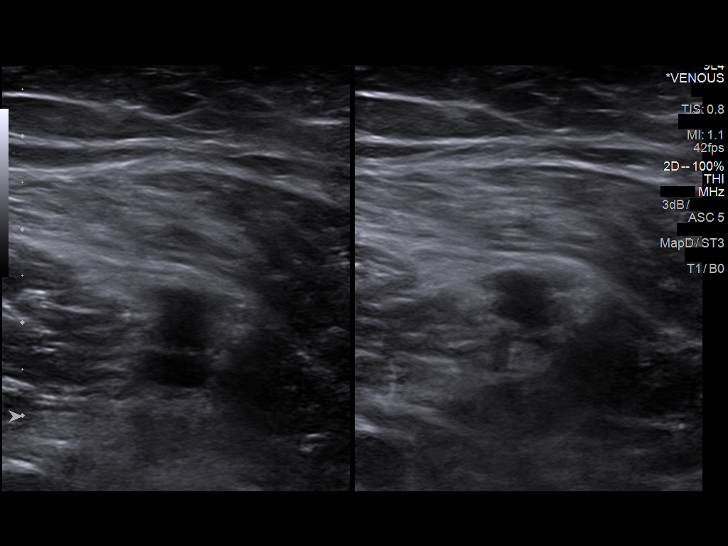
[im 13/28]
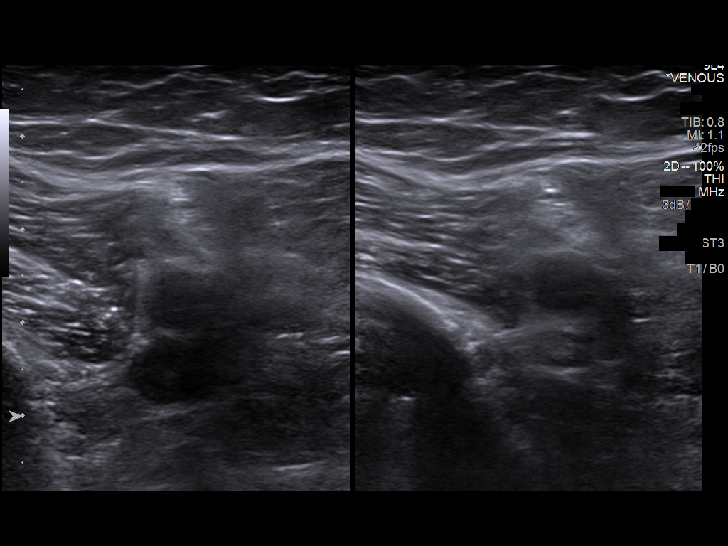
[im 15/28]
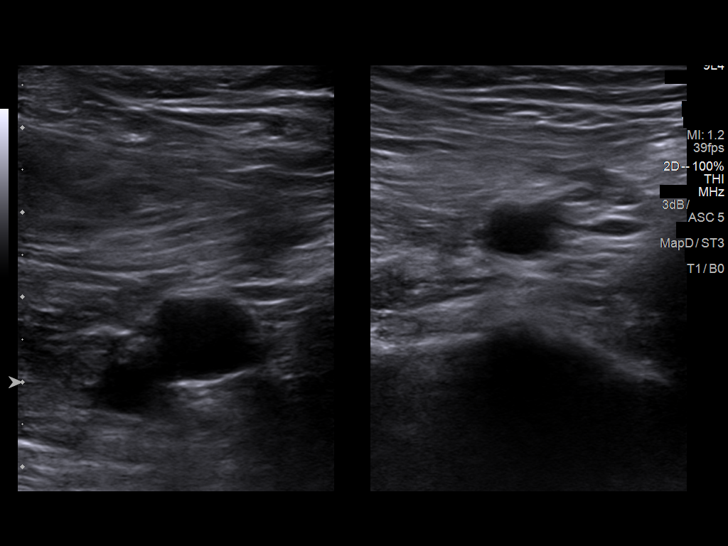
[im 17/28]
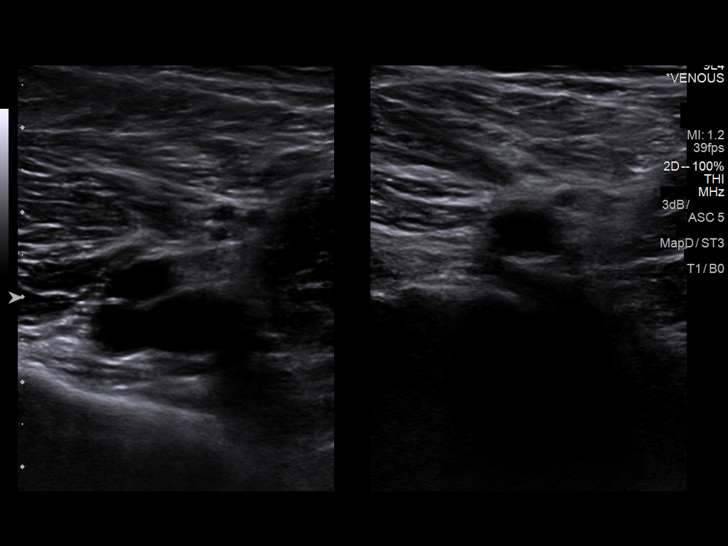
[im 19/28]
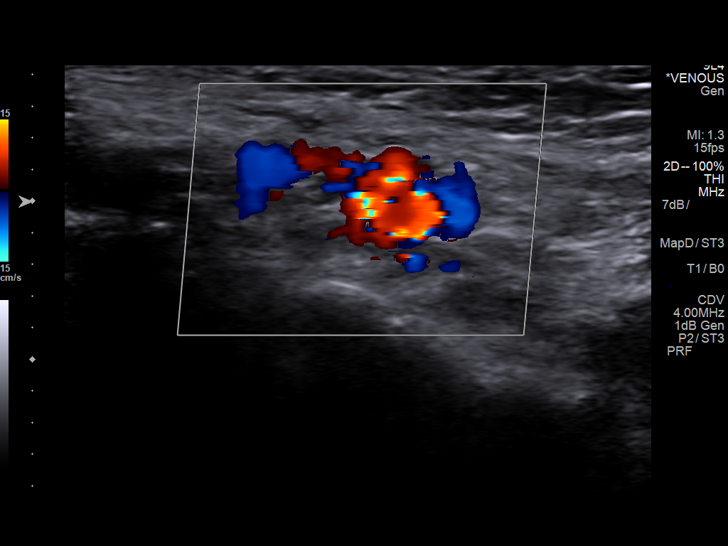
[im 22/28]
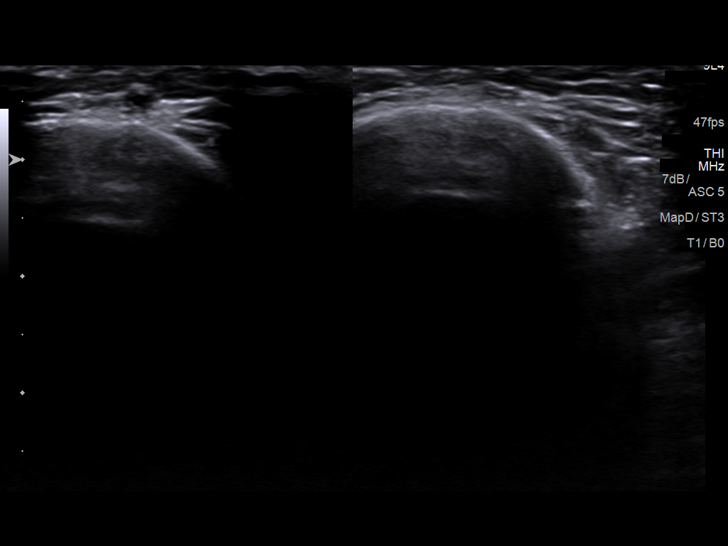
[im 23/28]
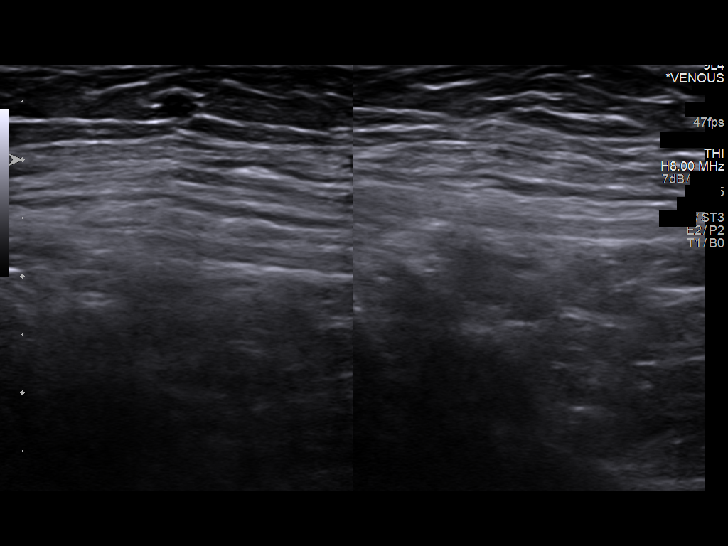
[im 25/28]
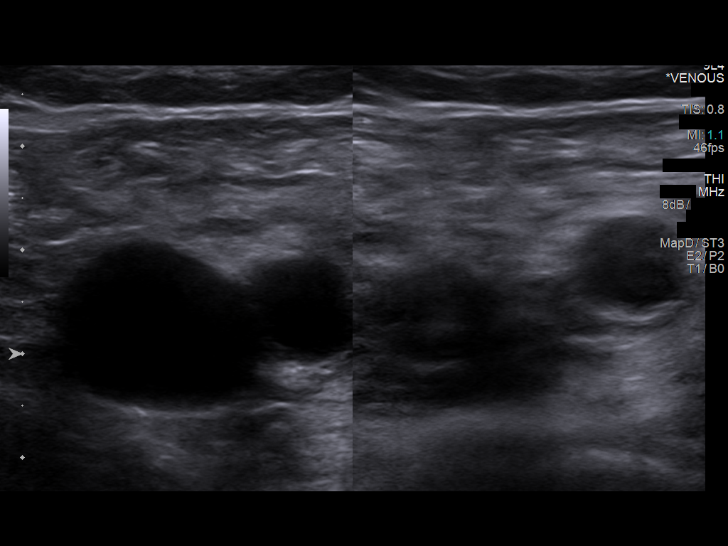
[im 28/28]
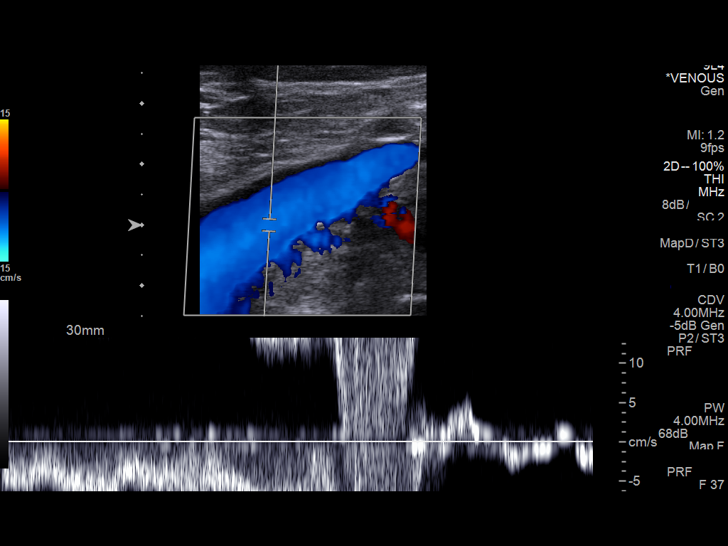

[14 of 24 positions shown; findings below may reference images not displayed]

FINDINGS: Hip there is complete compressibility of the right common femoral,
femoral, and popliteal veins. Doppler analysis demonstrates
respiratory phasicity. No superficial vein or calf vein thrombosis.
IMPRESSION: No evidence of right lower extremity DVT.

## 2016-12-16 ENCOUNTER — Other Ambulatory Visit: Payer: Self-pay | Admitting: Internal Medicine

## 2016-12-16 DIAGNOSIS — C9210 Chronic myeloid leukemia, BCR/ABL-positive, not having achieved remission: Secondary | ICD-10-CM

## 2016-12-16 DIAGNOSIS — I10 Essential (primary) hypertension: Secondary | ICD-10-CM

## 2016-12-20 ENCOUNTER — Other Ambulatory Visit: Payer: BLUE CROSS/BLUE SHIELD

## 2016-12-20 ENCOUNTER — Ambulatory Visit: Payer: BLUE CROSS/BLUE SHIELD | Admitting: Oncology

## 2016-12-25 ENCOUNTER — Other Ambulatory Visit: Payer: Self-pay | Admitting: Medical Oncology

## 2016-12-25 DIAGNOSIS — C9210 Chronic myeloid leukemia, BCR/ABL-positive, not having achieved remission: Secondary | ICD-10-CM

## 2016-12-25 DIAGNOSIS — I10 Essential (primary) hypertension: Secondary | ICD-10-CM

## 2016-12-25 MED ORDER — NILOTINIB HCL 200 MG PO CAPS: ORAL_CAPSULE | ORAL | 0 refills | Status: DC

## 2016-12-25 NOTE — Plan of Care (Signed)
New pharmacy for tasigna- [facility]. Sent tasigna rx to this pharmcy.

## 2016-12-28 ENCOUNTER — Other Ambulatory Visit (HOSPITAL_BASED_OUTPATIENT_CLINIC_OR_DEPARTMENT_OTHER): Payer: BLUE CROSS/BLUE SHIELD

## 2016-12-28 ENCOUNTER — Ambulatory Visit (HOSPITAL_BASED_OUTPATIENT_CLINIC_OR_DEPARTMENT_OTHER): Payer: BLUE CROSS/BLUE SHIELD | Admitting: Oncology

## 2016-12-28 ENCOUNTER — Other Ambulatory Visit: Payer: Self-pay | Admitting: Pharmacist

## 2016-12-28 ENCOUNTER — Telehealth: Payer: Self-pay

## 2016-12-28 ENCOUNTER — Encounter: Payer: Self-pay | Admitting: Oncology

## 2016-12-28 VITALS — BP 118/79 | HR 79 | Temp 97.6°F | Resp 20 | Ht 68.0 in | Wt 180.9 lb

## 2016-12-28 DIAGNOSIS — Z5111 Encounter for antineoplastic chemotherapy: Secondary | ICD-10-CM

## 2016-12-28 DIAGNOSIS — C9210 Chronic myeloid leukemia, BCR/ABL-positive, not having achieved remission: Secondary | ICD-10-CM

## 2016-12-28 DIAGNOSIS — Z8546 Personal history of malignant neoplasm of prostate: Secondary | ICD-10-CM | POA: Diagnosis not present

## 2016-12-28 LAB — COMPREHENSIVE METABOLIC PANEL
ALT: 43 U/L (ref 0–55)
ANION GAP: 10 meq/L (ref 3–11)
AST: 43 U/L — ABNORMAL HIGH (ref 5–34)
Albumin: 4 g/dL (ref 3.5–5.0)
Alkaline Phosphatase: 158 U/L — ABNORMAL HIGH (ref 40–150)
BILIRUBIN TOTAL: 0.6 mg/dL (ref 0.20–1.20)
BUN: 15.9 mg/dL (ref 7.0–26.0)
CO2: 24 meq/L (ref 22–29)
Calcium: 9.1 mg/dL (ref 8.4–10.4)
Chloride: 108 mEq/L (ref 98–109)
Creatinine: 1.3 mg/dL (ref 0.7–1.3)
GLUCOSE: 94 mg/dL (ref 70–140)
POTASSIUM: 4.1 meq/L (ref 3.5–5.1)
Sodium: 143 mEq/L (ref 136–145)
Total Protein: 7.3 g/dL (ref 6.4–8.3)

## 2016-12-28 LAB — CBC WITH DIFFERENTIAL/PLATELET
BASO%: 1 % (ref 0.0–2.0)
Basophils Absolute: 0.1 10*3/uL (ref 0.0–0.1)
EOS%: 2.1 % (ref 0.0–7.0)
Eosinophils Absolute: 0.1 10*3/uL (ref 0.0–0.5)
HEMATOCRIT: 40.8 % (ref 38.4–49.9)
HGB: 13.8 g/dL (ref 13.0–17.1)
LYMPH#: 2.3 10*3/uL (ref 0.9–3.3)
LYMPH%: 33.1 % (ref 14.0–49.0)
MCH: 32.7 pg (ref 27.2–33.4)
MCHC: 33.9 g/dL (ref 32.0–36.0)
MCV: 96.5 fL (ref 79.3–98.0)
MONO#: 0.7 10*3/uL (ref 0.1–0.9)
MONO%: 10.3 % (ref 0.0–14.0)
NEUT%: 53.5 % (ref 39.0–75.0)
NEUTROS ABS: 3.8 10*3/uL (ref 1.5–6.5)
PLATELETS: 216 10*3/uL (ref 140–400)
RBC: 4.23 10*6/uL (ref 4.20–5.82)
RDW: 13.4 % (ref 11.0–14.6)
WBC: 7 10*3/uL (ref 4.0–10.3)

## 2016-12-28 LAB — LACTATE DEHYDROGENASE: LDH: 212 U/L (ref 125–245)

## 2016-12-28 NOTE — Progress Notes (Signed)
[facility] Follow up:    [name], [address] [place] [address]   DIAGNOSIS:  1) Chronic myeloid leukemia diagnosed in November of 2014. 2) History of prostate adenocarcinoma diagnosed in 2006  SUMMARY OF ONCOLOGIC HISTORY:  No history exists.   PRIOR THERAPY:  1) Status post prostatectomy. 2) Gleevec 400 mg by mouth daily, started 02/10/2013, discontinued on 09/13/2016 secondary to disease progression.  CURRENT THERAPY: Tasigna 400 mg by mouth twice a day. First dose started 09/21/2016.  INTERVAL HISTORY: [name] 61 y.o. male returns for routine follow-up visit by himself. The patient is feeling fine today with no specific complaints except for intermittent skin rash and mild itching on the upper and lower extremities. The patient denied having any chest pain, shortness of breath, cough or hemoptysis. He denied having any fever or chills. He has no nausea, vomiting, diarrhea or constipation. He continues to tolerate his treatment with Tasigna fairly well. He is here today for evaluation and repeat blood work.   Patient Active Problem List   Diagnosis Date Noted  . Goals of care, counseling/discussion 09/13/2016  . Encounter for antineoplastic chemotherapy 09/13/2016  . History of prostate cancer 04/21/2013  . Chronic myeloid leukemia ([place]) 01/26/2013  . Leukocytosis, unspecified 01/06/2013  . Arthritis   . Bladder disorder   . Coronary artery disease   . Hyperlipidemia   . Diverticulosis   . ACS (acute coronary syndrome) ([place]) 02/04/2012  . Essential hypertension 02/04/2012    Class: Chronic  . Hyperlipidemia with target low density lipoprotein (LDL) cholesterol less than 70 mg/dL 02/04/2012    Class: Chronic  . Diabetes mellitus ([place]) 02/04/2012    Class: Chronic  . Precordial chest pain 02/03/2012    is allergic to contrast media [iodinated diagnostic agents].  MEDICAL HISTORY: Past Medical History:   Diagnosis Date  . Arthritis   . Bladder disorder   . Cancer ([place])    hx prostate cancer and CML  . Chronic myelocytic leukemia ([place])   . Coronary artery disease    with prior MI 2002(BMS) and Cfx(2006) and RCA (2004) stents . EF 45-50%  . Coronary atherosclerosis of native coronary artery    stable and suspect a component of CAS  . Diverticulosis    seen on colonoscopy in 2008  . Goals of care, counseling/discussion 09/13/2016  . H/O colonoscopy 2014  . Hyperlipidemia   . Hypertension   . Myocardial infarction ([place]) 02,6/12  . Prostate cancer [facility])     SURGICAL HISTORY: Past Surgical History:  Procedure Laterality Date  . CARDIAC CATHETERIZATION     normal EF, patent stents without obstructive disease (on Plavix X 1 month only)  . CAROTID STENT    . FRACTURE SURGERY     rt wrist  . LEFT HEART CATHETERIZATION WITH CORONARY ANGIOGRAM N/A 02/04/2012   Procedure: LEFT HEART CATHETERIZATION WITH CORONARY ANGIOGRAM;  Surgeon: Sinclair Grooms, MD;  Location: [facility] CATH LAB;  Service: Cardiovascular;  Laterality: N/A;  . ORIF ANKLE FRACTURE  07/04/2011   Procedure: OPEN REDUCTION INTERNAL FIXATION (ORIF) ANKLE FRACTURE;  Surgeon: Alta Corning, MD;  Location: [facility];  Service: Orthopedics;  Laterality: Right;  . TOE SURGERY     rt great toe  . TONSILLECTOMY    . WRIST SURGERY     rt and lt cysts removed    SOCIAL HISTORY: Social History   Social History  . Marital status: Legally Separated  Spouse name: N/A  . Number of children: N/A  . Years of education: N/A   Occupational History  . Not on file.   Social History Main Topics  . Smoking status: Former Smoker    Types: Cigarettes    Quit date: 06/06/2000  . Smokeless tobacco: Never Used     Comment: 2002  . Alcohol use No     Comment: rare  . Drug use: No  . Sexual activity: Not on file   Other Topics Concern  . Not on file   Social History Narrative  . No narrative on file    FAMILY  HISTORY: Family History  Problem Relation Age of Onset  . Cancer Mother        Liver    Review of Systems  Constitutional: Negative.   HENT:  Negative.   Eyes: Negative.   Respiratory: Negative.   Cardiovascular: Negative.   Gastrointestinal: Negative.   Genitourinary: Negative.    Musculoskeletal: Negative.   Skin: Positive for rash.  Neurological: Negative.   Hematological: Negative.   Psychiatric/Behavioral: Negative.       PHYSICAL EXAMINATION  ECOG PERFORMANCE STATUS: 0 - Asymptomatic  Vitals:   12/28/16 1456  BP: 118/79  Pulse: 79  Resp: 20  Temp: 97.6 F (36.4 C)  SpO2: 100%    Physical Exam  Constitutional: He is oriented to person, place, and time and well-developed, well-nourished, and in no distress. No distress.  HENT:  Head: Normocephalic and atraumatic.  Mouth/Throat: Oropharynx is clear and moist. No oropharyngeal exudate.  Eyes: Conjunctivae are normal. Right eye exhibits no discharge. Left eye exhibits no discharge. No scleral icterus.  Neck: Normal range of motion. Neck supple.  Cardiovascular: Normal rate, regular rhythm, normal heart sounds and intact distal pulses.   Pulmonary/Chest: Effort normal and breath sounds normal. No respiratory distress. He has no wheezes. He has no rales.  Abdominal: Soft. Bowel sounds are normal. He exhibits no distension. There is no tenderness.  Musculoskeletal: Normal range of motion. He exhibits no edema.  Lymphadenopathy:    He has no cervical adenopathy.  Neurological: He is alert and oriented to person, place, and time. He exhibits normal muscle tone. Gait normal. Coordination normal.  Skin: Skin is warm and dry. No rash noted. He is not diaphoretic. No erythema. No pallor.  Psychiatric: Mood, memory, affect and judgment normal.  Vitals reviewed.   LABORATORY DATA:  CBC    Component Value Date/Time   WBC 7.0 12/28/2016 1444   WBC 6.2 12/13/2013 1045   RBC 4.23 12/28/2016 1444   RBC 3.46 (L)  12/13/2013 1045   HGB 13.8 12/28/2016 1444   HCT 40.8 12/28/2016 1444   PLT 216 12/28/2016 1444   MCV 96.5 12/28/2016 1444   MCH 32.7 12/28/2016 1444   MCH 33.5 12/13/2013 1045   MCHC 33.9 12/28/2016 1444   MCHC 35.0 12/13/2013 1045   RDW 13.4 12/28/2016 1444   LYMPHSABS 2.3 12/28/2016 1444   MONOABS 0.7 12/28/2016 1444   EOSABS 0.1 12/28/2016 1444   BASOSABS 0.1 12/28/2016 1444    CMP     Component Value Date/Time   NA 143 12/28/2016 1444   K 4.1 12/28/2016 1444   CL 105 12/13/2013 1045   CO2 24 12/28/2016 1444   GLUCOSE 94 12/28/2016 1444   BUN 15.9 12/28/2016 1444   CREATININE 1.3 12/28/2016 1444   CALCIUM 9.1 12/28/2016 1444   PROT 7.3 12/28/2016 1444   ALBUMIN 4.0 12/28/2016 1444   AST 43 (  H) 12/28/2016 1444   ALT 43 12/28/2016 1444   ALKPHOS 158 (H) 12/28/2016 1444   BILITOT 0.60 12/28/2016 1444   GFRNONAA 69 (L) 12/13/2013 1045   GFRAA 80 (L) 12/13/2013 1045     RADIOGRAPHIC STUDIES:  No results found.   ASSESSMENT and THERAPY PLAN:   Chronic myeloid leukemia This is a very pleasant 61 year old African-American male with chronic myeloid leukemia diagnosed in November 2014 and has been on treatment with Gleevec 400 mg by mouth daily since that time and has been tolerating the treatment well. The patient had evidence for molecular relapse 2 months ago and he was started on treatment with Tasigna 400 mg by mouth twice a day status post 3 months and has been tolerating this treatment fairly well except for mild skin rash and itching. CBC today is unremarkable. I recommended for the patient to continue his treatment with Tasigna with the same dose. I will see him back for follow-up visit in one month's for reevaluation with repeat blood work.  The patient was advised to call immediately if he has any concerning symptoms in the interval. The patient voices understanding of current disease status and treatment options and is in agreement with the current care  plan. All questions were answered. The patient knows to call the clinic with any problems, questions or concerns. We can certainly see the patient much sooner if necessary.   No orders of the defined types were placed in this encounter.   All questions were answered. The patient knows to call the clinic with any problems, questions or concerns. We can certainly see the patient much sooner if necessary.  Mikey Bussing, NP 12/28/2016

## 2016-12-28 NOTE — Telephone Encounter (Signed)
Printed avs and calender for upcoming appointment. Per 10/26 los 

## 2016-12-28 NOTE — Assessment & Plan Note (Signed)
This is a very pleasant 61 year old African-American male with chronic myeloid leukemia diagnosed in November 2014 and has been on treatment with Gleevec 400 mg by mouth daily since that time and has been tolerating the treatment well. The patient had evidence for molecular relapse 2 months ago and he was started on treatment with Tasigna 400 mg by mouth twice a day status post 3 months and has been tolerating this treatment fairly well except for mild skin rash and itching. CBC today is unremarkable. I recommended for the patient to continue his treatment with Tasigna with the same dose. I will see him back for follow-up visit in one month's for reevaluation with repeat blood work.  The patient was advised to call immediately if he has any concerning symptoms in the interval. The patient voices understanding of current disease status and treatment options and is in agreement with the current care plan. All questions were answered. The patient knows to call the clinic with any problems, questions or concerns. We can certainly see the patient much sooner if necessary.

## 2017-01-11 ENCOUNTER — Other Ambulatory Visit: Payer: Self-pay | Admitting: Internal Medicine

## 2017-01-11 DIAGNOSIS — I10 Essential (primary) hypertension: Secondary | ICD-10-CM

## 2017-01-11 DIAGNOSIS — C9210 Chronic myeloid leukemia, BCR/ABL-positive, not having achieved remission: Secondary | ICD-10-CM

## 2017-01-23 ENCOUNTER — Ambulatory Visit: Payer: BLUE CROSS/BLUE SHIELD | Admitting: Urgent Care

## 2017-01-29 ENCOUNTER — Encounter: Payer: Self-pay | Admitting: Internal Medicine

## 2017-01-29 ENCOUNTER — Other Ambulatory Visit (HOSPITAL_BASED_OUTPATIENT_CLINIC_OR_DEPARTMENT_OTHER): Payer: BLUE CROSS/BLUE SHIELD

## 2017-01-29 ENCOUNTER — Ambulatory Visit (HOSPITAL_BASED_OUTPATIENT_CLINIC_OR_DEPARTMENT_OTHER): Payer: BLUE CROSS/BLUE SHIELD | Admitting: Internal Medicine

## 2017-01-29 ENCOUNTER — Telehealth: Payer: Self-pay | Admitting: Internal Medicine

## 2017-01-29 VITALS — BP 125/96 | HR 77 | Temp 98.4°F | Resp 20 | Ht 68.0 in | Wt 174.2 lb

## 2017-01-29 DIAGNOSIS — C9210 Chronic myeloid leukemia, BCR/ABL-positive, not having achieved remission: Secondary | ICD-10-CM | POA: Diagnosis not present

## 2017-01-29 DIAGNOSIS — Z8546 Personal history of malignant neoplasm of prostate: Secondary | ICD-10-CM

## 2017-01-29 LAB — CBC WITH DIFFERENTIAL/PLATELET
BASO%: 0.6 % (ref 0.0–2.0)
BASOS ABS: 0.1 10*3/uL (ref 0.0–0.1)
EOS ABS: 0.1 10*3/uL (ref 0.0–0.5)
EOS%: 1.2 % (ref 0.0–7.0)
HEMATOCRIT: 43.4 % (ref 38.4–49.9)
HEMOGLOBIN: 14.4 g/dL (ref 13.0–17.1)
LYMPH#: 3 10*3/uL (ref 0.9–3.3)
LYMPH%: 28.1 % (ref 14.0–49.0)
MCH: 31.4 pg (ref 27.2–33.4)
MCHC: 33.1 g/dL (ref 32.0–36.0)
MCV: 94.7 fL (ref 79.3–98.0)
MONO#: 0.9 10*3/uL (ref 0.1–0.9)
MONO%: 8.2 % (ref 0.0–14.0)
NEUT%: 61.9 % (ref 39.0–75.0)
NEUTROS ABS: 6.7 10*3/uL — AB (ref 1.5–6.5)
Platelets: 235 10*3/uL (ref 140–400)
RBC: 4.58 10*6/uL (ref 4.20–5.82)
RDW: 12.8 % (ref 11.0–14.6)
WBC: 10.8 10*3/uL — AB (ref 4.0–10.3)

## 2017-01-29 LAB — LACTATE DEHYDROGENASE: LDH: 200 U/L (ref 125–245)

## 2017-01-29 LAB — COMPREHENSIVE METABOLIC PANEL
ALBUMIN: 4.1 g/dL (ref 3.5–5.0)
ALK PHOS: 90 U/L (ref 40–150)
ALT: 19 U/L (ref 0–55)
AST: 21 U/L (ref 5–34)
Anion Gap: 10 mEq/L (ref 3–11)
BILIRUBIN TOTAL: 0.59 mg/dL (ref 0.20–1.20)
BUN: 16.2 mg/dL (ref 7.0–26.0)
CALCIUM: 9.7 mg/dL (ref 8.4–10.4)
CO2: 24 mEq/L (ref 22–29)
Chloride: 108 mEq/L (ref 98–109)
Creatinine: 1.3 mg/dL (ref 0.7–1.3)
EGFR: >60 mL/min/{1.73_m2} (ref >60)
GLUCOSE: 94 mg/dL (ref 70–140)
POTASSIUM: 4.3 meq/L (ref 3.5–5.1)
SODIUM: 142 meq/L (ref 136–145)
TOTAL PROTEIN: 7.4 g/dL (ref 6.4–8.3)

## 2017-01-29 NOTE — Progress Notes (Signed)
[facility] [phone]   [phone]) [phone]  OFFICE PROGRESS NOTE  [name], [name] [place] [address]  DIAGNOSIS:  1) Chronic myeloid leukemia diagnosed in November of 2014. 2) History of prostate adenocarcinoma diagnosed in 2006  PRIOR THERAPY:  1) Status post prostatectomy. 2) Gleevec 400 mg by mouth daily, started 02/10/2013, discontinued on 09/13/2016 secondary to disease progression.  CURRENT THERAPY: Tasigna 400 mg by mouth twice a day. First dose started 09/21/2016.  INTERVAL HISTORY: [name] 61 y.o. male returns to the clinic today for follow-up visit.  The patient is feeling fine today with no specific complaints.  He was recently diagnosed with upper respiratory infection and started on treatment with doxycycline.  He continues to have mild cough.  He denied having any adverse effects from his treatment with Tasigna.  He has no chest pain, shortness of breath or hemoptysis.  He denied having any weight loss or night sweats.  He has no nausea, vomiting, diarrhea or constipation.  He is here today for evaluation and repeat blood work.   MEDICAL HISTORY: Past Medical History:  Diagnosis Date  . Arthritis   . Bladder disorder   . Cancer ([facility])    hx prostate cancer and CML  . Chronic myelocytic leukemia ([facility])   . Coronary artery disease    with prior MI 2002(BMS) and Cfx(2006) and RCA (2004) stents . EF 45-50%  . Coronary atherosclerosis of native coronary artery    stable and suspect a component of CAS  . Diverticulosis    seen on colonoscopy in 2008  . Goals of care, counseling/discussion 09/13/2016  . H/O colonoscopy 2014  . Hyperlipidemia   . Hypertension   . Myocardial infarction ([facility]) 02,6/12  . Prostate cancer ([facility])     ALLERGIES:  is allergic to contrast media [iodinated diagnostic agents].  MEDICATIONS:  Current Outpatient Medications  Medication Sig Dispense Refill  .  acetaminophen-codeine 120-12 MG/5ML suspension Take every 8 hours for cough. 120 mL 0  . Ascorbic Acid (VITAMIN C) 1000 MG tablet Take 1,000 mg by mouth daily.    Marland Kitchen aspirin 81 MG chewable tablet Chew 162 mg by mouth daily.     . benzonatate (TESSALON) 200 MG capsule TAKE 1 CAPSULE BY MOUTH TWICE DAILY AS NEEDED FOR COUGH 20 capsule 0  . cholecalciferol (VITAMIN D) 1000 UNITS tablet Take 5,000 Units by mouth daily.     . Coenzyme Q10 (COQ10) 100 MG CAPS Take 100 mg by mouth daily.    . cyclobenzaprine (FLEXERIL) 10 MG tablet Take 10 mg by mouth.    . darifenacin (ENABLEX) 15 MG 24 hr tablet Take 15 mg by mouth daily.     Marland Kitchen dexlansoprazole (DEXILANT) 60 MG capsule Take 60 mg by mouth daily.    . diclofenac (VOLTAREN) 75 MG EC tablet   0  . doxycycline (VIBRA-TABS) 100 MG tablet   0  . FLUARIX QUADRIVALENT 0.5 ML injection   0  . fluticasone (FLONASE) 50 MCG/ACT nasal spray Place 2 sprays into both nostrils daily as needed for allergies or rhinitis (congestion).    . hydrOXYzine (ATARAX/VISTARIL) 25 MG tablet Take 50 mg by mouth daily.    . irbesartan (AVAPRO) 75 MG tablet Take 0.5 tablets (37.5 mg total) by mouth daily. Take place of valsartan 40mg  15 tablet 5  . isosorbide mononitrate (IMDUR) 60 MG 24 hr tablet TAKE ONE TABLET BY MOUTH ONCE DAILY 90 tablet 3  .  levocetirizine (XYZAL) 5 MG tablet Take 1 tablet (5 mg total) by mouth every evening. 30 tablet 1  . magnesium oxide (MAG-OX) 400 MG tablet Take 400 mg by mouth daily as needed (cramps).    . meloxicam (MOBIC) 15 MG tablet Take 15 mg by mouth daily.  1  . metoprolol succinate (TOPROL-XL) 100 MG 24 hr tablet TAKE ONE TABLET BY MOUTH WITH MEALS OR  IMMEDIATELY  FOLLOWING 90 tablet 3  . MYRBETRIQ 50 MG TB24 tablet daily.  1  . nilotinib (TASIGNA) 200 MG capsule TAKE 2 CAPSULES (400MG ) BY MOUTH EVERY 12 HOURS ON AN EMPTY STOMACH 1 HOUR BEFORE OR 2 HOURS AFTER A MEAL 112 capsule 1  . nitroGLYCERIN (NITROLINGUAL) 0.4 MG/SPRAY spray Place 1  spray under the tongue every 5 (five) minutes x 3 doses as needed for chest pain. 12 g 2  . PROAIR HFA 108 (90 Base) MCG/ACT inhaler   0  . SHINGRIX injection   0  . valsartan (DIOVAN) 40 MG tablet TAKE ONE TABLET BY MOUTH ONCE DAILY 90 tablet 3  . rosuvastatin (CRESTOR) 20 MG tablet Take 1 tablet (20 mg total) by mouth daily. 90 tablet 3   No current facility-administered medications for this visit.     SURGICAL HISTORY:  Past Surgical History:  Procedure Laterality Date  . CARDIAC CATHETERIZATION     normal EF, patent stents without obstructive disease (on Plavix X 1 month only)  . CAROTID STENT    . FRACTURE SURGERY     rt wrist  . LEFT HEART CATHETERIZATION WITH CORONARY ANGIOGRAM N/A 02/04/2012   Procedure: LEFT HEART CATHETERIZATION WITH CORONARY ANGIOGRAM;  Surgeon: Sinclair Grooms, MD;  Location: [facility] CATH LAB;  Service: Cardiovascular;  Laterality: N/A;  . ORIF ANKLE FRACTURE  07/04/2011   Procedure: OPEN REDUCTION INTERNAL FIXATION (ORIF) ANKLE FRACTURE;  Surgeon: Alta Corning, MD;  Location: [facility];  Service: Orthopedics;  Laterality: Right;  . TOE SURGERY     rt great toe  . TONSILLECTOMY    . WRIST SURGERY     rt and lt cysts removed    REVIEW OF SYSTEMS:  A comprehensive review of systems was negative except for: Constitutional: positive for fatigue Respiratory: positive for cough   PHYSICAL EXAMINATION: General appearance: alert, cooperative and no distress Head: Normocephalic, without obvious abnormality, atraumatic Neck: no adenopathy, no JVD, supple, symmetrical, trachea midline and thyroid not enlarged, symmetric, no tenderness/mass/nodules Lymph nodes: Cervical, supraclavicular, and axillary nodes normal. Resp: clear to auscultation bilaterally Back: symmetric, no curvature. ROM normal. No CVA tenderness. Cardio: regular rate and rhythm, S1, S2 normal, no murmur, click, rub or gallop GI: soft, non-tender; bowel sounds normal; no masses,  no  organomegaly Extremities: extremities normal, atraumatic, no cyanosis or edema    ECOG PERFORMANCE STATUS: 0 - Asymptomatic  Blood pressure (!) 125/96, pulse 77, temperature 98.4 F (36.9 C), temperature source Oral, resp. rate 20, height 5\' 8"  (1.727 m), weight 174 lb 3.2 oz (79 kg), SpO2 100 %.  LABORATORY DATA: Lab Results  Component Value Date   WBC 10.8 (H) 01/29/2017   HGB 14.4 01/29/2017   HCT 43.4 01/29/2017   MCV 94.7 01/29/2017   PLT 235 01/29/2017      Chemistry      Component Value Date/Time   NA 143 12/28/2016 1444   K 4.1 12/28/2016 1444   CL 105 12/13/2013 1045   CO2 24 12/28/2016 1444   BUN 15.9 12/28/2016 1444  CREATININE 1.3 12/28/2016 1444      Component Value Date/Time   CALCIUM 9.1 12/28/2016 1444   ALKPHOS 158 (H) 12/28/2016 1444   AST 43 (H) 12/28/2016 1444   ALT 43 12/28/2016 1444   BILITOT 0.60 12/28/2016 1444       RADIOGRAPHIC STUDIES: No results found.  ASSESSMENT AND PLAN:  This is a very pleasant 61 years old African-American male with chronic myeloid leukemia diagnosed in November 2014 and has been on treatment with Gleevec 400 mg by mouth daily since that time and has been tolerating the treatment well. The patient had evidence for molecular relapse 2 months ago and he was started on treatment with Tasigna 400 mg by mouth twice a day status post 4 months. The patient has been tolerating this treatment well with no significant adverse effects. CBC today showed mild elevation of the total white blood count but this is likely secondary to his recent upper respiratory infection. I recommended for the patient to continue his current treatment with Tasigna with the same dose. He was advised to call immediately if he has any concerning symptoms in the interval. The patient voices understanding of current disease status and treatment options and is in agreement with the current care plan. All questions were answered. The patient knows to call  the clinic with any problems, questions or concerns. We can certainly see the patient much sooner if necessary. I spent 10 minutes counseling the patient face to face. The total time spent in the appointment was 15 minutes. Disclaimer: This note was dictated with voice recognition software. Similar sounding words can inadvertently be transcribed and may not be corrected upon review.

## 2017-01-29 NOTE — Telephone Encounter (Signed)
Gave avs and calendar for December  °

## 2017-03-01 ENCOUNTER — Other Ambulatory Visit (HOSPITAL_BASED_OUTPATIENT_CLINIC_OR_DEPARTMENT_OTHER): Payer: BLUE CROSS/BLUE SHIELD

## 2017-03-01 ENCOUNTER — Telehealth: Payer: Self-pay | Admitting: Oncology

## 2017-03-01 ENCOUNTER — Encounter: Payer: Self-pay | Admitting: Oncology

## 2017-03-01 ENCOUNTER — Ambulatory Visit (HOSPITAL_BASED_OUTPATIENT_CLINIC_OR_DEPARTMENT_OTHER): Payer: BLUE CROSS/BLUE SHIELD | Admitting: Oncology

## 2017-03-01 VITALS — BP 118/93 | HR 69 | Temp 98.6°F | Resp 20 | Ht 68.0 in | Wt 180.2 lb

## 2017-03-01 DIAGNOSIS — C9212 Chronic myeloid leukemia, BCR/ABL-positive, in relapse: Secondary | ICD-10-CM | POA: Diagnosis not present

## 2017-03-01 DIAGNOSIS — Z8546 Personal history of malignant neoplasm of prostate: Secondary | ICD-10-CM | POA: Diagnosis not present

## 2017-03-01 DIAGNOSIS — C9210 Chronic myeloid leukemia, BCR/ABL-positive, not having achieved remission: Secondary | ICD-10-CM

## 2017-03-01 DIAGNOSIS — Z5111 Encounter for antineoplastic chemotherapy: Secondary | ICD-10-CM

## 2017-03-01 LAB — COMPREHENSIVE METABOLIC PANEL
ALT: 22 U/L (ref 0–55)
ANION GAP: 9 meq/L (ref 3–11)
AST: 20 U/L (ref 5–34)
Albumin: 4 g/dL (ref 3.5–5.0)
Alkaline Phosphatase: 68 U/L (ref 40–150)
BUN: 14.5 mg/dL (ref 7.0–26.0)
CHLORIDE: 107 meq/L (ref 98–109)
CO2: 24 meq/L (ref 22–29)
CREATININE: 1.3 mg/dL (ref 0.7–1.3)
Calcium: 9.2 mg/dL (ref 8.4–10.4)
EGFR: >60 mL/min/{1.73_m2} (ref >60)
Glucose: 95 mg/dl (ref 70–140)
Potassium: 4.3 mEq/L (ref 3.5–5.1)
Sodium: 140 mEq/L (ref 136–145)
Total Bilirubin: 0.55 mg/dL (ref 0.20–1.20)
Total Protein: 7.1 g/dL (ref 6.4–8.3)

## 2017-03-01 LAB — CBC WITH DIFFERENTIAL/PLATELET
BASO%: 0.7 % (ref 0.0–2.0)
Basophils Absolute: 0.1 10*3/uL (ref 0.0–0.1)
EOS%: 1.6 % (ref 0.0–7.0)
Eosinophils Absolute: 0.1 10*3/uL (ref 0.0–0.5)
HCT: 40.9 % (ref 38.4–49.9)
HGB: 14.2 g/dL (ref 13.0–17.1)
LYMPH%: 37.5 % (ref 14.0–49.0)
MCH: 31.8 pg (ref 27.2–33.4)
MCHC: 34.7 g/dL (ref 32.0–36.0)
MCV: 91.7 fL (ref 79.3–98.0)
MONO#: 1 10*3/uL — ABNORMAL HIGH (ref 0.1–0.9)
MONO%: 12.9 % (ref 0.0–14.0)
NEUT#: 3.5 10*3/uL (ref 1.5–6.5)
NEUT%: 47.3 % (ref 39.0–75.0)
PLATELETS: 200 10*3/uL (ref 140–400)
RBC: 4.46 10*6/uL (ref 4.20–5.82)
RDW: 14.3 % (ref 11.0–14.6)
WBC: 7.4 10*3/uL (ref 4.0–10.3)
lymph#: 2.8 10*3/uL (ref 0.9–3.3)

## 2017-03-01 LAB — LACTATE DEHYDROGENASE: LDH: 190 U/L (ref 125–245)

## 2017-03-01 NOTE — Progress Notes (Signed)
[facility] OFFICE PROGRESS NOTE  [name], [address] [place] [address]  DIAGNOSIS:  1) Chronic myeloid leukemia diagnosed in November of 2014. 2) History of prostate adenocarcinoma diagnosed in 2006  PRIOR THERAPY: 1) Status post prostatectomy. 2) Gleevec 400 mg by mouth daily, started 02/10/2013, discontinued on 09/13/2016 secondary to disease progression.  CURRENT THERAPY: Tasigna 400 mg by mouth twice a day. First dose started 09/21/2016.  INTERVAL HISTORY: [name] 61 y.o. male returns for routine follow-up visit by himself.  The patient is feeling fine and has no specific complaints.  He denies fevers and chills.  Denies chest pain, shortness breath, cough, hemoptysis.  Denies nausea, vomiting, constipation, diarrhea.  He denies having any weight loss or night sweats.  The patient is here for evaluation and repeat blood work.  MEDICAL HISTORY: Past Medical History:  Diagnosis Date  . Arthritis   . Bladder disorder   . Cancer ([facility])    hx prostate cancer and CML  . Chronic myelocytic leukemia ([facility])   . Coronary artery disease    with prior MI 2002(BMS) and Cfx(2006) and RCA (2004) stents . EF 45-50%  . Coronary atherosclerosis of native coronary artery    stable and suspect a component of CAS  . Diverticulosis    seen on colonoscopy in 2008  . Goals of care, counseling/discussion 09/13/2016  . H/O colonoscopy 2014  . Hyperlipidemia   . Hypertension   . Myocardial infarction ([facility]) 02,6/12  . Prostate cancer ([facility])     ALLERGIES:  is allergic to contrast media [iodinated diagnostic agents].  MEDICATIONS:  Current Outpatient Medications  Medication Sig Dispense Refill  . acetaminophen-codeine 120-12 MG/5ML suspension Take every 8 hours for cough. 120 mL 0  . Ascorbic Acid (VITAMIN C) 1000 MG tablet Take 1,000 mg by mouth daily.    Marland Kitchen aspirin 81 MG chewable tablet Chew 162 mg by mouth daily.     . benzonatate  (TESSALON) 200 MG capsule TAKE 1 CAPSULE BY MOUTH TWICE DAILY AS NEEDED FOR COUGH 20 capsule 0  . cholecalciferol (VITAMIN D) 1000 UNITS tablet Take 5,000 Units by mouth daily.     . Coenzyme Q10 (COQ10) 100 MG CAPS Take 100 mg by mouth daily.    . cyclobenzaprine (FLEXERIL) 10 MG tablet Take 10 mg by mouth.    . darifenacin (ENABLEX) 15 MG 24 hr tablet Take 15 mg by mouth daily.     Marland Kitchen dexlansoprazole (DEXILANT) 60 MG capsule Take 60 mg by mouth daily.    . diclofenac (VOLTAREN) 75 MG EC tablet   0  . doxycycline (VIBRA-TABS) 100 MG tablet   0  . FLUARIX QUADRIVALENT 0.5 ML injection   0  . fluticasone (FLONASE) 50 MCG/ACT nasal spray Place 2 sprays into both nostrils daily as needed for allergies or rhinitis (congestion).    . hydrOXYzine (ATARAX/VISTARIL) 25 MG tablet Take 50 mg by mouth daily.    . irbesartan (AVAPRO) 75 MG tablet Take 0.5 tablets (37.5 mg total) by mouth daily. Take place of valsartan 40mg  15 tablet 5  . isosorbide mononitrate (IMDUR) 60 MG 24 hr tablet TAKE ONE TABLET BY MOUTH ONCE DAILY 90 tablet 3  . levocetirizine (XYZAL) 5 MG tablet Take 1 tablet (5 mg total) by mouth every evening. 30 tablet 1  . magnesium oxide (MAG-OX) 400 MG tablet Take 400 mg by mouth daily as needed (cramps).    . meloxicam (MOBIC) 15 MG tablet Take 15  mg by mouth daily.  1  . metoprolol succinate (TOPROL-XL) 100 MG 24 hr tablet TAKE ONE TABLET BY MOUTH WITH MEALS OR  IMMEDIATELY  FOLLOWING 90 tablet 3  . MYRBETRIQ 50 MG TB24 tablet daily.  1  . nilotinib (TASIGNA) 200 MG capsule TAKE 2 CAPSULES (400MG ) BY MOUTH EVERY 12 HOURS ON AN EMPTY STOMACH 1 HOUR BEFORE OR 2 HOURS AFTER A MEAL 112 capsule 1  . nitroGLYCERIN (NITROLINGUAL) 0.4 MG/SPRAY spray Place 1 spray under the tongue every 5 (five) minutes x 3 doses as needed for chest pain. 12 g 2  . PROAIR HFA 108 (90 Base) MCG/ACT inhaler   0  . rosuvastatin (CRESTOR) 20 MG tablet Take 1 tablet (20 mg total) by mouth daily. 90 tablet 3  . SHINGRIX  injection   0  . valsartan (DIOVAN) 40 MG tablet TAKE ONE TABLET BY MOUTH ONCE DAILY 90 tablet 3   No current facility-administered medications for this visit.     SURGICAL HISTORY:  Past Surgical History:  Procedure Laterality Date  . CARDIAC CATHETERIZATION     normal EF, patent stents without obstructive disease (on Plavix X 1 month only)  . CAROTID STENT    . FRACTURE SURGERY     rt wrist  . LEFT HEART CATHETERIZATION WITH CORONARY ANGIOGRAM N/A 02/04/2012   Procedure: LEFT HEART CATHETERIZATION WITH CORONARY ANGIOGRAM;  Surgeon: Sinclair Grooms, MD;  Location: [facility] CATH LAB;  Service: Cardiovascular;  Laterality: N/A;  . ORIF ANKLE FRACTURE  07/04/2011   Procedure: OPEN REDUCTION INTERNAL FIXATION (ORIF) ANKLE FRACTURE;  Surgeon: Alta Corning, MD;  Location: [facility];  Service: Orthopedics;  Laterality: Right;  . TOE SURGERY     rt great toe  . TONSILLECTOMY    . WRIST SURGERY     rt and lt cysts removed    REVIEW OF SYSTEMS:   Review of Systems  Constitutional: Negative for appetite change, chills, fatigue, fever and unexpected weight change.  HENT:   Negative for mouth sores, nosebleeds, sore throat and trouble swallowing.   Eyes: Negative for eye problems and icterus.  Respiratory: Negative for cough, hemoptysis, shortness of breath and wheezing.   Cardiovascular: Negative for chest pain and leg swelling.  Gastrointestinal: Negative for abdominal pain, constipation, diarrhea, nausea and vomiting.  Genitourinary: Negative for bladder incontinence, difficulty urinating, dysuria, frequency and hematuria.   Musculoskeletal: Negative for back pain, gait problem, neck pain and neck stiffness.  Skin: Negative for itching and rash.  Neurological: Negative for dizziness, extremity weakness, gait problem, headaches, light-headedness and seizures.  Hematological: Negative for adenopathy. Does not bruise/bleed easily.  Psychiatric/Behavioral: Negative for confusion,  depression and sleep disturbance. The patient is not nervous/anxious.     PHYSICAL EXAMINATION:  Blood pressure (!) 118/93, pulse 69, temperature 98.6 F (37 C), temperature source Oral, resp. rate 20, height 5\' 8"  (1.727 m), weight 180 lb 3.2 oz (81.7 kg), SpO2 100 %.  ECOG PERFORMANCE STATUS: 0 - Asymptomatic  Physical Exam  Constitutional: Oriented to person, place, and time and well-developed, well-nourished, and in no distress. No distress.  HENT:  Head: Normocephalic and atraumatic.  Mouth/Throat: Oropharynx is clear and moist. No oropharyngeal exudate.  Eyes: Conjunctivae are normal. Right eye exhibits no discharge. Left eye exhibits no discharge. No scleral icterus.  Neck: Normal range of motion. Neck supple.  Cardiovascular: Normal rate, regular rhythm, normal heart sounds and intact distal pulses.   Pulmonary/Chest: Effort normal and breath sounds normal. No respiratory  distress. No wheezes. No rales.  Abdominal: Soft. Bowel sounds are normal. Exhibits no distension and no mass. There is no tenderness.  Musculoskeletal: Normal range of motion. Exhibits no edema.  Lymphadenopathy:    No cervical adenopathy.  Neurological: Alert and oriented to person, place, and time. Exhibits normal muscle tone. Gait normal. Coordination normal.  Skin: Skin is warm and dry. No rash noted. Not diaphoretic. No erythema. No pallor.  Psychiatric: Mood, memory and judgment normal.  Vitals reviewed.  LABORATORY DATA: Lab Results  Component Value Date   WBC 7.4 03/01/2017   HGB 14.2 03/01/2017   HCT 40.9 03/01/2017   MCV 91.7 03/01/2017   PLT 200 03/01/2017      Chemistry      Component Value Date/Time   NA 140 03/01/2017 1117   K 4.3 03/01/2017 1117   CL 105 12/13/2013 1045   CO2 24 03/01/2017 1117   BUN 14.5 03/01/2017 1117   CREATININE 1.3 03/01/2017 1117      Component Value Date/Time   CALCIUM 9.2 03/01/2017 1117   ALKPHOS 68 03/01/2017 1117   AST 20 03/01/2017 1117   ALT 22  03/01/2017 1117   BILITOT 0.55 03/01/2017 1117       RADIOGRAPHIC STUDIES:  No results found.   ASSESSMENT/PLAN:  Chronic myeloid leukemia This is a very pleasant 61 year old African-American male with chronic myeloid leukemia diagnosed in November 2014 and has been on treatment with Gleevec 400 mg by mouth daily since that time and has been tolerating the treatment well. The patient had evidence for molecular relapse and he was started on treatment with Tasigna 400 mg by mouth twice a day status post 5 months. The patient has been tolerating this treatment well with no significant adverse effects. CBC today shows a normal white blood cell count. I recommended for the patient to continue his current treatment with Tasigna with the same dose. He will have a follow-up visit in approximately 1 month for repeat lab work.  He was advised to call immediately if he has any concerning symptoms in the interval. The patient voices understanding of current disease status and treatment options and is in agreement with the current care plan. All questions were answered. The patient knows to call the clinic with any problems, questions or concerns. We can certainly see the patient much sooner if necessary.  Orders Placed This Encounter  Procedures  . CBC with Differential/Platelet    Standing Status:   Future    Standing Expiration Date:   03/01/2018  . Comprehensive metabolic panel    Standing Status:   Future    Standing Expiration Date:   03/01/2018  . Lactate dehydrogenase    Standing Status:   Future    Standing Expiration Date:   03/01/2018    Mikey Bussing, DNP, AGPCNP-BC, AOCNP 03/01/17

## 2017-03-01 NOTE — Assessment & Plan Note (Signed)
This is a very pleasant 61 year old African-American male with chronic myeloid leukemia diagnosed in November 2014 and has been on treatment with Gleevec 400 mg by mouth daily since that time and has been tolerating the treatment well. The patient had evidence for molecular relapse and he was started on treatment with Tasigna 400 mg by mouth twice a day status post 5 months. The patient has been tolerating this treatment well with no significant adverse effects. CBC today shows a normal white blood cell count. I recommended for the patient to continue his current treatment with Tasigna with the same dose. He will have a follow-up visit in approximately 1 month for repeat lab work.  He was advised to call immediately if he has any concerning symptoms in the interval. The patient voices understanding of current disease status and treatment options and is in agreement with the current care plan. All questions were answered. The patient knows to call the clinic with any problems, questions or concerns. We can certainly see the patient much sooner if necessary.

## 2017-03-01 NOTE — Telephone Encounter (Signed)
Scheduled appt per 12/28 los - Gave patient AVS and calender per los.

## 2017-03-07 ENCOUNTER — Other Ambulatory Visit: Payer: Self-pay | Admitting: Internal Medicine

## 2017-03-07 DIAGNOSIS — C9210 Chronic myeloid leukemia, BCR/ABL-positive, not having achieved remission: Secondary | ICD-10-CM

## 2017-03-07 DIAGNOSIS — I10 Essential (primary) hypertension: Secondary | ICD-10-CM

## 2017-03-22 ENCOUNTER — Telehealth: Payer: Self-pay | Admitting: Medical Oncology

## 2017-03-22 NOTE — Telephone Encounter (Signed)
I told pt to call [facility] for [facility] delivery.

## 2017-04-01 ENCOUNTER — Encounter: Payer: Self-pay | Admitting: Internal Medicine

## 2017-04-01 ENCOUNTER — Inpatient Hospital Stay: Payer: BLUE CROSS/BLUE SHIELD

## 2017-04-01 ENCOUNTER — Telehealth: Payer: Self-pay | Admitting: Internal Medicine

## 2017-04-01 ENCOUNTER — Inpatient Hospital Stay: Payer: BLUE CROSS/BLUE SHIELD | Attending: Internal Medicine | Admitting: Internal Medicine

## 2017-04-01 VITALS — BP 126/82 | HR 71 | Temp 98.7°F | Resp 18 | Ht 68.0 in | Wt 185.3 lb

## 2017-04-01 DIAGNOSIS — E785 Hyperlipidemia, unspecified: Secondary | ICD-10-CM | POA: Diagnosis not present

## 2017-04-01 DIAGNOSIS — I252 Old myocardial infarction: Secondary | ICD-10-CM | POA: Diagnosis not present

## 2017-04-01 DIAGNOSIS — I10 Essential (primary) hypertension: Secondary | ICD-10-CM | POA: Insufficient documentation

## 2017-04-01 DIAGNOSIS — C9210 Chronic myeloid leukemia, BCR/ABL-positive, not having achieved remission: Secondary | ICD-10-CM | POA: Insufficient documentation

## 2017-04-01 DIAGNOSIS — Z7982 Long term (current) use of aspirin: Secondary | ICD-10-CM | POA: Insufficient documentation

## 2017-04-01 DIAGNOSIS — I2510 Atherosclerotic heart disease of native coronary artery without angina pectoris: Secondary | ICD-10-CM | POA: Diagnosis not present

## 2017-04-01 DIAGNOSIS — Z8546 Personal history of malignant neoplasm of prostate: Secondary | ICD-10-CM | POA: Diagnosis not present

## 2017-04-01 DIAGNOSIS — Z79899 Other long term (current) drug therapy: Secondary | ICD-10-CM | POA: Diagnosis not present

## 2017-04-01 LAB — COMPREHENSIVE METABOLIC PANEL
ALT: 13 U/L (ref 0–55)
ANION GAP: 7 (ref 3–11)
AST: 16 U/L (ref 5–34)
Albumin: 3.9 g/dL (ref 3.5–5.0)
Alkaline Phosphatase: 61 U/L (ref 40–150)
BUN: 13 mg/dL (ref 7–26)
CO2: 25 mmol/L (ref 22–29)
Calcium: 9 mg/dL (ref 8.4–10.4)
Chloride: 109 mmol/L (ref 98–109)
Creatinine, Ser: 1.16 mg/dL (ref 0.70–1.30)
Glucose, Bld: 120 mg/dL (ref 70–140)
POTASSIUM: 3.9 mmol/L (ref 3.5–5.1)
Sodium: 141 mmol/L (ref 136–145)
Total Bilirubin: 0.4 mg/dL (ref 0.2–1.2)
Total Protein: 6.7 g/dL (ref 6.4–8.3)

## 2017-04-01 LAB — CBC WITH DIFFERENTIAL/PLATELET
BASOS ABS: 0.1 10*3/uL (ref 0.0–0.1)
Basophils Relative: 2 %
EOS PCT: 1 %
Eosinophils Absolute: 0.1 10*3/uL (ref 0.0–0.5)
HCT: 39.8 % (ref 38.4–49.9)
HEMOGLOBIN: 13.4 g/dL (ref 13.0–17.1)
LYMPHS PCT: 35 %
Lymphs Abs: 2.4 10*3/uL (ref 0.9–3.3)
MCH: 31.3 pg (ref 27.2–33.4)
MCHC: 33.6 g/dL (ref 32.0–36.0)
MCV: 93.4 fL (ref 79.3–98.0)
Monocytes Absolute: 0.6 10*3/uL (ref 0.1–0.9)
Monocytes Relative: 9 %
NEUTROS ABS: 3.6 10*3/uL (ref 1.5–6.5)
NEUTROS PCT: 53 %
PLATELETS: 222 10*3/uL (ref 140–400)
RBC: 4.27 MIL/uL (ref 4.20–5.82)
RDW: 14.2 % (ref 11.0–15.6)
WBC: 6.9 10*3/uL (ref 4.0–10.3)

## 2017-04-01 LAB — LACTATE DEHYDROGENASE: LDH: 178 U/L (ref 125–245)

## 2017-04-01 NOTE — Telephone Encounter (Signed)
Scheduled appt per 1/28 los - Gave patient AVS and calender per los.  

## 2017-04-01 NOTE — Progress Notes (Signed)
[place] [phone]   [phone]  OFFICE PROGRESS NOTE  [name], [address] [place] [address]  DIAGNOSIS:  1) Chronic myeloid leukemia diagnosed in November of 2014. 2) History of prostate adenocarcinoma diagnosed in 2006  PRIOR THERAPY:  1) Status post prostatectomy. 2) Gleevec 400 mg by mouth daily, started 02/10/2013, discontinued on 09/13/2016 secondary to disease progression.  CURRENT THERAPY: Tasigna 400 mg by mouth twice a day. First dose started 09/21/2016.  Status post 6 months of treatment.  INTERVAL HISTORY: [name] 62 y.o. male returns to the clinic today for follow-up visit.  The patient is feeling fine with no specific complaints.  He continues to tolerate his treatment with Weyman Croon fairly well.  He denied having any chest pain, shortness of breath, cough or hemoptysis.  He denied having any fever or chills.  He has no nausea, vomiting, diarrhea or constipation.  He denied having any recent weight loss or night sweats.  He is here today for evaluation and repeat blood work.  MEDICAL HISTORY: Past Medical History:  Diagnosis Date  . Arthritis   . Bladder disorder   . Cancer ([place])    hx prostate cancer and CML  . Chronic myelocytic leukemia ([place])   . Coronary artery disease    with prior MI 2002(BMS) and Cfx(2006) and RCA (2004) stents . EF 45-50%  . Coronary atherosclerosis of native coronary artery    stable and suspect a component of CAS  . Diverticulosis    seen on colonoscopy in 2008  . Goals of care, counseling/discussion 09/13/2016  . H/O colonoscopy 2014  . Hyperlipidemia   . Hypertension   . Myocardial infarction ([place]) 02,6/12  . Prostate cancer ([place])     ALLERGIES:  is allergic to contrast media [iodinated diagnostic agents].  MEDICATIONS:  Current Outpatient Medications  Medication Sig Dispense Refill  . acetaminophen-codeine 120-12 MG/5ML suspension Take every  8 hours for cough. 120 mL 0  . Ascorbic Acid (VITAMIN C) 1000 MG tablet Take 1,000 mg by mouth daily.    Marland Kitchen aspirin 81 MG chewable tablet Chew 162 mg by mouth daily.     . benzonatate (TESSALON) 200 MG capsule TAKE 1 CAPSULE BY MOUTH TWICE DAILY AS NEEDED FOR COUGH 20 capsule 0  . cholecalciferol (VITAMIN D) 1000 UNITS tablet Take 5,000 Units by mouth daily.     . Coenzyme Q10 (COQ10) 100 MG CAPS Take 100 mg by mouth daily.    . cyclobenzaprine (FLEXERIL) 10 MG tablet Take 10 mg by mouth.    . darifenacin (ENABLEX) 15 MG 24 hr tablet Take 15 mg by mouth daily.     Marland Kitchen dexlansoprazole (DEXILANT) 60 MG capsule Take 60 mg by mouth daily.    . diclofenac (VOLTAREN) 75 MG EC tablet   0  . doxycycline (VIBRA-TABS) 100 MG tablet   0  . FLUARIX QUADRIVALENT 0.5 ML injection   0  . fluticasone (FLONASE) 50 MCG/ACT nasal spray Place 2 sprays into both nostrils daily as needed for allergies or rhinitis (congestion).    . hydrOXYzine (ATARAX/VISTARIL) 25 MG tablet Take 50 mg by mouth daily.    . irbesartan (AVAPRO) 75 MG tablet Take 0.5 tablets (37.5 mg total) by mouth daily. Take place of valsartan 40mg  15 tablet 5  . isosorbide mononitrate (IMDUR) 60 MG 24 hr tablet TAKE ONE TABLET BY MOUTH ONCE DAILY 90 tablet 3  . levocetirizine (XYZAL) 5 MG tablet  Take 1 tablet (5 mg total) by mouth every evening. 30 tablet 1  . magnesium oxide (MAG-OX) 400 MG tablet Take 400 mg by mouth daily as needed (cramps).    . meloxicam (MOBIC) 15 MG tablet Take 15 mg by mouth daily.  1  . metoprolol succinate (TOPROL-XL) 100 MG 24 hr tablet TAKE ONE TABLET BY MOUTH WITH MEALS OR  IMMEDIATELY  FOLLOWING 90 tablet 3  . MYRBETRIQ 50 MG TB24 tablet daily.  1  . nilotinib (TASIGNA) 200 MG capsule TAKE 2 CAPSULES (400MG ) BY MOUTH EVERY 12 HOURS ON AN EMPTY STOMACH 1 HOUR BEFORE OR 2 HOURS AFTER A MEAL 112 capsule PRN  . nitroGLYCERIN (NITROLINGUAL) 0.4 MG/SPRAY spray Place 1 spray under the tongue every 5 (five) minutes x 3 doses as  needed for chest pain. 12 g 2  . PROAIR HFA 108 (90 Base) MCG/ACT inhaler   0  . rosuvastatin (CRESTOR) 20 MG tablet Take 1 tablet (20 mg total) by mouth daily. 90 tablet 3  . SHINGRIX injection   0  . valsartan (DIOVAN) 40 MG tablet TAKE ONE TABLET BY MOUTH ONCE DAILY 90 tablet 3   No current facility-administered medications for this visit.     SURGICAL HISTORY:  Past Surgical History:  Procedure Laterality Date  . CARDIAC CATHETERIZATION     normal EF, patent stents without obstructive disease (on Plavix X 1 month only)  . CAROTID STENT    . FRACTURE SURGERY     rt wrist  . LEFT HEART CATHETERIZATION WITH CORONARY ANGIOGRAM N/A 02/04/2012   Procedure: LEFT HEART CATHETERIZATION WITH CORONARY ANGIOGRAM;  Surgeon: Sinclair Grooms, MD;  Location: [facility] CATH LAB;  Service: Cardiovascular;  Laterality: N/A;  . ORIF ANKLE FRACTURE  07/04/2011   Procedure: OPEN REDUCTION INTERNAL FIXATION (ORIF) ANKLE FRACTURE;  Surgeon: Alta Corning, MD;  Location: [facility];  Service: Orthopedics;  Laterality: Right;  . TOE SURGERY     rt great toe  . TONSILLECTOMY    . WRIST SURGERY     rt and lt cysts removed    REVIEW OF SYSTEMS:  A comprehensive review of systems was negative.   PHYSICAL EXAMINATION: General appearance: alert, cooperative and no distress Head: Normocephalic, without obvious abnormality, atraumatic Neck: no adenopathy, no JVD, supple, symmetrical, trachea midline and thyroid not enlarged, symmetric, no tenderness/mass/nodules Lymph nodes: Cervical, supraclavicular, and axillary nodes normal. Resp: clear to auscultation bilaterally Back: symmetric, no curvature. ROM normal. No CVA tenderness. Cardio: regular rate and rhythm, S1, S2 normal, no murmur, click, rub or gallop GI: soft, non-tender; bowel sounds normal; no masses,  no organomegaly Extremities: extremities normal, atraumatic, no cyanosis or edema    ECOG PERFORMANCE STATUS: 0 - Asymptomatic  Blood  pressure 126/82, pulse 71, temperature 98.7 F (37.1 C), temperature source Oral, resp. rate 18, height 5\' 8"  (1.727 m), weight 185 lb 4.8 oz (84.1 kg), SpO2 99 %.  LABORATORY DATA: Lab Results  Component Value Date   WBC 6.9 04/01/2017   HGB 13.4 04/01/2017   HCT 39.8 04/01/2017   MCV 93.4 04/01/2017   PLT 222 04/01/2017      Chemistry      Component Value Date/Time   NA 140 03/01/2017 1117   K 4.3 03/01/2017 1117   CL 105 12/13/2013 1045   CO2 24 03/01/2017 1117   BUN 14.5 03/01/2017 1117   CREATININE 1.3 03/01/2017 1117      Component Value Date/Time   CALCIUM 9.2 03/01/2017  2992   EQASTMH 96 03/01/2017 1117   AST 20 03/01/2017 1117   ALT 22 03/01/2017 1117   BILITOT 0.55 03/01/2017 1117       RADIOGRAPHIC STUDIES: No results found.  ASSESSMENT AND PLAN:  This is a very pleasant 62 years old African-American male with chronic myeloid leukemia diagnosed in November 2014 and has been on treatment with Gleevec 400 mg by mouth daily since that time and has been tolerating the treatment well. The patient had evidence for molecular relapse 2 months ago and he was started on treatment with Tasigna 400 mg by mouth twice a day status post 6 months. The patient continues to tolerate this treatment fairly well with no concerning complaints. CBC today is unremarkable.  Comprehensive metabolic panel is a still pending. I recommended for the patient to continue his current treatment with [place]. I will see him back for follow-up visit in 2 months for evaluation after repeating CBC, complaints metabolic panel, LDH as well as molecular studies for BCR/ABL. He was advised to call immediately if he has any concerning symptoms in the interval. The patient voices understanding of current disease status and treatment options and is in agreement with the current care plan. All questions were answered. The patient knows to call the clinic with any problems, questions or concerns. We can  certainly see the patient much sooner if necessary. I spent 10 minutes counseling the patient face to face. The total time spent in the appointment was 15 minutes. Disclaimer: This note was dictated with voice recognition software. Similar sounding words can inadvertently be transcribed and may not be corrected upon review.

## 2017-04-02 ENCOUNTER — Other Ambulatory Visit: Payer: Self-pay | Admitting: Medical Oncology

## 2017-04-02 DIAGNOSIS — I10 Essential (primary) hypertension: Secondary | ICD-10-CM

## 2017-04-02 DIAGNOSIS — C9210 Chronic myeloid leukemia, BCR/ABL-positive, not having achieved remission: Secondary | ICD-10-CM

## 2017-04-02 MED ORDER — NILOTINIB HCL 200 MG PO CAPS: ORAL_CAPSULE | ORAL | 99 refills | Status: DC

## 2017-04-15 ENCOUNTER — Other Ambulatory Visit: Payer: Self-pay

## 2017-04-15 MED ORDER — IRBESARTAN 75 MG PO TABS: 37.5000 mg | ORAL_TABLET | Freq: Every day | ORAL | 0 refills | Status: DC

## 2017-05-05 NOTE — Progress Notes (Signed)
Cardiology Office Note    Date:  05/06/2017   ID:  [name], DOB [birth year], MRN [id number]  PCP:  [name], MD  Cardiologist: Sinclair Grooms, MD   Chief Complaint  Patient presents with  . Coronary Artery Disease  . Chest Pain    History of Present Illness:  [name] is a 62 y.o. male for CAD, hypertension, hyperlipidemia,. Other significant problems include CML, history of prostate cancer, and arthritis.   [name] has had some vague chest discomfort usually lasting less than 10 minutes.  He will eat 2 baby aspirin tablets.  Episodes occur at rest.  Not precipitated by physical activity.  He denies associated shortness of breath, orthopnea, radiation, palpitations, and syncope.   Past Medical History:  Diagnosis Date  . Arthritis   . Bladder disorder   . Cancer ([place])    hx prostate cancer and CML  . Chronic myelocytic leukemia ([place])   . Coronary artery disease    with prior MI 2002(BMS) and Cfx(2006) and RCA (2004) stents . EF 45-50%  . Coronary atherosclerosis of native coronary artery    stable and suspect a component of CAS  . Diverticulosis    seen on colonoscopy in 2008  . Goals of care, counseling/discussion 09/13/2016  . H/O colonoscopy 2014  . Hyperlipidemia   . Hypertension   . Myocardial infarction ([place]) 02,6/12  . Prostate cancer [facility])     Past Surgical History:  Procedure Laterality Date  . CARDIAC CATHETERIZATION     normal EF, patent stents without obstructive disease (on Plavix X 1 month only)  . CAROTID STENT    . FRACTURE SURGERY     rt wrist  . LEFT HEART CATHETERIZATION WITH CORONARY ANGIOGRAM N/A 02/04/2012   Procedure: LEFT HEART CATHETERIZATION WITH CORONARY ANGIOGRAM;  Surgeon: Sinclair Grooms, MD;  Location[facility] CATH LAB;  Service: Cardiovascular;  Laterality: N/A;  . ORIF ANKLE FRACTURE  07/04/2011   Procedure: OPEN REDUCTION INTERNAL FIXATION (ORIF) ANKLE FRACTURE;  Surgeon: Alta Corning, MD;  Location: [place];  Service: Orthopedics;  Laterality: Right;  . TOE SURGERY     rt great toe  . TONSILLECTOMY    . WRIST SURGERY     rt and lt cysts removed    Current Medications: Outpatient Medications Prior to Visit  Medication Sig Dispense Refill  . acetaminophen-codeine 120-12 MG/5ML suspension Take 5 mLs by mouth every 8 (eight) hours as needed (cough).    . Ascorbic Acid (VITAMIN C) 1000 MG tablet Take 1,000 mg by mouth daily.    Marland Kitchen aspirin 81 MG chewable tablet Chew 162 mg by mouth daily.     . benzonatate (TESSALON) 200 MG capsule TAKE 1 CAPSULE BY MOUTH TWICE DAILY AS NEEDED FOR COUGH 20 capsule 0  . cholecalciferol (VITAMIN D) 1000 UNITS tablet Take 5,000 Units by mouth daily.     . Coenzyme Q10 (COQ10) 100 MG CAPS Take 100 mg by mouth daily.    Marland Kitchen darifenacin (ENABLEX) 15 MG 24 hr tablet Take 15 mg by mouth daily.     Marland Kitchen dexlansoprazole (DEXILANT) 60 MG capsule Take 60 mg by mouth daily.    . hydrOXYzine (ATARAX/VISTARIL) 25 MG tablet Take 50 mg by mouth daily.    . irbesartan (AVAPRO) 75 MG tablet Take 0.5 tablets (37.5 mg total) by mouth daily. Take place of valsartan 40mg  15 tablet 0  . isosorbide mononitrate (IMDUR) 60 MG 24 hr tablet TAKE  ONE TABLET BY MOUTH ONCE DAILY 90 tablet 3  . levocetirizine (XYZAL) 5 MG tablet Take one (1) tablet (5 mg) by mouth every evening as needed for allergies.    . magnesium oxide (MAG-OX) 400 MG tablet Take 400 mg by mouth daily as needed (cramps).    . metoprolol succinate (TOPROL-XL) 100 MG 24 hr tablet TAKE ONE TABLET BY MOUTH WITH MEALS OR  IMMEDIATELY  FOLLOWING 90 tablet 3  . MYRBETRIQ 50 MG TB24 tablet Take 50 mg by mouth daily.   1  . nilotinib (TASIGNA) 200 MG capsule TAKE 2 CAPSULES (400MG ) BY MOUTH EVERY 12 HOURS ON AN EMPTY STOMACH 1 HOUR BEFORE OR 2 HOURS AFTER A MEAL 112 capsule PRN  . nitroGLYCERIN (NITROLINGUAL) 0.4 MG/SPRAY spray Place 1 spray under the tongue every 5 (five) minutes x 3 doses as needed for chest pain. 12 g  2  . PROAIR HFA 108 (90 Base) MCG/ACT inhaler Inhale 2 puffs into the lungs every 6 (six) hours as needed for wheezing (cough).   0  . rosuvastatin (CRESTOR) 20 MG tablet Take 20 mg by mouth daily.    Marland Kitchen acetaminophen-codeine 120-12 MG/5ML suspension Take every 8 hours for cough. (Patient not taking: Reported on 05/06/2017) 120 mL 0  . cyclobenzaprine (FLEXERIL) 10 MG tablet Take 10 mg by mouth.    . diclofenac (VOLTAREN) 75 MG EC tablet   0  . doxycycline (VIBRA-TABS) 100 MG tablet   0  . FLUARIX QUADRIVALENT 0.5 ML injection   0  . fluticasone (FLONASE) 50 MCG/ACT nasal spray Place 2 sprays into both nostrils daily as needed for allergies or rhinitis (congestion).    Marland Kitchen levocetirizine (XYZAL) 5 MG tablet Take 1 tablet (5 mg total) by mouth every evening. (Patient not taking: Reported on 05/06/2017) 30 tablet 1  . meloxicam (MOBIC) 15 MG tablet Take 15 mg by mouth daily.  1  . rosuvastatin (CRESTOR) 20 MG tablet Take 1 tablet (20 mg total) by mouth daily. 90 tablet 3  . SHINGRIX injection   0  . valsartan (DIOVAN) 40 MG tablet TAKE ONE TABLET BY MOUTH ONCE DAILY (Patient not taking: Reported on 05/06/2017) 90 tablet 3   No facility-administered medications prior to visit.      Allergies:   Contrast media [iodinated diagnostic agents]   Social History   Socioeconomic History  . Marital status: Legally Separated    Spouse name: None  . Number of children: None  . Years of education: None  . Highest education level: None  Social Needs  . Financial resource strain: None  . Food insecurity - worry: None  . Food insecurity - inability: None  . Transportation needs - medical: None  . Transportation needs - non-medical: None  Occupational History  . None  Tobacco Use  . Smoking status: Former Smoker    Types: Cigarettes    Last attempt to quit: 06/06/2000    Years since quitting: 16.9  . Smokeless tobacco: Never Used  . Tobacco comment: 2002  Substance and Sexual Activity  . Alcohol use:  No    Comment: rare  . Drug use: No  . Sexual activity: None  Other Topics Concern  . None  Social History Narrative  . None     Family History:  The patient's family history includes Cancer in his mother.   ROS:   Please see the history of present illness.    History of prostate cancer.  Also has chronic myelocytic leukemia.  Had  nausea on 1 of the therapies.  Now on a different regimen.  No complications. All other systems reviewed and are negative.   PHYSICAL EXAM:   VS:  BP 126/90   Pulse 71   Ht 5\' 8"  (1.727 m)   Wt 183 lb 6.4 oz (83.2 kg)   BMI 27.89 kg/m    GEN: Well nourished, well developed, in no acute distress  HEENT: normal  Neck: no JVD, carotid bruits, or masses Cardiac: RRR; no murmurs, rubs, or gallops,no edema  Respiratory:  clear to auscultation bilaterally, normal work of breathing GI: soft, nontender, nondistended, + BS MS: no deformity or atrophy  Skin: warm and dry, no rash Neuro:  Alert and Oriented x 3, Strength and sensation are intact Psych: euthymic mood, full affect  Wt Readings from Last 3 Encounters:  05/06/17 183 lb 6.4 oz (83.2 kg)  04/01/17 185 lb 4.8 oz (84.1 kg)  03/01/17 180 lb 3.2 oz (81.7 kg)      Studies/Labs Reviewed:   EKG:  EKG no new data.  Recent Labs: 04/01/2017: ALT 13; BUN 13; Creatinine, Ser 1.16; Hemoglobin 13.4; Platelets 222; Potassium 3.9; Sodium 141   Lipid Panel    Component Value Date/Time   CHOL 159 05/28/2016 1533   TRIG 125 05/28/2016 1533   HDL 42 05/28/2016 1533   CHOLHDL 3.8 05/28/2016 1533   CHOLHDL 3.2 10/18/2015 0819   VLDL 11 10/18/2015 0819   LDLCALC 92 05/28/2016 1533    Additional studies/ records that were reviewed today include:  Last stress test was equivocal 1 year ago.    ASSESSMENT:    1. Coronary artery disease of native artery of native heart with stable angina pectoris ([place])   2. Essential hypertension   3. Type 2 diabetes mellitus with complication, without long-term  current use of insulin ([place])   4. Hyperlipidemia with target low density lipoprotein (LDL) cholesterol less than 70 mg/dL      PLAN:  In order of problems listed above:  1. With vague chest discomfort, would do a stress Myoview to exclude ischemia accounting for the patient's vague spontaneous episodes of discomfort. 2. Blood pressure is mildly elevated with diastolic of 90 mmHg.  Valsartan and irbesartan have been a short supply.  Switch to olmesartan, 20 mg/day.  He will have blood work performed by Dr. Baird Cancer in 2 weeks. 3. Hemoglobin A1c target is less than 70. 4. LDL target is 70 or less.  If no high risk features on nuclear stress test, we will plan to see the patient back in 1 year.  We will follow her current data to Dr. Criss Rosales.  Medication Adjustments/Labs and Tests Ordered: Current medicines are reviewed at length with the patient today.  Concerns regarding medicines are outlined above.  Medication changes, Labs and Tests ordered today are listed in the Patient Instructions below. There are no Patient Instructions on file for this visit.   Signed, Sinclair Grooms, MD  05/06/2017 2:22 PM    [facility] [address], [place], [place]  [address] Phone: [phone]; Fax: [phone]

## 2017-05-06 ENCOUNTER — Ambulatory Visit (INDEPENDENT_AMBULATORY_CARE_PROVIDER_SITE_OTHER): Payer: BLUE CROSS/BLUE SHIELD | Admitting: Interventional Cardiology

## 2017-05-06 ENCOUNTER — Encounter: Payer: Self-pay | Admitting: Interventional Cardiology

## 2017-05-06 VITALS — BP 126/90 | HR 71 | Ht 68.0 in | Wt 183.4 lb

## 2017-05-06 DIAGNOSIS — E118 Type 2 diabetes mellitus with unspecified complications: Secondary | ICD-10-CM

## 2017-05-06 DIAGNOSIS — E785 Hyperlipidemia, unspecified: Secondary | ICD-10-CM | POA: Diagnosis not present

## 2017-05-06 DIAGNOSIS — I25118 Atherosclerotic heart disease of native coronary artery with other forms of angina pectoris: Secondary | ICD-10-CM | POA: Diagnosis not present

## 2017-05-06 DIAGNOSIS — R0789 Other chest pain: Secondary | ICD-10-CM | POA: Diagnosis not present

## 2017-05-06 DIAGNOSIS — I10 Essential (primary) hypertension: Secondary | ICD-10-CM

## 2017-05-06 MED ORDER — OLMESARTAN MEDOXOMIL 20 MG PO TABS: 20.0000 mg | ORAL_TABLET | Freq: Every day | ORAL | 3 refills | Status: DC

## 2017-05-06 NOTE — Patient Instructions (Signed)
Medication Instructions:  1) DISCONTINUE Irbesartan 2) START Olmesartan 20mg  once daily  Labwork: None  Testing/Procedures Your physician has requested that you have en exercise stress myoview. For further information please visit [email or web address]. Please follow instruction sheet, as given.   Follow-Up: Your physician wants you to follow-up in: 1 year with Dr. [name].  You will receive a reminder letter in the mail two months in advance. If you don't receive a letter, please call our office to schedule the follow-up appointment.   Any Other Special Instructions Will Be Listed Below (If Applicable).     If you need a refill on your cardiac medications before your next appointment, please call your pharmacy.

## 2017-05-21 ENCOUNTER — Inpatient Hospital Stay: Payer: BLUE CROSS/BLUE SHIELD | Attending: Internal Medicine

## 2017-05-21 DIAGNOSIS — C9210 Chronic myeloid leukemia, BCR/ABL-positive, not having achieved remission: Secondary | ICD-10-CM

## 2017-05-21 LAB — CMP (CANCER CENTER ONLY)
ALK PHOS: 71 U/L (ref 40–150)
ALT: 26 U/L (ref 0–55)
AST: 24 U/L (ref 5–34)
Albumin: 3.9 g/dL (ref 3.5–5.0)
Anion gap: 9 (ref 3–11)
BILIRUBIN TOTAL: 0.4 mg/dL (ref 0.2–1.2)
BUN: 12 mg/dL (ref 7–26)
CALCIUM: 9.1 mg/dL (ref 8.4–10.4)
CO2: 24 mmol/L (ref 22–29)
CREATININE: 1.25 mg/dL (ref 0.70–1.30)
Chloride: 108 mmol/L (ref 98–109)
GFR, Est AFR Am: >60 mL/min (ref >60)
Glucose, Bld: 108 mg/dL (ref 70–140)
Potassium: 3.9 mmol/L (ref 3.5–5.1)
Sodium: 141 mmol/L (ref 136–145)
Total Protein: 6.9 g/dL (ref 6.4–8.3)

## 2017-05-21 LAB — CBC WITH DIFFERENTIAL (CANCER CENTER ONLY)
BASOS PCT: 1 %
Basophils Absolute: 0 10*3/uL (ref 0.0–0.1)
EOS ABS: 0.2 10*3/uL (ref 0.0–0.5)
EOS PCT: 3 %
HCT: 40.2 % (ref 38.4–49.9)
HEMOGLOBIN: 13.8 g/dL (ref 13.0–17.1)
Lymphocytes Relative: 38 %
Lymphs Abs: 2.8 10*3/uL (ref 0.9–3.3)
MCH: 31.6 pg (ref 27.2–33.4)
MCHC: 34.3 g/dL (ref 32.0–36.0)
MCV: 92 fL (ref 79.3–98.0)
Monocytes Absolute: 0.8 10*3/uL (ref 0.1–0.9)
Monocytes Relative: 11 %
NEUTROS PCT: 47 %
Neutro Abs: 3.6 10*3/uL (ref 1.5–6.5)
PLATELETS: 207 10*3/uL (ref 140–400)
RBC: 4.37 MIL/uL (ref 4.20–5.82)
RDW: 14.1 % (ref 11.0–14.6)
WBC Count: 7.5 10*3/uL (ref 4.0–10.3)

## 2017-05-21 LAB — LACTATE DEHYDROGENASE: LDH: 218 U/L (ref 125–245)

## 2017-05-24 ENCOUNTER — Other Ambulatory Visit: Payer: Self-pay | Admitting: Interventional Cardiology

## 2017-05-28 ENCOUNTER — Ambulatory Visit: Payer: BLUE CROSS/BLUE SHIELD | Admitting: Internal Medicine

## 2017-05-28 ENCOUNTER — Telehealth: Payer: Self-pay | Admitting: Internal Medicine

## 2017-05-28 NOTE — Telephone Encounter (Signed)
Patient had emergency come up - rescheduled to the next time they were available.

## 2017-06-19 ENCOUNTER — Telehealth (HOSPITAL_COMMUNITY): Payer: Self-pay | Admitting: *Deleted

## 2017-06-19 ENCOUNTER — Telehealth: Payer: Self-pay | Admitting: Internal Medicine

## 2017-06-19 NOTE — Telephone Encounter (Signed)
Left message on voicemail per DPR in reference to upcoming appointment scheduled on 06/25/17 with detailed instructions given per Myocardial Perfusion Study Information Sheet for the test. LM to arrive 15 minutes early, and that it is imperative to arrive on time for appointment to keep from having the test rescheduled. If you need to cancel or reschedule your appointment, please call the office within 24 hours of your appointment. Failure to do so may result in a cancellation of your appointment, and a $50 no show fee. Phone number given for call back for any questions. Kirstie Peri

## 2017-06-19 NOTE — Telephone Encounter (Signed)
Patient called to reschedule he was having  Trouble finding time with work

## 2017-06-20 ENCOUNTER — Inpatient Hospital Stay: Payer: BLUE CROSS/BLUE SHIELD | Admitting: Oncology

## 2017-06-21 ENCOUNTER — Encounter (HOSPITAL_COMMUNITY): Payer: BLUE CROSS/BLUE SHIELD

## 2017-06-25 ENCOUNTER — Encounter (HOSPITAL_COMMUNITY): Payer: BLUE CROSS/BLUE SHIELD

## 2017-07-04 ENCOUNTER — Telehealth: Payer: Self-pay | Admitting: Internal Medicine

## 2017-07-04 NOTE — Telephone Encounter (Signed)
Patient called to reschedule  °

## 2017-07-08 ENCOUNTER — Inpatient Hospital Stay: Payer: BLUE CROSS/BLUE SHIELD

## 2017-07-08 ENCOUNTER — Inpatient Hospital Stay: Payer: BLUE CROSS/BLUE SHIELD | Attending: Internal Medicine | Admitting: Internal Medicine

## 2017-07-08 ENCOUNTER — Telehealth: Payer: Self-pay | Admitting: Internal Medicine

## 2017-07-08 ENCOUNTER — Encounter: Payer: Self-pay | Admitting: Internal Medicine

## 2017-07-08 VITALS — BP 97/71 | HR 79 | Temp 97.6°F | Resp 18 | Ht 68.0 in | Wt 181.7 lb

## 2017-07-08 DIAGNOSIS — C9210 Chronic myeloid leukemia, BCR/ABL-positive, not having achieved remission: Secondary | ICD-10-CM | POA: Diagnosis not present

## 2017-07-08 DIAGNOSIS — Z8546 Personal history of malignant neoplasm of prostate: Secondary | ICD-10-CM | POA: Diagnosis not present

## 2017-07-08 DIAGNOSIS — Z79899 Other long term (current) drug therapy: Secondary | ICD-10-CM | POA: Diagnosis not present

## 2017-07-08 DIAGNOSIS — E785 Hyperlipidemia, unspecified: Secondary | ICD-10-CM | POA: Diagnosis not present

## 2017-07-08 DIAGNOSIS — I2510 Atherosclerotic heart disease of native coronary artery without angina pectoris: Secondary | ICD-10-CM | POA: Diagnosis not present

## 2017-07-08 DIAGNOSIS — I10 Essential (primary) hypertension: Secondary | ICD-10-CM | POA: Diagnosis not present

## 2017-07-08 DIAGNOSIS — Z7983 Long term (current) use of bisphosphonates: Secondary | ICD-10-CM | POA: Diagnosis not present

## 2017-07-08 NOTE — Telephone Encounter (Signed)
Informed by lab that patient could not be found for add on lab. Called patient and per patient he had to leave due to running late for work. Per patient request lab rescheduled for 5/15 at 3pm. Left message informing desk nurse.

## 2017-07-08 NOTE — Telephone Encounter (Signed)
Gave patient avs report and appointments for July. Patient sent back to lab.  °

## 2017-07-08 NOTE — Progress Notes (Signed)
[facility] [phone]   [phone]  OFFICE PROGRESS NOTE  [name], [place] [address] [place] [address]  DIAGNOSIS:  1) Chronic myeloid leukemia diagnosed in November of 2014. 2) History of prostate adenocarcinoma diagnosed in 2006  PRIOR THERAPY:  1) Status post prostatectomy. 2) Gleevec 400 mg by mouth daily, started 02/10/2013, discontinued on 09/13/2016 secondary to disease progression.  CURRENT THERAPY: Tasigna 400 mg by mouth twice a day. First dose started 09/21/2016.  Status post 6 months of treatment.  INTERVAL HISTORY: [name] 62 y.o. male returns to the clinic today for follow-up visit.  The patient has no complaints today except for mild skin rash on the arms and chest.  He denied having any significant chest pain, shortness of breath, cough or hemoptysis.  He denied having any fever or chills.  He has no nausea, vomiting, diarrhea or constipation.  He continues to tolerate his treatment with Tasigna fairly well.  He had molecular studies performed 2 months ago that showed no concerning findings for disease progression.  MEDICAL HISTORY: Past Medical History:  Diagnosis Date  . Arthritis   . Bladder disorder   . Cancer ([place])    hx prostate cancer and CML  . Chronic myelocytic leukemia ([facility])   . Coronary artery disease    with prior MI 2002(BMS) and Cfx(2006) and RCA (2004) stents . EF 45-50%  . Coronary atherosclerosis of native coronary artery    stable and suspect a component of CAS  . Diverticulosis    seen on colonoscopy in 2008  . Goals of care, counseling/discussion 09/13/2016  . H/O colonoscopy 2014  . Hyperlipidemia   . Hypertension   . Myocardial infarction ([facility]) 02,6/12  . Prostate cancer ([facility])     ALLERGIES:  is allergic to contrast media [iodinated diagnostic agents].  MEDICATIONS:  Current Outpatient Medications  Medication Sig Dispense Refill  . Ascorbic Acid (VITAMIN  C) 1000 MG tablet Take 1,000 mg by mouth daily.    Marland Kitchen aspirin 81 MG chewable tablet Chew 162 mg by mouth daily.     . cholecalciferol (VITAMIN D) 1000 UNITS tablet Take 5,000 Units by mouth daily.     . Coenzyme Q10 (COQ10) 100 MG CAPS Take 100 mg by mouth daily.    . cyclobenzaprine (FLEXERIL) 10 MG tablet Take by mouth.    . darifenacin (ENABLEX) 15 MG 24 hr tablet Take 15 mg by mouth daily.     Marland Kitchen dexlansoprazole (DEXILANT) 60 MG capsule Take 60 mg by mouth daily.    . hydrOXYzine (ATARAX/VISTARIL) 25 MG tablet Take 50 mg by mouth daily.    . isosorbide mononitrate (IMDUR) 60 MG 24 hr tablet TAKE 1 TABLET BY MOUTH ONCE DAILY 90 tablet 3  . magnesium oxide (MAG-OX) 400 MG tablet Take 400 mg by mouth daily as needed (cramps).    . meloxicam (MOBIC) 15 MG tablet   2  . metoprolol succinate (TOPROL-XL) 100 MG 24 hr tablet TAKE 1 TABLET BY MOUTH WITH MEALS OR IMMEDIATELY FOLLOWING 90 tablet 3  . nilotinib (TASIGNA) 200 MG capsule TAKE 2 CAPSULES (400MG ) BY MOUTH EVERY 12 HOURS ON AN EMPTY STOMACH 1 HOUR BEFORE OR 2 HOURS AFTER A MEAL 112 capsule PRN  . nitroGLYCERIN (NITROLINGUAL) 0.4 MG/SPRAY spray Place 1 spray under the tongue every 5 (five) minutes x 3 doses as needed for chest pain. 12 g 2  . olmesartan (BENICAR) 20 MG tablet Take 1  tablet (20 mg total) by mouth daily. 90 tablet 3  . PROAIR HFA 108 (90 Base) MCG/ACT inhaler Inhale 2 puffs into the lungs every 6 (six) hours as needed for wheezing (cough).   0  . rosuvastatin (CRESTOR) 20 MG tablet TAKE 1 TABLET BY MOUTH ONCE DAILY 90 tablet 3  . valsartan (DIOVAN) 40 MG tablet Take by mouth.    Marland Kitchen acetaminophen-codeine 120-12 MG/5ML suspension Take 5 mLs by mouth every 8 (eight) hours as needed (cough).    . benzonatate (TESSALON) 200 MG capsule TAKE 1 CAPSULE BY MOUTH TWICE DAILY AS NEEDED FOR COUGH (Patient not taking: Reported on 07/08/2017) 20 capsule 0   No current facility-administered medications for this visit.     SURGICAL HISTORY:    Past Surgical History:  Procedure Laterality Date  . CARDIAC CATHETERIZATION     normal EF, patent stents without obstructive disease (on Plavix X 1 month only)  . CAROTID STENT    . FRACTURE SURGERY     rt wrist  . LEFT HEART CATHETERIZATION WITH CORONARY ANGIOGRAM N/A 02/04/2012   Procedure: LEFT HEART CATHETERIZATION WITH CORONARY ANGIOGRAM;  Surgeon: Sinclair Grooms, MD;  Location: [facility] CATH LAB;  Service: Cardiovascular;  Laterality: N/A;  . ORIF ANKLE FRACTURE  07/04/2011   Procedure: OPEN REDUCTION INTERNAL FIXATION (ORIF) ANKLE FRACTURE;  Surgeon: Alta Corning, MD;  Location: [facility];  Service: Orthopedics;  Laterality: Right;  . TOE SURGERY     rt great toe  . TONSILLECTOMY    . WRIST SURGERY     rt and lt cysts removed    REVIEW OF SYSTEMS:  A comprehensive review of systems was negative.   PHYSICAL EXAMINATION: General appearance: alert, cooperative and no distress Head: Normocephalic, without obvious abnormality, atraumatic Neck: no adenopathy, no JVD, supple, symmetrical, trachea midline and thyroid not enlarged, symmetric, no tenderness/mass/nodules Lymph nodes: Cervical, supraclavicular, and axillary nodes normal. Resp: clear to auscultation bilaterally Back: symmetric, no curvature. ROM normal. No CVA tenderness. Cardio: regular rate and rhythm, S1, S2 normal, no murmur, click, rub or gallop GI: soft, non-tender; bowel sounds normal; no masses,  no organomegaly Extremities: extremities normal, atraumatic, no cyanosis or edema    ECOG PERFORMANCE STATUS: 1 - Symptomatic but completely ambulatory  Blood pressure 97/71, pulse 79, temperature 97.6 F (36.4 C), temperature source Oral, resp. rate 18, height 5\' 8"  (1.727 m), weight 181 lb 11.2 oz (82.4 kg), SpO2 100 %.  LABORATORY DATA: Lab Results  Component Value Date   WBC 7.5 05/21/2017   HGB 13.8 05/21/2017   HCT 40.2 05/21/2017   MCV 92.0 05/21/2017   PLT 207 05/21/2017      Chemistry       Component Value Date/Time   NA 141 05/21/2017 1545   NA 140 03/01/2017 1117   K 3.9 05/21/2017 1545   K 4.3 03/01/2017 1117   CL 108 05/21/2017 1545   CO2 24 05/21/2017 1545   CO2 24 03/01/2017 1117   BUN 12 05/21/2017 1545   BUN 14.5 03/01/2017 1117   CREATININE 1.25 05/21/2017 1545   CREATININE 1.3 03/01/2017 1117      Component Value Date/Time   CALCIUM 9.1 05/21/2017 1545   CALCIUM 9.2 03/01/2017 1117   ALKPHOS 71 05/21/2017 1545   ALKPHOS 68 03/01/2017 1117   AST 24 05/21/2017 1545   AST 20 03/01/2017 1117   ALT 26 05/21/2017 1545   ALT 22 03/01/2017 1117   BILITOT 0.4 05/21/2017 1545  BILITOT 0.55 03/01/2017 1117       RADIOGRAPHIC STUDIES: No results found.  ASSESSMENT AND PLAN:  This is a very pleasant 62 years old African-American male with chronic myeloid leukemia diagnosed in November 2014 and has been on treatment with Gleevec 400 mg by mouth daily since that time and has been tolerating the treatment well. The patient had evidence for molecular relapse 2 months ago and he was started on treatment with Tasigna 400 mg by mouth twice a day status post 10 months. He continues to tolerate this treatment well with no concerning complaints. His molecular studies were unremarkable for disease progression.  I recommended for the patient to continue his current treatment with Tasigna. He will have repeat blood work today and also in 2 months with the upcoming follow-up visit. The patient was advised to call immediately if he has any concerning symptoms in the interval. The patient voices understanding of current disease status and treatment options and is in agreement with the current care plan. All questions were answered. The patient knows to call the clinic with any problems, questions or concerns. We can certainly see the patient much sooner if necessary. I spent 10 minutes counseling the patient face to face. The total time spent in the appointment was 15  minutes. Disclaimer: This note was dictated with voice recognition software. Similar sounding words can inadvertently be transcribed and may not be corrected upon review.

## 2017-07-10 ENCOUNTER — Ambulatory Visit: Payer: BLUE CROSS/BLUE SHIELD | Admitting: Internal Medicine

## 2017-07-11 LAB — BCR/ABL

## 2017-07-17 ENCOUNTER — Inpatient Hospital Stay: Payer: BLUE CROSS/BLUE SHIELD

## 2017-07-17 DIAGNOSIS — C9210 Chronic myeloid leukemia, BCR/ABL-positive, not having achieved remission: Secondary | ICD-10-CM

## 2017-07-17 LAB — COMPREHENSIVE METABOLIC PANEL
ALT: 55 U/L (ref 0–55)
ANION GAP: 5 (ref 3–11)
AST: 25 U/L (ref 5–34)
Albumin: 4 g/dL (ref 3.5–5.0)
Alkaline Phosphatase: 113 U/L (ref 40–150)
BUN: 12 mg/dL (ref 7–26)
CALCIUM: 9.6 mg/dL (ref 8.4–10.4)
CHLORIDE: 109 mmol/L (ref 98–109)
CO2: 27 mmol/L (ref 22–29)
Creatinine, Ser: 1.22 mg/dL (ref 0.70–1.30)
GFR calc non Af Amer: >60 mL/min (ref >60)
Glucose, Bld: 108 mg/dL (ref 70–140)
Potassium: 4 mmol/L (ref 3.5–5.1)
SODIUM: 141 mmol/L (ref 136–145)
Total Bilirubin: 0.4 mg/dL (ref 0.2–1.2)
Total Protein: 6.9 g/dL (ref 6.4–8.3)

## 2017-07-17 LAB — CBC WITH DIFFERENTIAL/PLATELET
BASOS ABS: 0.1 10*3/uL (ref 0.0–0.1)
BASOS PCT: 1 %
Eosinophils Absolute: 0.1 10*3/uL (ref 0.0–0.5)
Eosinophils Relative: 2 %
HEMATOCRIT: 40.6 % (ref 38.4–49.9)
HEMOGLOBIN: 13.7 g/dL (ref 13.0–17.1)
Lymphocytes Relative: 34 %
Lymphs Abs: 2.3 10*3/uL (ref 0.9–3.3)
MCH: 31.5 pg (ref 27.2–33.4)
MCHC: 33.7 g/dL (ref 32.0–36.0)
MCV: 93.5 fL (ref 79.3–98.0)
Monocytes Absolute: 0.8 10*3/uL (ref 0.1–0.9)
Monocytes Relative: 12 %
NEUTROS ABS: 3.5 10*3/uL (ref 1.5–6.5)
NEUTROS PCT: 51 %
Platelets: 269 10*3/uL (ref 140–400)
RBC: 4.35 MIL/uL (ref 4.20–5.82)
RDW: 14.6 % (ref 11.0–14.6)
WBC: 6.8 10*3/uL (ref 4.0–10.3)

## 2017-07-17 LAB — LACTATE DEHYDROGENASE: LDH: 203 U/L (ref 125–245)

## 2017-07-18 ENCOUNTER — Telehealth (HOSPITAL_COMMUNITY): Payer: Self-pay | Admitting: *Deleted

## 2017-07-18 NOTE — Telephone Encounter (Signed)
Left message on voicemail per DPR in reference to upcoming appointment scheduled on 07/22/17 with detailed instructions given per Myocardial Perfusion Study Information Sheet for the test. LM to arrive 15 minutes early, and that it is imperative to arrive on time for appointment to keep from having the test rescheduled. If you need to cancel or reschedule your appointment, please call the office within 24 hours of your appointment. Failure to do so may result in a cancellation of your appointment, and a $50 no show fee. Phone number given for call back for any questions. Kirstie Peri

## 2017-07-22 ENCOUNTER — Ambulatory Visit (HOSPITAL_COMMUNITY): Payer: BLUE CROSS/BLUE SHIELD | Attending: Cardiology

## 2017-07-22 DIAGNOSIS — I2510 Atherosclerotic heart disease of native coronary artery without angina pectoris: Secondary | ICD-10-CM | POA: Insufficient documentation

## 2017-07-22 DIAGNOSIS — R0789 Other chest pain: Secondary | ICD-10-CM | POA: Insufficient documentation

## 2017-07-22 DIAGNOSIS — R9439 Abnormal result of other cardiovascular function study: Secondary | ICD-10-CM | POA: Insufficient documentation

## 2017-07-22 DIAGNOSIS — I10 Essential (primary) hypertension: Secondary | ICD-10-CM | POA: Insufficient documentation

## 2017-07-22 LAB — MYOCARDIAL PERFUSION IMAGING
CHL CUP RESTING HR STRESS: 63 {beats}/min
CSEPPHR: 123 {beats}/min
LVDIAVOL: 133 mL (ref 62–150)
LVSYSVOL: 77 mL
RATE: 0.33
SDS: 1
SRS: 8
SSS: 9
TID: 0.95

## 2017-07-22 MED ORDER — REGADENOSON 0.4 MG/5ML IV SOLN
0.4000 mg | Freq: Once | INTRAVENOUS | Status: AC
  Administered 2017-07-22: 0.4 mg via INTRAVENOUS

## 2017-07-22 MED ORDER — TECHNETIUM TC 99M TETROFOSMIN IV KIT
10.5000 | PACK | Freq: Once | INTRAVENOUS | Status: AC | PRN
  Administered 2017-07-22: 10.5 via INTRAVENOUS
  Filled 2017-07-22: qty 11

## 2017-07-22 MED ORDER — TECHNETIUM TC 99M TETROFOSMIN IV KIT
31.7000 | PACK | Freq: Once | INTRAVENOUS | Status: AC | PRN
  Administered 2017-07-22: 31.7 via INTRAVENOUS
  Filled 2017-07-22: qty 32

## 2017-09-03 ENCOUNTER — Inpatient Hospital Stay: Payer: BLUE CROSS/BLUE SHIELD

## 2017-09-03 ENCOUNTER — Inpatient Hospital Stay: Payer: BLUE CROSS/BLUE SHIELD | Admitting: Internal Medicine

## 2017-09-30 ENCOUNTER — Inpatient Hospital Stay: Payer: BLUE CROSS/BLUE SHIELD

## 2017-09-30 ENCOUNTER — Inpatient Hospital Stay: Payer: BLUE CROSS/BLUE SHIELD | Admitting: Internal Medicine

## 2017-11-05 ENCOUNTER — Inpatient Hospital Stay: Payer: BLUE CROSS/BLUE SHIELD | Attending: Internal Medicine

## 2017-11-05 ENCOUNTER — Inpatient Hospital Stay (HOSPITAL_BASED_OUTPATIENT_CLINIC_OR_DEPARTMENT_OTHER): Payer: BLUE CROSS/BLUE SHIELD | Admitting: Oncology

## 2017-11-05 ENCOUNTER — Telehealth: Payer: Self-pay | Admitting: Oncology

## 2017-11-05 ENCOUNTER — Encounter: Payer: Self-pay | Admitting: Oncology

## 2017-11-05 VITALS — BP 132/93 | HR 74 | Temp 98.1°F | Resp 17 | Ht 68.0 in | Wt 189.0 lb

## 2017-11-05 DIAGNOSIS — I252 Old myocardial infarction: Secondary | ICD-10-CM | POA: Diagnosis not present

## 2017-11-05 DIAGNOSIS — Z7982 Long term (current) use of aspirin: Secondary | ICD-10-CM | POA: Insufficient documentation

## 2017-11-05 DIAGNOSIS — Z79899 Other long term (current) drug therapy: Secondary | ICD-10-CM

## 2017-11-05 DIAGNOSIS — Z9079 Acquired absence of other genital organ(s): Secondary | ICD-10-CM | POA: Insufficient documentation

## 2017-11-05 DIAGNOSIS — Z8546 Personal history of malignant neoplasm of prostate: Secondary | ICD-10-CM | POA: Diagnosis not present

## 2017-11-05 DIAGNOSIS — M1990 Unspecified osteoarthritis, unspecified site: Secondary | ICD-10-CM | POA: Insufficient documentation

## 2017-11-05 DIAGNOSIS — C9210 Chronic myeloid leukemia, BCR/ABL-positive, not having achieved remission: Secondary | ICD-10-CM | POA: Diagnosis not present

## 2017-11-05 DIAGNOSIS — Z5111 Encounter for antineoplastic chemotherapy: Secondary | ICD-10-CM

## 2017-11-05 DIAGNOSIS — E785 Hyperlipidemia, unspecified: Secondary | ICD-10-CM | POA: Diagnosis not present

## 2017-11-05 DIAGNOSIS — I10 Essential (primary) hypertension: Secondary | ICD-10-CM | POA: Insufficient documentation

## 2017-11-05 DIAGNOSIS — I2510 Atherosclerotic heart disease of native coronary artery without angina pectoris: Secondary | ICD-10-CM | POA: Diagnosis not present

## 2017-11-05 LAB — CBC WITH DIFFERENTIAL (CANCER CENTER ONLY)
Basophils Absolute: 0.1 10*3/uL (ref 0.0–0.1)
Basophils Relative: 1 %
EOS PCT: 3 %
Eosinophils Absolute: 0.2 10*3/uL (ref 0.0–0.5)
HCT: 41.5 % (ref 38.4–49.9)
Hemoglobin: 14.4 g/dL (ref 13.0–17.1)
LYMPHS ABS: 2.6 10*3/uL (ref 0.9–3.3)
LYMPHS PCT: 36 %
MCH: 31.9 pg (ref 27.2–33.4)
MCHC: 34.7 g/dL (ref 32.0–36.0)
MCV: 92 fL (ref 79.3–98.0)
MONOS PCT: 11 %
Monocytes Absolute: 0.8 10*3/uL (ref 0.1–0.9)
Neutro Abs: 3.6 10*3/uL (ref 1.5–6.5)
Neutrophils Relative %: 49 %
PLATELETS: 218 10*3/uL (ref 140–400)
RBC: 4.51 MIL/uL (ref 4.20–5.82)
RDW: 14.2 % (ref 11.0–14.6)
WBC: 7.2 10*3/uL (ref 4.0–10.3)

## 2017-11-05 LAB — CMP (CANCER CENTER ONLY)
ALBUMIN: 3.9 g/dL (ref 3.5–5.0)
ALT: 18 U/L (ref 0–44)
AST: 15 U/L (ref 15–41)
Alkaline Phosphatase: 62 U/L (ref 38–126)
Anion gap: 7 (ref 5–15)
BUN: 11 mg/dL (ref 6–20)
CHLORIDE: 111 mmol/L (ref 98–111)
CO2: 25 mmol/L (ref 22–32)
Calcium: 9.2 mg/dL (ref 8.9–10.3)
Creatinine: 1.39 mg/dL — ABNORMAL HIGH (ref 0.61–1.24)
GFR, EST NON AFRICAN AMERICAN: 54 mL/min — AB (ref >60)
GFR, Est AFR Am: >60 mL/min (ref >60)
GLUCOSE: 114 mg/dL — AB (ref 70–99)
POTASSIUM: 4.1 mmol/L (ref 3.5–5.1)
SODIUM: 143 mmol/L (ref 135–145)
Total Bilirubin: 0.4 mg/dL (ref 0.3–1.2)
Total Protein: 6.8 g/dL (ref 6.5–8.1)

## 2017-11-05 LAB — LACTATE DEHYDROGENASE: LDH: 180 U/L (ref 98–192)

## 2017-11-05 NOTE — Telephone Encounter (Signed)
Scheduled appt per 9/3 los -gave patient AVS and calender per los.  

## 2017-11-05 NOTE — Progress Notes (Signed)
[facility] OFFICE PROGRESS NOTE  [name], [address] [place] [address]  DIAGNOSIS:  1) Chronic myeloid leukemia diagnosed in November of 2014. 2) History of prostate adenocarcinoma diagnosed in 2006  PRIOR THERAPY:  1) Status post prostatectomy. 2) Gleevec 400 mg by mouth daily, started 02/10/2013, discontinued on 09/13/2016 secondary to disease progression.  CURRENT THERAPY: Tasigna 400 mg by mouth twice a day. First dose started 09/21/2016.  Status post 12 months of treatment.  INTERVAL HISTORY: [name] 61 y.o. male returns for routine follow-up visit by himself.  The patient is feeling fine today and has no specific complaints except for intermittent rash to his arms and chest.  This comes and goes and he uses hydrocortisone on as-needed basis.  His rash has not worsened.  The patient denies fevers and chills.  Denies chest pain, shortness of breath, cough, hemoptysis.  Denies nausea, vomiting, constipation, diarrhea.  He continues to tolerate treatment with Tasigna fairly well.  The patient is here for evaluation and repeat lab work.  MEDICAL HISTORY: Past Medical History:  Diagnosis Date  . Arthritis   . Bladder disorder   . Cancer ([facility])    hx prostate cancer and CML  . Chronic myelocytic leukemia ([facility])   . Coronary artery disease    with prior MI 2002(BMS) and Cfx(2006) and RCA (2004) stents . EF 45-50%  . Coronary atherosclerosis of native coronary artery    stable and suspect a component of CAS  . Diverticulosis    seen on colonoscopy in 2008  . Goals of care, counseling/discussion 09/13/2016  . H/O colonoscopy 2014  . Hyperlipidemia   . Hypertension   . Myocardial infarction ([facility]) 02,6/12  . Prostate cancer ([facility])     ALLERGIES:  is allergic to contrast media [iodinated diagnostic agents].  MEDICATIONS:  Current Outpatient Medications  Medication Sig Dispense Refill  . acetaminophen-codeine 120-12 MG/5ML  suspension Take 5 mLs by mouth every 8 (eight) hours as needed (cough).    . Ascorbic Acid (VITAMIN C) 1000 MG tablet Take 1,000 mg by mouth daily.    Marland Kitchen aspirin 81 MG chewable tablet Chew 162 mg by mouth daily.     . benzonatate (TESSALON) 200 MG capsule TAKE 1 CAPSULE BY MOUTH TWICE DAILY AS NEEDED FOR COUGH (Patient not taking: Reported on 07/08/2017) 20 capsule 0  . cholecalciferol (VITAMIN D) 1000 UNITS tablet Take 5,000 Units by mouth daily.     . Coenzyme Q10 (COQ10) 100 MG CAPS Take 100 mg by mouth daily.    . cyclobenzaprine (FLEXERIL) 10 MG tablet Take by mouth.    . darifenacin (ENABLEX) 15 MG 24 hr tablet Take 15 mg by mouth daily.     Marland Kitchen dexlansoprazole (DEXILANT) 60 MG capsule Take 60 mg by mouth daily.    . hydrOXYzine (ATARAX/VISTARIL) 25 MG tablet Take 50 mg by mouth daily.    . isosorbide mononitrate (IMDUR) 60 MG 24 hr tablet TAKE 1 TABLET BY MOUTH ONCE DAILY 90 tablet 3  . magnesium oxide (MAG-OX) 400 MG tablet Take 400 mg by mouth daily as needed (cramps).    . meloxicam (MOBIC) 15 MG tablet   2  . metoprolol succinate (TOPROL-XL) 100 MG 24 hr tablet TAKE 1 TABLET BY MOUTH WITH MEALS OR IMMEDIATELY FOLLOWING 90 tablet 3  . nilotinib (TASIGNA) 200 MG capsule TAKE 2 CAPSULES (400MG ) BY MOUTH EVERY 12 HOURS ON AN EMPTY STOMACH 1 HOUR BEFORE OR 2 HOURS AFTER A  MEAL 112 capsule PRN  . nitroGLYCERIN (NITROLINGUAL) 0.4 MG/SPRAY spray Place 1 spray under the tongue every 5 (five) minutes x 3 doses as needed for chest pain. 12 g 2  . olmesartan (BENICAR) 20 MG tablet Take 1 tablet (20 mg total) by mouth daily. 90 tablet 3  . PROAIR HFA 108 (90 Base) MCG/ACT inhaler Inhale 2 puffs into the lungs every 6 (six) hours as needed for wheezing (cough).   0  . rosuvastatin (CRESTOR) 20 MG tablet TAKE 1 TABLET BY MOUTH ONCE DAILY 90 tablet 3  . valsartan (DIOVAN) 40 MG tablet Take by mouth.     No current facility-administered medications for this visit.     SURGICAL HISTORY:  Past Surgical  History:  Procedure Laterality Date  . CARDIAC CATHETERIZATION     normal EF, patent stents without obstructive disease (on Plavix X 1 month only)  . CAROTID STENT    . FRACTURE SURGERY     rt wrist  . LEFT HEART CATHETERIZATION WITH CORONARY ANGIOGRAM N/A 02/04/2012   Procedure: LEFT HEART CATHETERIZATION WITH CORONARY ANGIOGRAM;  Surgeon: Sinclair Grooms, MD;  Location: [facility] CATH LAB;  Service: Cardiovascular;  Laterality: N/A;  . ORIF ANKLE FRACTURE  07/04/2011   Procedure: OPEN REDUCTION INTERNAL FIXATION (ORIF) ANKLE FRACTURE;  Surgeon: Alta Corning, MD;  Location: [facility];  Service: Orthopedics;  Laterality: Right;  . TOE SURGERY     rt great toe  . TONSILLECTOMY    . WRIST SURGERY     rt and lt cysts removed    REVIEW OF SYSTEMS:   Review of Systems  Constitutional: Negative for appetite change, chills, fatigue, fever and unexpected weight change.  HENT:   Negative for mouth sores, nosebleeds, sore throat and trouble swallowing.   Eyes: Negative for eye problems and icterus.  Respiratory: Negative for cough, hemoptysis, shortness of breath and wheezing.   Cardiovascular: Negative for chest pain and leg swelling.  Gastrointestinal: Negative for abdominal pain, constipation, diarrhea, nausea and vomiting.  Genitourinary: Negative for bladder incontinence, difficulty urinating, dysuria, frequency and hematuria.   Musculoskeletal: Negative for back pain, gait problem, neck pain and neck stiffness.  Skin: Positive for intermittent itching and rash.  Neurological: Negative for dizziness, extremity weakness, gait problem, headaches, light-headedness and seizures.  Hematological: Negative for adenopathy. Does not bruise/bleed easily.  Psychiatric/Behavioral: Negative for confusion, depression and sleep disturbance. The patient is not nervous/anxious.     PHYSICAL EXAMINATION:  Blood pressure (!) 132/93, pulse 74, temperature 98.1 F (36.7 C), temperature source  Oral, resp. rate 17, height 5\' 8"  (1.727 m), weight 189 lb (85.7 kg), SpO2 99 %.  ECOG PERFORMANCE STATUS: 1 - Symptomatic but completely ambulatory  Physical Exam  Constitutional: Oriented to person, place, and time and well-developed, well-nourished, and in no distress. No distress.  HENT:  Head: Normocephalic and atraumatic.  Mouth/Throat: Oropharynx is clear and moist. No oropharyngeal exudate.  Eyes: Conjunctivae are normal. Right eye exhibits no discharge. Left eye exhibits no discharge. No scleral icterus.  Neck: Normal range of motion. Neck supple.  Cardiovascular: Normal rate, regular rhythm, normal heart sounds and intact distal pulses.   Pulmonary/Chest: Effort normal and breath sounds normal. No respiratory distress. No wheezes. No rales.  Abdominal: Soft. Bowel sounds are normal. Exhibits no distension and no mass. There is no tenderness.  Musculoskeletal: Normal range of motion. Exhibits no edema.  Lymphadenopathy:    No cervical adenopathy.  Neurological: Alert and oriented to person, place,  and time. Exhibits normal muscle tone. Gait normal. Coordination normal.  Skin: Skin is warm and dry. No rash noted. Not diaphoretic. No erythema. No pallor.  Psychiatric: Mood, memory and judgment normal.  Vitals reviewed.  LABORATORY DATA: Lab Results  Component Value Date   WBC 7.2 11/05/2017   HGB 14.4 11/05/2017   HCT 41.5 11/05/2017   MCV 92.0 11/05/2017   PLT 218 11/05/2017      Chemistry      Component Value Date/Time   NA 143 11/05/2017 1440   NA 140 03/01/2017 1117   K 4.1 11/05/2017 1440   K 4.3 03/01/2017 1117   CL 111 11/05/2017 1440   CO2 25 11/05/2017 1440   CO2 24 03/01/2017 1117   BUN 11 11/05/2017 1440   BUN 14.5 03/01/2017 1117   CREATININE 1.39 (H) 11/05/2017 1440   CREATININE 1.3 03/01/2017 1117      Component Value Date/Time   CALCIUM 9.2 11/05/2017 1440   CALCIUM 9.2 03/01/2017 1117   ALKPHOS 62 11/05/2017 1440   ALKPHOS 68 03/01/2017 1117    AST 15 11/05/2017 1440   AST 20 03/01/2017 1117   ALT 18 11/05/2017 1440   ALT 22 03/01/2017 1117   BILITOT 0.4 11/05/2017 1440   BILITOT 0.55 03/01/2017 1117       RADIOGRAPHIC STUDIES:  No results found.   ASSESSMENT/PLAN:  Chronic myeloid leukemia This is a very pleasant 62 year old African-American male with chronic myeloid leukemia diagnosed in November 2014 and has been on treatment with Gleevec 400 mg by mouth daily since that time and has been tolerating the treatment well. The patient had evidence for molecular relapse 2 months ago and he was started on treatment with Tasigna 400 mg by mouth twice a day status post 12 months. He continues to tolerate this treatment well with no concerning complaints. His molecular studies in March 2019 were unremarkable for disease progression. His CBC from today is normal.  I recommended for the patient to continue his current treatment with Tasigna. The patient will follow-up in 2 months with repeat CBC, CMET, LDH, and BCR/abl-PCR.  The patient was advised to call immediately if he has any concerning symptoms in the interval. The patient voices understanding of current disease status and treatment options and is in agreement with the current care plan. All questions were answered. The patient knows to call the clinic with any problems, questions or concerns. We can certainly see the patient much sooner if necessary.   Orders Placed This Encounter  Procedures  . CBC with Differential (Cancer Center Only)    Standing Status:   Future    Standing Expiration Date:   11/06/2018  . CMP ([facility] only)    Standing Status:   Future    Standing Expiration Date:   11/06/2018  . Lactate dehydrogenase    Standing Status:   Future    Standing Expiration Date:   11/06/2018  . bcr/abl-PCR    Standing Status:   Future    Standing Expiration Date:   11/06/2018     Mikey Bussing, DNP, AGPCNP-BC, AOCNP 11/05/17

## 2017-11-05 NOTE — Assessment & Plan Note (Addendum)
This is a very pleasant 62 year old African-American male with chronic myeloid leukemia diagnosed in November 2014 and has been on treatment with Gleevec 400 mg by mouth daily since that time and has been tolerating the treatment well. The patient had evidence for molecular relapse 2 months ago and he was started on treatment with Tasigna 400 mg by mouth twice a day status post 12 months. He continues to tolerate this treatment well with no concerning complaints. His molecular studies in March 2019 were unremarkable for disease progression. His CBC from today is normal.  I recommended for the patient to continue his current treatment with Tasigna. The patient will follow-up in 2 months with repeat CBC, CMET, LDH, and BCR/abl-PCR.  The patient was advised to call immediately if he has any concerning symptoms in the interval. The patient voices understanding of current disease status and treatment options and is in agreement with the current care plan. All questions were answered. The patient knows to call the clinic with any problems, questions or concerns. We can certainly see the patient much sooner if necessary.

## 2017-12-01 ENCOUNTER — Encounter: Payer: Self-pay | Admitting: Internal Medicine

## 2017-12-01 DIAGNOSIS — I1310 Hypertensive heart and chronic kidney disease without heart failure, with stage 1 through stage 4 chronic kidney disease, or unspecified chronic kidney disease: Secondary | ICD-10-CM | POA: Insufficient documentation

## 2017-12-05 ENCOUNTER — Other Ambulatory Visit: Payer: Self-pay

## 2017-12-05 MED ORDER — HYDROXYZINE HCL 25 MG PO TABS: 25.0000 mg | ORAL_TABLET | Freq: Two times a day (BID) | ORAL | 0 refills | Status: AC

## 2017-12-19 ENCOUNTER — Other Ambulatory Visit: Payer: Self-pay

## 2017-12-19 ENCOUNTER — Ambulatory Visit: Payer: BLUE CROSS/BLUE SHIELD | Admitting: Internal Medicine

## 2017-12-19 ENCOUNTER — Encounter: Payer: Self-pay | Admitting: Internal Medicine

## 2017-12-19 VITALS — BP 114/70 | HR 83 | Temp 98.2°F | Ht 66.0 in | Wt 188.0 lb

## 2017-12-19 DIAGNOSIS — M25542 Pain in joints of left hand: Secondary | ICD-10-CM

## 2017-12-19 DIAGNOSIS — Z7982 Long term (current) use of aspirin: Secondary | ICD-10-CM

## 2017-12-19 DIAGNOSIS — I25118 Atherosclerotic heart disease of native coronary artery with other forms of angina pectoris: Secondary | ICD-10-CM | POA: Diagnosis not present

## 2017-12-19 DIAGNOSIS — R7309 Other abnormal glucose: Secondary | ICD-10-CM

## 2017-12-19 DIAGNOSIS — Z79899 Other long term (current) drug therapy: Secondary | ICD-10-CM

## 2017-12-19 DIAGNOSIS — I1310 Hypertensive heart and chronic kidney disease without heart failure, with stage 1 through stage 4 chronic kidney disease, or unspecified chronic kidney disease: Secondary | ICD-10-CM

## 2017-12-19 DIAGNOSIS — M25541 Pain in joints of right hand: Secondary | ICD-10-CM

## 2017-12-19 DIAGNOSIS — N182 Chronic kidney disease, stage 2 (mild): Secondary | ICD-10-CM

## 2017-12-19 DIAGNOSIS — Z5181 Encounter for therapeutic drug level monitoring: Secondary | ICD-10-CM

## 2017-12-19 NOTE — Progress Notes (Signed)
Subjective:     Patient ID: [name] , male    DOB: 1955/08/27 , 62 y.o.   MRN: [id number]   Chief Complaint  Patient presents with  . Hypertension    HPI  Hypertension  This is a chronic problem. The current episode started more than 1 year ago. The problem is controlled. Risk factors for coronary artery disease include male gender, obesity and sedentary lifestyle. Identifiable causes of hypertension include chronic renal disease.     Past Medical History:  Diagnosis Date  . Arthritis   . Bladder disorder   . Cancer ([place])    hx prostate cancer and CML  . Chronic myelocytic leukemia ([place])   . Coronary artery disease    with prior MI 2002([facility]) and Cfx(2006) and RCA (2004) stents . EF 45-50%  . Coronary atherosclerosis of native coronary artery    stable and suspect a component of CAS  . Diverticulosis    seen on colonoscopy in 2008  . Goals of care, counseling/discussion 09/13/2016  . H/O colonoscopy 2014  . Hyperlipidemia   . Hypertension   . Myocardial infarction ([place]) 02,6/12  . Prostate cancer [facility])      Family History  Problem Relation Age of Onset  . Cancer Mother        Liver    Current Outpatient Medications:  .  Ascorbic Acid (VITAMIN C) 1000 MG tablet, Take 1,000 mg by mouth daily., Disp: , Rfl:  .  aspirin 81 MG chewable tablet, Chew 162 mg by mouth daily. , Disp: , Rfl:  .  cholecalciferol (VITAMIN D) 1000 UNITS tablet, Take 5,000 Units by mouth daily. , Disp: , Rfl:  .  Coenzyme Q10 (COQ10) 100 MG CAPS, Take 100 mg by mouth daily., Disp: , Rfl:  .  cyclobenzaprine (FLEXERIL) 10 MG tablet, Take by mouth., Disp: , Rfl:  .  darifenacin (ENABLEX) 15 MG 24 hr tablet, Take 15 mg by mouth daily. , Disp: , Rfl:  .  dexlansoprazole (DEXILANT) 60 MG capsule, Take 60 mg by mouth daily., Disp: , Rfl:  .  hydrOXYzine (ATARAX/VISTARIL) 25 MG tablet, Take 1 tablet (25 mg total) by mouth 2 (two) times daily., Disp: 90 tablet, Rfl: 0 .  isosorbide mononitrate  (IMDUR) 60 MG 24 hr tablet, TAKE 1 TABLET BY MOUTH ONCE DAILY, Disp: 90 tablet, Rfl: 3 .  magnesium oxide (MAG-OX) 400 MG tablet, Take 400 mg by mouth daily as needed (cramps)., Disp: , Rfl:  .  metoprolol succinate (TOPROL-XL) 100 MG 24 hr tablet, TAKE 1 TABLET BY MOUTH WITH MEALS OR IMMEDIATELY FOLLOWING, Disp: 90 tablet, Rfl: 3 .  nilotinib (TASIGNA) 200 MG capsule, TAKE 2 CAPSULES (400MG) BY MOUTH EVERY 12 HOURS ON AN EMPTY STOMACH 1 HOUR BEFORE OR 2 HOURS AFTER A MEAL, Disp: 112 capsule, Rfl: PRN .  nitroGLYCERIN (NITROLINGUAL) 0.4 MG/SPRAY spray, Place 1 spray under the tongue every 5 (five) minutes x 3 doses as needed for chest pain., Disp: 12 g, Rfl: 2 .  olmesartan (BENICAR) 20 MG tablet, Take 1 tablet (20 mg total) by mouth daily., Disp: 90 tablet, Rfl: 3 .  PROAIR HFA 108 (90 Base) MCG/ACT inhaler, Inhale 2 puffs into the lungs every 6 (six) hours as needed for wheezing (cough). , Disp: , Rfl: 0 .  rosuvastatin (CRESTOR) 20 MG tablet, TAKE 1 TABLET BY MOUTH ONCE DAILY, Disp: 90 tablet, Rfl: 3   Allergies  Allergen Reactions  . Contrast Media [Iodinated Diagnostic Agents] Other (See Comments)  Reaction unknown     Review of Systems  Constitutional: Negative.   Eyes: Negative.   Respiratory: Negative.   Cardiovascular: Negative.   Musculoskeletal: Positive for arthralgias (he c/o b/l hand pain. Increased stiffness upon awakening. Difficult to make fist at times. ).  Neurological: Negative.   Psychiatric/Behavioral: Negative.      Today's Vitals   12/19/17 1024  BP: 114/70  Pulse: 83  Temp: 98.2 F (36.8 C)  TempSrc: Oral  Weight: 188 lb (85.3 kg)  Height: _0  (1.676 m)   Body mass index is 30.34 kg/m.   Objective:  Physical Exam  Constitutional: He is oriented to person, place, and time. He appears well-developed and well-nourished.  Cardiovascular: Normal rate, regular rhythm and normal heart sounds.  Pulmonary/Chest: Effort normal and breath sounds normal.   Musculoskeletal:  POS hand squeeze test b/l  Neurological: He is alert and oriented to person, place, and time.  Psychiatric: He has a normal mood and affect.  Nursing note and vitals reviewed.       Assessment And Plan:     1. Hypertensive heart and renal disease with renal failure, stage 1 through stage 4 or unspecified chronic kidney disease, without heart failure  Well controlled. He will continue with current meds. He is encouraged to avoid adding salt to his foods. He will rto in six months for a full physical examination.   - Lipid Profile  2. Atherosclerosis of native coronary artery of native heart with stable angina pectoris (HCC)  Chronic, yet stable. Importance of statin compliance was discussed with the patient.   3. Chronic renal disease, stage II Chronic. He is encouraged to stay well hydrated.   4. Arthralgia of both hands  I will check an arthritis panel. Squeeze test positive. He is encouraged to follow an anti-inflammatory diet. He is also advised that his sx may be result of meds from hematologist.   - ANA, IFA (with reflex) - Sed Rate (ESR)  5. Encounter for monitoring antihypertensive use   6. Other abnormal glucose  HIS A1C HAS BEEN ELEVATED IN THE PAST. I WILL CHECK AN A1C, BMET TODAY. HE WAS ENCOURAGED TO AVOID SUGARY BEVERAGES AND PROCESSED FOODS INCLUDNG BREADS, RICE AND PASTA.  - Hemoglobin A1c     [name], MD

## 2017-12-20 LAB — LIPID PANEL
CHOLESTEROL TOTAL: 161 mg/dL (ref 100–199)
Chol/HDL Ratio: 3.2 ratio (ref 0.0–5.0)
HDL: 51 mg/dL (ref >39)
LDL Calculated: 97 mg/dL (ref 0–99)
Triglycerides: 67 mg/dL (ref 0–149)
VLDL CHOLESTEROL CAL: 13 mg/dL (ref 5–40)

## 2017-12-20 LAB — SEDIMENTATION RATE: Sed Rate: 4 mm/hr (ref 0–30)

## 2017-12-20 LAB — ANTINUCLEAR ANTIBODIES, IFA: ANA TITER 1: NEGATIVE

## 2017-12-20 LAB — HEMOGLOBIN A1C
ESTIMATED AVERAGE GLUCOSE: 128 mg/dL
HEMOGLOBIN A1C: 6.1 % — AB (ref 4.8–5.6)

## 2017-12-21 NOTE — Progress Notes (Signed)
Here are your lab results:  Your cholesterol is great.  Your a1c is 6.1, this is in prediabetes range. Be sure to avoid sugary beverages.  Your screening test for autoimmune arthritis is negative.   Please let me know if you have any questions or concerns.   Sincerely,    Jermone Geister N. [facility], MD

## 2018-01-01 ENCOUNTER — Encounter: Payer: Self-pay | Admitting: Internal Medicine

## 2018-01-03 ENCOUNTER — Other Ambulatory Visit: Payer: Self-pay | Admitting: Internal Medicine

## 2018-01-22 ENCOUNTER — Inpatient Hospital Stay: Payer: BLUE CROSS/BLUE SHIELD | Attending: Internal Medicine

## 2018-01-22 ENCOUNTER — Other Ambulatory Visit: Payer: BLUE CROSS/BLUE SHIELD

## 2018-01-22 ENCOUNTER — Encounter: Payer: Self-pay | Admitting: Internal Medicine

## 2018-01-22 DIAGNOSIS — Z9079 Acquired absence of other genital organ(s): Secondary | ICD-10-CM | POA: Diagnosis not present

## 2018-01-22 DIAGNOSIS — Z79899 Other long term (current) drug therapy: Secondary | ICD-10-CM | POA: Diagnosis not present

## 2018-01-22 DIAGNOSIS — Z8546 Personal history of malignant neoplasm of prostate: Secondary | ICD-10-CM | POA: Insufficient documentation

## 2018-01-22 DIAGNOSIS — C9210 Chronic myeloid leukemia, BCR/ABL-positive, not having achieved remission: Secondary | ICD-10-CM | POA: Diagnosis not present

## 2018-01-22 DIAGNOSIS — Z7982 Long term (current) use of aspirin: Secondary | ICD-10-CM | POA: Diagnosis not present

## 2018-01-22 DIAGNOSIS — I10 Essential (primary) hypertension: Secondary | ICD-10-CM | POA: Diagnosis not present

## 2018-01-22 LAB — CMP (CANCER CENTER ONLY)
ALT: 39 U/L (ref 0–44)
AST: 22 U/L (ref 15–41)
Albumin: 3.9 g/dL (ref 3.5–5.0)
Alkaline Phosphatase: 109 U/L (ref 38–126)
Anion gap: 9 (ref 5–15)
BILIRUBIN TOTAL: 0.5 mg/dL (ref 0.3–1.2)
BUN: 17 mg/dL (ref 6–20)
CO2: 23 mmol/L (ref 22–32)
Calcium: 9.5 mg/dL (ref 8.9–10.3)
Chloride: 110 mmol/L (ref 98–111)
Creatinine: 1.4 mg/dL — ABNORMAL HIGH (ref 0.61–1.24)
GFR, EST NON AFRICAN AMERICAN: 53 mL/min — AB (ref >60)
Glucose, Bld: 166 mg/dL — ABNORMAL HIGH (ref 70–99)
POTASSIUM: 3.9 mmol/L (ref 3.5–5.1)
Sodium: 142 mmol/L (ref 135–145)
TOTAL PROTEIN: 7.1 g/dL (ref 6.5–8.1)

## 2018-01-22 LAB — CBC WITH DIFFERENTIAL (CANCER CENTER ONLY)
Abs Immature Granulocytes: 0.02 10*3/uL (ref 0.00–0.07)
BASOS PCT: 1 %
Basophils Absolute: 0.1 10*3/uL (ref 0.0–0.1)
EOS ABS: 0.3 10*3/uL (ref 0.0–0.5)
Eosinophils Relative: 4 %
HEMATOCRIT: 43.1 % (ref 39.0–52.0)
Hemoglobin: 14.5 g/dL (ref 13.0–17.0)
IMMATURE GRANULOCYTES: 0 %
LYMPHS ABS: 2.3 10*3/uL (ref 0.7–4.0)
Lymphocytes Relative: 31 %
MCH: 31.5 pg (ref 26.0–34.0)
MCHC: 33.6 g/dL (ref 30.0–36.0)
MCV: 93.5 fL (ref 80.0–100.0)
MONOS PCT: 8 %
Monocytes Absolute: 0.6 10*3/uL (ref 0.1–1.0)
Neutro Abs: 4.1 10*3/uL (ref 1.7–7.7)
Neutrophils Relative %: 56 %
PLATELETS: 255 10*3/uL (ref 150–400)
RBC: 4.61 MIL/uL (ref 4.22–5.81)
RDW: 14.6 % (ref 11.5–15.5)
WBC Count: 7.3 10*3/uL (ref 4.0–10.5)
nRBC: 0 % (ref 0.0–0.2)

## 2018-01-22 LAB — LACTATE DEHYDROGENASE: LDH: 211 U/L — AB (ref 98–192)

## 2018-01-29 ENCOUNTER — Inpatient Hospital Stay (HOSPITAL_BASED_OUTPATIENT_CLINIC_OR_DEPARTMENT_OTHER): Payer: BLUE CROSS/BLUE SHIELD | Admitting: Internal Medicine

## 2018-01-29 ENCOUNTER — Encounter: Payer: Self-pay | Admitting: Internal Medicine

## 2018-01-29 VITALS — BP 117/91 | HR 76 | Temp 98.3°F | Resp 18 | Ht 66.0 in | Wt 187.0 lb

## 2018-01-29 DIAGNOSIS — I10 Essential (primary) hypertension: Secondary | ICD-10-CM

## 2018-01-29 DIAGNOSIS — Z79899 Other long term (current) drug therapy: Secondary | ICD-10-CM

## 2018-01-29 DIAGNOSIS — Z8546 Personal history of malignant neoplasm of prostate: Secondary | ICD-10-CM

## 2018-01-29 DIAGNOSIS — C9210 Chronic myeloid leukemia, BCR/ABL-positive, not having achieved remission: Secondary | ICD-10-CM | POA: Diagnosis not present

## 2018-01-29 DIAGNOSIS — Z5111 Encounter for antineoplastic chemotherapy: Secondary | ICD-10-CM

## 2018-01-29 DIAGNOSIS — Z9079 Acquired absence of other genital organ(s): Secondary | ICD-10-CM

## 2018-01-29 DIAGNOSIS — E118 Type 2 diabetes mellitus with unspecified complications: Secondary | ICD-10-CM

## 2018-01-29 DIAGNOSIS — Z7982 Long term (current) use of aspirin: Secondary | ICD-10-CM

## 2018-01-29 NOTE — Progress Notes (Signed)
[name] [phone]   [phone]  OFFICE PROGRESS NOTE  [name], [place] [address] [address]  DIAGNOSIS:  1) Chronic myeloid leukemia diagnosed in November of 2014. 2) History of prostate adenocarcinoma diagnosed in 2006  PRIOR THERAPY:  1) Status post prostatectomy. 2) Gleevec 400 mg by mouth daily, started 02/10/2013, discontinued on 09/13/2016 secondary to disease progression.  CURRENT THERAPY: Tasigna 400 mg by mouth twice a day. First dose started 09/21/2016.  Status post 16 months of treatment.  INTERVAL HISTORY: [name] 62 y.o. male returns to the clinic today for follow-up visit.  The patient is feeling fine today with no concerning complaints.  He denied having any significant weight loss or night sweats.  He has no nausea, vomiting, diarrhea or constipation.  He denied having any chest pain, shortness breath, cough or hemoptysis.  He has no bleeding issues.  He has been tolerating his treatment with Doreen Salvage fairly well.  He is here today for evaluation after repeating CBC, comprehensive metabolic panel, LDH as well as molecular studies for BCR/ABL  MEDICAL HISTORY: Past Medical History:  Diagnosis Date  . Arthritis   . Bladder disorder   . Cancer ([place])    hx prostate cancer and CML  . Chronic myelocytic leukemia ([place])   . Coronary artery disease    with prior MI 2002([facility]) and Cfx(2006) and RCA (2004) stents . EF 45-50%  . Coronary atherosclerosis of native coronary artery    stable and suspect a component of CAS  . Diverticulosis    seen on colonoscopy in 2008  . Goals of care, counseling/discussion 09/13/2016  . H/O colonoscopy 2014  . Hyperlipidemia   . Hypertension   . Myocardial infarction ([place]) 02,6/12  . Prostate cancer ([place])     ALLERGIES:  is allergic to contrast media [iodinated diagnostic agents].  MEDICATIONS:  Current Outpatient Medications  Medication Sig  Dispense Refill  . Ascorbic Acid (VITAMIN C) 1000 MG tablet Take 1,000 mg by mouth daily.    Marland Kitchen aspirin 81 MG chewable tablet Chew 162 mg by mouth daily.     . cholecalciferol (VITAMIN D) 1000 UNITS tablet Take 5,000 Units by mouth daily.     . Coenzyme Q10 (COQ10) 100 MG CAPS Take 100 mg by mouth daily.    . cyclobenzaprine (FLEXERIL) 10 MG tablet Take by mouth.    . darifenacin (ENABLEX) 15 MG 24 hr tablet Take 15 mg by mouth daily.     Marland Kitchen DEXILANT 60 MG capsule TAKE 1 CAPSULE BY MOUTH ONCE DAILY 90 capsule 1  . hydrOXYzine (ATARAX/VISTARIL) 25 MG tablet Take 1 tablet (25 mg total) by mouth 2 (two) times daily. 90 tablet 0  . isosorbide mononitrate (IMDUR) 60 MG 24 hr tablet TAKE 1 TABLET BY MOUTH ONCE DAILY 90 tablet 3  . magnesium oxide (MAG-OX) 400 MG tablet Take 400 mg by mouth daily as needed (cramps).    . metoprolol succinate (TOPROL-XL) 100 MG 24 hr tablet TAKE 1 TABLET BY MOUTH WITH MEALS OR IMMEDIATELY FOLLOWING 90 tablet 3  . nilotinib (TASIGNA) 200 MG capsule TAKE 2 CAPSULES (400MG ) BY MOUTH EVERY 12 HOURS ON AN EMPTY STOMACH 1 HOUR BEFORE OR 2 HOURS AFTER A MEAL 112 capsule PRN  . nitroGLYCERIN (NITROLINGUAL) 0.4 MG/SPRAY spray Place 1 spray under the tongue every 5 (five) minutes x 3 doses as needed for chest pain. 12 g 2  . olmesartan (BENICAR) 20 MG  tablet Take 1 tablet (20 mg total) by mouth daily. 90 tablet 3  . PROAIR HFA 108 (90 Base) MCG/ACT inhaler Inhale 2 puffs into the lungs every 6 (six) hours as needed for wheezing (cough).   0  . rosuvastatin (CRESTOR) 20 MG tablet TAKE 1 TABLET BY MOUTH ONCE DAILY 90 tablet 3   No current facility-administered medications for this visit.     SURGICAL HISTORY:  Past Surgical History:  Procedure Laterality Date  . CARDIAC CATHETERIZATION     normal EF, patent stents without obstructive disease (on Plavix X 1 month only)  . CAROTID STENT    . FRACTURE SURGERY     rt wrist  . LEFT HEART CATHETERIZATION WITH CORONARY ANGIOGRAM N/A  02/04/2012   Procedure: LEFT HEART CATHETERIZATION WITH CORONARY ANGIOGRAM;  Surgeon: Sinclair Grooms, MD;  Location: [facility] CATH LAB;  Service: Cardiovascular;  Laterality: N/A;  . ORIF ANKLE FRACTURE  07/04/2011   Procedure: OPEN REDUCTION INTERNAL FIXATION (ORIF) ANKLE FRACTURE;  Surgeon: Alta Corning, MD;  Location: [facility];  Service: Orthopedics;  Laterality: Right;  . TOE SURGERY     rt great toe  . TONSILLECTOMY    . WRIST SURGERY     rt and lt cysts removed    REVIEW OF SYSTEMS:  Constitutional: negative Eyes: negative Ears, nose, mouth, throat, and face: negative Respiratory: negative Cardiovascular: negative Gastrointestinal: negative Genitourinary:negative Integument/breast: negative Hematologic/lymphatic: negative Musculoskeletal:negative Neurological: negative Behavioral/Psych: negative Endocrine: negative Allergic/Immunologic: negative   PHYSICAL EXAMINATION: General appearance: alert, cooperative and no distress Head: Normocephalic, without obvious abnormality, atraumatic Neck: no adenopathy, no JVD, supple, symmetrical, trachea midline and thyroid not enlarged, symmetric, no tenderness/mass/nodules Lymph nodes: Cervical, supraclavicular, and axillary nodes normal. Resp: clear to auscultation bilaterally Back: symmetric, no curvature. ROM normal. No CVA tenderness. Cardio: regular rate and rhythm, S1, S2 normal, no murmur, click, rub or gallop GI: soft, non-tender; bowel sounds normal; no masses,  no organomegaly Extremities: extremities normal, atraumatic, no cyanosis or edema Neurologic: Alert and oriented X 3, normal strength and tone. Normal symmetric reflexes. Normal coordination and gait    ECOG PERFORMANCE STATUS: 1 - Symptomatic but completely ambulatory  Blood pressure (!) 117/91, pulse 76, temperature 98.3 F (36.8 C), temperature source Oral, resp. rate 18, height 5\' 6"  (1.676 m), weight 187 lb (84.8 kg), SpO2 100 %.  LABORATORY  DATA: Lab Results  Component Value Date   WBC 7.3 01/22/2018   HGB 14.5 01/22/2018   HCT 43.1 01/22/2018   MCV 93.5 01/22/2018   PLT 255 01/22/2018      Chemistry      Component Value Date/Time   NA 142 01/22/2018 1055   NA 140 03/01/2017 1117   K 3.9 01/22/2018 1055   K 4.3 03/01/2017 1117   CL 110 01/22/2018 1055   CO2 23 01/22/2018 1055   CO2 24 03/01/2017 1117   BUN 17 01/22/2018 1055   BUN 14.5 03/01/2017 1117   CREATININE 1.40 (H) 01/22/2018 1055   CREATININE 1.3 03/01/2017 1117      Component Value Date/Time   CALCIUM 9.5 01/22/2018 1055   CALCIUM 9.2 03/01/2017 1117   ALKPHOS 109 01/22/2018 1055   ALKPHOS 68 03/01/2017 1117   AST 22 01/22/2018 1055   AST 20 03/01/2017 1117   ALT 39 01/22/2018 1055   ALT 22 03/01/2017 1117   BILITOT 0.5 01/22/2018 1055   BILITOT 0.55 03/01/2017 1117       RADIOGRAPHIC STUDIES: No results found.  ASSESSMENT AND PLAN:  This is a very pleasant 62 years old African-American male with chronic myeloid leukemia diagnosed in November 2014 and has been on treatment with Gleevec 400 mg by mouth daily since that time and has been tolerating the treatment well. The patient had evidence for molecular relapse 2 months ago and he was started on treatment with Tasigna 400 mg by mouth twice a day status post 16 months. The patient has been tolerating this treatment well with no concerning adverse effects. Repeat CBC showed no morphological abnormalities for disease recurrence.  The patient also has molecular study for BCR/ABL that showed major molecular response. I discussed the lab results with the patient and recommended for him to continue his current treatment with Tasigna with the same dose. The patient mentioned that his insurance will change starting January 2020 and [facility] will be out of network for his new insurance.  He would receive referral from his primary care physician to see a hematologist at [facility] where he  will be in network with his insurance.  I recommended for him to see Dr. Florene Glen at [facility] for evaluation and follow-up of his condition. I will see the patient on as-needed basis at this point. He was advised to call immediately if he has any concerning symptoms in the interval. The patient voices understanding of current disease status and treatment options and is in agreement with the current care plan. All questions were answered. The patient knows to call the clinic with any problems, questions or concerns. We can certainly see the patient much sooner if necessary.  Disclaimer: This note was dictated with voice recognition software. Similar sounding words can inadvertently be transcribed and may not be corrected upon review.

## 2018-02-12 ENCOUNTER — Telehealth: Payer: Self-pay | Admitting: Interventional Cardiology

## 2018-02-12 NOTE — Telephone Encounter (Signed)
I do not know the physicians in the [facility] network. Perhaps his primary physician can refer him or he can go on the website and choose.

## 2018-02-12 NOTE — Telephone Encounter (Signed)
New Message   Patient is calling to request a referral to a cardiologist in the [facility] network because at the first of the year [facility] providers will be out of network with his insurance. Please call

## 2018-02-13 NOTE — Telephone Encounter (Signed)
Spoke with the patient and advised that Dr. [name] did not know any [facility] physicians. Advised him to go online or contact PCP for referral.

## 2018-02-18 ENCOUNTER — Other Ambulatory Visit: Payer: Self-pay | Admitting: Neurological Surgery

## 2018-02-18 ENCOUNTER — Encounter: Payer: Self-pay | Admitting: Internal Medicine

## 2018-02-18 DIAGNOSIS — M5412 Radiculopathy, cervical region: Secondary | ICD-10-CM

## 2018-03-03 ENCOUNTER — Ambulatory Visit
Admission: RE | Admit: 2018-03-03 | Discharge: 2018-03-03 | Disposition: A | Discharge status: Home / Self Care | Payer: BLUE CROSS/BLUE SHIELD | Source: Ambulatory Visit | Attending: Neurological Surgery | Admitting: Neurological Surgery

## 2018-03-03 DIAGNOSIS — M5412 Radiculopathy, cervical region: Secondary | ICD-10-CM

## 2018-03-03 MED ORDER — TRIAMCINOLONE ACETONIDE 40 MG/ML IJ SUSP (RADIOLOGY)
60.0000 mg | Freq: Once | INTRAMUSCULAR | Status: AC
  Administered 2018-03-03: 60 mg via EPIDURAL

## 2018-03-03 MED ORDER — IOPAMIDOL (ISOVUE-M 300) INJECTION 61%
1.0000 mL | Freq: Once | INTRAMUSCULAR | Status: AC | PRN
  Administered 2018-03-03: 1 mL via EPIDURAL

## 2018-03-03 NOTE — Discharge Instructions (Signed)

## 2018-03-13 LAB — BCR/ABL

## 2018-04-02 ENCOUNTER — Encounter: Payer: Self-pay | Admitting: Internal Medicine

## 2018-04-08 ENCOUNTER — Other Ambulatory Visit: Payer: Self-pay | Admitting: Medical Oncology

## 2018-04-08 DIAGNOSIS — C9210 Chronic myeloid leukemia, BCR/ABL-positive, not having achieved remission: Secondary | ICD-10-CM

## 2018-04-08 DIAGNOSIS — I10 Essential (primary) hypertension: Secondary | ICD-10-CM

## 2018-04-08 MED ORDER — NILOTINIB HCL 200 MG PO CAPS: ORAL_CAPSULE | ORAL | 1 refills | Status: DC

## 2018-04-10 ENCOUNTER — Other Ambulatory Visit: Payer: Self-pay | Admitting: Medical Oncology

## 2018-04-10 ENCOUNTER — Encounter: Payer: Self-pay | Admitting: Medical Oncology

## 2018-04-10 ENCOUNTER — Telehealth: Payer: Self-pay | Admitting: Medical Oncology

## 2018-04-10 NOTE — Telephone Encounter (Signed)
Called [name] at [facility] and discontinued the Tasigna because pt is receiving care under another provider. Email sent to pt about this .

## 2018-06-19 ENCOUNTER — Encounter: Payer: BLUE CROSS/BLUE SHIELD | Admitting: Internal Medicine

## 2018-07-02 ENCOUNTER — Telehealth: Payer: Self-pay | Admitting: Interventional Cardiology

## 2018-07-02 NOTE — Telephone Encounter (Signed)
Called patient to schedule a virtual visit. He said that he had to change all of his providers due to his insurance change.  He is now seeing [facility] providers, per [employer].  He hopes to change insurances next year so that he can continue to see Dr. [name].

## 2018-08-29 ENCOUNTER — Other Ambulatory Visit: Payer: Self-pay | Admitting: Internal Medicine

## 2018-09-03 LAB — NOVEL CORONAVIRUS, NAA: SARS-CoV-2, NAA: NOT DETECTED

## 2018-10-08 ENCOUNTER — Other Ambulatory Visit: Payer: Self-pay | Admitting: Internal Medicine

## 2018-10-30 ENCOUNTER — Other Ambulatory Visit: Payer: Self-pay

## 2018-10-30 DIAGNOSIS — Z20822 Contact with and (suspected) exposure to covid-19: Secondary | ICD-10-CM

## 2018-11-01 LAB — NOVEL CORONAVIRUS, NAA: SARS-CoV-2, NAA: NOT DETECTED

## 2020-10-12 IMAGING — XA DG INJECT/[PERSON_NAME] INC NEEDLE/CATH/PLC EPI/CERV/THOR W/IMG
2 series · 2 of 2 positions shown · non-contrast
Comparison: none

CLINICAL DATA: Cervical radiculopathy. Displacement of the C3-4
cervical disc. C4 cervical radiculopathy.

[Series 1: ortho standard · 1 of 1 slices shown (1 of 2)]
[im 1/1]
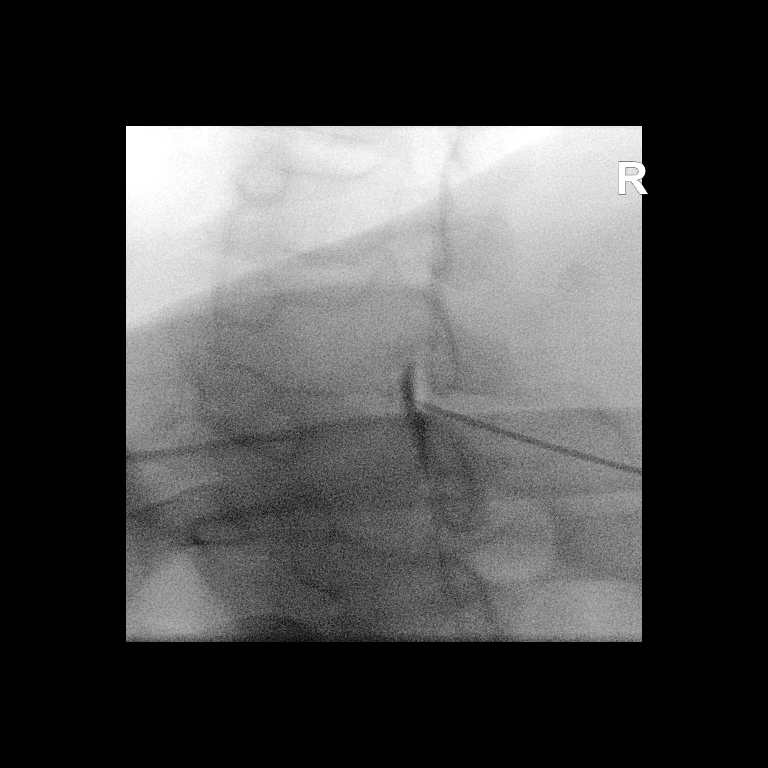

[Series 2: ortho standard · 1 of 1 slices shown (2 of 2)]
[im 1/1]
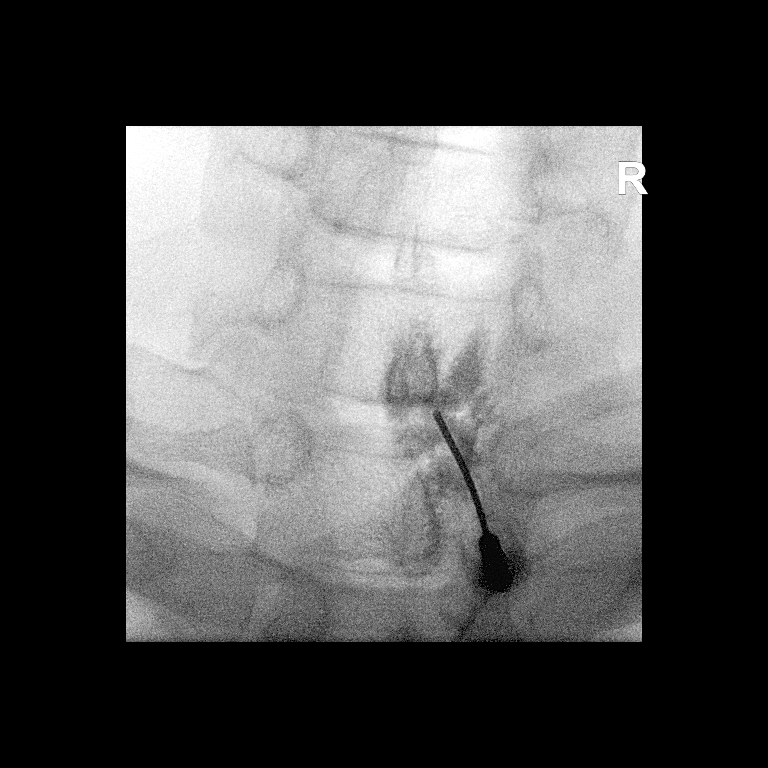

[2 of 2 positions shown; findings below may reference images not displayed]

FLUOROSCOPY TIME:  Radiation Exposure Index (as provided by the
fluoroscopic device): 8.6 uGy*m2

Fluoroscopy Time:  19 seconds

Number of Acquired Images:  0

PROCEDURE:
CERVICAL EPIDURAL INJECTION

An interlaminar approach was performed on the right at C7-T1. A 20
gauge epidural needle was advanced using loss-of-resistance
technique.

DIAGNOSTIC EPIDURAL INJECTION

Injection of Isovue-M 300 shows a good epidural pattern with spread
above and below the level of needle placement, primarily on the
right. No vascular opacification is seen. THERAPEUTIC

EPIDURAL INJECTION

1.5 ml of Kenalog 40 mixed with 1 ml of 1% Lidocaine and 2 ml of
normal saline were then instilled. The procedure was well-tolerated,
and the patient was discharged thirty minutes following the
injection in good condition.
IMPRESSION: Technically successful first epidural injection on the right at
C7-T1.

## 2022-05-23 ENCOUNTER — Other Ambulatory Visit: Payer: Self-pay

## 2022-05-23 ENCOUNTER — Other Ambulatory Visit (HOSPITAL_BASED_OUTPATIENT_CLINIC_OR_DEPARTMENT_OTHER): Payer: Self-pay

## 2022-05-23 ENCOUNTER — Emergency Department (HOSPITAL_BASED_OUTPATIENT_CLINIC_OR_DEPARTMENT_OTHER): Payer: PPO | Admitting: Radiology

## 2022-05-23 ENCOUNTER — Encounter (HOSPITAL_BASED_OUTPATIENT_CLINIC_OR_DEPARTMENT_OTHER): Payer: Self-pay | Admitting: Emergency Medicine

## 2022-05-23 ENCOUNTER — Inpatient Hospital Stay (HOSPITAL_BASED_OUTPATIENT_CLINIC_OR_DEPARTMENT_OTHER)
Admission: EM | Admit: 2022-05-23 | Discharge: 2022-05-24 | DRG: 250 | Disposition: A | Discharge status: Home / Self Care | Payer: PPO | Attending: Internal Medicine | Admitting: Internal Medicine

## 2022-05-23 ENCOUNTER — Encounter (HOSPITAL_COMMUNITY)
Admission: EM | Disposition: A | Discharge status: Home / Self Care | Payer: Self-pay | Source: Home / Self Care | Attending: Internal Medicine

## 2022-05-23 DIAGNOSIS — F419 Anxiety disorder, unspecified: Secondary | ICD-10-CM | POA: Diagnosis present

## 2022-05-23 DIAGNOSIS — E118 Type 2 diabetes mellitus with unspecified complications: Secondary | ICD-10-CM

## 2022-05-23 DIAGNOSIS — Z87891 Personal history of nicotine dependence: Secondary | ICD-10-CM

## 2022-05-23 DIAGNOSIS — Z91041 Radiographic dye allergy status: Secondary | ICD-10-CM

## 2022-05-23 DIAGNOSIS — Z6826 Body mass index (BMI) 26.0-26.9, adult: Secondary | ICD-10-CM

## 2022-05-23 DIAGNOSIS — T82855A Stenosis of coronary artery stent, initial encounter: Secondary | ICD-10-CM | POA: Diagnosis not present

## 2022-05-23 DIAGNOSIS — C9210 Chronic myeloid leukemia, BCR/ABL-positive, not having achieved remission: Secondary | ICD-10-CM | POA: Diagnosis present

## 2022-05-23 DIAGNOSIS — I2510 Atherosclerotic heart disease of native coronary artery without angina pectoris: Secondary | ICD-10-CM | POA: Diagnosis not present

## 2022-05-23 DIAGNOSIS — I214 Non-ST elevation (NSTEMI) myocardial infarction: Secondary | ICD-10-CM | POA: Diagnosis present

## 2022-05-23 DIAGNOSIS — G4733 Obstructive sleep apnea (adult) (pediatric): Secondary | ICD-10-CM | POA: Diagnosis present

## 2022-05-23 DIAGNOSIS — Z7982 Long term (current) use of aspirin: Secondary | ICD-10-CM

## 2022-05-23 DIAGNOSIS — E663 Overweight: Secondary | ICD-10-CM | POA: Diagnosis present

## 2022-05-23 DIAGNOSIS — I10 Essential (primary) hypertension: Secondary | ICD-10-CM | POA: Diagnosis present

## 2022-05-23 DIAGNOSIS — C9211 Chronic myeloid leukemia, BCR/ABL-positive, in remission: Secondary | ICD-10-CM | POA: Diagnosis present

## 2022-05-23 DIAGNOSIS — Z9861 Coronary angioplasty status: Secondary | ICD-10-CM

## 2022-05-23 DIAGNOSIS — Z79899 Other long term (current) drug therapy: Secondary | ICD-10-CM

## 2022-05-23 DIAGNOSIS — Z833 Family history of diabetes mellitus: Secondary | ICD-10-CM

## 2022-05-23 DIAGNOSIS — N529 Male erectile dysfunction, unspecified: Secondary | ICD-10-CM | POA: Diagnosis present

## 2022-05-23 DIAGNOSIS — Z7985 Long-term (current) use of injectable non-insulin antidiabetic drugs: Secondary | ICD-10-CM

## 2022-05-23 DIAGNOSIS — N329 Bladder disorder, unspecified: Secondary | ICD-10-CM | POA: Diagnosis present

## 2022-05-23 DIAGNOSIS — E785 Hyperlipidemia, unspecified: Secondary | ICD-10-CM | POA: Diagnosis present

## 2022-05-23 DIAGNOSIS — E782 Mixed hyperlipidemia: Secondary | ICD-10-CM | POA: Diagnosis not present

## 2022-05-23 DIAGNOSIS — Z8546 Personal history of malignant neoplasm of prostate: Secondary | ICD-10-CM

## 2022-05-23 DIAGNOSIS — K219 Gastro-esophageal reflux disease without esophagitis: Secondary | ICD-10-CM | POA: Diagnosis present

## 2022-05-23 DIAGNOSIS — Y831 Surgical operation with implant of artificial internal device as the cause of abnormal reaction of the patient, or of later complication, without mention of misadventure at the time of the procedure: Secondary | ICD-10-CM | POA: Diagnosis present

## 2022-05-23 DIAGNOSIS — I252 Old myocardial infarction: Secondary | ICD-10-CM

## 2022-05-23 DIAGNOSIS — E119 Type 2 diabetes mellitus without complications: Secondary | ICD-10-CM | POA: Diagnosis present

## 2022-05-23 HISTORY — PX: LEFT HEART CATH AND CORONARY ANGIOGRAPHY: CATH118249

## 2022-05-23 HISTORY — PX: CORONARY PRESSURE/FFR STUDY: CATH118243

## 2022-05-23 HISTORY — PX: CORONARY BALLOON ANGIOPLASTY: CATH118233

## 2022-05-23 LAB — HEPARIN LEVEL (UNFRACTIONATED): Heparin Unfractionated: >1.1 IU/mL — ABNORMAL HIGH (ref 0.30–0.70)

## 2022-05-23 LAB — TROPONIN I (HIGH SENSITIVITY)
Troponin I (High Sensitivity): 496 ng/L (ref <18)
Troponin I (High Sensitivity): 881 ng/L (ref <18)
Troponin I (High Sensitivity): 1275 ng/L (ref <18)
Troponin I (High Sensitivity): 1586 ng/L (ref <18)

## 2022-05-23 LAB — BASIC METABOLIC PANEL
Anion gap: 5 (ref 5–15)
BUN: 13 mg/dL (ref 8–23)
CO2: 29 mmol/L (ref 22–32)
Calcium: 9.6 mg/dL (ref 8.9–10.3)
Chloride: 106 mmol/L (ref 98–111)
Creatinine, Ser: 1.12 mg/dL (ref 0.61–1.24)
GFR, Estimated: >60 mL/min (ref >60)
Glucose, Bld: 96 mg/dL (ref 70–99)
Potassium: 3.9 mmol/L (ref 3.5–5.1)
Sodium: 140 mmol/L (ref 135–145)

## 2022-05-23 LAB — CBC
HCT: 44.7 % (ref 39.0–52.0)
Hemoglobin: 15.8 g/dL (ref 13.0–17.0)
MCH: 31.9 pg (ref 26.0–34.0)
MCHC: 35.3 g/dL (ref 30.0–36.0)
MCV: 90.3 fL (ref 80.0–100.0)
Platelets: 230 10*3/uL (ref 150–400)
RBC: 4.95 MIL/uL (ref 4.22–5.81)
RDW: 13.3 % (ref 11.5–15.5)
WBC: 9.7 10*3/uL (ref 4.0–10.5)
nRBC: 0 % (ref 0.0–0.2)

## 2022-05-23 LAB — POCT ACTIVATED CLOTTING TIME: Activated Clotting Time: 390 seconds

## 2022-05-23 LAB — HIV ANTIBODY (ROUTINE TESTING W REFLEX): HIV Screen 4th Generation wRfx: NONREACTIVE

## 2022-05-23 SURGERY — CORONARY BALLOON ANGIOPLASTY
Anesthesia: LOCAL

## 2022-05-23 MED ORDER — ACETAMINOPHEN 325 MG PO TABS
650.0000 mg | ORAL_TABLET | ORAL | Status: DC | PRN
  Filled 2022-05-23: qty 2

## 2022-05-23 MED ORDER — MIRABEGRON ER 50 MG PO TB24
50.0000 mg | ORAL_TABLET | Freq: Every day | ORAL | Status: DC
  Administered 2022-05-24: 50 mg via ORAL
  Filled 2022-05-23: qty 1

## 2022-05-23 MED ORDER — NITROGLYCERIN 1 MG/10 ML FOR IR/CATH LAB
INTRA_ARTERIAL | Status: AC
  Filled 2022-05-23: qty 10

## 2022-05-23 MED ORDER — SODIUM CHLORIDE 0.9 % IV SOLN: 250.0000 mL | INTRAVENOUS | Status: DC | PRN

## 2022-05-23 MED ORDER — ONDANSETRON HCL 4 MG/2ML IJ SOLN: 4.0000 mg | Freq: Four times a day (QID) | INTRAMUSCULAR | Status: DC | PRN

## 2022-05-23 MED ORDER — TICAGRELOR 90 MG PO TABS
90.0000 mg | ORAL_TABLET | Freq: Two times a day (BID) | ORAL | Status: DC
  Administered 2022-05-24: 90 mg via ORAL
  Filled 2022-05-23: qty 1

## 2022-05-23 MED ORDER — NITROGLYCERIN 0.4 MG/SPRAY TL SOLN: 1.0000 | Status: DC | PRN

## 2022-05-23 MED ORDER — SODIUM CHLORIDE 0.9% FLUSH: 3.0000 mL | INTRAVENOUS | Status: DC | PRN

## 2022-05-23 MED ORDER — MIDAZOLAM HCL 2 MG/2ML IJ SOLN
INTRAMUSCULAR | Status: DC | PRN
  Administered 2022-05-23: 1 mg via INTRAVENOUS
  Administered 2022-05-23: 2 mg via INTRAVENOUS

## 2022-05-23 MED ORDER — SODIUM CHLORIDE 0.9% FLUSH
3.0000 mL | Freq: Two times a day (BID) | INTRAVENOUS | Status: DC
  Administered 2022-05-23 – 2022-05-24 (×2): 3 mL via INTRAVENOUS

## 2022-05-23 MED ORDER — HEPARIN BOLUS VIA INFUSION
4000.0000 [IU] | Freq: Once | INTRAVENOUS | Status: AC
  Administered 2022-05-23: 4000 [IU] via INTRAVENOUS

## 2022-05-23 MED ORDER — VERAPAMIL HCL 2.5 MG/ML IV SOLN
INTRAVENOUS | Status: AC
  Filled 2022-05-23: qty 2

## 2022-05-23 MED ORDER — NITROGLYCERIN 2 % TD OINT
0.5000 [in_us] | TOPICAL_OINTMENT | Freq: Once | TRANSDERMAL | Status: AC
  Administered 2022-05-23: 0.5 [in_us] via TOPICAL
  Filled 2022-05-23: qty 1

## 2022-05-23 MED ORDER — FAMOTIDINE 20 MG PO TABS
20.0000 mg | ORAL_TABLET | Freq: Two times a day (BID) | ORAL | Status: DC
  Administered 2022-05-23 – 2022-05-24 (×2): 20 mg via ORAL
  Filled 2022-05-23 (×3): qty 1

## 2022-05-23 MED ORDER — ASPIRIN 81 MG PO CHEW
81.0000 mg | CHEWABLE_TABLET | Freq: Every day | ORAL | Status: DC
  Administered 2022-05-24: 81 mg via ORAL
  Filled 2022-05-23: qty 1

## 2022-05-23 MED ORDER — VERAPAMIL HCL 2.5 MG/ML IV SOLN
INTRAVENOUS | Status: DC | PRN
  Administered 2022-05-23 (×2): 10 mL via INTRA_ARTERIAL

## 2022-05-23 MED ORDER — MIDAZOLAM HCL 2 MG/2ML IJ SOLN
INTRAMUSCULAR | Status: AC
  Filled 2022-05-23: qty 2

## 2022-05-23 MED ORDER — SODIUM CHLORIDE 0.9 % WEIGHT BASED INFUSION: 1.0000 mL/kg/h | INTRAVENOUS | Status: DC

## 2022-05-23 MED ORDER — METOPROLOL SUCCINATE ER 100 MG PO TB24
100.0000 mg | ORAL_TABLET | Freq: Every day | ORAL | Status: DC
  Administered 2022-05-24: 100 mg via ORAL
  Filled 2022-05-23: qty 1

## 2022-05-23 MED ORDER — ACETAMINOPHEN 325 MG PO TABS
650.0000 mg | ORAL_TABLET | ORAL | Status: DC | PRN
  Administered 2022-05-23: 650 mg via ORAL

## 2022-05-23 MED ORDER — IOHEXOL 350 MG/ML SOLN
INTRAVENOUS | Status: DC | PRN
  Administered 2022-05-23: 100 mL

## 2022-05-23 MED ORDER — LIDOCAINE HCL (PF) 1 % IJ SOLN
INTRAMUSCULAR | Status: AC
  Filled 2022-05-23: qty 30

## 2022-05-23 MED ORDER — SODIUM CHLORIDE 0.9 % WEIGHT BASED INFUSION: 3.0000 mL/kg/h | INTRAVENOUS | Status: DC

## 2022-05-23 MED ORDER — TICAGRELOR 90 MG PO TABS
ORAL_TABLET | ORAL | Status: AC
  Filled 2022-05-23: qty 2

## 2022-05-23 MED ORDER — ASPIRIN 81 MG PO CHEW: 81.0000 mg | CHEWABLE_TABLET | ORAL | Status: DC

## 2022-05-23 MED ORDER — LABETALOL HCL 5 MG/ML IV SOLN: 10.0000 mg | INTRAVENOUS | Status: AC | PRN

## 2022-05-23 MED ORDER — SODIUM CHLORIDE 0.9 % IV SOLN: INTRAVENOUS | Status: AC

## 2022-05-23 MED ORDER — ROSUVASTATIN CALCIUM 20 MG PO TABS
20.0000 mg | ORAL_TABLET | Freq: Every day | ORAL | Status: DC
  Administered 2022-05-24: 20 mg via ORAL
  Filled 2022-05-23: qty 1

## 2022-05-23 MED ORDER — ASPIRIN 81 MG PO CHEW: 81.0000 mg | CHEWABLE_TABLET | Freq: Every day | ORAL | Status: DC

## 2022-05-23 MED ORDER — HEPARIN SODIUM (PORCINE) 1000 UNIT/ML IJ SOLN
INTRAMUSCULAR | Status: DC | PRN
  Administered 2022-05-23: 5000 [IU] via INTRAVENOUS
  Administered 2022-05-23: 4000 [IU] via INTRAVENOUS

## 2022-05-23 MED ORDER — TICAGRELOR 90 MG PO TABS
ORAL_TABLET | ORAL | Status: DC | PRN
  Administered 2022-05-23: 180 mg via ORAL

## 2022-05-23 MED ORDER — HEPARIN SODIUM (PORCINE) 1000 UNIT/ML IJ SOLN
INTRAMUSCULAR | Status: AC
  Filled 2022-05-23: qty 10

## 2022-05-23 MED ORDER — ASPIRIN 81 MG PO CHEW
324.0000 mg | CHEWABLE_TABLET | Freq: Once | ORAL | Status: AC
  Administered 2022-05-23: 324 mg via ORAL
  Filled 2022-05-23: qty 4

## 2022-05-23 MED ORDER — HEPARIN (PORCINE) 25000 UT/250ML-% IV SOLN
900.0000 [IU]/h | INTRAVENOUS | Status: DC
  Administered 2022-05-23: 900 [IU]/h via INTRAVENOUS
  Filled 2022-05-23: qty 250

## 2022-05-23 MED ORDER — PANTOPRAZOLE SODIUM 40 MG PO TBEC
40.0000 mg | DELAYED_RELEASE_TABLET | Freq: Every day | ORAL | Status: DC
  Administered 2022-05-24: 40 mg via ORAL
  Filled 2022-05-23: qty 1

## 2022-05-23 MED ORDER — FENTANYL CITRATE (PF) 100 MCG/2ML IJ SOLN
INTRAMUSCULAR | Status: AC
  Filled 2022-05-23: qty 2

## 2022-05-23 MED ORDER — NITROGLYCERIN 0.4 MG SL SUBL
0.4000 mg | SUBLINGUAL_TABLET | SUBLINGUAL | Status: DC | PRN
  Administered 2022-05-24 (×3): 0.4 mg via SUBLINGUAL
  Filled 2022-05-23 (×3): qty 1

## 2022-05-23 MED ORDER — DIPHENHYDRAMINE HCL 50 MG/ML IJ SOLN
25.0000 mg | Freq: Once | INTRAMUSCULAR | Status: AC
  Administered 2022-05-23: 25 mg via INTRAVENOUS
  Filled 2022-05-23: qty 1

## 2022-05-23 MED ORDER — SODIUM CHLORIDE 0.9% FLUSH
3.0000 mL | Freq: Two times a day (BID) | INTRAVENOUS | Status: DC
  Administered 2022-05-24: 3 mL via INTRAVENOUS

## 2022-05-23 MED ORDER — ISOSORBIDE MONONITRATE ER 60 MG PO TB24
60.0000 mg | ORAL_TABLET | Freq: Every day | ORAL | Status: DC
  Administered 2022-05-24: 60 mg via ORAL
  Filled 2022-05-23: qty 1

## 2022-05-23 MED ORDER — HEPARIN (PORCINE) IN NACL 1000-0.9 UT/500ML-% IV SOLN
INTRAVENOUS | Status: DC | PRN
  Administered 2022-05-23 (×2): 500 mL

## 2022-05-23 MED ORDER — LIDOCAINE HCL (PF) 1 % IJ SOLN
INTRAMUSCULAR | Status: DC | PRN
  Administered 2022-05-23: 2 mL via INTRADERMAL

## 2022-05-23 MED ORDER — FENTANYL CITRATE (PF) 100 MCG/2ML IJ SOLN
INTRAMUSCULAR | Status: DC | PRN
  Administered 2022-05-23 (×2): 25 ug via INTRAVENOUS
  Administered 2022-05-23: 50 ug via INTRAVENOUS

## 2022-05-23 MED ORDER — HYDRALAZINE HCL 20 MG/ML IJ SOLN: 10.0000 mg | INTRAMUSCULAR | Status: AC | PRN

## 2022-05-23 MED ORDER — EZETIMIBE 10 MG PO TABS
10.0000 mg | ORAL_TABLET | Freq: Every day | ORAL | Status: DC
  Administered 2022-05-24: 10 mg via ORAL
  Filled 2022-05-23: qty 1

## 2022-05-23 MED ORDER — METHYLPREDNISOLONE SODIUM SUCC 125 MG IJ SOLR
125.0000 mg | Freq: Once | INTRAMUSCULAR | Status: AC
  Administered 2022-05-23: 125 mg via INTRAVENOUS
  Filled 2022-05-23: qty 2

## 2022-05-23 MED ORDER — DARIFENACIN HYDROBROMIDE ER 15 MG PO TB24
15.0000 mg | ORAL_TABLET | Freq: Every day | ORAL | Status: DC
  Administered 2022-05-24: 15 mg via ORAL
  Filled 2022-05-23: qty 1

## 2022-05-23 MED ORDER — NITROGLYCERIN 1 MG/10 ML FOR IR/CATH LAB
INTRA_ARTERIAL | Status: DC | PRN
  Administered 2022-05-23: 500 ug via INTRA_ARTERIAL

## 2022-05-23 SURGICAL SUPPLY — 17 items
BALLN WOLVERINE 2.50X10 (BALLOONS) ×1
BALLOON WOLVERINE 2.50X10 (BALLOONS) IMPLANT
CATH 5FR JL3.5 JR4 ANG PIG MP (CATHETERS) IMPLANT
CATH LAUNCHER 6FR EBU3.5 (CATHETERS) IMPLANT
DEVICE RAD COMP TR BAND LRG (VASCULAR PRODUCTS) IMPLANT
GLIDESHEATH SLEND SS 6F .021 (SHEATH) IMPLANT
GUIDEWIRE INQWIRE 1.5J.035X260 (WIRE) IMPLANT
GUIDEWIRE PRESSURE X 175 (WIRE) IMPLANT
INQWIRE 1.5J .035X260CM (WIRE) ×1
KIT ENCORE 26 ADVANTAGE (KITS) IMPLANT
KIT HEART LEFT (KITS) ×2 IMPLANT
PACK CARDIAC CATHETERIZATION (CUSTOM PROCEDURE TRAY) ×2 IMPLANT
SHEATH PROBE COVER 6X72 (BAG) IMPLANT
TRANSDUCER W/STOPCOCK (MISCELLANEOUS) ×2 IMPLANT
TUBING CIL FLEX 10 FLL-RA (TUBING) ×2 IMPLANT
VALVE GUARDIAN II ~~LOC~~ HEMO (MISCELLANEOUS) IMPLANT
WIRE RUNTHROUGH .014X180CM (WIRE) IMPLANT

## 2022-05-23 NOTE — H&P (Signed)
History and Physical    Patient: [name] [id number] DOB: October 01, 1955 DOA: 05/23/2022 DOS: the patient was seen and examined on 05/23/2022 PCP: Andria Frames, PA-C  Patient coming from: Home  Chief Complaint:  Chief Complaint  Patient presents with   Chest Pain   HPI: [name] is a 67 y.o. male with medical history significant of for but not limited to arthritis, history of bladder disorder and history of prostate cancer and CML in remission, history of CAD status post stenting to the right circumflex and RCA, history of diverticulosis, hyperlipidemia, hypertension history of prior MI as well as other comorbidities who presents to the emergency room for evaluation for chest pain.  Patient states that he has had chest discomfort and pain for last 4 months but is progressively gotten worse.  Initially thought to be attributed to GERD.  Denies any nausea, vomiting, lightheadedness or dizziness or any diaphoresis but states that he has had midsternal chest discomfort that radiates across his chest from shoulder to shoulder.  He was recently treated for reflux and evaluated by his GI physician who wanted to perform an endoscopy.  He has been taking his medications without any improvement and associated nausea associated worsening chest discomfort this morning.  States the pain does not radiate to his back or abdomen and pain was not improved with sublingual nitrogen.  Given his symptoms he sought care and wanted to get evaluated and came to the ED.  Is evaluated at med center [place] and given his elevated troponins he was transferred to [facility] for further evaluation by the cardiology team.  Cardiology has evaluated him and are planning on taking him for cardiac cath later today given concern for NSTEMI.  He has been placed on a heparin drip and he remains n.p.o.   Review of Systems: As mentioned in the history of present illness. All other systems reviewed and are  negative. Past Medical History:  Diagnosis Date   Arthritis    Bladder disorder    Cancer ([place])    hx prostate cancer and CML   Chronic myelocytic leukemia ([place])    Coronary artery disease    with prior MI 2002(BMS) and Cfx(2006) and RCA (2004) stents . EF 45-50%   Coronary atherosclerosis of native coronary artery    stable and suspect a component of CAS   Diverticulosis    seen on colonoscopy in 2008   Goals of care, counseling/discussion 09/13/2016   H/O colonoscopy 2014   Hyperlipidemia    Hypertension    Myocardial infarction [facility]) 02,6/12   Prostate cancer ([place])    Past Surgical History:  Procedure Laterality Date   CARDIAC CATHETERIZATION     normal EF, patent stents without obstructive disease (on Plavix X 1 month only)   CAROTID STENT     FRACTURE SURGERY     rt wrist   LEFT HEART CATHETERIZATION WITH CORONARY ANGIOGRAM N/A 02/04/2012   Procedure: LEFT HEART CATHETERIZATION WITH CORONARY ANGIOGRAM;  Surgeon: Sinclair Grooms, MD;  Location: [facility] CATH LAB;  Service: Cardiovascular;  Laterality: N/A;   ORIF ANKLE FRACTURE  07/04/2011   Procedure: OPEN REDUCTION INTERNAL FIXATION (ORIF) ANKLE FRACTURE;  Surgeon: Alta Corning, MD;  Location: [facility];  Service: Orthopedics;  Laterality: Right;   TOE SURGERY     rt great toe   TONSILLECTOMY     WRIST SURGERY     rt and lt cysts removed   Social History:  reports  that he quit smoking about 21 years ago. His smoking use included cigarettes. He has never used smokeless tobacco. He reports that he does not drink alcohol and does not use drugs.  Patient uses recreational marijuana and has been using it since the age of 35  Allergies  Allergen Reactions   Contrast Media [Iodinated Contrast Media] Hives    "a few hives; not a lot"    Family History  Problem Relation Age of Onset   Cancer Mother        Liver  Father has diabetes  Prior to Admission medications   Medication Sig Start Date End Date Taking?  Authorizing Provider  Ascorbic Acid (VITAMIN C) 1000 MG tablet Take 1,000 mg by mouth daily.   Yes [provider]  aspirin 81 MG chewable tablet Chew 162 mg by mouth daily.    Yes [provider]  cholecalciferol (VITAMIN D) 1000 UNITS tablet Take 5,000 Units by mouth daily.    Yes [provider]  Coenzyme Q10 (COQ10) 100 MG CAPS Take 100 mg by mouth daily.   Yes [provider]  Cyanocobalamin (VITAMIN B 12 PO) Take by mouth.   Yes [provider]  cyclobenzaprine (FLEXERIL) 10 MG tablet Take by mouth.   Yes [provider]  darifenacin (ENABLEX) 15 MG 24 hr tablet Take 15 mg by mouth daily.    Yes [provider]  DEXILANT 60 MG capsule TAKE 1 CAPSULE BY MOUTH ONCE DAILY 01/03/18  Yes [name], MD  ezetimibe (ZETIA) 10 MG tablet Take 10 mg by mouth daily. 01/16/22  Yes [provider]  famotidine (PEPCID) 20 MG tablet Take 20 mg by mouth 2 (two) times daily. 05/22/22 05/22/23 Yes [provider]  hydrOXYzine (ATARAX/VISTARIL) 25 MG tablet Take 1 tablet (25 mg total) by mouth 2 (two) times daily. 12/05/17  Yes [name], MD  isosorbide mononitrate (IMDUR) 60 MG 24 hr tablet TAKE 1 TABLET BY MOUTH ONCE DAILY 05/27/17  Yes Belva Crome, MD  metoprolol succinate (TOPROL-XL) 100 MG 24 hr tablet TAKE 1 TABLET BY MOUTH WITH MEALS OR IMMEDIATELY FOLLOWING 05/27/17  Yes Belva Crome, MD  MYRBETRIQ 50 MG TB24 tablet Take 50 mg by mouth daily. 12/25/21  Yes [provider]  nilotinib (TASIGNA) 200 MG capsule Take 400 mg by mouth daily. 09/22/21 09/22/22 Yes [provider]  olmesartan (BENICAR) 20 MG tablet Take 1 tablet (20 mg total) by mouth daily. 05/06/17  Yes Belva Crome, MD  Omega-3 Fatty Acids (FISH OIL PO) Take by mouth.   Yes [provider]  OZEMPIC, 0.25 OR 0.5 MG/DOSE, 2 MG/3ML SOPN Inject 0.5 mg into the skin once a week. 12/13/21  Yes [provider]  rosuvastatin  (CRESTOR) 20 MG tablet TAKE 1 TABLET BY MOUTH ONCE DAILY 05/27/17  Yes Belva Crome, MD  sucralfate (CARAFATE) 1 GM/10ML suspension Take 10 mLs by mouth 4 (four) times daily. 05/11/22  Yes [provider]  VITAMIN E PO Take 1 tablet by mouth daily.   Yes [provider]  nitroGLYCERIN (NITROLINGUAL) 0.4 MG/SPRAY spray Place 1 spray under the tongue every 5 (five) minutes x 3 doses as needed for chest pain. 04/13/16   Belva Crome, MD   Physical Exam: Vitals:   05/23/22 1259 05/23/22 1300 05/23/22 1330 05/23/22 1457  BP:  (!) 140/103 (!) 143/110 (!) 136/104  Pulse:  91 93 93  Resp:  18 (!) 23 20  Temp: 98.2 F (  36.8 C)   97.8 F (36.6 C)  TempSrc: Oral   Oral  SpO2:  94% 99% 99%  Weight:    76 kg  Height:    5\' 7"  (1.702 m)   Examination: Physical Exam:  Constitutional: WN/WD overweight African-American male in no acute distress appears calm but slightly uncomfortable complaining of some chest discomfort Respiratory: Diminished to auscultation bilaterally with some coarse breath sounds, no wheezing, rales, rhonchi or crackles. Normal respiratory effort and patient is not tachypenic. No accessory muscle use.  Unlabored breathing and not wearing any supplemental oxygen nasal cannula Cardiovascular: RRR, no murmurs / rubs / gallops. S1 and S2 auscultated. No extremity edema.  Abdomen: Soft, non-tender, distended secondary to body habitus. Bowel sounds positive.  GU: Deferred. Musculoskeletal: No clubbing / cyanosis of digits/nails. No joint deformity upper and lower extremities.  Skin: No rashes, lesions, ulcers on limited skin evaluation. No induration; Warm and dry.  Neurologic: CN 2-12 grossly intact with no focal deficits. Romberg sign and cerebellar reflexes not assessed.  Psychiatric: Normal judgment and insight. Alert and oriented x 3. Normal mood and appropriate affect.   Data Reviewed: Recent Results (from the past 2160 hour(s))  Basic metabolic panel      Status: None   Collection Time: 05/23/22 10:48 AM  Result Value Ref Range   Sodium 140 135 - 145 mmol/L   Potassium 3.9 3.5 - 5.1 mmol/L   Chloride 106 98 - 111 mmol/L   CO2 29 22 - 32 mmol/L   Glucose, Bld 96 70 - 99 mg/dL    Comment: Glucose reference range applies only to samples taken after fasting for at least 8 hours.   BUN 13 8 - 23 mg/dL   Creatinine, Ser 1.12 0.61 - 1.24 mg/dL   Calcium 9.6 8.9 - 10.3 mg/dL   GFR, Estimated >60 >60 mL/min    Comment: (NOTE) Calculated using the CKD-EPI Creatinine Equation (2021)    Anion gap 5 5 - 15    Comment: Performed at [facility], [address] [place], [place] [address]  CBC     Status: None   Collection Time: 05/23/22 10:48 AM  Result Value Ref Range   WBC 9.7 4.0 - 10.5 K/uL   RBC 4.95 4.22 - 5.81 MIL/uL   Hemoglobin 15.8 13.0 - 17.0 g/dL   HCT 44.7 39.0 - 52.0 %   MCV 90.3 80.0 - 100.0 fL   MCH 31.9 26.0 - 34.0 pg   MCHC 35.3 30.0 - 36.0 g/dL   RDW 13.3 11.5 - 15.5 %   Platelets 230 150 - 400 K/uL   nRBC 0.0 0.0 - 0.2 %    Comment: Performed at [facility], [address], [place], [place] [address]  Troponin I (High Sensitivity)     Status: Abnormal   Collection Time: 05/23/22 10:48 AM  Result Value Ref Range   Troponin I (High Sensitivity) 496 (HH) <18 ng/L    Comment: CRITICAL RESULT CALLED TO, READ BACK BY AND VERIFIED WITH: VENEGAS,L, RN @ 8299 05/23/22 BY GWYN,P (NOTE) Elevated high sensitivity troponin I (hsTnI) values and significant  changes across serial measurements may suggest ACS but many other  chronic and acute conditions are known to elevate hsTnI results.  Refer to the Links section for chest pain algorithms and additional  guidance. Performed at [facility], [place], [place] [address]   Troponin I (High Sensitivity)     Status: Abnormal   Collection Time: 05/23/22  12:48 PM  Result Value Ref Range   Troponin I (High  Sensitivity) 881 (HH) <18 ng/L    Comment: CRITICAL RESULT CALLED TO, READ BACK BY AND VERIFIED WITH: DOSS,M, RN @ 1346 05/23/22 BY GWYN,P (NOTE) Elevated high sensitivity troponin I (hsTnI) values and significant  changes across serial measurements may suggest ACS but many other  chronic and acute conditions are known to elevate hsTnI results.  Refer to the Links section for chest pain algorithms and additional  guidance. Performed at [facility], [address], [place], [place] [address]    EKG: Showed a sinus rhythm with a rate of 82 and a QTc of 404.  He had borderline T wave abnormalities in inferior leads no evidence of ST elevation or depression on my interpretation.  Assessment and Plan: No notes have been filed under this hospital service. Service: Hospitalist  NSTEMI in a patient with a Hx of CAD status post stenting -Presents with Chest Pain  -Troponin went from 496 -> 881 and will continue to monitor and trend -DG Chest X-Ray done and showed "No active cardiopulmonary disease." -Check ECHOCardiogram -Given aspirin 324 mg p.o. once in the ED as well as nitroglycerin topical 1 inch 15 mg for chest discomfort -Aspirin 81 mg in the a.m. as well as metoprolol succinate 100 g p.o. daily to resume in the a.m. -Getting IV Solu-Medrol as well as diphenhydramine given his contrast allergy -Initiated on heparin drip and we will continue -Have notified cardiology for further evaluation and management planning a cardiac cath later on today -Nitroglycerin 1 spray sublingual every 5 minutes as needed for chest pain -Continue supportive care with ondansetron 4 mg IV every 6 as needed nausea   HLD -C/w Rosuvastatin 20 mg p.o. daily and ezetimibe 10 mg  Essential HTN -Continue with blood pressure monitoring per protocol and resume home metoprolol succinate in the a.m. but hold olmesartan for now given that he is going for cardiac cath  History of prostate cancer  and history of CML in remission -Outpatient follow-up -Hold Nilotinib for now and resume in the AM  History of bladder disorder -Continue with darifenacin 15 mg p.o. daily as well as mirabegron 50 mg p.o. daily  Overweight -Complicates overall prognosis and care -Estimated body mass index is 26.25 kg/m as calculated from the following:   Height as of this encounter: 5\' 7"  (1.702 m).   Weight as of this encounter: 76 kg.  -Weight Loss and Dietary Counseling given  GERD/GI prophylaxis -Continue with Dexilant equivalent with pantoprazole -Also continue with famotidine  Advance Care Planning:   Code Status: Prior full code  Consults: Cardiology   Family Communication: No family at bedside  Severity of Illness: The appropriate patient status for this patient is OBSERVATION. Observation status is judged to be reasonable and necessary in order to provide the required intensity of service to ensure the patient's safety. The patient's presenting symptoms, physical exam findings, and initial radiographic and laboratory data in the context of their medical condition is felt to place them at decreased risk for further clinical deterioration. Furthermore, it is anticipated that the patient will be medically stable for discharge from the hospital within 2 midnights of admission.   Author: Raiford Noble, DO 05/23/2022 3:05 PM  For on call review [email or web address].

## 2022-05-23 NOTE — Consult Note (Addendum)
Cardiology Consultation   Patient ID: [name] MRN: [id number]; DOB: 05-15-1955  Admit date: 05/23/2022 Date of Consult: 05/23/2022  PCP:  Osborne, Tirrany T, [facility] Cardiologist:  Sinclair Grooms, MD (Inactive)        Patient Profile:   [name] is a 67 y.o. male with a hx of CAD s/p PTCA to [place] in 2002 with repeat stenosis x 2 requiring balloon angioplasty, chronic myleoid leukemia, HLD, HTN, T2DM,  remote prostate cancer who is being seen 05/23/2022 for the evaluation of NSTEMI at the request of Dr. Alfredia Ferguson.  History of Present Illness:   Mr. [name] has cardiac history for CAD with BMS stenting to ramus followed by overlapping stent with DES to Ramus in 2004, DES to RCA 2006 with evidence of old nonviable lateral infarct on echocardiogram with EF 50 to 55% and no ischemia on July 2021 stress echo. Per patient last known cath was in 2013 with no intervention. Unable to view any cath results directly however last cardio note on 2023 stated his previous cath results showed patent stents. Seen annually for CAD most recently through [facility] and possibly seeking to reestablish care through [facility].   Patient initially presented to the [facility] emergency department on 05/23/2022 with complaints of worsening 7/10 chest heaviness that has been radiating to both shoulder and some accompanied nausea. Patient states that this has been going on for the past 4 months every night and morning without known instigator. Often times this is occurs at rest and not related to exertion. Yesterday, he was also evaluated by GI who wanted perform an endoscopy, but wanted cardiac clearance first.   Loaded on aspirin 324mg  and nitropaste with relief. Sees cardio annually.  ED workup: EKG showed nonspecific T wave changes however same when compared to prior studies.  Troponins are trending up and elevated over 800+.  He has been started on heparin, loaded with aspirin  81mg  x4 and given Nitropaste.  All labs and vital signs stable otherwise.  Chest x-ray negative. Patient has slight chest pain now and rates it 3-4/10.     Past Medical History:  Diagnosis Date   Arthritis    Bladder disorder    Cancer ([place])    hx prostate cancer and CML   Chronic myelocytic leukemia ([place])    Coronary artery disease    with prior MI 2002(BMS) and Cfx(2006) and RCA (2004) stents . EF 45-50%   Coronary atherosclerosis of native coronary artery    stable and suspect a component of CAS   Diverticulosis    seen on colonoscopy in 2008   Goals of care, counseling/discussion 09/13/2016   H/O colonoscopy 2014   Hyperlipidemia    Hypertension    Myocardial infarction [facility]) 02,6/12   Prostate cancer ([place])     Past Surgical History:  Procedure Laterality Date   CARDIAC CATHETERIZATION     normal EF, patent stents without obstructive disease (on Plavix X 1 month only)   CAROTID STENT     FRACTURE SURGERY     rt wrist   LEFT HEART CATHETERIZATION WITH CORONARY ANGIOGRAM N/A 02/04/2012   Procedure: LEFT HEART CATHETERIZATION WITH CORONARY ANGIOGRAM;  Surgeon: Sinclair Grooms, MD;  Location: [facility] CATH LAB;  Service: Cardiovascular;  Laterality: N/A;   ORIF ANKLE FRACTURE  07/04/2011   Procedure: OPEN REDUCTION INTERNAL FIXATION (ORIF) ANKLE FRACTURE;  Surgeon: Alta Corning, MD;  Location: [facility];  Service:  Orthopedics;  Laterality: Right;   TOE SURGERY     rt great toe   TONSILLECTOMY     WRIST SURGERY     rt and lt cysts removed    Inpatient Medications: Scheduled Meds:  [START ON 05/24/2022] aspirin  81 mg Oral Daily   [START ON 05/24/2022] aspirin  81 mg Oral Pre-Cath   [START ON 05/24/2022] darifenacin  15 mg Oral Daily   [START ON 05/24/2022] ezetimibe  10 mg Oral Daily   famotidine  20 mg Oral BID   [START ON 05/24/2022] isosorbide mononitrate  60 mg Oral Daily   [START ON 05/24/2022] metoprolol succinate  100 mg Oral Daily   [START ON 05/24/2022]  mirabegron ER  50 mg Oral Daily   [START ON 05/24/2022] pantoprazole  40 mg Oral Daily   [START ON 05/24/2022] rosuvastatin  20 mg Oral Daily   sodium chloride flush  3 mL Intravenous Q12H   Continuous Infusions:  sodium chloride     sodium chloride     Followed by   sodium chloride     heparin 900 Units/hr (05/23/22 1230)   PRN Meds:   Allergies:    Allergies  Allergen Reactions   Contrast Media [Iodinated Contrast Media] Hives    "a few hives; not a lot"    Social History:   Social History   Socioeconomic History   Marital status: Divorced    Spouse name: Not on file   Number of children: Not on file   Years of education: Not on file   Highest education level: Not on file  Occupational History   Not on file  Tobacco Use   Smoking status: Former    Types: Cigarettes    Quit date: 06/06/2000    Years since quitting: 21.9   Smokeless tobacco: Never   Tobacco comments:    2002  Vaping Use   Vaping Use: Never used  Substance and Sexual Activity   Alcohol use: No    Comment: rare   Drug use: No   Sexual activity: Not on file  Other Topics Concern   Not on file  Social History Narrative   Not on file   Social Determinants of Health   Financial Resource Strain: Not on file  Food Insecurity: Not on file  Transportation Needs: Not on file  Physical Activity: Not on file  Stress: Not on file  Social Connections: Not on file  Intimate Partner Violence: Not on file    Family History:   Family History  Problem Relation Age of Onset   Cancer Mother        Liver     ROS:  Please see the history of present illness.  All other ROS reviewed and negative.     Physical Exam/Data:   Vitals:   05/23/22 1259 05/23/22 1300 05/23/22 1330 05/23/22 1457  BP:  (!) 140/103 (!) 143/110 (!) 136/104  Pulse:  91 93 93  Resp:  18 (!) 23 20  Temp: 98.2 F (36.8 C)   97.8 F (36.6 C)  TempSrc: Oral   Oral  SpO2:  94% 99% 99%  Weight:    76 kg  Height:    5\' 7"  (1.702  m)   No intake or output data in the 24 hours ending 05/23/22 1626    05/23/2022    2:57 PM 01/29/2018    8:54 AM 12/19/2017   10:24 AM  Last 3 Weights  Weight (lbs) 167 lb 9.6 oz 187 lb  188 lb  Weight (kg) 76.023 kg 84.823 kg 85.276 kg     Body mass index is 26.25 kg/m.  General:  Well nourished, well developed, in no acute distress HEENT: normal Neck: no JVD Vascular: No carotid bruits; Distal pulses 2+ bilaterally Cardiac:  normal S1, S2; RRR; no murmur  Lungs:  clear to auscultation bilaterally, no wheezing, rhonchi or rales  Abd: soft, nontender, no hepatomegaly  Ext: no edema Musculoskeletal:  No deformities, BUE and BLE strength normal and equal Skin: warm and dry  Neuro:  CNs 2-12 intact, no focal abnormalities noted Psych:  Normal affect   EKG:  The EKG was personally reviewed and demonstrates:  NSR with some T wave changes in inferior leads. Same when compared to prior.  Telemetry:  Telemetry was personally reviewed and demonstrates:  NSR rate of 70-80  Relevant CV Studies: Exercise tolerance test 05/02/2016 Blood pressure demonstrated a normal response to exercise. Horizontal ST segment depression ST segment depression of 1 mm was noted during stress in the II and III leads, and returning to baseline after less than 1 minute of recovery. Baseline T wave inversion and flattening makes the ST depression less specific. Equivocal study.    Laboratory Data:  High Sensitivity Troponin:   Recent Labs  Lab 05/23/22 1048 05/23/22 1248  TROPONINIHS 496* 881*     Chemistry Recent Labs  Lab 05/23/22 1048  NA 140  K 3.9  CL 106  CO2 29  GLUCOSE 96  BUN 13  CREATININE 1.12  CALCIUM 9.6  GFRNONAA >60  ANIONGAP 5    No results for input(s): "PROT", "ALBUMIN", "AST", "ALT", "ALKPHOS", "BILITOT" in the last 168 hours. Lipids No results for input(s): "CHOL", "TRIG", "HDL", "LABVLDL", "LDLCALC", "CHOLHDL" in the last 168 hours.  Hematology Recent Labs  Lab  05/23/22 1048  WBC 9.7  RBC 4.95  HGB 15.8  HCT 44.7  MCV 90.3  MCH 31.9  MCHC 35.3  RDW 13.3  PLT 230   Thyroid No results for input(s): "TSH", "FREET4" in the last 168 hours.  BNPNo results for input(s): "BNP", "PROBNP" in the last 168 hours.  DDimer No results for input(s): "DDIMER" in the last 168 hours.   Radiology/Studies:  DG Chest 2 View  Result Date: 05/23/2022 CLINICAL DATA:  Midsternal chest pain with nausea for 4 months. EXAM: CHEST - 2 VIEW COMPARISON:  Chest radiographs 02/01/2016 FINDINGS: Cardiac silhouette and mediastinal contours are within normal limits. The lungs are clear. No pleural effusion or pneumothorax. Minimal multilevel degenerative disc changes of the thoracic spine. IMPRESSION: No active cardiopulmonary disease. Electronically Signed   By: Yvonne Kendall M.D.   On: 05/23/2022 11:59     Assessment and Plan:   NSTEMI CAD s/p to ramus, Lcx, RCA [id number]) Patient with ongoing chest pain x 4 months now presenting with worsening symptoms.  Troponins trending up, currently 800+. Heparin has been started Plan for nonemergent catheterization. Given solu-Medrol as well as diphenhydramine given his contrast allergy  Has been loaded on aspirin 324mg  and Nitropaste with some relief of symptoms On aspirin 81mg  daily and imdur 60mg  daily , rosuvastatin 20mg  daily, zetia 10mg  daily Followup on echo results  Nitro PRN  Shared Decision Making/Informed Consent The risks [stroke (1 in 1000), death (1 in 1000), kidney failure [usually temporary] (1 in 500), bleeding (1 in 200), allergic reaction [possibly serious] (1 in 200)], benefits (diagnostic support and management of coronary artery disease) and alternatives of a cardiac catheterization were discussed in detail with  Mr. Elden and he is willing to proceed.   HTN Home medication includes metoprolol 100mg  daily, olmesartan 20mg  daily   OSA Deferred treatment from previous notes   DM Check A1c   Risk  Assessment/Risk Scores:  TIMI Risk Score for Unstable Angina or Non-ST Elevation MI:   The patient's TIMI risk score is 6, which indicates a 41% risk of all cause mortality, new or recurrent myocardial infarction or need for urgent revascularization in the next 14 days.{    For questions or updates, please contact [facility] Please consult [email or web address] for contact info under    Signed, Bonnee Quin, PA-C  05/23/2022 4:26 PM   I have examined the patient and reviewed assessment and plan and discussed with patient.  Agree with above as stated.    Prior CAD.  Symptoms concerning for unstable angina.  Troponin mildly elevated and increasing on subsequent check.  Plan for cardiac cath to define coronary anatomy.  The patient understands that risks include but are not limited to stroke (1 in 1000), death (1 in 47), kidney failure [usually temporary] (1 in 500), bleeding (1 in 200), allergic reaction [possibly serious] (1 in 200), and agrees to proceed.    Larae Grooms

## 2022-05-23 NOTE — ED Provider Notes (Signed)
[name] Provider Note   CSN: [id number] Arrival date & time: 05/23/22  [id number]     History  Chief Complaint  Patient presents with   Chest Pain    [name] is a 67 y.o. male.  HPI   67 year old male with past medical history of CAD, previous MI in 2002 with PCI presents to the emergency department with chest pain.  Patient states for the past 4 months, every night and morning he experiences heaviness along his anterior chest that extends from shoulder to shoulder.  He has been treated for reflux, saw a GI doctor yesterday who wants to perform an endoscopy.  States has been taking the new medications without any improvement.  Presents today with most severe episode this morning.  Denies any associated shortness of breath.  Pain does not radiate to his back or abdomen.  Pain not improved by sublingual nitroglycerin today.  No other recent fever or illness.  Home Medications Prior to Admission medications   Medication Sig Start Date End Date Taking? Authorizing Provider  MYRBETRIQ 50 MG TB24 tablet Take 50 mg by mouth daily. 12/25/21  Yes [provider]  OZEMPIC, 0.25 OR 0.5 MG/DOSE, 2 MG/3ML SOPN Inject 0.5 mg into the skin once a week. 12/13/21  Yes [provider]  sucralfate (CARAFATE) 1 GM/10ML suspension Take 10 mLs by mouth 4 (four) times daily. 05/11/22  Yes [provider]  Ascorbic Acid (VITAMIN C) 1000 MG tablet Take 1,000 mg by mouth daily.    [provider]  aspirin 81 MG chewable tablet Chew 162 mg by mouth daily.     [provider]  cholecalciferol (VITAMIN D) 1000 UNITS tablet Take 5,000 Units by mouth daily.     [provider]  Coenzyme Q10 (COQ10) 100 MG CAPS Take 100 mg by mouth daily.    [provider]  cyclobenzaprine (FLEXERIL) 10 MG tablet Take by mouth.    [provider]  darifenacin (ENABLEX) 15 MG 24 hr tablet Take 15 mg by mouth  daily.     [provider]  DEXILANT 60 MG capsule TAKE 1 CAPSULE BY MOUTH ONCE DAILY 01/03/18   [name], MD  hydrOXYzine (ATARAX/VISTARIL) 25 MG tablet Take 1 tablet (25 mg total) by mouth 2 (two) times daily. 12/05/17   [name], MD  isosorbide mononitrate (IMDUR) 60 MG 24 hr tablet TAKE 1 TABLET BY MOUTH ONCE DAILY 05/27/17   Belva Crome, MD  magnesium oxide (MAG-OX) 400 MG tablet Take 400 mg by mouth daily as needed (cramps).    [provider]  metoprolol succinate (TOPROL-XL) 100 MG 24 hr tablet TAKE 1 TABLET BY MOUTH WITH MEALS OR IMMEDIATELY FOLLOWING 05/27/17   Belva Crome, MD  nitroGLYCERIN (NITROLINGUAL) 0.4 MG/SPRAY spray Place 1 spray under the tongue every 5 (five) minutes x 3 doses as needed for chest pain. 04/13/16   Belva Crome, MD  olmesartan (BENICAR) 20 MG tablet Take 1 tablet (20 mg total) by mouth daily. 05/06/17   Belva Crome, MD  PROAIR HFA 108 [phone] Base) MCG/ACT inhaler Inhale 2 puffs into the lungs every 6 (six) hours as needed for wheezing (cough).  01/23/17   [provider]  rosuvastatin (CRESTOR) 20 MG tablet TAKE 1 TABLET BY MOUTH ONCE DAILY 05/27/17   Belva Crome, MD      Allergies    Contrast media [iodinated contrast media]    Review of Systems  Review of Systems  Constitutional:  Positive for fatigue. Negative for fever.  Respiratory:  Positive for chest tightness. Negative for shortness of breath.   Cardiovascular:  Positive for chest pain. Negative for palpitations and leg swelling.  Gastrointestinal:  Negative for abdominal pain, diarrhea and vomiting.  Genitourinary:  Negative for flank pain.  Musculoskeletal:  Negative for back pain.  Skin:  Negative for rash.  Neurological:  Negative for headaches.    Physical Exam Updated Vital Signs BP (!) 146/114   Pulse 99   Temp 98.5 F (36.9 C) (Oral)   Resp 20   SpO2 100%  Physical Exam Vitals and nursing note reviewed.  Constitutional:      General: He  is not in acute distress.    Appearance: Normal appearance.  HENT:     Head: Normocephalic.     Mouth/Throat:     Mouth: Mucous membranes are moist.  Cardiovascular:     Rate and Rhythm: Normal rate.  Pulmonary:     Effort: Pulmonary effort is normal. No respiratory distress.  Abdominal:     Palpations: Abdomen is soft.     Tenderness: There is no abdominal tenderness.  Musculoskeletal:     Right lower leg: No edema.     Left lower leg: No edema.  Skin:    General: Skin is warm.  Neurological:     Mental Status: He is alert and oriented to person, place, and time. Mental status is at baseline.  Psychiatric:        Mood and Affect: Mood normal.     ED Results / Procedures / Treatments   Labs (all labs ordered are listed, but only abnormal results are displayed) Labs Reviewed  TROPONIN I (HIGH SENSITIVITY) - Abnormal; Notable for the following components:      Result Value   Troponin I (High Sensitivity) 496 (*)    All other components within normal limits  BASIC METABOLIC PANEL  CBC    EKG EKG Interpretation  Date/Time:  Wednesday May 23 2022 09:19:05 EDT Ventricular Rate:  82 PR Interval:  144 QRS Duration: 101 QT Interval:  346 QTC Calculation: 404 R Axis:   34 Text Interpretation: Sinus rhythm Posterior infarct, old Borderline T abnormalities, inferior leads similar to previous Confirmed by Lavenia Atlas [phone]) on 05/23/2022 11:23:45 AM  Radiology DG Chest 2 View  Result Date: 05/23/2022 CLINICAL DATA:  Midsternal chest pain with nausea for 4 months. EXAM: CHEST - 2 VIEW COMPARISON:  Chest radiographs 02/01/2016 FINDINGS: Cardiac silhouette and mediastinal contours are within normal limits. The lungs are clear. No pleural effusion or pneumothorax. Minimal multilevel degenerative disc changes of the thoracic spine. IMPRESSION: No active cardiopulmonary disease. Electronically Signed   By: Yvonne Kendall M.D.   On: 05/23/2022 11:59    Procedures .Critical  Care  Performed by: Lorelle Gibbs, DO Authorized by: Lorelle Gibbs, DO   Critical care provider statement:    Critical care time (minutes):  30   Critical care time was exclusive of:  Separately billable procedures and treating other patients   Critical care was necessary to treat or prevent imminent or life-threatening deterioration of the following conditions:  Cardiac failure   Critical care was time spent personally by me on the following activities:  Development of treatment plan with patient or surrogate, discussions with consultants, evaluation of patient's response to treatment, examination of patient, ordering and review of laboratory studies, ordering and review of radiographic studies, ordering and performing treatments and interventions,  pulse oximetry, re-evaluation of patient's condition and review of old charts   I assumed direction of critical care for this patient from another provider in my specialty: no     Care discussed with: admitting provider       Medications Ordered in ED Medications  aspirin chewable tablet 324 mg (has no administration in time range)  nitroGLYCERIN (NITROGLYN) 2 % ointment 0.5 inch (has no administration in time range)    ED Course/ Medical Decision Making/ A&P                             Medical Decision Making Amount and/or Complexity of Data Reviewed Labs: ordered. Radiology: ordered.  Risk OTC drugs. Prescription drug management. Decision regarding hospitalization.   67 year old male presents emergency department with anterior chest heaviness mainly at night and worse on waking.  History of cardiac disease, [facility] cardiology, [facility]  Unsure when his last stress test was but a note from cardiology in care everywhere about a year ago he mentioned 2013.  Vitals are stable on arrival.  EKG has some nonspecific T wave changes but is unchanged for the patient.  CBC and BMP are unremarkable however initial  troponin is elevated over 400.  On reevaluation patient has some mild soreness in the anterior chest.  Will plan to place Nitropaste.  Concern for NSTEMI, heparin ordered.  Consulted with on-call cardiologist, Dr. Irish Lack, reviewed patient's presentation and current treatment.  Agree with admission to hospitalist and transferred to [facility], anticipate possible cath.  On reevaluation patient is comfortable, vitals are stable.  Patients evaluation and results requires admission for further treatment and care.  Spoke with hospitalist, reviewed patient's ED course and they accept admission.  Patient agrees with admission plan, offers no new complaints and is stable/unchanged at time of admit.        Final Clinical Impression(s) / ED Diagnoses Final diagnoses:  None    Rx / DC Orders ED Discharge Orders     None         Lorelle Gibbs, DO 05/23/22 1314

## 2022-05-23 NOTE — Progress Notes (Signed)
ANTICOAGULATION CONSULT NOTE - Initial Consult  Pharmacy Consult for heparin Indication: chest pain/ACS  Allergies  Allergen Reactions   Contrast Media [Iodinated Contrast Media] Hives    "a few hives; not a lot"   Patient Measurements: Heparin Dosing Weight: 78 kg  Vital Signs: Temp: 98.5 F (36.9 C) (03/20 0921) Temp Source: Oral (03/20 0921) BP: 146/114 (03/20 1130) Pulse Rate: 99 (03/20 1130)  Labs: Recent Labs    05/23/22 1048  HGB 15.8  HCT 44.7  PLT 230  CREATININE 1.12  TROPONINIHS 496*   CrCl cannot be calculated (Unknown ideal weight.).  Medical History: Past Medical History:  Diagnosis Date   Arthritis    Bladder disorder    Cancer ([employer])    hx prostate cancer and CML   Chronic myelocytic leukemia ([place])    Coronary artery disease    with prior MI 2002([facility]) and Cfx(2006) and RCA (2004) stents . EF 45-50%   Coronary atherosclerosis of native coronary artery    stable and suspect a component of CAS   Diverticulosis    seen on colonoscopy in 2008   Goals of care, counseling/discussion 09/13/2016   H/O colonoscopy 2014   Hyperlipidemia    Hypertension    Myocardial infarction ([place]) 02,6/12   Prostate cancer ([place])    Medications:  Infusions:   heparin     Assessment: RC is a 67 yo male admitted with chest pain with troponin of 496. PMH of CAD. No anticoagulation prior to admission. Scr 1.12 and at baseline. Hgb 15.8 and platelets 230.  Goal of Therapy:  Heparin level 0.3-0.7 units/ml Monitor platelets by anticoagulation protocol: Yes   Plan:  Heparin 4000 unit bolus x1 Start heparin 900 units/hr. Check 6 heparin level.  Daily CBC, heparin level. Monitor for signs/symptoms of bleeding.  Rhae Lerner Kaylynn Chamblin [id number] PM

## 2022-05-23 NOTE — ED Notes (Signed)
Report given to the Floor RN and [facility]..  

## 2022-05-23 NOTE — ED Triage Notes (Signed)
Pt reports mid sternal chest pain with nausea x 4 months. Pt denies SHOB.

## 2022-05-23 NOTE — Progress Notes (Addendum)
Dr's [place], [place] and [place] updated via secure chat. Troponin level  at 1275  just called in by Lab. Just FYI

## 2022-05-23 NOTE — ED Notes (Signed)
Date and time results received: 05/23/22 1346 (use smartphrase ".now" to insert current time)  Test: trop Critical Value: 881  Name of Provider Notified: Dr. Dina Rich  Orders Received? Or Actions Taken?: see chart

## 2022-05-23 NOTE — ED Notes (Signed)
[facility] called -- discussed what was needed for transport; Bed Assignment Ready

## 2022-05-23 NOTE — ED Notes (Signed)
Continuing to page the on call Cardiologist; DO hasn't received a call back yet

## 2022-05-24 ENCOUNTER — Encounter (HOSPITAL_COMMUNITY): Payer: Self-pay | Admitting: Interventional Cardiology

## 2022-05-24 ENCOUNTER — Observation Stay (HOSPITAL_COMMUNITY): Payer: PPO

## 2022-05-24 ENCOUNTER — Other Ambulatory Visit (HOSPITAL_COMMUNITY): Payer: Self-pay

## 2022-05-24 DIAGNOSIS — T82855A Stenosis of coronary artery stent, initial encounter: Secondary | ICD-10-CM | POA: Diagnosis present

## 2022-05-24 DIAGNOSIS — E119 Type 2 diabetes mellitus without complications: Secondary | ICD-10-CM | POA: Diagnosis present

## 2022-05-24 DIAGNOSIS — C9210 Chronic myeloid leukemia, BCR/ABL-positive, not having achieved remission: Secondary | ICD-10-CM | POA: Diagnosis not present

## 2022-05-24 DIAGNOSIS — N529 Male erectile dysfunction, unspecified: Secondary | ICD-10-CM | POA: Diagnosis present

## 2022-05-24 DIAGNOSIS — Z87891 Personal history of nicotine dependence: Secondary | ICD-10-CM | POA: Diagnosis not present

## 2022-05-24 DIAGNOSIS — R079 Chest pain, unspecified: Secondary | ICD-10-CM | POA: Diagnosis not present

## 2022-05-24 DIAGNOSIS — Z91041 Radiographic dye allergy status: Secondary | ICD-10-CM | POA: Diagnosis not present

## 2022-05-24 DIAGNOSIS — I10 Essential (primary) hypertension: Secondary | ICD-10-CM

## 2022-05-24 DIAGNOSIS — Z6826 Body mass index (BMI) 26.0-26.9, adult: Secondary | ICD-10-CM | POA: Diagnosis not present

## 2022-05-24 DIAGNOSIS — E782 Mixed hyperlipidemia: Secondary | ICD-10-CM

## 2022-05-24 DIAGNOSIS — G4733 Obstructive sleep apnea (adult) (pediatric): Secondary | ICD-10-CM | POA: Diagnosis present

## 2022-05-24 DIAGNOSIS — Z8546 Personal history of malignant neoplasm of prostate: Secondary | ICD-10-CM | POA: Diagnosis not present

## 2022-05-24 DIAGNOSIS — Z833 Family history of diabetes mellitus: Secondary | ICD-10-CM | POA: Diagnosis not present

## 2022-05-24 DIAGNOSIS — E663 Overweight: Secondary | ICD-10-CM | POA: Diagnosis present

## 2022-05-24 DIAGNOSIS — Z79899 Other long term (current) drug therapy: Secondary | ICD-10-CM | POA: Diagnosis not present

## 2022-05-24 DIAGNOSIS — N329 Bladder disorder, unspecified: Secondary | ICD-10-CM

## 2022-05-24 DIAGNOSIS — C9211 Chronic myeloid leukemia, BCR/ABL-positive, in remission: Secondary | ICD-10-CM | POA: Diagnosis present

## 2022-05-24 DIAGNOSIS — Z7985 Long-term (current) use of injectable non-insulin antidiabetic drugs: Secondary | ICD-10-CM | POA: Diagnosis not present

## 2022-05-24 DIAGNOSIS — Y831 Surgical operation with implant of artificial internal device as the cause of abnormal reaction of the patient, or of later complication, without mention of misadventure at the time of the procedure: Secondary | ICD-10-CM | POA: Diagnosis present

## 2022-05-24 DIAGNOSIS — I252 Old myocardial infarction: Secondary | ICD-10-CM | POA: Diagnosis not present

## 2022-05-24 DIAGNOSIS — I214 Non-ST elevation (NSTEMI) myocardial infarction: Secondary | ICD-10-CM | POA: Diagnosis present

## 2022-05-24 DIAGNOSIS — I2510 Atherosclerotic heart disease of native coronary artery without angina pectoris: Secondary | ICD-10-CM | POA: Diagnosis present

## 2022-05-24 DIAGNOSIS — K219 Gastro-esophageal reflux disease without esophagitis: Secondary | ICD-10-CM | POA: Diagnosis present

## 2022-05-24 DIAGNOSIS — Z7982 Long term (current) use of aspirin: Secondary | ICD-10-CM | POA: Diagnosis not present

## 2022-05-24 DIAGNOSIS — I25118 Atherosclerotic heart disease of native coronary artery with other forms of angina pectoris: Secondary | ICD-10-CM

## 2022-05-24 DIAGNOSIS — E785 Hyperlipidemia, unspecified: Secondary | ICD-10-CM | POA: Diagnosis present

## 2022-05-24 DIAGNOSIS — F419 Anxiety disorder, unspecified: Secondary | ICD-10-CM | POA: Diagnosis present

## 2022-05-24 LAB — CBC WITH DIFFERENTIAL/PLATELET
Abs Immature Granulocytes: 0.05 10*3/uL (ref 0.00–0.07)
Basophils Absolute: 0 10*3/uL (ref 0.0–0.1)
Basophils Relative: 0 %
Eosinophils Absolute: 0 10*3/uL (ref 0.0–0.5)
Eosinophils Relative: 0 %
HCT: 44 % (ref 39.0–52.0)
Hemoglobin: 15.9 g/dL (ref 13.0–17.0)
Immature Granulocytes: 1 %
Lymphocytes Relative: 13 %
Lymphs Abs: 1 10*3/uL (ref 0.7–4.0)
MCH: 32.1 pg (ref 26.0–34.0)
MCHC: 36.1 g/dL — ABNORMAL HIGH (ref 30.0–36.0)
MCV: 88.9 fL (ref 80.0–100.0)
Monocytes Absolute: 0.1 10*3/uL (ref 0.1–1.0)
Monocytes Relative: 1 %
Neutro Abs: 6.4 10*3/uL (ref 1.7–7.7)
Neutrophils Relative %: 85 %
Platelets: 238 10*3/uL (ref 150–400)
RBC: 4.95 MIL/uL (ref 4.22–5.81)
RDW: 13 % (ref 11.5–15.5)
WBC: 7.6 10*3/uL (ref 4.0–10.5)
nRBC: 0 % (ref 0.0–0.2)

## 2022-05-24 LAB — COMPREHENSIVE METABOLIC PANEL
ALT: 21 U/L (ref 0–44)
AST: 26 U/L (ref 15–41)
Albumin: 3.8 g/dL (ref 3.5–5.0)
Alkaline Phosphatase: 57 U/L (ref 38–126)
Anion gap: 7 (ref 5–15)
BUN: 13 mg/dL (ref 8–23)
CO2: 23 mmol/L (ref 22–32)
Calcium: 9 mg/dL (ref 8.9–10.3)
Chloride: 106 mmol/L (ref 98–111)
Creatinine, Ser: 1.04 mg/dL (ref 0.61–1.24)
GFR, Estimated: >60 mL/min (ref >60)
Glucose, Bld: 124 mg/dL — ABNORMAL HIGH (ref 70–99)
Potassium: 3.9 mmol/L (ref 3.5–5.1)
Sodium: 136 mmol/L (ref 135–145)
Total Bilirubin: 0.8 mg/dL (ref 0.3–1.2)
Total Protein: 6.8 g/dL (ref 6.5–8.1)

## 2022-05-24 LAB — PHOSPHORUS: Phosphorus: 1.3 mg/dL — ABNORMAL LOW (ref 2.5–4.6)

## 2022-05-24 LAB — LIPID PANEL
Cholesterol: 104 mg/dL (ref 0–200)
HDL: 38 mg/dL — ABNORMAL LOW (ref >40)
LDL Cholesterol: 58 mg/dL (ref 0–99)
Total CHOL/HDL Ratio: 2.7 RATIO
Triglycerides: 38 mg/dL (ref <150)
VLDL: 8 mg/dL (ref 0–40)

## 2022-05-24 LAB — HEMOGLOBIN A1C
Hgb A1c MFr Bld: 6.1 % — ABNORMAL HIGH (ref 4.8–5.6)
Mean Plasma Glucose: 128 mg/dL

## 2022-05-24 LAB — ECHOCARDIOGRAM COMPLETE
Area-P 1/2: 4.1 cm2
Calc EF: 43.1 %
Height: 67 in
S' Lateral: 4.2 cm
Single Plane A2C EF: 41.5 %
Single Plane A4C EF: 44.1 %
Weight: 2681.6 oz

## 2022-05-24 LAB — MAGNESIUM: Magnesium: 1.9 mg/dL (ref 1.7–2.4)

## 2022-05-24 LAB — TROPONIN I (HIGH SENSITIVITY): Troponin I (High Sensitivity): 1173 ng/L (ref <18)

## 2022-05-24 MED ORDER — TICAGRELOR 90 MG PO TABS
90.0000 mg | ORAL_TABLET | Freq: Two times a day (BID) | ORAL | 0 refills | Status: DC
  Filled 2022-05-24: qty 60, 30d supply, fill #0

## 2022-05-24 MED ORDER — K PHOS MONO-SOD PHOS DI & MONO 155-852-130 MG PO TABS
500.0000 mg | ORAL_TABLET | Freq: Once | ORAL | Status: AC
  Administered 2022-05-24: 500 mg via ORAL
  Filled 2022-05-24: qty 2

## 2022-05-24 MED ORDER — POTASSIUM PHOSPHATES 15 MMOLE/5ML IV SOLN
30.0000 mmol | Freq: Once | INTRAVENOUS | Status: AC
  Administered 2022-05-24: 30 mmol via INTRAVENOUS
  Filled 2022-05-24: qty 10

## 2022-05-24 MED ORDER — HYDROXYZINE HCL 25 MG PO TABS
25.0000 mg | ORAL_TABLET | Freq: Two times a day (BID) | ORAL | Status: DC | PRN
  Administered 2022-05-24: 25 mg via ORAL
  Filled 2022-05-24: qty 1

## 2022-05-24 MED ORDER — ACETAMINOPHEN 325 MG PO TABS
650.0000 mg | ORAL_TABLET | ORAL | 0 refills | Status: DC | PRN
  Filled 2022-05-24: qty 20, 2d supply, fill #0

## 2022-05-24 MED ORDER — NITROGLYCERIN 0.4 MG SL SUBL
0.4000 mg | SUBLINGUAL_TABLET | SUBLINGUAL | 12 refills | Status: AC | PRN
  Filled 2022-05-24: qty 25, 7d supply, fill #0

## 2022-05-24 MED ORDER — IRBESARTAN 150 MG PO TABS
150.0000 mg | ORAL_TABLET | Freq: Every day | ORAL | Status: DC
  Administered 2022-05-24: 150 mg via ORAL
  Filled 2022-05-24: qty 1

## 2022-05-24 MED ORDER — PERFLUTREN LIPID MICROSPHERE
1.0000 mL | INTRAVENOUS | Status: AC | PRN
  Administered 2022-05-24: 4 mL via INTRAVENOUS

## 2022-05-24 MED ORDER — ASPIRIN 81 MG PO CHEW
81.0000 mg | CHEWABLE_TABLET | Freq: Every day | ORAL | 0 refills | Status: AC
  Filled 2022-05-24: qty 30, 30d supply, fill #0

## 2022-05-24 NOTE — Progress Notes (Signed)
Patient discharged. Pt stable. Taken down by [employer] in wheelchair. A&Ox4. Room air.

## 2022-05-24 NOTE — Progress Notes (Signed)
[facility] night cross cover note:   I was notified by RN of patient's request for resumption of home prn atarax for anxiety. I subsequently resumed this home prn medication at patient's request.   Babs Bertin, DO Hospitalist

## 2022-05-24 NOTE — Progress Notes (Addendum)
Rounding Note    Patient Name: [name] Date of Encounter: 05/24/2022  [place] Cardiologist: Sinclair Grooms, MD (Inactive)   Subjective   Patient tolerated procedure well.  However this morning he started complaining of chest pain that was sharp and nonradiating 10 minutes after he got up to use the bathroom.  He was given 3 doses of sublingual nitro with pain relief.  Patient admits to emotional stress and he became tearful during this event.  States that he has a lot of life stressors currently with work and finances. Inpatient Medications    Scheduled Meds:  aspirin  81 mg Oral Daily   darifenacin  15 mg Oral Daily   ezetimibe  10 mg Oral Daily   famotidine  20 mg Oral BID   isosorbide mononitrate  60 mg Oral Daily   metoprolol succinate  100 mg Oral Daily   mirabegron ER  50 mg Oral Daily   pantoprazole  40 mg Oral Daily   rosuvastatin  20 mg Oral Daily   sodium chloride flush  3 mL Intravenous Q12H   sodium chloride flush  3 mL Intravenous Q12H   ticagrelor  90 mg Oral BID   Continuous Infusions:  sodium chloride     PRN Meds: sodium chloride, acetaminophen, acetaminophen, hydrOXYzine, nitroGLYCERIN, ondansetron (ZOFRAN) IV, ondansetron (ZOFRAN) IV, sodium chloride flush   Vital Signs    Vitals:   05/24/22 0601 05/24/22 0645 05/24/22 0652 05/24/22 0719  BP: (!) 144/106 (!) 179/120 (!) 150/111 (!) 154/113  Pulse: 89 91 84 (!) 104  Resp:  20  17  Temp:    97.6 F (36.4 C)  TempSrc:    Oral  SpO2: 96% 97%  98%  Weight:      Height:        Intake/Output Summary (Last 24 hours) at 05/24/2022 0809 Last data filed at 05/23/2022 2000 Gross per 24 hour  Intake 240 ml  Output --  Net 240 ml      05/23/2022    2:57 PM 01/29/2018    8:54 AM 12/19/2017   10:24 AM  Last 3 Weights  Weight (lbs) 167 lb 9.6 oz 187 lb 188 lb  Weight (kg) 76.023 kg 84.823 kg 85.276 kg      Telemetry    Sinus tachycardia occasional PVCs.  Heart rate of  100.  - Personally Reviewed  ECG    Normal sinus rhythm with a rate of 92.- Personally Reviewed  Physical Exam  GEN: No acute distress.   Neck: No JVD Cardiac: RRR, no murmurs, rubs, or gallops.  Respiratory: Clear to auscultation bilaterally. GI: Soft, nontender, non-distended  MS: No edema; No deformity.  Right radial cath site without signs of infection.  No redness, discharge, tenderness. Neuro:  Nonfocal  Psych: Normal affect   Labs    High Sensitivity Troponin:   Recent Labs  Lab 05/23/22 1048 05/23/22 1248 05/23/22 1613 05/23/22 1817 05/24/22 0147  TROPONINIHS 496* 881* 1,275* 1,586* 1,173*     Chemistry Recent Labs  Lab 05/23/22 1048 05/24/22 0147  NA 140 136  K 3.9 3.9  CL 106 106  CO2 29 23  GLUCOSE 96 124*  BUN 13 13  CREATININE 1.12 1.04  CALCIUM 9.6 9.0  MG  --  1.9  PROT  --  6.8  ALBUMIN  --  3.8  AST  --  26  ALT  --  21  ALKPHOS  --  57  BILITOT  --  0.8  GFRNONAA >60 >60  ANIONGAP 5 7    Lipids  Recent Labs  Lab 05/24/22 0147  CHOL 104  TRIG 38  HDL 38*  LDLCALC 58  CHOLHDL 2.7    Hematology Recent Labs  Lab 05/23/22 1048 05/24/22 0147  WBC 9.7 7.6  RBC 4.95 4.95  HGB 15.8 15.9  HCT 44.7 44.0  MCV 90.3 88.9  MCH 31.9 32.1  MCHC 35.3 36.1*  RDW 13.3 13.0  PLT 230 238   Thyroid No results for input(s): "TSH", "FREET4" in the last 168 hours.  BNPNo results for input(s): "BNP", "PROBNP" in the last 168 hours.  DDimer No results for input(s): "DDIMER" in the last 168 hours.   Radiology    CARDIAC CATHETERIZATION  Result Date: 05/23/2022   Prox RCA lesion is 50% stenosed.  Tortuous portion of the vessel which has progressed since 2013.   Mid LAD lesion is 25% stenosed.   2nd Diag lesion is 25% stenosed.   Prox LAD lesion is 50% stenosed.  RFR 0.95.  This area has progressed since 2013.   Ost Cx to Prox Cx lesion is 90% stenosed.   Scoring balloon angioplasty was performed using a BALLN WOLVERINE 2.50X10.   Post  intervention, there is a 10% residual stenosis.   The left ventricular systolic function is normal.   LV end diastolic pressure is normal.   The left ventricular ejection fraction is 55-65% by visual estimate.   There is no aortic valve stenosis.   Tortuosity noted in the right subclavian.  Somewhat difficult to torque catheters without the benefit of a destination sheath which was not available today. Successful PTCA of the severe in-stent restenosis of the proximal circumflex stent.  Continue with dual antiplatelet therapy for 12 months.  Continue aggressive secondary prevention.  Results conveyed to the patient's son,  Jodi Mourning [id number].  Plan for discharge tomorrow.   DG Chest 2 View  Result Date: 05/23/2022 CLINICAL DATA:  Midsternal chest pain with nausea for 4 months. EXAM: CHEST - 2 VIEW COMPARISON:  Chest radiographs 02/01/2016 FINDINGS: Cardiac silhouette and mediastinal contours are within normal limits. The lungs are clear. No pleural effusion or pneumothorax. Minimal multilevel degenerative disc changes of the thoracic spine. IMPRESSION: No active cardiopulmonary disease. Electronically Signed   By: Yvonne Kendall M.D.   On: 05/23/2022 11:59    Cardiac Studies   Left heart catheterization 05/23/2022   Prox RCA lesion is 50% stenosed.  Tortuous portion of the vessel which has progressed since 2013.   Mid LAD lesion is 25% stenosed.   2nd Diag lesion is 25% stenosed.   Prox LAD lesion is 50% stenosed.  RFR 0.95.  This area has progressed since 2013.   Ost Cx to Prox Cx lesion is 90% stenosed.   Scoring balloon angioplasty was performed using a BALLN WOLVERINE 2.50X10.   Post intervention, there is a 10% residual stenosis.   The left ventricular systolic function is normal.   LV end diastolic pressure is normal.   The left ventricular ejection fraction is 55-65% by visual estimate.   There is no aortic valve stenosis.   Tortuosity noted in the right subclavian.  Somewhat difficult to  torque catheters without the benefit of a destination sheath which was not available today.   Successful PTCA of the severe in-stent restenosis of the proximal circumflex stent.  Continue with dual antiplatelet therapy for 12 months.  Continue aggressive secondary prevention.    Echocardiogram 05/23/2022 Pending   Patient  Profile     67 y.o. male with a hx of CAD s/p PTCA to OM1 in 2002 with repeat stenosis x 2 requiring balloon angioplasty, chronic myleoid leukemia, HLD, HTN, T2DM,  remote prostate cancer who is being seen 05/23/2022 for the evaluation of NSTEMI at the request of Dr. Alfredia Ferguson. Now postop day 1 after undergoing angioplasty for left heart catheterization.  Assessment & Plan    NSTEMI CAD s/p to ramus, Lcx, RCA (2002,2004,2006) Patient postop day 1 from left heart catheterization.  He had successful balloon angioplasty to severe in-stent restenosis of the proximal circumflex stent.  Echocardiogram results still pending however cath noted normal LV function with an EF of 55 to 65%. Overall, patient tolerated procedure well, now having chest pain this morning postoperatively however this appears to be more due to emotional stress.  Repeat EKG unchanged and with no ischemic changes. Now drawing troponins.  Initial plan was to discharge today, however with his chest pain would like to see how he does throughout the day and with cardiac rehab. Discharge on dual antiplatelet therapy with Brilinta and aspirin for 12 months. On aspirin 81mg  daily and imdur 60mg  daily, rosuvastatin 20mg  daily, zetia 10mg  daily.  Patient previously taking Imdur 30 mg daily however going to increase with new onset of chest pain to 60 mg daily. Followup on echo results  Nitro PRN Currently receiving intracavernosal injections for erectile dysfunction.  Patient does not have issues with hypotension and no clear contraindications with this have been found with usage of imdur and nitro.  Checking A1c, lipid panel  for further risk management. Right radial cath site without clear signs of infection.  Hypertension Patient hypertensive with blood pressure 140s over 100 with sinus tachycardia but is typically well-controlled.  Suspect this is more attributed to his emotional stress rather than postoperative cardiac complication.  Also could be contributing to his chest pain today. Continue home medications of metoprolol 100 mg daily, resuming olmesartan 20 mg daily Advise getting a blood pressure cuff and taking routine measurements.  OSA Deferred treatment from previous notes. Consider outpatient.  HLD HDL 38, LDL 58, triglycerides 38 Continue rosuvastatin 20 mg daily and Zetia 20 mg daily.   DM Check A1c, last 6.1%.   For questions or updates, please contact [place] Please consult [email or web address] for contact info under        Signed, Bonnee Quin, PA-C  05/24/2022, 8:09 AM     I have examined the patient and reviewed assessment and plan and discussed with patient.  Agree with above as stated.    Had some chest discomfort last night.  It was associated with having some emotional stress.  He was somewhat upset that he had not had an episode with his heart in over 10 years and this event happened.  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  He feels that he has a lot of responsibility.  Right wrist site stable.  Seems that he is symptoms started after beginning testosterone supplementation several weeks ago.  He has stopped this.  We decided that he can walk with cardiac rehab.  I did ask him to stop eating breakfast in the event that we needed to do another heart cath.  My sense is his symptoms are more anxiety related.  Troponin has been trending down.  As long as he does well with cardiac rehab, I think he can go home later today.  We also talked about  checking blood pressures at home.  Blood pressure elevated this morning.  Home readings  would also be helpful.  I spoke to his son at length about increasing exercise and healthy diet.  Larae Grooms

## 2022-05-24 NOTE — Progress Notes (Signed)
CARDIAC REHAB PHASE I   PRE:  Rate/Rhythm: 112 ST  BP:  Sitting: 152/104      SaO2: 98 RA  MODE:  Ambulation: 150 ft   POST:  Rate/Rhythm: 114 ST   BP:  Sitting: 142/106      SaO2: 98 RA Ambulated in hall independently, tolerated well with no dizziness, SOB or CP. Returned to bed with call bell and bedside table in reach. Post MI/PTCA education including site care, restrictions, risk factors, antiplatelet therapy importance, exercise guidelines, heart healthy diet, MI booklet and CRP2 reviewed. All questions and concerns addressed. Will refer to [facility] for CRP2. Will continue to follow.   [id number]  Vanessa Barbara, RN BSN 05/24/2022 9:21 AM

## 2022-05-24 NOTE — Progress Notes (Signed)
Complained of CP after ambulating to the bathroom. Described as aching, intensified from the previous pain he had, not radiating, No SOB. EKG done. Notified J. Howerter, DO. Nitro SL given as ordered. Pain resolved after 1 dose of Nitro SL. Plan of care ongoing.    05/24/22 0550  Vitals  Temp 98.1 F (36.7 C)  Temp Source Oral  BP (!) 184/126  MAP (mmHg) 144  BP Location Left Arm  BP Method Automatic  Patient Position (if appropriate) Lying  Pulse Rate 93  Pulse Rate Source Monitor  ECG Heart Rate 91  Resp 20  MEWS COLOR  MEWS Score Color Green  Oxygen Therapy  SpO2 97 %  O2 Device Room Air  MEWS Score  MEWS Temp 0  MEWS Systolic 0  MEWS Pulse 0  MEWS RR 0  MEWS LOC 0  MEWS Score 0

## 2022-05-24 NOTE — Progress Notes (Signed)
Mobility Specialist Progress Note:   05/24/22 1420  Mobility  Activity Ambulated independently in hallway  Level of Assistance Modified independent, requires aide device or extra time  Assistive Device Other (Comment) (IV Pole)  Distance Ambulated (ft) 500 ft  Activity Response Tolerated well  Mobility Referral Yes  $Mobility charge 1 Mobility   During Mobility: HR 90bpm Post Mobility: HR 87bpm  Pt eager for mobility session. Required no physical assistance. Pt denies SOB/CP throughout ambulation. Pt back in bed with all needs met.   Nelta Numbers Mobility Specialist Please contact via SecureChat or  Rehab office at [phone]

## 2022-05-24 NOTE — Progress Notes (Signed)
Complained of CP, 5/10, sharp, not radiating. No SOB. Nitro SL given PRN.   AT 0730h, pain =0/10.   Informed the day shift nurse.

## 2022-05-24 NOTE — Plan of Care (Signed)

## 2022-05-24 NOTE — Discharge Summary (Addendum)
Physician Discharge Summary   Patient: [name] MRN: [id number] DOB: [birth year]  Admit date:     05/23/2022  Discharge date: 05/24/22  Discharge Physician: Raiford Noble, DO   PCP: Andria Frames, PA-C   Recommendations at discharge:   Follow up with PCP within 1-2 weeks and repeat CBC,CMP,Mag,Phos within 1 week Follow up with Cardiology in the outpatient setting and has an appointment set up for 06/07/22 Continue Dual Antiplatelet Therapy for 12 months   Discharge Diagnoses: Principal Problem:   NSTEMI (non-ST elevated myocardial infarction) [facility]) Active Problems:   Essential hypertension   Bladder disorder   Coronary artery disease   Hyperlipidemia   Chronic myeloid leukemia ([place])   History of prostate cancer  Resolved Problems:   * No resolved hospital problems. *  Hospital Course: [name] is a 67 y.o. male with medical history significant of for but not limited to arthritis, history of bladder disorder and history of prostate cancer and CML in remission, history of CAD status post stenting to the right circumflex and RCA, history of diverticulosis, hyperlipidemia, hypertension history of prior MI as well as other comorbidities who presents to the emergency room for evaluation for chest pain.  Patient states that he has had chest discomfort and pain for last 4 months but is progressively gotten worse.  Initially thought to be attributed to GERD.  Denies any nausea, vomiting, lightheadedness or dizziness or any diaphoresis but states that he has had midsternal chest discomfort that radiates across his chest from shoulder to shoulder.  He was recently treated for reflux and evaluated by his GI physician who wanted to perform an endoscopy.  He has been taking his medications without any improvement and associated nausea associated worsening chest discomfort this morning.  States the pain does not radiate to his back or abdomen and pain was not improved with sublingual  nitrogen.  Given his symptoms he sought care and wanted to get evaluated and came to the ED.  Is evaluated at med center [facility] and given his elevated troponins he was transferred to [facility] for further evaluation by the cardiology team.  Cardiology has evaluated him and are planning on taking him for cardiac cath later today given concern for NSTEMI.  He has been placed on a heparin drip and he remains n.p.o.   Subsequently he was taken for a cardiac cath and was found to have severe in-stent stenosis of his proximal circumflex stent and cardiologist did a PTCA and recommended dual antiplatelet therapy.  He did well and the next day overnight he developed chest pain.  Cardiology felt is more secondary to anxiety and recommended an echocardiogram.  Echocardiogram done and showed "Left ventricular ejection fraction, by estimation, is 45 to 50%. The left ventricle has mildly decreased function. The left ventricle demonstrates regional wall motion abnormalities (see scoring  diagram/findings for description). Left ventricular  diastolic parameters are consistent with Grade I diastolic dysfunction (impaired relaxation)."  The troponin was trending down and cardiology felt it was okay for him to be discharged given that he ambulated without issues with the cardiac rehab team and that his chest pain resolved.  Is been likely stable for discharge and will need to follow-up with PCP, cardiology within 1 to 2 weeks  Assessment and Plan:  NSTEMI in a patient with a Hx of CAD status post stenting -Presents with Chest Pain  -Troponin went from 496 -> 881 and will continue to monitor and trend -DG Chest X-Ray done  and showed "No active cardiopulmonary disease." -Check ECHOCardiogram showed an EF of 40 to 45% with grade 1 diastolic dysfunction -Given aspirin 324 mg p.o. once in the ED as well as nitroglycerin topical 1 inch 15 mg for chest discomfort -Aspirin 81 mg in the a.m. as well as metoprolol succinate  100 g p.o. daily to resume in the a.m. -Getting IV Solu-Medrol as well as diphenhydramine given his contrast allergy -Initiated on heparin drip and we will continue and then he was transitioned to dual antiplatelet therapy after his cath with Brilinta and aspirin -Have notified cardiology for further evaluation and management planning a cardiac cath later on today -Cath showed significant in-stent stenosis of the circumflex and he underwent successful PTCA -Nitroglycerin 1 spray sublingual every 5 minutes as needed for chest pain changed to sublingual tabs -Continue supportive care with ondansetron 4 mg IV every 6 as needed nausea -Cardiology felt that he could be safely discharged home and follow-up in outpatient setting   HLD -C/w Rosuvastatin 20 mg p.o. daily and ezetimibe 10 mg -Lipid panel done and showed total cholesterol/HDL ratio of 2.7, cholesterol level 104, HDL 38, LDL 58, lipoprotein a of 305.4, triglycerides of 38, VLDL of 8   Essential HTN -Continue with blood pressure monitoring per protocol and resume home metoprolol succinate in the a.m. but hold olmesartan for now given that he is going for cardiac cath -Resume home medications   History of prostate cancer and history of CML in remission -Outpatient follow-up -Hold Nilotinib for now and resume at discharge  History of bladder disorder -Continue with darifenacin 15 mg p.o. daily as well as mirabegron 50 mg p.o. daily   Overweight  -Complicates overall prognosis and care -Estimated body mass index is 26.25 kg/m as calculated from the following:   Height as of this encounter: 5\' 7"  (1.702 m).   Weight as of this encounter: 76 kg.  -Weight Loss and Dietary Counseling given  GERD/GI prophylaxis -Continue with Dexilant equivalent with pantoprazole -Also continue with famotidine  Hypophosphatemia -Phos Level was 1.3 -Replete prior to D/C -Continue to monitor and trend in the outpatient setting  Consultants:  Cardiology  Procedures performed: Cardiac Cath, ECHO   CATH:   Prox RCA lesion is 50% stenosed.  Tortuous portion of the vessel which has progressed since 2013.   Mid LAD lesion is 25% stenosed.   2nd Diag lesion is 25% stenosed.   Prox LAD lesion is 50% stenosed.  RFR 0.95.  This area has progressed since 2013.   Ost Cx to Prox Cx lesion is 90% stenosed.   Scoring balloon angioplasty was performed using a BALLN WOLVERINE 2.50X10.   Post intervention, there is a 10% residual stenosis.   The left ventricular systolic function is normal.   LV end diastolic pressure is normal.   The left ventricular ejection fraction is 55-65% by visual estimate.   There is no aortic valve stenosis.   Tortuosity noted in the right subclavian.  Somewhat difficult to torque catheters without the benefit of a destination sheath which was not available today.   Successful PTCA of the severe in-stent restenosis of the proximal circumflex stent.  Continue with dual antiplatelet therapy for 12 months.  Continue aggressive secondary prevention.  Results conveyed to the patient's son,  Jodi Mourning [id number].  Plan for discharge tomorrow.  ECHOCARDIOGRAM IMPRESSIONS     1. Left ventricular ejection fraction, by estimation, is 45 to 50%. The  left ventricle has mildly decreased function. The left ventricle  demonstrates regional wall motion abnormalities (see scoring  diagram/findings for description). Left ventricular  diastolic parameters are consistent with Grade I diastolic dysfunction  (impaired relaxation).   2. Right ventricular systolic function is normal. The right ventricular  size is normal. There is normal pulmonary artery systolic pressure.   3. The mitral valve is normal in structure. No evidence of mitral valve  regurgitation. No evidence of mitral stenosis.   4. The aortic valve is normal in structure. Aortic valve regurgitation is  not visualized. No aortic stenosis is present.   5. The inferior  vena cava is normal in size with greater than 50%  respiratory variability, suggesting right atrial pressure of 3 mmHg.   FINDINGS   Left Ventricle: Left ventricular ejection fraction, by estimation, is 45  to 50%. The left ventricle has mildly decreased function. The left  ventricle demonstrates regional wall motion abnormalities. Definity  contrast agent was given IV to delineate the  left ventricular endocardial borders. The left ventricular internal cavity  size was normal in size. There is no left ventricular hypertrophy. Left  ventricular diastolic parameters are consistent with Grade I diastolic  dysfunction (impaired relaxation).     LV Wall Scoring:  The entire anterior septum, apical lateral segment, apical anterior  segment,  and apex are akinetic. The anterior wall, antero-lateral wall, entire  inferior wall, posterior wall, mid inferoseptal segment, and basal  inferoseptal segment are hypokinetic.   Right Ventricle: The right ventricular size is normal. No increase in  right ventricular wall thickness. Right ventricular systolic function is  normal. There is normal pulmonary artery systolic pressure. The tricuspid  regurgitant velocity is 1.74 m/s, and   with an assumed right atrial pressure of 3 mmHg, the estimated right  ventricular systolic pressure is 0000000 mmHg.   Left Atrium: Left atrial size was normal in size.   Right Atrium: Right atrial size was normal in size.   Pericardium: There is no evidence of pericardial effusion.   Mitral Valve: The mitral valve is normal in structure. No evidence of  mitral valve regurgitation. No evidence of mitral valve stenosis.   Tricuspid Valve: The tricuspid valve is normal in structure. Tricuspid  valve regurgitation is trivial. No evidence of tricuspid stenosis.   Aortic Valve: The aortic valve is normal in structure. Aortic valve  regurgitation is not visualized. No aortic stenosis is present.   Pulmonic Valve: The  pulmonic valve was normal in structure. Pulmonic valve  regurgitation is not visualized. No evidence of pulmonic stenosis.   Aorta: The aortic root is normal in size and structure.   Venous: The inferior vena cava is normal in size with greater than 50%  respiratory variability, suggesting right atrial pressure of 3 mmHg.   IAS/Shunts: No atrial level shunt detected by color flow Doppler.     LEFT VENTRICLE  PLAX 2D  LVIDd:         5.40 cm      Diastology  LVIDs:         4.20 cm      LV e' medial:    5.44 cm/s  LV PW:         0.90 cm      LV E/e' medial:  8.1  LV IVS:        0.90 cm      LV e' lateral:   8.49 cm/s  LVOT diam:     2.10 cm      LV E/e' lateral: 5.2  LV  SV:         62  LV SV Index:   33  LVOT Area:     3.46 cm    LV Volumes (MOD)  LV vol d, MOD A2C: 143.0 ml  LV vol d, MOD A4C: 138.0 ml  LV vol s, MOD A2C: 83.6 ml  LV vol s, MOD A4C: 77.1 ml  LV SV MOD A2C:     59.4 ml  LV SV MOD A4C:     138.0 ml  LV SV MOD BP:      61.0 ml   RIGHT VENTRICLE             IVC  RV Basal diam:  3.00 cm     IVC diam: 1.50 cm  RV S prime:     14.40 cm/s  TAPSE (M-mode): 1.9 cm   LEFT ATRIUM             Index        RIGHT ATRIUM           Index  LA diam:        3.70 cm 1.97 cm/m   RA Area:     11.70 cm  LA Vol (A2C):   31.6 ml 16.85 ml/m  RA Volume:   24.40 ml  13.01 ml/m  LA Vol (A4C):   39.3 ml 20.95 ml/m  LA Biplane Vol: 36.9 ml 19.67 ml/m   AORTIC VALVE  LVOT Vmax:   77.70 cm/s  LVOT Vmean:  56.000 cm/s  LVOT VTI:    0.179 m    AORTA  Ao Root diam: 3.60 cm  Ao Asc diam:  3.60 cm   MITRAL VALVE                TRICUSPID VALVE  MV Area (PHT): 4.10 cm     TR Peak grad:   12.1 mmHg  MV Decel Time: 185 msec     TR Vmax:        174.00 cm/s  MV E velocity: 44.10 cm/s  MV A velocity: 114.00 cm/s  SHUNTS  MV E/A ratio:  0.39         Systemic VTI:  0.18 m                              Systemic Diam: 2.10 cm    Disposition: Home Diet recommendation:  Discharge Diet  Orders (From admission, onward)     Start     Ordered   05/24/22 0000  Diet - low sodium heart healthy        05/24/22 1541   05/24/22 0000  Diet Carb Modified        05/24/22 1541           Cardiac diet DISCHARGE MEDICATION: Allergies as of 05/24/2022       Reactions   Contrast Media [iodinated Contrast Media] Hives   "a few hives; not a lot"        Medication List     STOP taking these medications    nitroGLYCERIN 0.4 MG/SPRAY spray Commonly known as: NITROLINGUAL Replaced by: nitroGLYCERIN 0.4 MG SL tablet       TAKE these medications    acetaminophen 325 MG tablet Commonly known as: TYLENOL Take 2 tablets (650 mg total) by mouth every 4 (four) hours as needed for headache or mild pain.   Aspirin Low Dose 81 MG chewable tablet Generic drug: aspirin  Chew 1 tablet (81 mg total) by mouth daily. Start taking on: May 25, 2022 What changed: how much to take   Brilinta 90 MG Tabs tablet Generic drug: ticagrelor Take 1 tablet (90 mg total) by mouth 2 (two) times daily.   cholecalciferol 1000 units tablet Commonly known as: VITAMIN D Take 5,000 Units by mouth daily.   CoQ10 100 MG Caps Take 100 mg by mouth daily.   cyclobenzaprine 10 MG tablet Commonly known as: FLEXERIL Take 10 mg by mouth at bedtime as needed.   darifenacin 15 MG 24 hr tablet Commonly known as: ENABLEX Take 15 mg by mouth daily.   Dexilant 60 MG capsule Generic drug: dexlansoprazole TAKE 1 CAPSULE BY MOUTH ONCE DAILY   ezetimibe 10 MG tablet Commonly known as: ZETIA Take 10 mg by mouth daily.   famotidine 20 MG tablet Commonly known as: PEPCID Take 20 mg by mouth 2 (two) times daily.   FISH OIL PO Take by mouth.   hydrOXYzine 25 MG tablet Commonly known as: ATARAX Take 1 tablet (25 mg total) by mouth 2 (two) times daily.   isosorbide mononitrate 60 MG 24 hr tablet Commonly known as: IMDUR TAKE 1 TABLET BY MOUTH ONCE DAILY   metoprolol succinate 100 MG 24 hr  tablet Commonly known as: TOPROL-XL TAKE 1 TABLET BY MOUTH WITH MEALS OR IMMEDIATELY FOLLOWING   Myrbetriq 50 MG Tb24 tablet Generic drug: mirabegron ER Take 50 mg by mouth daily.   nilotinib 200 MG capsule Commonly known as: TASIGNA Take 400 mg by mouth daily.   nitroGLYCERIN 0.4 MG SL tablet Commonly known as: NITROSTAT Place 1 tablet (0.4 mg total) under the tongue every 5 (five) minutes as needed for chest pain. Replaces: nitroGLYCERIN 0.4 MG/SPRAY spray   olmesartan 20 MG tablet Commonly known as: BENICAR Take 1 tablet (20 mg total) by mouth daily.   Ozempic (0.25 or 0.5 MG/DOSE) 2 MG/3ML Sopn Generic drug: Semaglutide(0.25 or 0.5MG /DOS) Inject 0.5 mg into the skin once a week.   rosuvastatin 20 MG tablet Commonly known as: CRESTOR TAKE 1 TABLET BY MOUTH ONCE DAILY   sucralfate 1 GM/10ML suspension Commonly known as: CARAFATE Take 10 mLs by mouth 4 (four) times daily.   VITAMIN B 12 PO Take by mouth.   vitamin C 1000 MG tablet Take 1,000 mg by mouth daily.   VITAMIN E PO Take 1 tablet by mouth daily.        Discharge Exam: Filed Weights   05/23/22 1457  Weight: 76 kg   Vitals:   05/24/22 1234 05/24/22 1604  BP: (!) 145/102 (!) 147/105  Pulse: 98 91  Resp: 16 16  Temp: 97.8 F (36.6 C)   SpO2: 96% 95%   Examination: Physical Exam:  Constitutional: WN/WD overweight African-American male who is anxious morning but no acute distress now and denies any chest pain. Respiratory: Diminished to auscultation bilaterally with some coarse breath sounds, no wheezing, rales, rhonchi or crackles. Normal respiratory effort and patient is not tachypenic. No accessory muscle use.  Unlabored breathing Cardiovascular: RRR, no murmurs / rubs / gallops. S1 and S2 auscultated.  Trace extremity edema Abdomen: Soft, non-tender, distended due to body habitus. Bowel sounds positive.  GU: Deferred. Musculoskeletal: No clubbing / cyanosis of digits/nails. No joint  deformity upper and lower extremities.  Skin: No rashes, lesions, ulcers on limited skin evaluation. No induration; Warm and dry.  Neurologic: CN 2-12 grossly intact with no focal deficits. Romberg sign and cerebellar reflexes not assessed.  Psychiatric: Normal judgment and insight. Alert and oriented x 3. Normal mood and appropriate affect but was anxious earlier this a.m.  Condition at discharge: stable  The results of significant diagnostics from this hospitalization (including imaging, microbiology, ancillary and laboratory) are listed below for reference.   Imaging Studies: ECHOCARDIOGRAM COMPLETE  Result Date: 05/24/2022    ECHOCARDIOGRAM REPORT   Patient Name:   MINER KORAL Date of Exam: 05/24/2022 Medical Rec #:  [id number]         Height:       67.0 in Accession #:    [id number]        Weight:       167.6 lb Date of Birth:  1955/08/24          BSA:          1.876 m Patient Age:    45 years          BP:           146/105 mmHg Patient Gender: M                 HR:           98 bpm. Exam Location:  Inpatient Procedure: 2D Echo, Color Doppler, Cardiac Doppler and Intracardiac            Opacification Agent Indications:    R07.9* Chest pain, unspecified  History:        Patient has prior history of Echocardiogram examinations. CAD                 and Previous Myocardial Infarction; Risk Factors:Hypertension                 and Dyslipidemia.  Sonographer:    Phineas Douglas Referring Phys: [id number] Charlann Lange VARANASI IMPRESSIONS  1. Left ventricular ejection fraction, by estimation, is 45 to 50%. The left ventricle has mildly decreased function. The left ventricle demonstrates regional wall motion abnormalities (see scoring diagram/findings for description). Left ventricular diastolic parameters are consistent with Grade I diastolic dysfunction (impaired relaxation).  2. Right ventricular systolic function is normal. The right ventricular size is normal. There is normal pulmonary artery systolic  pressure.  3. The mitral valve is normal in structure. No evidence of mitral valve regurgitation. No evidence of mitral stenosis.  4. The aortic valve is normal in structure. Aortic valve regurgitation is not visualized. No aortic stenosis is present.  5. The inferior vena cava is normal in size with greater than 50% respiratory variability, suggesting right atrial pressure of 3 mmHg. FINDINGS  Left Ventricle: Left ventricular ejection fraction, by estimation, is 45 to 50%. The left ventricle has mildly decreased function. The left ventricle demonstrates regional wall motion abnormalities. Definity contrast agent was given IV to delineate the left ventricular endocardial borders. The left ventricular internal cavity size was normal in size. There is no left ventricular hypertrophy. Left ventricular diastolic parameters are consistent with Grade I diastolic dysfunction (impaired relaxation).  LV Wall Scoring: The entire anterior septum, apical lateral segment, apical anterior segment, and apex are akinetic. The anterior wall, antero-lateral wall, entire inferior wall, posterior wall, mid inferoseptal segment, and basal inferoseptal segment are hypokinetic. Right Ventricle: The right ventricular size is normal. No increase in right ventricular wall thickness. Right ventricular systolic function is normal. There is normal pulmonary artery systolic pressure. The tricuspid regurgitant velocity is 1.74 m/s, and  with an assumed right atrial pressure of 3 mmHg, the estimated right ventricular systolic pressure  is 15.1 mmHg. Left Atrium: Left atrial size was normal in size. Right Atrium: Right atrial size was normal in size. Pericardium: There is no evidence of pericardial effusion. Mitral Valve: The mitral valve is normal in structure. No evidence of mitral valve regurgitation. No evidence of mitral valve stenosis. Tricuspid Valve: The tricuspid valve is normal in structure. Tricuspid valve regurgitation is trivial. No  evidence of tricuspid stenosis. Aortic Valve: The aortic valve is normal in structure. Aortic valve regurgitation is not visualized. No aortic stenosis is present. Pulmonic Valve: The pulmonic valve was normal in structure. Pulmonic valve regurgitation is not visualized. No evidence of pulmonic stenosis. Aorta: The aortic root is normal in size and structure. Venous: The inferior vena cava is normal in size with greater than 50% respiratory variability, suggesting right atrial pressure of 3 mmHg. IAS/Shunts: No atrial level shunt detected by color flow Doppler.  LEFT VENTRICLE PLAX 2D LVIDd:         5.40 cm      Diastology LVIDs:         4.20 cm      LV e' medial:    5.44 cm/s LV PW:         0.90 cm      LV E/e' medial:  8.1 LV IVS:        0.90 cm      LV e' lateral:   8.49 cm/s LVOT diam:     2.10 cm      LV E/e' lateral: 5.2 LV SV:         62 LV SV Index:   33 LVOT Area:     3.46 cm  LV Volumes (MOD) LV vol d, MOD A2C: 143.0 ml LV vol d, MOD A4C: 138.0 ml LV vol s, MOD A2C: 83.6 ml LV vol s, MOD A4C: 77.1 ml LV SV MOD A2C:     59.4 ml LV SV MOD A4C:     138.0 ml LV SV MOD BP:      61.0 ml RIGHT VENTRICLE             IVC RV Basal diam:  3.00 cm     IVC diam: 1.50 cm RV S prime:     14.40 cm/s TAPSE (M-mode): 1.9 cm LEFT ATRIUM             Index        RIGHT ATRIUM           Index LA diam:        3.70 cm 1.97 cm/m   RA Area:     11.70 cm LA Vol (A2C):   31.6 ml 16.85 ml/m  RA Volume:   24.40 ml  13.01 ml/m LA Vol (A4C):   39.3 ml 20.95 ml/m LA Biplane Vol: 36.9 ml 19.67 ml/m  AORTIC VALVE LVOT Vmax:   77.70 cm/s LVOT Vmean:  56.000 cm/s LVOT VTI:    0.179 m  AORTA Ao Root diam: 3.60 cm Ao Asc diam:  3.60 cm MITRAL VALVE                TRICUSPID VALVE MV Area (PHT): 4.10 cm     TR Peak grad:   12.1 mmHg MV Decel Time: 185 msec     TR Vmax:        174.00 cm/s MV E velocity: 44.10 cm/s MV A velocity: 114.00 cm/s  SHUNTS MV E/A ratio:  0.39         Systemic VTI:  0.18 m                             Systemic Diam:  2.10 cm Godfrey Pick Tobb DO Electronically signed by Berniece Salines DO Signature Date/Time: 05/24/2022/11:06:49 AM    Final    DG Chest Port 1 View  Result Date: 05/24/2022 CLINICAL DATA:  Chest pain EXAM: PORTABLE CHEST 1 VIEW COMPARISON:  Chest x-ray dated May 23, 2022 FINDINGS: The heart size and mediastinal contours are within normal limits. Both lungs are clear. The visualized skeletal structures are unremarkable. IMPRESSION: No active disease. Electronically Signed   By: Yetta Glassman M.D.   On: 05/24/2022 08:33   CARDIAC CATHETERIZATION  Result Date: 05/23/2022   Prox RCA lesion is 50% stenosed.  Tortuous portion of the vessel which has progressed since 2013.   Mid LAD lesion is 25% stenosed.   2nd Diag lesion is 25% stenosed.   Prox LAD lesion is 50% stenosed.  RFR 0.95.  This area has progressed since 2013.   Ost Cx to Prox Cx lesion is 90% stenosed.   Scoring balloon angioplasty was performed using a BALLN WOLVERINE 2.50X10.   Post intervention, there is a 10% residual stenosis.   The left ventricular systolic function is normal.   LV end diastolic pressure is normal.   The left ventricular ejection fraction is 55-65% by visual estimate.   There is no aortic valve stenosis.   Tortuosity noted in the right subclavian.  Somewhat difficult to torque catheters without the benefit of a destination sheath which was not available today. Successful PTCA of the severe in-stent restenosis of the proximal circumflex stent.  Continue with dual antiplatelet therapy for 12 months.  Continue aggressive secondary prevention.  Results conveyed to the patient's son,  Jodi Mourning [id number].  Plan for discharge tomorrow.   DG Chest 2 View  Result Date: 05/23/2022 CLINICAL DATA:  Midsternal chest pain with nausea for 4 months. EXAM: CHEST - 2 VIEW COMPARISON:  Chest radiographs 02/01/2016 FINDINGS: Cardiac silhouette and mediastinal contours are within normal limits. The lungs are clear. No pleural effusion or  pneumothorax. Minimal multilevel degenerative disc changes of the thoracic spine. IMPRESSION: No active cardiopulmonary disease. Electronically Signed   By: Yvonne Kendall M.D.   On: 05/23/2022 11:59    Microbiology: Results for orders placed or performed in visit on 10/30/18  Novel Coronavirus, NAA ([facility])     Status: None   Collection Time: 10/30/18 12:00 AM   Specimen: Oropharyngeal(OP) collection in vial transport medium   OROPHARYNGEA  SCREENIN  Result Value Ref Range Status   SARS-CoV-2, NAA Not Detected Not Detected Final    Comment: Testing was performed using the cobas(R) SARS-CoV-2 test. This test was developed and its performance characteristics determined by [employer]. This test has not been FDA cleared or approved. This test has been authorized by FDA under an Emergency Use Authorization (EUA). This test is only authorized for the duration of time the declaration that circumstances exist justifying the authorization of the emergency use of in vitro diagnostic tests for detection of SARS-CoV-2 virus and/or diagnosis of COVID-19 infection under section 564(b)(1) of the Act, 21 U.S.C. EL:9886759), unless the authorization is terminated or revoked sooner. When diagnostic testing is negative, the possibility of a false negative result should be considered in the context of a patient's recent exposures and the presence of clinical signs and symptoms consistent with COVID-19. An individual without symptoms of COVID-19 and  who is not shedding SARS-CoV-2 virus would expect to have a negati ve (not detected) result in this assay.    Labs: CBC: Recent Labs  Lab 05/23/22 1048 05/24/22 0147  WBC 9.7 7.6  NEUTROABS  --  6.4  HGB 15.8 15.9  HCT 44.7 44.0  MCV 90.3 88.9  PLT 230 [id number]   Basic Metabolic Panel: Recent Labs  Lab 05/23/22 1048 05/24/22 0147  NA 140 136  K 3.9 3.9  CL 106 106  CO2 29 23  GLUCOSE 96 124*  BUN 13 13  CREATININE 1.12 1.04   CALCIUM 9.6 9.0  MG  --  1.9  PHOS  --  1.3*   Liver Function Tests: Recent Labs  Lab 05/24/22 0147  AST 26  ALT 21  ALKPHOS 57  BILITOT 0.8  PROT 6.8  ALBUMIN 3.8   CBG: No results for input(s): "GLUCAP" in the last 168 hours.  Discharge time spent: greater than 30 minutes.  Signed: Raiford Noble, DO [facility] 05/24/2022

## 2022-05-24 NOTE — Progress Notes (Addendum)
[facility] night cross cover note:   I was notified by RN of the patient's low phosphorus level this morning: Phosphorus noted to be 1.3.  Potassium level 3.9.  I subsequently ordered Neutra-phos 500 mg PO x 1 dose now.    Update: patient with an additional episode of nonradiating nonradiating nonradiating non-radiating chest pain this morning, without any associated symptoms. 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  Chest pain resolved following a single episode of sublingual nitroglycerin, without any residual dyspnea reported at this time.  EKG obtained, demonstrated sinus rhythm with PAC, heart rate 93, normal intervals, T wave flattening in V5/V6, which appears similar to most recent prior EKG, along with less than 1 mm ST elevation limited to V2.  Otherwise, no interval ST changes noted.  I have also ordered a repeat troponin level, his most recent prior troponin level was still trending up.  And have also ordered updated chest x-ray.    Babs Bertin, DO Hospitalist

## 2022-05-24 NOTE — Care Management (Signed)
  Transition of Care [facility]) Screening Note   Patient Details  Name: KYIAN TEIGEN Date of Birth: 1955-04-29   [facility] of Care [facility]) CM/SW Contact:    Bethena Roys, RN Phone Number: 05/24/2022, 9:52 AM    Transition of Care Department [facility]) has reviewed the patient and no TOC needs have been identified at this time. We will continue to monitor patient advancement through interdisciplinary progression rounds. If new patient transition needs arise, please place a TOC consult.

## 2022-05-24 NOTE — Progress Notes (Signed)
Chest pain, 5/10, unable to describe per patient, not radiating. Triggered by emotion/stress. No SOB. No diaphoresis. Nitro SL given per order. Hooked to oxygen at 2lpm via [place]. Pain score = 0/10 after 1 dose of nitro SL. Plan of care ongoing.

## 2022-05-24 NOTE — Plan of Care (Signed)
  Problem: Education: Goal: Knowledge of General Education information will improve Description: Including pain rating scale, medication(s)/side effects and non-pharmacologic comfort measures Outcome: Progressing   Problem: Health Behavior/Discharge Planning: Goal: Ability to manage health-related needs will improve Outcome: Progressing   Problem: Clinical Measurements: Goal: Cardiovascular complication will be avoided Outcome: Progressing   Problem: Pain Managment: Goal: General experience of comfort will improve Outcome: Progressing   Problem: Safety: Goal: Ability to remain free from injury will improve Outcome: Progressing   Problem: Skin Integrity: Goal: Risk for impaired skin integrity will decrease Outcome: Progressing   

## 2022-05-24 NOTE — Progress Notes (Signed)
Echocardiogram 2D Echocardiogram has been performed.  Frances Furbish 05/24/2022, 10:32 AM

## 2022-05-24 NOTE — TOC Benefit Eligibility Note (Signed)
Patient Advocate Encounter  Insurance verification completed.    The patient is currently admitted and upon discharge could be taking Brilinta 90 mg.  The current 30 day co-pay is $47.00.   The patient is insured through [employer] Part D   Isabellah Sobocinski, CPHT Pharmacy Patient Advocate Specialist [facility] Patient Advocate Team Direct Number: [phone]  Fax: [phone]       

## 2022-05-25 ENCOUNTER — Other Ambulatory Visit: Payer: Self-pay

## 2022-05-25 ENCOUNTER — Emergency Department (HOSPITAL_COMMUNITY)
Admission: EM | Admit: 2022-05-25 | Discharge: 2022-05-25 | Disposition: A | Discharge status: Home / Self Care | Payer: PPO | Attending: Emergency Medicine | Admitting: Emergency Medicine

## 2022-05-25 ENCOUNTER — Encounter (HOSPITAL_COMMUNITY): Payer: Self-pay

## 2022-05-25 ENCOUNTER — Emergency Department (HOSPITAL_COMMUNITY): Payer: PPO

## 2022-05-25 DIAGNOSIS — Z8546 Personal history of malignant neoplasm of prostate: Secondary | ICD-10-CM | POA: Insufficient documentation

## 2022-05-25 DIAGNOSIS — E782 Mixed hyperlipidemia: Secondary | ICD-10-CM

## 2022-05-25 DIAGNOSIS — D72829 Elevated white blood cell count, unspecified: Secondary | ICD-10-CM | POA: Insufficient documentation

## 2022-05-25 DIAGNOSIS — Z79899 Other long term (current) drug therapy: Secondary | ICD-10-CM | POA: Insufficient documentation

## 2022-05-25 DIAGNOSIS — I2510 Atherosclerotic heart disease of native coronary artery without angina pectoris: Secondary | ICD-10-CM | POA: Diagnosis not present

## 2022-05-25 DIAGNOSIS — I10 Essential (primary) hypertension: Secondary | ICD-10-CM | POA: Insufficient documentation

## 2022-05-25 DIAGNOSIS — I25118 Atherosclerotic heart disease of native coronary artery with other forms of angina pectoris: Secondary | ICD-10-CM | POA: Diagnosis not present

## 2022-05-25 DIAGNOSIS — R079 Chest pain, unspecified: Secondary | ICD-10-CM | POA: Diagnosis not present

## 2022-05-25 DIAGNOSIS — Z7982 Long term (current) use of aspirin: Secondary | ICD-10-CM | POA: Diagnosis not present

## 2022-05-25 LAB — CBC
HCT: 45.1 % (ref 39.0–52.0)
Hemoglobin: 16.2 g/dL (ref 13.0–17.0)
MCH: 32.3 pg (ref 26.0–34.0)
MCHC: 35.9 g/dL (ref 30.0–36.0)
MCV: 90 fL (ref 80.0–100.0)
Platelets: 243 10*3/uL (ref 150–400)
RBC: 5.01 MIL/uL (ref 4.22–5.81)
RDW: 13.2 % (ref 11.5–15.5)
WBC: 11.1 10*3/uL — ABNORMAL HIGH (ref 4.0–10.5)
nRBC: 0 % (ref 0.0–0.2)

## 2022-05-25 LAB — TROPONIN I (HIGH SENSITIVITY)
Troponin I (High Sensitivity): 393 ng/L (ref <18)
Troponin I (High Sensitivity): 395 ng/L (ref <18)

## 2022-05-25 LAB — BASIC METABOLIC PANEL
Anion gap: 9 (ref 5–15)
BUN: 16 mg/dL (ref 8–23)
CO2: 23 mmol/L (ref 22–32)
Calcium: 9 mg/dL (ref 8.9–10.3)
Chloride: 105 mmol/L (ref 98–111)
Creatinine, Ser: 1.15 mg/dL (ref 0.61–1.24)
GFR, Estimated: >60 mL/min (ref >60)
Glucose, Bld: 108 mg/dL — ABNORMAL HIGH (ref 70–99)
Potassium: 3.5 mmol/L (ref 3.5–5.1)
Sodium: 137 mmol/L (ref 135–145)

## 2022-05-25 LAB — LIPOPROTEIN A (LPA): Lipoprotein (a): 305.4 nmol/L — ABNORMAL HIGH (ref <75.0)

## 2022-05-25 MED ORDER — OLMESARTAN MEDOXOMIL 40 MG PO TABS: 40.0000 mg | ORAL_TABLET | Freq: Every day | ORAL | 2 refills | Status: DC

## 2022-05-25 NOTE — Consult Note (Addendum)
Cardiology Consultation   Patient ID: [name] MRN: [id number]; DOB: 09/19/1955  Admit date: 05/25/2022 Date of Consult: 05/25/2022  PCP:  [name], [place] Prov[facility] Cardiologist:  Sinclair Grooms, MD (Inactive)   {   Patient Profile:   [name] is a 67 y.o. male with a hx of CAD s/p PCI to ramus, Lcx, RCA (2002,2004,2006) and recent balloon angioplasty to restent stenosis of proximal circumflex stent (2024), chronic myleoid leukemia, HLD, HTN, T2DM, remote prostate cancer who is being seen 05/25/2022 for the evaluation of chest pain at the request of Dr. Truett Mainland. Recently discharged yesterday 05/24/2022  History of Present Illness:   [name] has cardiac history for CAD with BMS stenting to ramus followed by overlapping stent with DES to Ramus in 2004, DES to RCA 2006 and new balloon angioplasty to restent stenosis of the proximal left circumflex in recent admission on 05/24/2022.  Echocardiogram from prior admission shows an EF of 45 to 50% with some regional wall motion abnormalities and grade 1 diastolic dysfunction. He was discharged with plan for dual antiplatelet therapy with Brilinta and aspirin for 12 months and aggressive secondary risk management for coronary artery disease.  Yesterday on the day of discharge patient was complaining of chest pain in the morning time however it was thought to be more due to stress as he became tearful and acknowledged other financial and emotional stresses in life.  He was seen and evaluated by cardiac rehab and deemed stable for discharge.  Today he is now returning with same complaints of chest pain as before.  He states that early this morning he started having the same 3/10 chest pain while sitting in bed.  Patient took 1 nitro with relief however chest pain returned about an hour after and patient patient took another nitro.  He then called EMS and he states that they advised him to take 4  aspirin and another nitro.  In total he has had 5 aspirin and 3 nitro.  He took 1 prescribed aspirin in the morning and then followed it with 4 aspirin recommended by EMS.  Patient states he has found symptomatic relief and not currently complaining of chest pain.  Patient denies any accompanying shortness of breath, radiating chest pain, numbness or tingling, nausea.  ED workup: EKG showing normal sinus rhythm with no changes from prior studies.  Chest x-ray negative, troponins 395 and trending down from previous admission.  Vital signs stable. Past Medical History:  Diagnosis Date   Arthritis    Bladder disorder    Cancer ([place])    hx prostate cancer and CML   Chronic myelocytic leukemia ([place])    Coronary artery disease    with prior MI 2002(BMS) and Cfx(2006) and RCA (2004) stents . EF 45-50%   Coronary atherosclerosis of native coronary artery    stable and suspect a component of CAS   Diverticulosis    seen on colonoscopy in 2008   Goals of care, counseling/discussion 09/13/2016   H/O colonoscopy 2014   Hyperlipidemia    Hypertension    Myocardial infarction [facility]) 02,6/12   Prostate cancer [facility])     Past Surgical History:  Procedure Laterality Date   CARDIAC CATHETERIZATION     normal EF, patent stents without obstructive disease (on Plavix X 1 month only)   CAROTID STENT     CORONARY BALLOON ANGIOPLASTY N/A 05/23/2022   Procedure: CORONARY BALLOON ANGIOPLASTY;  Surgeon: Larae Grooms  S, MD;  Location: [place] [facility];  Service: Cardiovascular;  Laterality: N/A;   FRACTURE SURGERY     rt wrist   INTRAVASCULAR PRESSURE WIRE/FFR STUDY N/A 05/23/2022   Procedure: INTRAVASCULAR PRESSURE WIRE/FFR STUDY;  Surgeon: Jettie Booze, MD;  Location: [place] [facility];  Service: Cardiovascular;  Laterality: N/A;   LEFT HEART CATH AND CORONARY ANGIOGRAPHY N/A 05/23/2022   Procedure: LEFT HEART CATH AND CORONARY ANGIOGRAPHY;  Surgeon: Jettie Booze, MD;  Location: [place] [facility];  Service: Cardiovascular;  Laterality: N/A;   LEFT HEART CATHETERIZATION WITH CORONARY ANGIOGRAM N/A 02/04/2012   Procedure: LEFT HEART CATHETERIZATION WITH CORONARY ANGIOGRAM;  Surgeon: Sinclair Grooms, MD;  Location: [facility] CATH LAB;  Service: Cardiovascular;  Laterality: N/A;   ORIF ANKLE FRACTURE  07/04/2011   Procedure: OPEN REDUCTION INTERNAL FIXATION (ORIF) ANKLE FRACTURE;  Surgeon: Alta Corning, MD;  Location: [place];  Service: Orthopedics;  Laterality: Right;   TOE SURGERY     rt great toe   TONSILLECTOMY     WRIST SURGERY     rt and lt cysts removed    Inpatient Medications: Scheduled Meds:  Continuous Infusions:  PRN Meds:   Allergies:    Allergies  Allergen Reactions   Contrast Media [Iodinated Contrast Media] Hives    "a few hives; not a lot"    Social History:   Social History   Socioeconomic History   Marital status: Divorced    Spouse name: Not on file   Number of children: Not on file   Years of education: Not on file   Highest education level: Not on file  Occupational History   Not on file  Tobacco Use   Smoking status: Former    Types: Cigarettes    Quit date: 06/06/2000    Years since quitting: 21.9   Smokeless tobacco: Never   Tobacco comments:    2002  Vaping Use   Vaping Use: Never used  Substance and Sexual Activity   Alcohol use: No    Comment: rare   Drug use: No   Sexual activity: Not on file  Other Topics Concern   Not on file  Social History Narrative   Not on file   Social Determinants of Health   Financial Resource Strain: Not on file  Food Insecurity: Not on file  Transportation Needs: Not on file  Physical Activity: Not on file  Stress: Not on file  Social Connections: Not on file  Intimate Partner Violence: Not on file    Family History:   Family History  Problem Relation Age of Onset   Cancer Mother        Liver     ROS:  Please see the history of present illness.  All other  ROS reviewed and negative.     Physical Exam/Data:   Vitals:   05/25/22 0846 05/25/22 1000 05/25/22 1030 05/25/22 1236  BP: (!) 129/99 (!) 127/98 (!) 127/94 (!) 134/96  Pulse: 88 85 84 92  Resp: 18 18 16 20   Temp: 98.3 F (36.8 C)     TempSrc: Oral     SpO2: 100% 99% 99% 100%   No intake or output data in the 24 hours ending 05/25/22 1310    05/23/2022    2:57 PM 01/29/2018    8:54 AM 12/19/2017   10:24 AM  Last 3 Weights  Weight (lbs) 167 lb 9.6 oz 187 lb 188 lb  Weight (kg) 76.023  kg 84.823 kg 85.276 kg     There is no height or weight on file to calculate BMI.  General:  Well nourished, well developed, in no acute distress HEENT: normal Neck: no JVD Vascular: No carotid bruits; Distal pulses 2+ bilaterally Cardiac:  normal S1, S2; RRR; no murmur  Lungs:  clear to auscultation bilaterally, no wheezing, rhonchi or rales  Abd: soft, nontender, no hepatomegaly  Ext: no edema Musculoskeletal:  No deformities, BUE and BLE strength normal and equal Skin: warm and dry  Neuro:  CNs 2-12 intact, no focal abnormalities noted Psych:  Normal affect   EKG:  The EKG was personally reviewed and demonstrates: Normal sinus rhythm, unchanged from prior reading Telemetry:  Telemetry was personally reviewed and demonstrates: Normal sinus rhythm with a heart rate of 85  Relevant CV Studies:  Echocardiogram 05/24/2022 Left Ventricle: Left ventricular ejection fraction, by estimation, is 45  to 50%. The left ventricle has mildly decreased function. The left  ventricle demonstrates regional wall motion abnormalities. Definity  contrast agent was given IV to delineate the  left ventricular endocardial borders. The left ventricular internal cavity  size was normal in size. There is no left ventricular hypertrophy. Left  ventricular diastolic parameters are consistent with Grade I diastolic  dysfunction (impaired relaxation).     LV Wall Scoring:  The entire anterior septum, apical  lateral segment, apical anterior  segment,  and apex are akinetic. The anterior wall, antero-lateral wall, entire  inferior wall, posterior wall, mid inferoseptal segment, and basal  inferoseptal segment are hypokinetic.   Right Ventricle: The right ventricular size is normal. No increase in  right ventricular wall thickness. Right ventricular systolic function is  normal. There is normal pulmonary artery systolic pressure. The tricuspid  regurgitant velocity is 1.74 m/s, and   with an assumed right atrial pressure of 3 mmHg, the estimated right  ventricular systolic pressure is 0000000 mmHg.   Left Atrium: Left atrial size was normal in size.   Right Atrium: Right atrial size was normal in size.   Pericardium: There is no evidence of pericardial effusion.   Mitral Valve: The mitral valve is normal in structure. No evidence of  mitral valve regurgitation. No evidence of mitral valve stenosis.   Tricuspid Valve: The tricuspid valve is normal in structure. Tricuspid  valve regurgitation is trivial. No evidence of tricuspid stenosis.   Aortic Valve: The aortic valve is normal in structure. Aortic valve  regurgitation is not visualized. No aortic stenosis is present.   Pulmonic Valve: The pulmonic valve was normal in structure. Pulmonic valve  regurgitation is not visualized. No evidence of pulmonic stenosis.   Aorta: The aortic root is normal in size and structure.   Venous: The inferior vena cava is normal in size with greater than 50%  respiratory variability, suggesting right atrial pressure of 3 mmHg.   IAS/Shunts: No atrial level shunt detected by color flow Doppler.    Left heart catheterization 05/23/2022   Prox RCA lesion is 50% stenosed.  Tortuous portion of the vessel which has progressed since 2013.   Mid LAD lesion is 25% stenosed.   2nd Diag lesion is 25% stenosed.   Prox LAD lesion is 50% stenosed.  RFR 0.95.  This area has progressed since 2013.   Ost Cx to Prox Cx  lesion is 90% stenosed.   Scoring balloon angioplasty was performed using a BALLN WOLVERINE 2.50X10.   Post intervention, there is a 10% residual stenosis.   The left  ventricular systolic function is normal.   LV end diastolic pressure is normal.   The left ventricular ejection fraction is 55-65% by visual estimate.   There is no aortic valve stenosis.   Tortuosity noted in the right subclavian.  Somewhat difficult to torque catheters without the benefit of a destination sheath which was not available today.  Laboratory Data:  High Sensitivity Troponin:   Recent Labs  Lab 05/23/22 1613 05/23/22 1817 05/24/22 0147 05/25/22 0850 05/25/22 1044  TROPONINIHS 1,275* 1,586* 1,173* 393* 395*     Chemistry Recent Labs  Lab 05/23/22 1048 05/24/22 0147 05/25/22 0850  NA 140 136 137  K 3.9 3.9 3.5  CL 106 106 105  CO2 29 23 23   GLUCOSE 96 124* 108*  BUN 13 13 16   CREATININE 1.12 1.04 1.15  CALCIUM 9.6 9.0 9.0  MG  --  1.9  --   GFRNONAA >60 >60 >60  ANIONGAP 5 7 9     Recent Labs  Lab 05/24/22 0147  PROT 6.8  ALBUMIN 3.8  AST 26  ALT 21  ALKPHOS 57  BILITOT 0.8   Lipids  Recent Labs  Lab 05/24/22 0147  CHOL 104  TRIG 38  HDL 38*  LDLCALC 58  CHOLHDL 2.7    Hematology Recent Labs  Lab 05/23/22 1048 05/24/22 0147 05/25/22 0850  WBC 9.7 7.6 11.1*  RBC 4.95 4.95 5.01  HGB 15.8 15.9 16.2  HCT 44.7 44.0 45.1  MCV 90.3 88.9 90.0  MCH 31.9 32.1 32.3  MCHC 35.3 36.1* 35.9  RDW 13.3 13.0 13.2  PLT 230 238 243   Thyroid No results for input(s): "TSH", "FREET4" in the last 168 hours.  BNPNo results for input(s): "BNP", "PROBNP" in the last 168 hours.  DDimer No results for input(s): "DDIMER" in the last 168 hours.   Radiology/Studies:  DG Chest Port 1 View  Result Date: 05/25/2022 CLINICAL DATA:  67 year old male with midline chest pain since yesterday. EXAM: PORTABLE CHEST 1 VIEW COMPARISON:  Portable chest 05/24/2022 and earlier. FINDINGS: Portable AP semi  upright view at 0853 hours. Lung volumes and mediastinal contours are normal. Visualized tracheal air column is within normal limits. Allowing for portable technique the lungs are clear. No pneumothorax or pleural effusion. No acute osseous abnormality identified. Negative visible bowel gas. IMPRESSION: Negative portable chest. Electronically Signed   By: Genevie Ann M.D.   On: 05/25/2022 08:58   ECHOCARDIOGRAM COMPLETE  Result Date: 05/24/2022    ECHOCARDIOGRAM REPORT   Patient Name:   WALID LAMBERTI Date of Exam: 05/24/2022 Medical Rec #:  [id number]         Height:       67.0 in Accession #:    [id number]        Weight:       167.6 lb Date of Birth:  Dec 09, 1955          BSA:          1.876 m Patient Age:    84 years          BP:           146/105 mmHg Patient Gender: M                 HR:           98 bpm. Exam Location:  Inpatient Procedure: 2D Echo, Color Doppler, Cardiac Doppler and Intracardiac            Opacification Agent Indications:  R07.9* Chest pain, unspecified  History:        Patient has prior history of Echocardiogram examinations. CAD                 and Previous Myocardial Infarction; Risk Factors:Hypertension                 and Dyslipidemia.  Sonographer:    Phineas Douglas Referring Phys: [id number] Charlann Lange Venson Ferencz IMPRESSIONS  1. Left ventricular ejection fraction, by estimation, is 45 to 50%. The left ventricle has mildly decreased function. The left ventricle demonstrates regional wall motion abnormalities (see scoring diagram/findings for description). Left ventricular diastolic parameters are consistent with Grade I diastolic dysfunction (impaired relaxation).  2. Right ventricular systolic function is normal. The right ventricular size is normal. There is normal pulmonary artery systolic pressure.  3. The mitral valve is normal in structure. No evidence of mitral valve regurgitation. No evidence of mitral stenosis.  4. The aortic valve is normal in structure. Aortic valve regurgitation is  not visualized. No aortic stenosis is present.  5. The inferior vena cava is normal in size with greater than 50% respiratory variability, suggesting right atrial pressure of 3 mmHg. FINDINGS  Left Ventricle: Left ventricular ejection fraction, by estimation, is 45 to 50%. The left ventricle has mildly decreased function. The left ventricle demonstrates regional wall motion abnormalities. Definity contrast agent was given IV to delineate the left ventricular endocardial borders. The left ventricular internal cavity size was normal in size. There is no left ventricular hypertrophy. Left ventricular diastolic parameters are consistent with Grade I diastolic dysfunction (impaired relaxation).  LV Wall Scoring: The entire anterior septum, apical lateral segment, apical anterior segment, and apex are akinetic. The anterior wall, antero-lateral wall, entire inferior wall, posterior wall, mid inferoseptal segment, and basal inferoseptal segment are hypokinetic. Right Ventricle: The right ventricular size is normal. No increase in right ventricular wall thickness. Right ventricular systolic function is normal. There is normal pulmonary artery systolic pressure. The tricuspid regurgitant velocity is 1.74 m/s, and  with an assumed right atrial pressure of 3 mmHg, the estimated right ventricular systolic pressure is 0000000 mmHg. Left Atrium: Left atrial size was normal in size. Right Atrium: Right atrial size was normal in size. Pericardium: There is no evidence of pericardial effusion. Mitral Valve: The mitral valve is normal in structure. No evidence of mitral valve regurgitation. No evidence of mitral valve stenosis. Tricuspid Valve: The tricuspid valve is normal in structure. Tricuspid valve regurgitation is trivial. No evidence of tricuspid stenosis. Aortic Valve: The aortic valve is normal in structure. Aortic valve regurgitation is not visualized. No aortic stenosis is present. Pulmonic Valve: The pulmonic valve was  normal in structure. Pulmonic valve regurgitation is not visualized. No evidence of pulmonic stenosis. Aorta: The aortic root is normal in size and structure. Venous: The inferior vena cava is normal in size with greater than 50% respiratory variability, suggesting right atrial pressure of 3 mmHg. IAS/Shunts: No atrial level shunt detected by color flow Doppler.  LEFT VENTRICLE PLAX 2D LVIDd:         5.40 cm      Diastology LVIDs:         4.20 cm      LV e' medial:    5.44 cm/s LV PW:         0.90 cm      LV E/e' medial:  8.1 LV IVS:        0.90 cm  LV e' lateral:   8.49 cm/s LVOT diam:     2.10 cm      LV E/e' lateral: 5.2 LV SV:         62 LV SV Index:   33 LVOT Area:     3.46 cm  LV Volumes (MOD) LV vol d, MOD A2C: 143.0 ml LV vol d, MOD A4C: 138.0 ml LV vol s, MOD A2C: 83.6 ml LV vol s, MOD A4C: 77.1 ml LV SV MOD A2C:     59.4 ml LV SV MOD A4C:     138.0 ml LV SV MOD BP:      61.0 ml RIGHT VENTRICLE             IVC RV Basal diam:  3.00 cm     IVC diam: 1.50 cm RV S prime:     14.40 cm/s TAPSE (M-mode): 1.9 cm LEFT ATRIUM             Index        RIGHT ATRIUM           Index LA diam:        3.70 cm 1.97 cm/m   RA Area:     11.70 cm LA Vol (A2C):   31.6 ml 16.85 ml/m  RA Volume:   24.40 ml  13.01 ml/m LA Vol (A4C):   39.3 ml 20.95 ml/m LA Biplane Vol: 36.9 ml 19.67 ml/m  AORTIC VALVE LVOT Vmax:   77.70 cm/s LVOT Vmean:  56.000 cm/s LVOT VTI:    0.179 m  AORTA Ao Root diam: 3.60 cm Ao Asc diam:  3.60 cm MITRAL VALVE                TRICUSPID VALVE MV Area (PHT): 4.10 cm     TR Peak grad:   12.1 mmHg MV Decel Time: 185 msec     TR Vmax:        174.00 cm/s MV E velocity: 44.10 cm/s MV A velocity: 114.00 cm/s  SHUNTS MV E/A ratio:  0.39         Systemic VTI:  0.18 m                             Systemic Diam: 2.10 cm Kardie Tobb DO Electronically signed by Berniece Salines DO Signature Date/Time: 05/24/2022/11:06:49 AM    Final    DG Chest Port 1 View  Result Date: 05/24/2022 CLINICAL DATA:  Chest pain EXAM:  PORTABLE CHEST 1 VIEW COMPARISON:  Chest x-ray dated May 23, 2022 FINDINGS: The heart size and mediastinal contours are within normal limits. Both lungs are clear. The visualized skeletal structures are unremarkable. IMPRESSION: No active disease. Electronically Signed   By: Yetta Glassman M.D.   On: 05/24/2022 08:33   CARDIAC CATHETERIZATION  Result Date: 05/23/2022   Prox RCA lesion is 50% stenosed.  Tortuous portion of the vessel which has progressed since 2013.   Mid LAD lesion is 25% stenosed.   2nd Diag lesion is 25% stenosed.   Prox LAD lesion is 50% stenosed.  RFR 0.95.  This area has progressed since 2013.   Ost Cx to Prox Cx lesion is 90% stenosed.   Scoring balloon angioplasty was performed using a BALLN WOLVERINE 2.50X10.   Post intervention, there is a 10% residual stenosis.   The left ventricular systolic function is normal.   LV end diastolic pressure is normal.   The left  ventricular ejection fraction is 55-65% by visual estimate.   There is no aortic valve stenosis.   Tortuosity noted in the right subclavian.  Somewhat difficult to torque catheters without the benefit of a destination sheath which was not available today. Successful PTCA of the severe in-stent restenosis of the proximal circumflex stent.  Continue with dual antiplatelet therapy for 12 months.  Continue aggressive secondary prevention.  Results conveyed to the patient's son,  Jodi Mourning [id number].  Plan for discharge tomorrow.   DG Chest 2 View  Result Date: 05/23/2022 CLINICAL DATA:  Midsternal chest pain with nausea for 4 months. EXAM: CHEST - 2 VIEW COMPARISON:  Chest radiographs 02/01/2016 FINDINGS: Cardiac silhouette and mediastinal contours are within normal limits. The lungs are clear. No pleural effusion or pneumothorax. Minimal multilevel degenerative disc changes of the thoracic spine. IMPRESSION: No active cardiopulmonary disease. Electronically Signed   By: Yvonne Kendall M.D.   On: 05/23/2022 11:59      Assessment and Plan:   CAD s/p PCI to ramus, Lcx, RCA (2002,2004,2006) and recent balloon angioplasty to restent stenosis of proximal circumflex stent (2024) Chest pain  Patient with ongoing symptoms of chest pain as mentioned in previous discharge summary yesterday.  All labs and imaging so far have been reassuring with no indication of an acute event or post operative complication. EKG showing normal sinus rhythm with no ischemic changes.  Troponins continue to trend down and are 394.  Previously patient had continued chest pain after heart catheterization and in the setting of emotional stress in which he became tearful.  I think it is likely this is related and contributing to possible hypersensitivity of chest pain.  I discussed with patient some chest pain is to be expected but advised specific red flag symptoms that would warrant further evaluation such as chest pain accompanied with shortness of breath, nausea, that radiates, and is not relieved by nitro, etc. Plan is to continue with medical therapy and will follow-up outpatient. Continue dual antiplatelet therapy with Brilinta 90 mg daily and and aspirin 81 mg daily for 12 months Currently on rosuvastatin 20 mg daily, Zetia 10 mg daily Has Imdur 60 mg daily and nitro as needed. If having continued chest pain, can consider increasing imdur dose to 90mg  daily.   Ischemic cardiomyopathy  Echo measured EF to be 45-50% with mildly reduced function and regional wall motion abnormalities. G1DD.  Repeat echo in 3 months   Hypertension Currently on metoprolol 100 mg daily and olmesartan 20 mg daily  Hyperlipidemia HDL 38, LDL 58, triglycerides 38 Continue rosuvastatin 20 mg daily and Zetia 10 mg daily.  Risk Assessment/Risk Scores:  \ TIMI Risk Score for Unstable Angina or Non-ST Elevation MI:   The patient's TIMI risk score is 6, which indicates a 41% risk of all cause mortality, new or recurrent myocardial infarction or need for  urgent revascularization in the next 14 days.{  For questions or updates, please contact [place] Please consult [email or web address] for contact info under    Signed, Bonnee Quin, PA-C  05/25/2022 1:10 PM  I have examined the patient and reviewed assessment and plan and discussed with patient.  Agree with above as stated.    Recurrent atypical chest pain.  Relieved with nitroglycerin.  Not related to exertion.  He has walked since leaving the hospital yesterday and did not have any problems.  He still feels a lot of stress like he mentioned yesterday.  He has a lot of responsibilities in his  life.  We talked about the reassuring findings of no change on the ECG.  His troponin has dropped significantly from when he left the hospital.  We discussed options of further evaluation including cardiac cath versus continued medical therapy.  His blood pressure is somewhat elevated.  We will try to optimize medications.  Increase isosorbide to 60 mg daily.  Increase olmesartan to 40 mg daily.  Follow-up bmet on April 4.   Larae Grooms

## 2022-05-25 NOTE — Discharge Instructions (Addendum)
At this time there does not appear to be the presence of an emergent medical condition, however there is always the potential for conditions to change. Please read and follow the below instructions.  Please return to the Emergency Department immediately for any new or worsening symptoms. Please be sure to follow up with your Primary Care Provider within one week regarding your visit today; please call their office to schedule an appointment even if you are feeling better for a follow-up visit. Please follow Dr. Irish Lack instructions and see him for your follow-up appointment.  If anything gets worse please return immediately to the emergency department.   Please read the additional information packets attached to your discharge summary.  Go to the nearest Emergency Department immediately if: You have fever or chills Your chest pain returns You have a cough that gets worse, or you cough up blood. You have very bad (severe) pain in your belly (abdomen). You pass out (faint). You have either of these for no clear reason: Sudden chest discomfort. Sudden discomfort in your arms, back, neck, or jaw. You have shortness of breath at any time. You suddenly start to sweat, or your skin gets clammy. You feel sick to your stomach (nauseous). You throw up (vomit). You suddenly feel lightheaded or dizzy. You feel very weak or tired. Your heart starts to beat fast, or it feels like it is skipping beats. You have any new/concerning or worsening of symptoms  Do not take your medicine if  develop an itchy rash, swelling in your mouth or lips, or difficulty breathing; call 911 and seek immediate emergency medical attention if this occurs.  You may review your lab tests and imaging results in their entirety on your MyChart account.  Please discuss all results of fully with your primary care provider and other specialist at your follow-up visit.  Note: Portions of this text may have been transcribed using  voice recognition software. Every effort was made to ensure accuracy; however, inadvertent computerized transcription errors may still be present.

## 2022-05-25 NOTE — ED Provider Notes (Signed)
[facility] Provider Note   CSN: [id number] Arrival date & time: 05/25/22  2595     History  Chief Complaint  Patient presents with   Chest Pain    [name] is a 67 y.o. male history includes CAD/NSTEMI, CML, hypertension, hyperlipidemia, prostate cancer.  Patient presented to the ER for evaluation of chest pain.  Patient suffered NSTEMI 2 days ago and was taken for angioplasty by Dr. Irish Lack.  Patient reports that he was discharged yesterday, this morning he had several episodes of a sharp central chest pain described as a strange pressure-like sensation that does not radiate.  Patient has taken a total of 3 nitro as well as 5 aspirin prior to arrival.  He reports compliance with all of his medications.  He denies any fever, nausea, vomiting, abdominal pain, diarrhea, extremity swelling/color change, cocaine/drug use or any additional concerns. HPI     Home Medications Prior to Admission medications   Medication Sig Start Date End Date Taking? Authorizing Provider  acetaminophen (TYLENOL) 325 MG tablet Take 2 tablets (650 mg total) by mouth every 4 (four) hours as needed for headache or mild pain. 05/24/22   Raiford Noble Latif, DO  Ascorbic Acid (VITAMIN C) 1000 MG tablet Take 1,000 mg by mouth daily.    [provider]  aspirin 81 MG chewable tablet Chew 1 tablet (81 mg total) by mouth daily. 05/25/22   Raiford Noble Latif, DO  cholecalciferol (VITAMIN D) 1000 UNITS tablet Take 5,000 Units by mouth daily.     [provider]  Coenzyme Q10 (COQ10) 100 MG CAPS Take 100 mg by mouth daily.    [provider]  Cyanocobalamin (VITAMIN B 12 PO) Take by mouth.    [provider]  cyclobenzaprine (FLEXERIL) 10 MG tablet Take 10 mg by mouth at bedtime as needed.    [provider]  darifenacin (ENABLEX) 15 MG 24 hr tablet Take 15 mg by mouth daily.     [provider]  DEXILANT 60 MG  capsule TAKE 1 CAPSULE BY MOUTH ONCE DAILY 01/03/18   [name], MD  ezetimibe (ZETIA) 10 MG tablet Take 10 mg by mouth daily. 01/16/22   [provider]  famotidine (PEPCID) 20 MG tablet Take 20 mg by mouth 2 (two) times daily. 05/22/22 05/22/23  [provider]  hydrOXYzine (ATARAX/VISTARIL) 25 MG tablet Take 1 tablet (25 mg total) by mouth 2 (two) times daily. 12/05/17   [name], MD  isosorbide mononitrate (IMDUR) 60 MG 24 hr tablet TAKE 1 TABLET BY MOUTH ONCE DAILY 05/27/17   Belva Crome, MD  metoprolol succinate (TOPROL-XL) 100 MG 24 hr tablet TAKE 1 TABLET BY MOUTH WITH MEALS OR IMMEDIATELY FOLLOWING 05/27/17   Belva Crome, MD  MYRBETRIQ 50 MG TB24 tablet Take 50 mg by mouth daily. 12/25/21   [provider]  nilotinib (TASIGNA) 200 MG capsule Take 400 mg by mouth daily. 09/22/21 09/22/22  [provider]  nitroGLYCERIN (NITROSTAT) 0.4 MG SL tablet Place 1 tablet (0.4 mg total) under the tongue every 5 (five) minutes as needed for chest pain. 05/24/22   Sheikh, Omair Latif, DO  olmesartan (BENICAR) 40 MG tablet Take 1 tablet (40 mg total) by mouth daily. 05/25/22   Bonnee Quin, PA-C  Omega-3 Fatty Acids (FISH OIL PO) Take by mouth.    [provider]  OZEMPIC, 0.25 OR 0.5 MG/DOSE, 2 MG/3ML SOPN Inject 0.5 mg into the  skin once a week. 12/13/21   [provider]  rosuvastatin (CRESTOR) 20 MG tablet TAKE 1 TABLET BY MOUTH ONCE DAILY 05/27/17   Belva Crome, MD  sucralfate (CARAFATE) 1 GM/10ML suspension Take 10 mLs by mouth 4 (four) times daily. 05/11/22   [provider]  ticagrelor (BRILINTA) 90 MG TABS tablet Take 1 tablet (90 mg total) by mouth 2 (two) times daily. 05/24/22   Raiford Noble Latif, DO  VITAMIN E PO Take 1 tablet by mouth daily.    [provider]      Allergies    Contrast media [iodinated contrast media]    Review of Systems   Review of Systems Ten systems are reviewed and are negative for  acute change except as noted in the HPI  Physical Exam Updated Vital Signs BP (!) 128/100   Pulse 87   Temp 98 F (36.7 C) (Oral)   Resp 16   SpO2 98%  Physical Exam Constitutional:      General: He is not in acute distress.    Appearance: Normal appearance. He is well-developed. He is not ill-appearing or diaphoretic.  HENT:     Head: Normocephalic and atraumatic.  Eyes:     General: Vision grossly intact. Gaze aligned appropriately.     Pupils: Pupils are equal, round, and reactive to light.  Neck:     Trachea: Trachea and phonation normal.  Cardiovascular:     Rate and Rhythm: Normal rate and regular rhythm.     Pulses:          Dorsalis pedis pulses are 2+ on the right side and 2+ on the left side.     Heart sounds: Normal heart sounds.  Pulmonary:     Effort: Pulmonary effort is normal. No respiratory distress.     Breath sounds: Normal breath sounds.  Chest:     Chest wall: No tenderness.  Abdominal:     General: There is no distension.     Palpations: Abdomen is soft.     Tenderness: There is no abdominal tenderness. There is no guarding or rebound.  Musculoskeletal:        General: Normal range of motion.     Cervical back: Normal range of motion.     Right lower leg: No tenderness. No edema.     Left lower leg: No tenderness. No edema.  Skin:    General: Skin is warm and dry.  Neurological:     Mental Status: He is alert.     GCS: GCS eye subscore is 4. GCS verbal subscore is 5. GCS motor subscore is 6.     Comments: Speech is clear and goal oriented, follows commands Major Cranial nerves without deficit, no facial droop Moves extremities without ataxia, coordination intact  Psychiatric:        Behavior: Behavior normal.     ED Results / Procedures / Treatments   Labs (all labs ordered are listed, but only abnormal results are displayed) Labs Reviewed  BASIC METABOLIC PANEL - Abnormal; Notable for the following components:      Result Value    Glucose, Bld 108 (*)    All other components within normal limits  CBC - Abnormal; Notable for the following components:   WBC 11.1 (*)    All other components within normal limits  TROPONIN I (HIGH SENSITIVITY) - Abnormal; Notable for the following components:   Troponin I (High Sensitivity) 393 (*)    All other components within normal  limits  TROPONIN I (HIGH SENSITIVITY) - Abnormal; Notable for the following components:   Troponin I (High Sensitivity) 395 (*)    All other components within normal limits    EKG EKG Interpretation  Date/Time:  Friday May 25 2022 08:47:14 EDT Ventricular Rate:  84 PR Interval:  162 QRS Duration: 101 QT Interval:  372 QTC Calculation: 440 R Axis:   22 Text Interpretation: Sinus rhythm Posterior infarct, old Borderline T abnormalities, inferior leads No significant change since last tracing Confirmed by Garnette Gunner [id number]) on 05/25/2022 8:55:34 AM  Radiology DG Chest Port 1 View  Result Date: 05/25/2022 CLINICAL DATA:  67 year old male with midline chest pain since yesterday. EXAM: PORTABLE CHEST 1 VIEW COMPARISON:  Portable chest 05/24/2022 and earlier. FINDINGS: Portable AP semi upright view at 0853 hours. Lung volumes and mediastinal contours are normal. Visualized tracheal air column is within normal limits. Allowing for portable technique the lungs are clear. No pneumothorax or pleural effusion. No acute osseous abnormality identified. Negative visible bowel gas. IMPRESSION: Negative portable chest. Electronically Signed   By: Genevie Ann M.D.   On: 05/25/2022 08:58   ECHOCARDIOGRAM COMPLETE  Result Date: 05/24/2022    ECHOCARDIOGRAM REPORT   Patient Name:   [name] Date of Exam: 05/24/2022 Medical Rec #:  [id number]         Height:       67.0 in Accession #:    [id number]        Weight:       167.6 lb Date of Birth:  [birth year]          BSA:          1.876 m Patient Age:    9 years          BP:           146/105 mmHg Patient Gender: M                  HR:           98 bpm. Exam Location:  Inpatient Procedure: 2D Echo, Color Doppler, Cardiac Doppler and Intracardiac            Opacification Agent Indications:    R07.9* Chest pain, unspecified  History:        Patient has prior history of Echocardiogram examinations. CAD                 and Previous Myocardial Infarction; Risk Factors:Hypertension                 and Dyslipidemia.  Sonographer:    Phineas Douglas Referring Phys: [id number] Charlann Lange VARANASI IMPRESSIONS  1. Left ventricular ejection fraction, by estimation, is 45 to 50%. The left ventricle has mildly decreased function. The left ventricle demonstrates regional wall motion abnormalities (see scoring diagram/findings for description). Left ventricular diastolic parameters are consistent with Grade I diastolic dysfunction (impaired relaxation).  2. Right ventricular systolic function is normal. The right ventricular size is normal. There is normal pulmonary artery systolic pressure.  3. The mitral valve is normal in structure. No evidence of mitral valve regurgitation. No evidence of mitral stenosis.  4. The aortic valve is normal in structure. Aortic valve regurgitation is not visualized. No aortic stenosis is present.  5. The inferior vena cava is normal in size with greater than 50% respiratory variability, suggesting right atrial pressure of 3 mmHg. FINDINGS  Left Ventricle: Left ventricular ejection fraction, by estimation, is 45 to 50%. The  left ventricle has mildly decreased function. The left ventricle demonstrates regional wall motion abnormalities. Definity contrast agent was given IV to delineate the left ventricular endocardial borders. The left ventricular internal cavity size was normal in size. There is no left ventricular hypertrophy. Left ventricular diastolic parameters are consistent with Grade I diastolic dysfunction (impaired relaxation).  LV Wall Scoring: The entire anterior septum, apical lateral segment, apical anterior  segment, and apex are akinetic. The anterior wall, antero-lateral wall, entire inferior wall, posterior wall, mid inferoseptal segment, and basal inferoseptal segment are hypokinetic. Right Ventricle: The right ventricular size is normal. No increase in right ventricular wall thickness. Right ventricular systolic function is normal. There is normal pulmonary artery systolic pressure. The tricuspid regurgitant velocity is 1.74 m/s, and  with an assumed right atrial pressure of 3 mmHg, the estimated right ventricular systolic pressure is 0000000 mmHg. Left Atrium: Left atrial size was normal in size. Right Atrium: Right atrial size was normal in size. Pericardium: There is no evidence of pericardial effusion. Mitral Valve: The mitral valve is normal in structure. No evidence of mitral valve regurgitation. No evidence of mitral valve stenosis. Tricuspid Valve: The tricuspid valve is normal in structure. Tricuspid valve regurgitation is trivial. No evidence of tricuspid stenosis. Aortic Valve: The aortic valve is normal in structure. Aortic valve regurgitation is not visualized. No aortic stenosis is present. Pulmonic Valve: The pulmonic valve was normal in structure. Pulmonic valve regurgitation is not visualized. No evidence of pulmonic stenosis. Aorta: The aortic root is normal in size and structure. Venous: The inferior vena cava is normal in size with greater than 50% respiratory variability, suggesting right atrial pressure of 3 mmHg. IAS/Shunts: No atrial level shunt detected by color flow Doppler.  LEFT VENTRICLE PLAX 2D LVIDd:         5.40 cm      Diastology LVIDs:         4.20 cm      LV e' medial:    5.44 cm/s LV PW:         0.90 cm      LV E/e' medial:  8.1 LV IVS:        0.90 cm      LV e' lateral:   8.49 cm/s LVOT diam:     2.10 cm      LV E/e' lateral: 5.2 LV SV:         62 LV SV Index:   33 LVOT Area:     3.46 cm  LV Volumes (MOD) LV vol d, MOD A2C: 143.0 ml LV vol d, MOD A4C: 138.0 ml LV vol s, MOD A2C:  83.6 ml LV vol s, MOD A4C: 77.1 ml LV SV MOD A2C:     59.4 ml LV SV MOD A4C:     138.0 ml LV SV MOD BP:      61.0 ml RIGHT VENTRICLE             IVC RV Basal diam:  3.00 cm     IVC diam: 1.50 cm RV S prime:     14.40 cm/s TAPSE (M-mode): 1.9 cm LEFT ATRIUM             Index        RIGHT ATRIUM           Index LA diam:        3.70 cm 1.97 cm/m   RA Area:     11.70 cm LA Vol (A2C):   31.6  ml 16.85 ml/m  RA Volume:   24.40 ml  13.01 ml/m LA Vol (A4C):   39.3 ml 20.95 ml/m LA Biplane Vol: 36.9 ml 19.67 ml/m  AORTIC VALVE LVOT Vmax:   77.70 cm/s LVOT Vmean:  56.000 cm/s LVOT VTI:    0.179 m  AORTA Ao Root diam: 3.60 cm Ao Asc diam:  3.60 cm MITRAL VALVE                TRICUSPID VALVE MV Area (PHT): 4.10 cm     TR Peak grad:   12.1 mmHg MV Decel Time: 185 msec     TR Vmax:        174.00 cm/s MV E velocity: 44.10 cm/s MV A velocity: 114.00 cm/s  SHUNTS MV E/A ratio:  0.39         Systemic VTI:  0.18 m                             Systemic Diam: 2.10 cm Kardie Tobb DO Electronically signed by Berniece Salines DO Signature Date/Time: 05/24/2022/11:06:49 AM    Final    DG Chest Port 1 View  Result Date: 05/24/2022 CLINICAL DATA:  Chest pain EXAM: PORTABLE CHEST 1 VIEW COMPARISON:  Chest x-ray dated May 23, 2022 FINDINGS: The heart size and mediastinal contours are within normal limits. Both lungs are clear. The visualized skeletal structures are unremarkable. IMPRESSION: No active disease. Electronically Signed   By: Yetta Glassman M.D.   On: 05/24/2022 08:33   CARDIAC CATHETERIZATION  Result Date: 05/23/2022   Prox RCA lesion is 50% stenosed.  Tortuous portion of the vessel which has progressed since 2013.   Mid LAD lesion is 25% stenosed.   2nd Diag lesion is 25% stenosed.   Prox LAD lesion is 50% stenosed.  RFR 0.95.  This area has progressed since 2013.   Ost Cx to Prox Cx lesion is 90% stenosed.   Scoring balloon angioplasty was performed using a BALLN WOLVERINE 2.50X10.   Post intervention, there is a 10%  residual stenosis.   The left ventricular systolic function is normal.   LV end diastolic pressure is normal.   The left ventricular ejection fraction is 55-65% by visual estimate.   There is no aortic valve stenosis.   Tortuosity noted in the right subclavian.  Somewhat difficult to torque catheters without the benefit of a destination sheath which was not available today. Successful PTCA of the severe in-stent restenosis of the proximal circumflex stent.  Continue with dual antiplatelet therapy for 12 months.  Continue aggressive secondary prevention.  Results conveyed to the patient's son,  [name] [id number].  Plan for discharge tomorrow.    Procedures Procedures    Medications Ordered in ED Medications - No data to display  ED Course/ Medical Decision Making/ A&P Clinical Course as of 05/25/22 1502  Fri May 25, 2022  1029 Cardiology to come and see pt [BM]  1238 Patient reassessed resting company bed no acute distress, vital signs stable remains chest pain-free.  Awaiting cardiology evaluation. [BM]  1430 Patient seen and evaluated by cardiology they are changing his medications up and discharging him to follow-up outpatient for further management. [BM]    Clinical Course User Index [BM] Gari Crown                             Medical Decision Making 67 year old male  history as above presented with chest pain onset this morning he is 2 days status post angioplasty, discharged yesterday morning.  He has taken 3 nitro prior to arrival as well as 5 baby aspirin.  He is chest pain-free on my evaluation.  Well-appearing in no acute distress vital signs are stable.  Will obtain chest pain workup and plan to consult cardiology.  Differential includes but not limited to ACS, dissection, PNA, PTX. Plan discussed w/ attending physician Dr. Truett Mainland who agrees with plan.  Amount and/or Complexity of Data Reviewed Labs: ordered.    Details: Initial high-sensitivity troponin 393, delta  395.  Flat and improving since admission 2 days ago. CBC shows mild looks ptosis of 11.1, no anemia or thrombocytopenia. BMP without emergent electrolyte derangement, AKI or gap. Radiology: ordered.    Details: Reviewed and interpreted patient's 1 view AP chest x-ray.  I do not appreciate any obvious PTX, PNA or effusion. ECG/medicine tests:     Details: I have personally reviewed and interpreted patient's twelve-lead EKG.  I do not appreciate any obvious acute ischemic changes.  Reviewed with Dr. Truett Mainland.    Low suspicion for PTX, PNA, effusion, dissection, Boerhaave's or other emergent pathology at this time.  Patient cleared by cardiology.  Patient seen evaluate by cardiology Dr. Irish Lack and Loni Beckwith today they recommend discharge with outpatient follow-up.  They have altered patient's medications.  I reevaluated the patient he is resting comfortably bed no acute distress, no recurrence of chest pain.  He states understanding of cardiology's care plan, he has no additional questions or concerns.  I discussed strict ER precautions in regards to chest pain with the patient he stated understanding.   At this time there does not appear to be any evidence of an acute emergency medical condition and the patient appears stable for discharge with appropriate outpatient follow up. Diagnosis was discussed with patient who verbalizes understanding of care plan and is agreeable to discharge. I have discussed return precautions with patient who verbalizes understanding. Patient encouraged to follow-up with their PCP and cardiology. All questions answered.  Patient's case discussed with Dr. Truett Mainland who agrees with plan to discharge with follow-up.   Note: Portions of this report may have been transcribed using voice recognition software. Every effort was made to ensure accuracy; however, inadvertent computerized transcription errors may still be present.         Final Clinical Impression(s) / ED  Diagnoses Final diagnoses:  Chest pain, unspecified type    Rx / DC Orders ED Discharge Orders          Ordered    olmesartan (BENICAR) 40 MG tablet  Daily       Note to Pharmacy: D/c irbesartan   05/25/22 1441              Gari Crown 05/25/22 1528    Cristie Hem, MD 05/26/22 [phone]

## 2022-05-25 NOTE — ED Triage Notes (Signed)
CP started around 0600 while sitting in bed. Dull pain in center of chest. Initially pain was 3/10, patient took 3 nitro prior to EMS arrival and 5 baby aspirin. Chest pain resolved upon arrival to ED. Hx of 7x MI, most recently on Wednesday with angioplasty to recent stent.

## 2022-06-06 NOTE — Progress Notes (Unsigned)
Office Visit    Patient Name: [name] Date of Encounter: 06/07/2022  PCP:  Gerald Leitz   [place]  Cardiologist:  Larae Grooms, MD  Advanced Practice Provider:  No care team member to display Electrophysiologist:  None   HPI    [name] is a 67 y.o. male with a medical history significant for CAD/NSTEMI, CML, hypertension, hyperlipidemia, prostate cancer presents today for ED follow-up.  Presented to the ER 3/20 for evaluation of chest pain.  Had chest discomfort and pain for the last 4 months but progressively worsened.  Initially thought to be attributed to GERD.  At that time patient denied nausea, vomiting, lightheadedness or dizziness.  No diaphoresis but does have midsternal chest discomfort that radiates across the chest from shoulder to shoulder.  Treated for reflux and evaluated by his GI physician who wanted to perform an endoscopy.  Patient had been taking medicines without improvement and associated nausea worsening with the chest discomfort.  States that pain did not radiate to back or abdomen.  Originally evaluated at [facility] but given elevated troponins was transferred to [facility] for further evaluation.  Plan for cardiac catheterization per cardiology with concern for NSTEMI.  Placed on a heparin drip.  In the Cath Lab, was found to have severe in-stent stenosis of his proximal circumflex stent and PTCA was performed and recommended dual antiplatelet therapy.  Echo showed LVEF 45 to 50%, grade 1 DD, left ventricular regional wall motion abnormalities.  Troponin trending down and discharge was appropriate at that time.  The patient presented to the ER for evaluation of chest pain on 05/25/2022.  Patient suffered from NSTEMI 2 days ago and was taken for angioplasty by Dr. Irish Lack.  Patient reported that he was discharged on 21 March.  That morning following discharge she had several episodes of sharp  central chest pain described as a strange pressure-like sensation that did not radiate.  Patient took 3 nitros as well as 5 aspirins prior to arrival.  He reported compliance with all his medications.  Initial high-sensitivity troponin 393, delta 395.  Flat and improving since admission 2 days prior.  EKG reviewed without any obvious acute ischemic changes.  Evaluated by Dr. Irish Lack and Hildred Alamin, PA-C and they recommended discharge with outpatient follow-up.  They altered the patient's medications.  Most recent records show aspirin 81 mg daily, Zetia 10 mg daily, metoprolol succinate 100 mg daily, nitro as needed, Benicar 40 mg daily, rosuvastatin 20 mg daily, Brilinta 90 mg twice a day.  Today, he tells me that he understands the difference of soreness in his chest and chest pain.  He is only taking 50 mg daily of his metoprolol and his heart rate is in the 90s today.  We have increased to 75 mg daily.  He endorses lightheadedness/dizziness when he stands up too quickly.  Blood pressure is low normal today 106/70.  We discussed reducing his Benicar for more blood pressure room.  He states he is having trouble eating because of his taste buds being affected.  He stopped smoking cigarettes back in 2002 but he has been smoking marijuana for 50+ years.  He is having a hard time with quitting marijuana.  Asking me about edibles today and cardiovascular risk.  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  He is interested in cardiac rehab and we will resend the referral in today.  Reports no shortness of breath nor dyspnea on exertion. Reports no chest pain, pressure, or tightness. No edema, orthopnea, PND. Reports no palpitations.    Past Medical History    Past Medical History:  Diagnosis Date   Arthritis    Bladder disorder    Cancer    hx prostate cancer and CML   Chronic myelocytic  leukemia    Coronary artery disease    with prior MI 2002([facility]) and Cfx(2006) and RCA (2004) stents . EF 45-50%   Coronary atherosclerosis of native coronary artery    stable and suspect a component of CAS   Diverticulosis    seen on colonoscopy in 2008   Goals of care, counseling/discussion 09/13/2016   H/O colonoscopy 2014   Hyperlipidemia    Hypertension    Myocardial infarction 02,6/12   Prostate cancer    Past Surgical History:  Procedure Laterality Date   CARDIAC CATHETERIZATION     normal EF, patent stents without obstructive disease (on Plavix X 1 month only)   CAROTID STENT     CORONARY BALLOON ANGIOPLASTY N/A 05/23/2022   Procedure: CORONARY BALLOON ANGIOPLASTY;  Surgeon: Jettie Booze, MD;  Location: [place] [facility];  Service: Cardiovascular;  Laterality: N/A;   CORONARY PRESSURE/FFR STUDY N/A 05/23/2022   Procedure: INTRAVASCULAR PRESSURE WIRE/FFR STUDY;  Surgeon: Jettie Booze, MD;  Location: [place] [facility];  Service: Cardiovascular;  Laterality: N/A;   FRACTURE SURGERY     rt wrist   LEFT HEART CATH AND CORONARY ANGIOGRAPHY N/A 05/23/2022   Procedure: LEFT HEART CATH AND CORONARY ANGIOGRAPHY;  Surgeon: Jettie Booze, MD;  Location: [place] [facility];  Service: Cardiovascular;  Laterality: N/A;   LEFT HEART CATHETERIZATION WITH CORONARY ANGIOGRAM N/A 02/04/2012   Procedure: LEFT HEART CATHETERIZATION WITH CORONARY ANGIOGRAM;  Surgeon: Sinclair Grooms, MD;  Location: [facility];  Service: Cardiovascular;  Laterality: N/A;   ORIF ANKLE FRACTURE  07/04/2011   Procedure: OPEN REDUCTION INTERNAL FIXATION (ORIF) ANKLE FRACTURE;  Surgeon: Alta Corning, MD;  Location: [place];  Service: Orthopedics;  Laterality: Right;   TOE SURGERY     rt great toe   TONSILLECTOMY     WRIST SURGERY     rt and lt cysts removed    Allergies  Allergies  Allergen Reactions   Contrast Media [Iodinated Contrast Media] Hives    "a few hives; not a  lot"    EKGs/Labs/Other Studies Reviewed:   The following studies were reviewed today:  Cardiac cath 05/23/22 Prox RCA lesion is 50% stenosed.  Tortuous portion of the vessel which has progressed since 2013.   Mid LAD lesion is 25% stenosed.   2nd Diag lesion is 25% stenosed.   Prox LAD lesion is 50% stenosed.  RFR 0.95.  This area has progressed since 2013.   Ost Cx to Prox Cx lesion is 90% stenosed.   Scoring balloon angioplasty was performed using a BALLN WOLVERINE 2.50X10.   Post intervention, there is a 10% residual stenosis.   The left ventricular systolic function is normal.   LV end diastolic pressure is normal.   The left ventricular ejection fraction is 55-65% by visual estimate.   There is no aortic valve stenosis.   Tortuosity noted in the right subclavian.  Somewhat difficult to torque catheters without the benefit of a destination sheath which was  not available today.   Successful PTCA of the severe in-stent restenosis of the proximal circumflex stent.  Continue with dual antiplatelet therapy for 12 months.  Continue aggressive secondary prevention.  Results conveyed to the patient's son,  Jodi Mourning [id number].  Plan for discharge tomorrow.   Coronary Diagrams  Diagnostic Dominance: Right  Intervention    Implants    EKG:  EKG is not ordered today.    Recent Labs: 05/24/2022: ALT 21; Magnesium 1.9 05/25/2022: BUN 16; Creatinine, Ser 1.15; Hemoglobin 16.2; Platelets 243; Potassium 3.5; Sodium 137  Recent Lipid Panel    Component Value Date/Time   CHOL 104 05/24/2022 0147   CHOL 161 12/19/2017 1059   TRIG 38 05/24/2022 0147   HDL 38 (L) 05/24/2022 0147   HDL 51 12/19/2017 1059   CHOLHDL 2.7 05/24/2022 0147   VLDL 8 05/24/2022 0147   LDLCALC 58 05/24/2022 0147   LDLCALC 97 12/19/2017 1059    Home Medications   Current Meds  Medication Sig   acetaminophen (TYLENOL) 325 MG tablet Take 2 tablets (650 mg total) by mouth every 4 (four) hours as needed for  headache or mild pain.   Ascorbic Acid (VITAMIN C) 1000 MG tablet Take 1,000 mg by mouth daily.   aspirin 81 MG chewable tablet Chew 1 tablet (81 mg total) by mouth daily.   cholecalciferol (VITAMIN D) 1000 UNITS tablet Take 5,000 Units by mouth daily.    Coenzyme Q10 (COQ10) 100 MG CAPS Take 100 mg by mouth daily. Per patient taking 200 mg   cyclobenzaprine (FLEXERIL) 10 MG tablet Take 10 mg by mouth at bedtime as needed.   darifenacin (ENABLEX) 15 MG 24 hr tablet Take 15 mg by mouth daily.    DEXILANT 60 MG capsule TAKE 1 CAPSULE BY MOUTH ONCE DAILY   ezetimibe (ZETIA) 10 MG tablet Take 10 mg by mouth daily.   famotidine (PEPCID) 20 MG tablet Take 20 mg by mouth 2 (two) times daily.   hydrOXYzine (ATARAX/VISTARIL) 25 MG tablet Take 1 tablet (25 mg total) by mouth 2 (two) times daily.   isosorbide mononitrate (IMDUR) 60 MG 24 hr tablet TAKE 1 TABLET BY MOUTH ONCE DAILY   meloxicam (MOBIC) 15 MG tablet Take 1 tablet by mouth as needed.   MYRBETRIQ 50 MG TB24 tablet Take 50 mg by mouth daily.   nilotinib (TASIGNA) 200 MG capsule Take 400 mg by mouth daily. Per patient taking 200 mg daily   nitroGLYCERIN (NITROSTAT) 0.4 MG SL tablet Place 1 tablet (0.4 mg total) under the tongue every 5 (five) minutes as needed for chest pain.   Omega-3 Fatty Acids (FISH OIL PO) Take by mouth.   OZEMPIC, 0.25 OR 0.5 MG/DOSE, 2 MG/3ML SOPN Inject 0.5 mg into the skin once a week.   rosuvastatin (CRESTOR) 20 MG tablet TAKE 1 TABLET BY MOUTH ONCE DAILY   ticagrelor (BRILINTA) 90 MG TABS tablet Take 1 tablet (90 mg total) by mouth 2 (two) times daily. (Patient taking differently: Take 90 mg by mouth 2 (two) times daily. Per patient taking once daily. Will start taking twice a day)   VITAMIN E PO Take 1 tablet by mouth daily.   [DISCONTINUED] Cyanocobalamin (VITAMIN B 12 PO) Take by mouth.   [DISCONTINUED] metoprolol succinate (TOPROL-XL) 100 MG 24 hr tablet TAKE 1 TABLET BY MOUTH WITH MEALS OR IMMEDIATELY FOLLOWING    [DISCONTINUED] olmesartan (BENICAR) 40 MG tablet Take 1 tablet (40 mg total) by mouth daily.   [DISCONTINUED] sucralfate (CARAFATE) 1 GM/10ML  suspension Take 10 mLs by mouth 4 (four) times daily.     Review of Systems      All other systems reviewed and are otherwise negative except as noted above.  Physical Exam    VS:  BP 106/70   Pulse 91   Ht 5\' 7"  (1.702 m)   Wt 168 lb 12.8 oz (76.6 kg)   SpO2 99%   BMI 26.44 kg/m  , BMI Body mass index is 26.44 kg/m.  Wt Readings from Last 3 Encounters:  06/07/22 168 lb 12.8 oz (76.6 kg)  05/23/22 167 lb 9.6 oz (76 kg)  01/29/18 187 lb (84.8 kg)     GEN: Well nourished, well developed, in no acute distress. HEENT: normal. Neck: Supple, no JVD, carotid bruits, or masses. Cardiac: RRR, no murmurs, rubs, or gallops. No clubbing, cyanosis, edema.  Radials/PT 2+ and equal bilaterally.  Respiratory:  Respirations regular and unlabored, clear to auscultation bilaterally. GI: Soft, nontender, nondistended. MS: No deformity or atrophy. Skin: Warm and dry, no rash. Neuro:  Strength and sensation are intact. Psych: Normal affect.  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  May be able to return to work on a light-duty basis prior to completion of cardiac rehab  2. HLD -LDL 58, HDL 38, total cholesterol 104, triglycerides 38 -All numbers are at goal, continue current medications  3. HTN -Low normal today so we have decreased his Benicar -continue low sodium diet -continue to track BP at home  4. History of prostate cancer and CML in  remission -Not addressed today         Cardiac Rehabilitation Eligibility Assessment  The patient is ready to start cardiac rehabilitation from a cardiac standpoint.   Disposition: Follow up 3-4 months with Larae Grooms, MD or APP.  Signed, Elgie Collard, PA-C 06/07/2022, 5:32 PM [facility]

## 2022-06-07 ENCOUNTER — Ambulatory Visit: Payer: PPO | Attending: Physician Assistant | Admitting: Physician Assistant

## 2022-06-07 ENCOUNTER — Encounter: Payer: Self-pay | Admitting: Physician Assistant

## 2022-06-07 VITALS — BP 106/70 | HR 91 | Ht 67.0 in | Wt 168.8 lb

## 2022-06-07 DIAGNOSIS — I10 Essential (primary) hypertension: Secondary | ICD-10-CM | POA: Diagnosis not present

## 2022-06-07 DIAGNOSIS — K219 Gastro-esophageal reflux disease without esophagitis: Secondary | ICD-10-CM

## 2022-06-07 DIAGNOSIS — Z8546 Personal history of malignant neoplasm of prostate: Secondary | ICD-10-CM | POA: Diagnosis not present

## 2022-06-07 DIAGNOSIS — I214 Non-ST elevation (NSTEMI) myocardial infarction: Secondary | ICD-10-CM | POA: Diagnosis not present

## 2022-06-07 DIAGNOSIS — E785 Hyperlipidemia, unspecified: Secondary | ICD-10-CM

## 2022-06-07 MED ORDER — METOPROLOL SUCCINATE ER 50 MG PO TB24: 75.0000 mg | ORAL_TABLET | Freq: Every day | ORAL | 2 refills | Status: AC

## 2022-06-07 MED ORDER — OLMESARTAN MEDOXOMIL 20 MG PO TABS: 40.0000 mg | ORAL_TABLET | Freq: Every day | ORAL | 3 refills | Status: AC

## 2022-06-07 NOTE — Patient Instructions (Addendum)
Medication Instructions:   START TAKING:  BENICAR 20 MG ONCE A DAY   START TAKING :  TOPROL XL  75  MG ONCE  A DAY    *If you need a refill on your cardiac medications before your next appointment, please call your pharmacy*   Lab Work: NONE ORDERED  TODAY   If you have labs (blood work) drawn today and your tests are completely normal, you will receive your results only by: [place] (if you have MyChart) OR A paper copy in the mail If you have any lab test that is abnormal or we need to change your treatment, we will call you to review the results.   Testing/Procedures: NONE ORDERED  TODAY    Follow-Up: At [facility], you and your health needs are our priority.  As part of our continuing mission to provide you with exceptional heart care, we have created designated Provider Care Teams.  These Care Teams include your primary Cardiologist (physician) and Advanced Practice Providers (APPs -  Physician Assistants and Nurse Practitioners) who all work together to provide you with the care you need, when you need it.  We recommend signing up for the patient portal called "MyChart".  Sign up information is provided on this After Visit Summary.  MyChart is used to connect with patients for Virtual Visits (Telemedicine).  Patients are able to view lab/test results, encounter notes, upcoming appointments, etc.  Non-urgent messages can be sent to your provider as well.   To learn more about what you can do with MyChart, go to [email or web address].    Your next appointment:   3-4  month(s)  Provider:   Larae Grooms, MD     Other Instructions

## 2022-06-12 ENCOUNTER — Telehealth (HOSPITAL_COMMUNITY): Payer: Self-pay

## 2022-06-12 NOTE — Telephone Encounter (Signed)
Called patient to see if he was interested in participating in the Cardiac Rehab Program. Patient stated yes. Patient will come in for orientation on 06/19/22 @ 830AM and will attend the 645AM exercise class. Went over insurance, patient verbalized understanding.     Pensions consultant.

## 2022-06-12 NOTE — Telephone Encounter (Signed)
Pt insurance is active and benefits verified through [employer]. Co-pay $15.00, DED $0.00/$0.00 met, out of pocket $3,200.00/$533.46 met, co-insurance 0%. No pre-authorization required. Laureana R./HTA, 06/12/22 @ 5:53PM, GYJ#[id number]   How many CR sessions are covered? (36 sessions for TCR, 72 sessions for ICR)72 Is this a lifetime maximum or an annual maximum? Annual Has the member used any of these services to date? No Is there a time limit (weeks/months) on start of program and/or program completion? No     Will contact patient to see if he is interested in the Cardiac Rehab Program.

## 2022-06-15 ENCOUNTER — Telehealth (HOSPITAL_COMMUNITY): Payer: Self-pay

## 2022-06-15 NOTE — Telephone Encounter (Signed)
Reviewed with patient the Cardiac Rehab Cardiac Risk [place] Nursing Assessment. Patient knows where our office is located.  

## 2022-06-19 ENCOUNTER — Encounter (HOSPITAL_COMMUNITY): Payer: Self-pay

## 2022-06-19 ENCOUNTER — Encounter (HOSPITAL_COMMUNITY)
Admission: RE | Admit: 2022-06-19 | Discharge: 2022-06-19 | Disposition: A | Discharge status: Home / Self Care | Payer: PPO | Source: Ambulatory Visit | Attending: Interventional Cardiology | Admitting: Interventional Cardiology

## 2022-06-19 VITALS — BP 118/78 | HR 95 | Ht 68.0 in | Wt 173.3 lb

## 2022-06-19 DIAGNOSIS — Z9861 Coronary angioplasty status: Secondary | ICD-10-CM | POA: Diagnosis present

## 2022-06-19 DIAGNOSIS — I214 Non-ST elevation (NSTEMI) myocardial infarction: Secondary | ICD-10-CM | POA: Insufficient documentation

## 2022-06-19 LAB — GLUCOSE, CAPILLARY: Glucose-Capillary: 189 mg/dL — ABNORMAL HIGH (ref 70–99)

## 2022-06-19 NOTE — Progress Notes (Signed)
Cardiac Rehab Medication Review by a Nurse  Does the patient  feel that his/her medications are working for him/her?  yes  Has the patient been experiencing any side effects to the medications prescribed?  no  Does the patient measure his/her own blood pressure or blood glucose at home?  no   Does the patient have any problems obtaining medications due to transportation or finances?   no  Understanding of regimen: good Understanding of indications: good Potential of compliance: good    Nurse  comments: [name] is taking his medications as prescribed and has a good understanding of what his medications are for. [name] does not check his CBG's or blood pressures at home. Patient says he will look into buying a BP cuff.    [name] [facility] RN 06/19/2022 8:37 AM

## 2022-06-19 NOTE — Progress Notes (Signed)
Cardiac Individual Treatment Plan  Patient Details  Name: [name] MRN: [id number] Date of Birth: 1955-12-14 Referring Provider:   Flowsheet Row INTENSIVE CARDIAC REHAB ORIENT from 06/19/2022 in [facility]  Referring Provider Corky Crafts, MD       Initial Encounter Date:  Flowsheet Row INTENSIVE CARDIAC REHAB ORIENT from 06/19/2022 in [facility]  Date 06/19/22       Visit Diagnosis: 05/23/22 NSTEMI  05/23/22 S/P PTCA of of CFX  Patient's Home Medications on Admission:  Current Outpatient Medications:    acetaminophen (TYLENOL) 325 MG tablet, Take 2 tablets (650 mg total) by mouth every 4 (four) hours as needed for headache or mild pain., Disp: 20 tablet, Rfl: 0   Ascorbic Acid (VITAMIN C) 1000 MG tablet, Take 1,000 mg by mouth daily., Disp: , Rfl:    aspirin 81 MG chewable tablet, Chew 1 tablet (81 mg total) by mouth daily., Disp: 30 tablet, Rfl: 0   cholecalciferol (VITAMIN D) 1000 UNITS tablet, Take 5,000 Units by mouth daily. , Disp: , Rfl:    Coenzyme Q10 (COQ10) 100 MG CAPS, Take 100 mg by mouth daily. Per patient taking 200 mg, Disp: , Rfl:    cyclobenzaprine (FLEXERIL) 10 MG tablet, Take 10 mg by mouth at bedtime as needed., Disp: , Rfl:    darifenacin (ENABLEX) 15 MG 24 hr tablet, Take 15 mg by mouth daily. , Disp: , Rfl:    DEXILANT 60 MG capsule, TAKE 1 CAPSULE BY MOUTH ONCE DAILY, Disp: 90 capsule, Rfl: 1   ezetimibe (ZETIA) 10 MG tablet, Take 10 mg by mouth daily., Disp: , Rfl:    famotidine (PEPCID) 20 MG tablet, Take 20 mg by mouth 2 (two) times daily., Disp: , Rfl:    hydrOXYzine (ATARAX/VISTARIL) 25 MG tablet, Take 1 tablet (25 mg total) by mouth 2 (two) times daily., Disp: 90 tablet, Rfl: 0   isosorbide mononitrate (IMDUR) 60 MG 24 hr tablet, TAKE 1 TABLET BY MOUTH ONCE DAILY, Disp: 90 tablet, Rfl: 3   meloxicam (MOBIC) 15 MG  tablet, Take 1 tablet by mouth as needed., Disp: , Rfl:    metoprolol succinate (TOPROL-XL) 50 MG 24 hr tablet, Take 1.5 tablets (75 mg total) by mouth daily. Take with or immediately following a meal., Disp: 135 tablet, Rfl: 2   MYRBETRIQ 50 MG TB24 tablet, Take 50 mg by mouth daily., Disp: , Rfl:    nilotinib (TASIGNA) 200 MG capsule, Take 400 mg by mouth daily. Per patient taking 200 mg daily, Disp: , Rfl:    nitroGLYCERIN (NITROSTAT) 0.4 MG SL tablet, Place 1 tablet (0.4 mg total) under the tongue every 5 (five) minutes as needed for chest pain., Disp: 25 tablet, Rfl: 12   olmesartan (BENICAR) 20 MG tablet, Take 2 tablets (40 mg total) by mouth daily., Disp: 90 tablet, Rfl: 3   Omega-3 Fatty Acids (FISH OIL PO), Take by mouth., Disp: , Rfl:    OZEMPIC, 0.25 OR 0.5 MG/DOSE, 2 MG/3ML SOPN, Inject 0.5 mg into the skin once a week., Disp: , Rfl:    rosuvastatin (CRESTOR) 20 MG tablet, TAKE 1 TABLET BY MOUTH ONCE DAILY, Disp: 90 tablet, Rfl: 3   ticagrelor (BRILINTA) 90 MG TABS tablet, Take 1 tablet (90 mg total) by mouth 2 (two) times daily. (Patient taking differently: Take 90 mg by mouth 2 (two) times daily. Per patient taking once daily.  Will start taking twice a day), Disp: 60 tablet, Rfl: 0   VITAMIN E PO, Take 1 tablet by mouth daily., Disp: , Rfl:   Past Medical History: Past Medical History:  Diagnosis Date   Arthritis    Bladder disorder    Cancer    hx prostate cancer and CML   Chronic myelocytic leukemia    Coronary artery disease    with prior MI 2002([facility]) and Cfx(2006) and RCA (2004) stents . EF 45-50%   Coronary atherosclerosis of native coronary artery    stable and suspect a component of CAS   Diverticulosis    seen on colonoscopy in 2008   Goals of care, counseling/discussion 09/13/2016   H/O colonoscopy 2014   Hyperlipidemia    Hypertension    Myocardial infarction 02,6/12   Prostate cancer     Tobacco Use: Social History   Tobacco Use  Smoking Status Former    Types: Cigarettes   Quit date: 06/06/2000   Years since quitting: 22.0  Smokeless Tobacco Never  Tobacco Comments   2002    Labs: Review Flowsheet  More data exists      Latest Ref Rng & Units 07/22/2015 10/18/2015 05/28/2016 12/19/2017 05/24/2022  Labs for ITP Cardiac and Pulmonary Rehab  Cholestrol 0 - 200 mg/dL 098  119  147  829  562   LDL (calc) 0 - 99 mg/dL 130  78  92  97  58   HDL-C >40 mg/dL 44  40  42  51  38   Trlycerides <150 mg/dL 76  53  865  67  38   Hemoglobin A1c 4.8 - 5.6 % - - - 6.1  6.1     Capillary Blood Glucose: Lab Results  Component Value Date   GLUCAP 189 (H) 06/19/2022   GLUCAP 97 10/06/2010   GLUCAP 154 (H) 08/28/2010   GLUCAP 98 08/28/2010   GLUCAP 95 08/28/2010     Exercise Target Goals: Exercise Program Goal: Individual exercise prescription set using results from initial 6 min walk test and THRR while considering  patient's activity barriers and safety.   Exercise Prescription Goal: Initial exercise prescription builds to 30-45 minutes a day of aerobic activity, 2-3 days per week.  Home exercise guidelines will be given to patient during program as part of exercise prescription that the participant will acknowledge.  Activity Barriers & Risk Stratification:  Activity Barriers & Cardiac Risk Stratification - 06/19/22 0901       Activity Barriers & Cardiac Risk Stratification   Activity Barriers Arthritis;Other (comment);Neck/Spine Problems;Joint Problems    Comments Right ankle has 9 screws and a plate, snapped ligament in right arm and right finger- reattached, multiple ganglian cysts removed from right atm, hx fractured left wrist, ablation C3-C7 because of neck/shoulder/arm pain, hx torn left hamstrin, bone removal right great toe. Really bad arthritis in both hands/fingers, limited flexibility both wrists.    Cardiac Risk Stratification High             6 Minute Walk:  6 Minute Walk     Row Name 06/19/22 0915         6 Minute  Walk   Phase Initial     Distance 1418 feet     Walk Time 6 minutes     # of Rest Breaks 0     MPH 2.68     METS 3.25     RPE 7     Perceived Dyspnea  0  VO2 Peak 11.37     Symptoms No     Resting HR 95 bpm     Resting BP 118/78     Resting Oxygen Saturation  99 %     Exercise Oxygen Saturation  during 6 min walk 98 %     Max Ex. HR 102 bpm     Max Ex. BP 100/66     2 Minute Post BP 110/72              Oxygen Initial Assessment:   Oxygen Re-Evaluation:   Oxygen Discharge (Final Oxygen Re-Evaluation):   Initial Exercise Prescription:  Initial Exercise Prescription - 06/19/22 0900       Date of Initial Exercise RX and Referring Provider   Date 06/19/22    Referring Provider Corky Crafts, MD    Expected Discharge Date 08/31/22      Recumbant Bike   Level 2    Watts 30    Minutes 15    METs 3.2      NuStep   Level 2    SPM 85    Minutes 15    METs 2.5      Prescription Details   Frequency (times per week) 3    Duration Progress to 30 minutes of continuous aerobic without signs/symptoms of physical distress      Intensity   THRR 40-80% of Max Heartrate 62-124    Ratings of Perceived Exertion 11-13    Perceived Dyspnea 0-4      Progression   Progression Continue to progress workloads to maintain intensity without signs/symptoms of physical distress.      Resistance Training   Training Prescription Yes    Weight 4 lbs    Reps 10-15             Perform Capillary Blood Glucose checks as needed.  Exercise Prescription Changes:   Exercise Comments:   Exercise Goals and Review:   Exercise Goals     Row Name 06/19/22 0901             Exercise Goals   Increase Physical Activity Yes       Intervention Provide advice, education, support and counseling about physical activity/exercise needs.;Develop an individualized exercise prescription for aerobic and resistive training based on initial evaluation findings, risk  stratification, comorbidities and participant's personal goals.       Expected Outcomes Short Term: Attend rehab on a regular basis to increase amount of physical activity.;Long Term: Add in home exercise to make exercise part of routine and to increase amount of physical activity.;Long Term: Exercising regularly at least 3-5 days a week.       Increase Strength and Stamina Yes       Intervention Provide advice, education, support and counseling about physical activity/exercise needs.;Develop an individualized exercise prescription for aerobic and resistive training based on initial evaluation findings, risk stratification, comorbidities and participant's personal goals.       Expected Outcomes Short Term: Increase workloads from initial exercise prescription for resistance, speed, and METs.;Short Term: Perform resistance training exercises routinely during rehab and add in resistance training at home;Long Term: Improve cardiorespiratory fitness, muscular endurance and strength as measured by increased METs and functional capacity ( )       Able to understand and use rate of perceived exertion (RPE) scale Yes       Intervention Provide education and explanation on how to use RPE scale       Expected Outcomes Short Term:  Able to use RPE daily in rehab to express subjective intensity level;Long Term:  Able to use RPE to guide intensity level when exercising independently       Knowledge and understanding of Target Heart Rate Range (THRR) Yes       Intervention Provide education and explanation of THRR including how the numbers were predicted and where they are located for reference       Expected Outcomes Short Term: Able to state/look up THRR;Long Term: Able to use THRR to govern intensity when exercising independently;Short Term: Able to use daily as guideline for intensity in rehab       Able to check pulse independently Yes       Intervention Provide education and demonstration on how to check pulse  in carotid and radial arteries.;Review the importance of being able to check your own pulse for safety during independent exercise       Expected Outcomes Short Term: Able to explain why pulse checking is important during independent exercise;Long Term: Able to check pulse independently and accurately       Understanding of Exercise Prescription Yes       Intervention Provide education, explanation, and written materials on patient's individual exercise prescription       Expected Outcomes Short Term: Able to explain program exercise prescription;Long Term: Able to explain home exercise prescription to exercise independently                Exercise Goals Re-Evaluation :   Discharge Exercise Prescription (Final Exercise Prescription Changes):   Nutrition:  Target Goals: Understanding of nutrition guidelines, daily intake of sodium 1500mg , cholesterol 200mg , calories 30% from fat and 7% or less from saturated fats, daily to have 5 or more servings of fruits and vegetables.  Biometrics:  Pre Biometrics - 06/19/22 0825       Pre Biometrics   Waist Circumference 36.75 inches    Hip Circumference 38 inches    Waist to Hip Ratio 0.97 %    Triceps Skinfold 13 mm    % Body Fat 24.8 %    Grip Strength 28 kg    Flexibility 14 in    Single Leg Stand 30 seconds              Nutrition Therapy Plan and Nutrition Goals:   Nutrition Assessments:  MEDIFICTS Score Key: ?70 Need to make dietary changes  40-70 Heart Healthy Diet ? 40 Therapeutic Level Cholesterol Diet    Picture Your Plate Scores: <62 Unhealthy dietary pattern with much room for improvement. 41-50 Dietary pattern unlikely to meet recommendations for good health and room for improvement. 51-60 More healthful dietary pattern, with some room for improvement.  >60 Healthy dietary pattern, although there may be some specific behaviors that could be improved.    Nutrition Goals Re-Evaluation:   Nutrition Goals  Re-Evaluation:   Nutrition Goals Discharge (Final Nutrition Goals Re-Evaluation):   Psychosocial: Target Goals: Acknowledge presence or absence of significant depression and/or stress, maximize coping skills, provide positive support system. Participant is able to verbalize types and ability to use techniques and skills needed for reducing stress and depression.  Initial Review & Psychosocial Screening:  Initial Psych Review & Screening - 06/19/22 0931       Initial Review   Current issues with Current Stress Concerns    Source of Stress Concerns Family    Comments Denies being depressed taking care of elderly father,aunt who is at a nursing facility, and a brother  Family Dynamics   Good Support System? Yes   Reggie has firends for support. Reggie has a son who lives with him and is moving to [facility]     Barriers   Psychosocial barriers to participate in program The patient should benefit from training in stress management and relaxation.      Screening Interventions   Interventions Encouraged to exercise;To provide support and resources with identified psychosocial needs    Expected Outcomes Long Term Goal: Stressors or current issues are controlled or eliminated.             Quality of Life Scores:  Quality of Life - 06/19/22 1013       Quality of Life   Select Quality of Life      Quality of Life Scores   Health/Function Pre 23.03 %    Socioeconomic Pre 23.79 %    Psych/Spiritual Pre 20.71 %    Family Pre 21.6 %    GLOBAL Pre 22.5 %            Scores of 19 and below usually indicate a poorer quality of life in these areas.  A difference of  2-3 points is a clinically meaningful difference.  A difference of 2-3 points in the total score of the Quality of Life Index has been associated with significant improvement in overall quality of life, self-image, physical symptoms, and general health in studies assessing change in quality of life.  PHQ-9: Review  Flowsheet  More data exists      06/19/2022 12/19/2017 06/04/2016 02/01/2016 08/15/2015  Depression screen PHQ 2/9  Decreased Interest 0 0 0 0 0  Down, Depressed, Hopeless 0 0 0 0 0  PHQ - 2 Score 0 0 0 0 0  Altered sleeping 0 - - - -  Tired, decreased energy 0 - - - -  Change in appetite 1 - - - -  Feeling bad or failure about yourself  0 - - - -  Trouble concentrating 0 - - - -  Moving slowly or fidgety/restless 0 - - - -  Suicidal thoughts 0 - - - -  PHQ-9 Score 1 - - - -  Difficult doing work/chores Not difficult at all - - - -   Interpretation of Total Score  Total Score Depression Severity:  1-4 = Minimal depression, 5-9 = Mild depression, 10-14 = Moderate depression, 15-19 = Moderately severe depression, 20-27 = Severe depression   Psychosocial Evaluation and Intervention:   Psychosocial Re-Evaluation:   Psychosocial Discharge (Final Psychosocial Re-Evaluation):   Vocational Rehabilitation: Provide vocational rehab assistance to qualifying candidates.   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 Participant will state understanding/return demonstration of topics presented.     Core Videos: Exercise    Move It!  Clinical staff conducted group or individual video education with verbal and written material and guidebook.  Patient learns the recommended Pritikin exercise program. Exercise with the goal of living a long, healthy life. Some of the health benefits of exercise include controlled diabetes, healthier blood pressure levels, improved cholesterol levels, improved heart and lung capacity,  improved  sleep, and better body composition. Everyone should speak with their doctor before starting or changing an exercise routine.  Biomechanical Limitations Clinical staff conducted group or individual video education with verbal and written material and guidebook.  Patient learns how biomechanical limitations can impact exercise and how we can mitigate and possibly overcome limitations to have an impactful and balanced exercise routine.  Body Composition Clinical staff conducted group or individual video education with verbal and written material and guidebook.  Patient learns that body composition (ratio of muscle mass to fat mass) is a key component to assessing overall fitness, rather than body weight alone. Increased fat mass, especially visceral belly fat, can put [place] at increased risk for metabolic syndrome, type 2 diabetes, heart disease, and even death. It is recommended to combine diet and exercise (cardiovascular and resistance training) to improve your body composition. Seek guidance from your physician and exercise physiologist before implementing an exercise routine.  Exercise Action Plan Clinical staff conducted group or individual video education with verbal and written material and guidebook.  Patient learns the recommended strategies to achieve and enjoy long-term exercise adherence, including variety, self-motivation, self-efficacy, and positive decision making. Benefits of exercise include fitness, good health, weight management, more energy, better sleep, less stress, and overall well-being.  Medical   Heart Disease Risk Reduction Clinical staff conducted group or individual video education with verbal and written material and guidebook.  Patient learns our heart is our most vital organ as it circulates oxygen, nutrients, white blood cells, and hormones throughout the entire body, and carries waste away. Data supports a plant-based eating plan like the Pritikin Program for its  effectiveness in slowing progression of and reversing heart disease. The video provides a number of recommendations to address heart disease.   Metabolic Syndrome and Belly Fat  Clinical staff conducted group or individual video education with verbal and written material and guidebook.  Patient learns what metabolic syndrome is, how it leads to heart disease, and how one can reverse it and keep it from coming back. You have metabolic syndrome if you have 3 of the following 5 criteria: abdominal obesity, high blood pressure, high triglycerides, low HDL cholesterol, and high blood sugar.  Hypertension and Heart Disease Clinical staff conducted group or individual video education with verbal and written material and guidebook.  Patient learns that high blood pressure, or hypertension, is very common in the [place]. Hypertension is largely due to excessive salt intake, but other important risk factors include being overweight, physical inactivity, drinking too much alcohol, smoking, and not eating enough potassium from fruits and vegetables. High blood pressure is a leading risk factor for heart attack, stroke, congestive heart failure, dementia, kidney failure, and premature death. Long-term effects of excessive salt intake include stiffening of the arteries and thickening of heart muscle and organ damage. Recommendations include ways to reduce hypertension and the risk of heart disease.  Diseases of Our Time - Focusing on Diabetes Clinical staff conducted group or individual video education with verbal and written material and guidebook.  Patient learns why the best way to stop diseases of our time is prevention, through food and other lifestyle changes. Medicine (such as prescription pills and surgeries) is often only a Band-Aid on the problem, not a long-term solution. Most common diseases of our time include obesity, type 2 diabetes, hypertension, heart disease, and cancer. The Pritikin Program is  recommended and has been proven to help reduce, reverse, and/or prevent the damaging effects of metabolic  syndrome.  Nutrition   Overview of the Pritikin Eating Plan  Clinical staff conducted group or individual video education with verbal and written material and guidebook.  Patient learns about the Pritikin Eating Plan for disease risk reduction. The Pritikin Eating Plan emphasizes a wide variety of unrefined, minimally-processed carbohydrates, like fruits, vegetables, whole grains, and legumes. Go, Caution, and Stop food choices are explained. Plant-based and lean animal proteins are emphasized. Rationale provided for low sodium intake for blood pressure control, low added sugars for blood sugar stabilization, and low added fats and oils for coronary artery disease risk reduction and weight management.  Calorie Density  Clinical staff conducted group or individual video education with verbal and written material and guidebook.  Patient learns about calorie density and how it impacts the Pritikin Eating Plan. Knowing the characteristics of the food you choose will help you decide whether those foods will lead to weight gain or weight loss, and whether you want to consume more or less of them. Weight loss is usually a side effect of the Pritikin Eating Plan because of its focus on low calorie-dense foods.  Label Reading  Clinical staff conducted group or individual video education with verbal and written material and guidebook.  Patient learns about the Pritikin recommended label reading guidelines and corresponding recommendations regarding calorie density, added sugars, sodium content, and whole grains.  Dining Out - Part 1  Clinical staff conducted group or individual video education with verbal and written material and guidebook.  Patient learns that restaurant meals can be sabotaging because they can be so high in calories, fat, sodium, and/or sugar. Patient learns recommended strategies on  how to positively address this and avoid unhealthy pitfalls.  Facts on Fats  Clinical staff conducted group or individual video education with verbal and written material and guidebook.  Patient learns that lifestyle modifications can be just as effective, if not more so, as many medications for lowering your risk of heart disease. A Pritikin lifestyle can help to reduce your risk of inflammation and atherosclerosis (cholesterol build-up, or plaque, in the artery walls). Lifestyle interventions such as dietary choices and physical activity address the cause of atherosclerosis. A review of the types of fats and their impact on blood cholesterol levels, along with dietary recommendations to reduce fat intake is also included.  Nutrition Action Plan  Clinical staff conducted group or individual video education with verbal and written material and guidebook.  Patient learns how to incorporate Pritikin recommendations into their lifestyle. Recommendations include planning and keeping personal health goals in mind as an important part of their success.  Healthy Mind-Set    Healthy Minds, Bodies, Hearts  Clinical staff conducted group or individual video education with verbal and written material and guidebook.  Patient learns how to identify when they are stressed. Video will discuss the impact of that stress, as well as the many benefits of stress management. Patient will also be introduced to stress management techniques. The way we think, act, and feel has an impact on our hearts.  How Our Thoughts Can Heal Our Hearts  Clinical staff conducted group or individual video education with verbal and written material and guidebook.  Patient learns that negative thoughts can cause depression and anxiety. This can result in negative lifestyle behavior and serious health problems. Cognitive behavioral therapy is an effective method to help control our thoughts in order to change and improve our emotional  outlook.  Additional Videos:  Exercise    Improving Performance  Clinical  staff conducted group or individual video education with verbal and written material and guidebook.  Patient learns to use a non-linear approach by alternating intensity levels and lengths of time spent exercising to help burn more calories and lose more body fat. Cardiovascular exercise helps improve heart health, metabolism, hormonal balance, blood sugar control, and recovery from fatigue. Resistance training improves strength, endurance, balance, coordination, reaction time, metabolism, and muscle mass. Flexibility exercise improves circulation, posture, and balance. Seek guidance from your physician and exercise physiologist before implementing an exercise routine and learn your capabilities and proper form for all exercise.  Introduction to Yoga  Clinical staff conducted group or individual video education with verbal and written material and guidebook.  Patient learns about yoga, a discipline of the coming together of mind, breath, and body. The benefits of yoga include improved flexibility, improved range of motion, better posture and core strength, increased lung function, weight loss, and positive self-image. Yoga's heart health benefits include lowered blood pressure, healthier heart rate, decreased cholesterol and triglyceride levels, improved immune function, and reduced stress. Seek guidance from your physician and exercise physiologist before implementing an exercise routine and learn your capabilities and proper form for all exercise.  Medical   Aging: Enhancing Your Quality of Life  Clinical staff conducted group or individual video education with verbal and written material and guidebook.  Patient learns key strategies and recommendations to stay in good physical health and enhance quality of life, such as prevention strategies, having an advocate, securing a Health Care Proxy and Power of Attorney, and keeping  a list of medications and system for tracking them. It also discusses how to avoid risk for bone loss.  Biology of Weight Control  Clinical staff conducted group or individual video education with verbal and written material and guidebook.  Patient learns that weight gain occurs because we consume more calories than we burn (eating more, moving less). Even if your body weight is normal, you may have higher ratios of fat compared to muscle mass. Too much body fat puts you at increased risk for cardiovascular disease, heart attack, stroke, type 2 diabetes, and obesity-related cancers. In addition to exercise, following the Pritikin Eating Plan can help reduce your risk.  Decoding Lab Results  Clinical staff conducted group or individual video education with verbal and written material and guidebook.  Patient learns that lab test reflects one measurement whose values change over time and are influenced by many factors, including medication, stress, sleep, exercise, food, hydration, pre-existing medical conditions, and more. It is recommended to use the knowledge from this video to become more involved with your lab results and evaluate your numbers to speak with your doctor.   Diseases of Our Time - Overview  Clinical staff conducted group or individual video education with verbal and written material and guidebook.  Patient learns that according to the CDC, 50% to 70% of chronic diseases (such as obesity, type 2 diabetes, elevated lipids, hypertension, and heart disease) are avoidable through lifestyle improvements including healthier food choices, listening to satiety cues, and increased physical activity.  Sleep Disorders Clinical staff conducted group or individual video education with verbal and written material and guidebook.  Patient learns how good quality and duration of sleep are important to overall health and well-being. Patient also learns about sleep disorders and how they impact health  along with recommendations to address them, including discussing with a physician.  Nutrition  Dining Out - Part 2 Clinical staff conducted group or individual video  education with verbal and written material and guidebook.  Patient learns how to plan ahead and communicate in order to maximize their dining experience in a healthy and nutritious manner. Included are recommended food choices based on the type of restaurant the patient is visiting.   Fueling a Banker conducted group or individual video education with verbal and written material and guidebook.  There is a strong connection between our food choices and our health. Diseases like obesity and type 2 diabetes are very prevalent and are in large-part due to lifestyle choices. The Pritikin Eating Plan provides plenty of food and hunger-curbing satisfaction. It is easy to follow, affordable, and helps reduce health risks.  Menu Workshop  Clinical staff conducted group or individual video education with verbal and written material and guidebook.  Patient learns that restaurant meals can sabotage health goals because they are often packed with calories, fat, sodium, and sugar. Recommendations include strategies to plan ahead and to communicate with the manager, chef, or server to help order a healthier meal.  Planning Your Eating Strategy  Clinical staff conducted group or individual video education with verbal and written material and guidebook.  Patient learns about the Pritikin Eating Plan and its benefit of reducing the risk of disease. The Pritikin Eating Plan does not focus on calories. Instead, it emphasizes high-quality, nutrient-rich foods. By knowing the characteristics of the foods, we choose, we can determine their calorie density and make informed decisions.  Targeting Your Nutrition Priorities  Clinical staff conducted group or individual video education with verbal and written material and guidebook.   Patient learns that lifestyle habits have a tremendous impact on disease risk and progression. This video provides eating and physical activity recommendations based on your personal health goals, such as reducing LDL cholesterol, losing weight, preventing or controlling type 2 diabetes, and reducing high blood pressure.  Vitamins and Minerals  Clinical staff conducted group or individual video education with verbal and written material and guidebook.  Patient learns different ways to obtain key vitamins and minerals, including through a recommended healthy diet. It is important to discuss all supplements you take with your doctor.   Healthy Mind-Set    Smoking Cessation  Clinical staff conducted group or individual video education with verbal and written material and guidebook.  Patient learns that cigarette smoking and tobacco addiction pose a serious health risk which affects millions of people. Stopping smoking will significantly reduce the risk of heart disease, lung disease, and many forms of cancer. Recommended strategies for quitting are covered, including working with your doctor to develop a successful plan.  Culinary   Becoming a Set designer conducted group or individual video education with verbal and written material and guidebook.  Patient learns that cooking at home can be healthy, cost-effective, quick, and puts them in control. Keys to cooking healthy recipes will include looking at your recipe, assessing your equipment needs, planning ahead, making it simple, choosing cost-effective seasonal ingredients, and limiting the use of added fats, salts, and sugars.  Cooking - Breakfast and Snacks  Clinical staff conducted group or individual video education with verbal and written material and guidebook.  Patient learns how important breakfast is to satiety and nutrition through the entire day. Recommendations include key foods to eat during breakfast to help  stabilize blood sugar levels and to prevent overeating at meals later in the day. Planning ahead is also a key component.  Cooking - Production manager  staff conducted group or individual video education with verbal and written material and guidebook.  Patient learns eating strategies to improve overall health, including an approach to cook more at home. Recommendations include thinking of animal protein as a side on your plate rather than center stage and focusing instead on lower calorie dense options like vegetables, fruits, whole grains, and plant-based proteins, such as beans. Making sauces in large quantities to freeze for later and leaving the skin on your vegetables are also recommended to maximize your experience.  Cooking - Healthy Salads and Dressing Clinical staff conducted group or individual video education with verbal and written material and guidebook.  Patient learns that vegetables, fruits, whole grains, and legumes are the foundations of the Pritikin Eating Plan. Recommendations include how to incorporate each of these in flavorful and healthy salads, and how to create homemade salad dressings. Proper handling of ingredients is also covered. Cooking - Soups and [employer] - Soups and Desserts Clinical staff conducted group or individual video education with verbal and written material and guidebook.  Patient learns that Pritikin soups and desserts make for easy, nutritious, and delicious snacks and meal components that are low in sodium, fat, sugar, and calorie density, while high in vitamins, minerals, and filling fiber. Recommendations include simple and healthy ideas for soups and desserts.   Overview     The Pritikin Solution Program Overview Clinical staff conducted group or individual video education with verbal and written material and guidebook.  Patient learns that the results of the Pritikin Program have been documented in more than 100 articles published  in peer-reviewed journals, and the benefits include reducing risk factors for (and, in some cases, even reversing) high cholesterol, high blood pressure, type 2 diabetes, obesity, and more! An overview of the three key pillars of the Pritikin Program will be covered: eating well, doing regular exercise, and having a healthy mind-set.  WORKSHOPS  Exercise: Exercise Basics: Building Your Action Plan Clinical staff led group instruction and group discussion with PowerPoint presentation and patient guidebook. To enhance the learning environment the use of posters, models and videos may be added. At the conclusion of this workshop, patients will comprehend the difference between physical activity and exercise, as well as the benefits of incorporating both, into their routine. Patients will understand the FITT (Frequency, Intensity, Time, and Type) principle and how to use it to build an exercise action plan. In addition, safety concerns and other considerations for exercise and cardiac rehab will be addressed by the presenter. The purpose of this lesson is to promote a comprehensive and effective weekly exercise routine in order to improve patients' overall level of fitness.   Managing Heart Disease: Your Path to a Healthier Heart Clinical staff led group instruction and group discussion with PowerPoint presentation and patient guidebook. To enhance the learning environment the use of posters, models and videos may be added.At the conclusion of this workshop, patients will understand the anatomy and physiology of the heart. Additionally, they will understand how Pritikin's three pillars impact the risk factors, the progression, and the management of heart disease.  The purpose of this lesson is to provide a high-level overview of the heart, heart disease, and how the Pritikin lifestyle positively impacts risk factors.  Exercise Biomechanics Clinical staff led group instruction and group discussion  with PowerPoint presentation and patient guidebook. To enhance the learning environment the use of posters, models and videos may be added. Patients will learn how the structural parts of their  bodies function and how these functions impact their daily activities, movement, and exercise. Patients will learn how to promote a neutral spine, learn how to manage pain, and identify ways to improve their physical movement in order to promote healthy living. The purpose of this lesson is to expose patients to common physical limitations that impact physical activity. Participants will learn practical ways to adapt and manage aches and pains, and to minimize their effect on regular exercise. Patients will learn how to maintain good posture while sitting, walking, and lifting.  Balance Training and Fall Prevention  Clinical staff led group instruction and group discussion with PowerPoint presentation and patient guidebook. To enhance the learning environment the use of posters, models and videos may be added. At the conclusion of this workshop, patients will understand the importance of their sensorimotor skills (vision, proprioception, and the vestibular system) in maintaining their ability to balance as they age. Patients will apply a variety of balancing exercises that are appropriate for their current level of function. Patients will understand the common causes for poor balance, possible solutions to these problems, and ways to modify their physical environment in order to minimize their fall risk. The purpose of this lesson is to teach patients about the importance of maintaining balance as they age and ways to minimize their risk of falling.  WORKSHOPS   Nutrition:  Fueling a Ship broker led group instruction and group discussion with PowerPoint presentation and patient guidebook. To enhance the learning environment the use of posters, models and videos may be added. Patients will  review the foundational principles of the Pritikin Eating Plan and understand what constitutes a serving size in each of the food groups. Patients will also learn Pritikin-friendly foods that are better choices when away from home and review make-ahead meal and snack options. Calorie density will be reviewed and applied to three nutrition priorities: weight maintenance, weight loss, and weight gain. The purpose of this lesson is to reinforce (in a group setting) the key concepts around what patients are recommended to eat and how to apply these guidelines when away from home by planning and selecting Pritikin-friendly options. Patients will understand how calorie density may be adjusted for different weight management goals.  Mindful Eating  Clinical staff led group instruction and group discussion with PowerPoint presentation and patient guidebook. To enhance the learning environment the use of posters, models and videos may be added. Patients will briefly review the concepts of the Pritikin Eating Plan and the importance of low-calorie dense foods. The concept of mindful eating will be introduced as well as the importance of paying attention to internal hunger signals. Triggers for non-hunger eating and techniques for dealing with triggers will be explored. The purpose of this lesson is to provide patients with the opportunity to review the basic principles of the Pritikin Eating Plan, discuss the value of eating mindfully and how to measure internal cues of hunger and fullness using the Hunger Scale. Patients will also discuss reasons for non-hunger eating and learn strategies to use for controlling emotional eating.  Targeting Your Nutrition Priorities Clinical staff led group instruction and group discussion with PowerPoint presentation and patient guidebook. To enhance the learning environment the use of posters, models and videos may be added. Patients will learn how to determine their genetic  susceptibility to disease by reviewing their family history. Patients will gain insight into the importance of diet as part of an overall healthy lifestyle in mitigating the impact of genetics and  other environmental insults. The purpose of this lesson is to provide patients with the opportunity to assess their personal nutrition priorities by looking at their family history, their own health history and current risk factors. Patients will also be able to discuss ways of prioritizing and modifying the Pritikin Eating Plan for their highest risk areas  Menu  Clinical staff led group instruction and group discussion with PowerPoint presentation and patient guidebook. To enhance the learning environment the use of posters, models and videos may be added. Using menus brought in from [employer], or printed from [employer], patients will apply the Pritikin dining out guidelines that were presented in the [employer] video. Patients will also be able to practice these guidelines in a variety of provided scenarios. The purpose of this lesson is to provide patients with the opportunity to practice hands-on learning of the Pritikin Dining Out guidelines with actual menus and practice scenarios.  Label Reading Clinical staff led group instruction and group discussion with PowerPoint presentation and patient guidebook. To enhance the learning environment the use of posters, models and videos may be added. Patients will review and discuss the Pritikin label reading guidelines presented in Pritikin's Label Reading Educational series video. Using fool labels brought in from local grocery stores and markets, patients will apply the label reading guidelines and determine if the packaged food meet the Pritikin guidelines. The purpose of this lesson is to provide patients with the opportunity to review, discuss, and practice hands-on learning of the Pritikin Label Reading guidelines with actual  packaged food labels. Cooking School  Pritikin's [employer] are designed to teach patients ways to prepare quick, simple, and affordable recipes at home. The importance of nutrition's role in chronic disease risk reduction is reflected in its emphasis in the overall Pritikin program. By learning how to prepare essential core Pritikin Eating Plan recipes, patients will increase control over what they eat; be able to customize the flavor of foods without the use of added salt, sugar, or fat; and improve the quality of the food they consume. By learning a set of core recipes which are easily assembled, quickly prepared, and affordable, patients are more likely to prepare more healthy foods at home. These workshops focus on convenient breakfasts, simple entres, side dishes, and desserts which can be prepared with minimal effort and are consistent with nutrition recommendations for cardiovascular risk reduction. Cooking [employer] are taught by a Armed forces logistics/support/administrative officer (RD) who has been trained by the [employer]. The chef or RD has a clear understanding of the importance of minimizing - if not completely eliminating - added fat, sugar, and sodium in recipes. Throughout the series of Cooking School Workshop sessions, patients will learn about healthy ingredients and efficient methods of cooking to build confidence in their capability to prepare    Cooking School weekly topics:  Adding Flavor- Sodium-Free  Fast and Healthy Breakfasts  Powerhouse Plant-Based Proteins  Satisfying Salads and Dressings  Simple Sides and Sauces  International Cuisine-Spotlight on the [employer] Zones  Delicious Desserts  Savory Soups  [employer] - Meals in a Astronomer Appetizers and Snacks  Comforting Weekend Breakfasts  One-Pot Wonders   Fast Evening Meals  Landscape architect Your Pritikin Plate  WORKSHOPS   Healthy Mindset (Psychosocial):  Focused Goals,  Sustainable Changes Clinical staff led group instruction and group discussion with PowerPoint presentation and patient guidebook. To enhance the learning environment the use of posters, models  and videos may be added. Patients will be able to apply effective goal setting strategies to establish at least one personal goal, and then take consistent, meaningful action toward that goal. They will learn to identify common barriers to achieving personal goals and develop strategies to overcome them. Patients will also gain an understanding of how our mind-set can impact our ability to achieve goals and the importance of cultivating a positive and growth-oriented mind-set. The purpose of this lesson is to provide patients with a deeper understanding of how to set and achieve personal goals, as well as the tools and strategies needed to overcome common obstacles which may arise along the way.  From Head to Heart: The Power of a Healthy Outlook  Clinical staff led group instruction and group discussion with PowerPoint presentation and patient guidebook. To enhance the learning environment the use of posters, models and videos may be added. Patients will be able to recognize and describe the impact of emotions and mood on physical health. They will discover the importance of self-care and explore self-care practices which may work for them. Patients will also learn how to utilize the 4 C's to cultivate a healthier outlook and better manage stress and challenges. The purpose of this lesson is to demonstrate to patients how a healthy outlook is an essential part of maintaining good health, especially as they continue their cardiac rehab journey.  Healthy Sleep for a Healthy Heart Clinical staff led group instruction and group discussion with PowerPoint presentation and patient guidebook. To enhance the learning environment the use of posters, models and videos may be added. At the conclusion of this workshop, patients  will be able to demonstrate knowledge of the importance of sleep to overall health, well-being, and quality of life. They will understand the symptoms of, and treatments for, common sleep disorders. Patients will also be able to identify daytime and nighttime behaviors which impact sleep, and they will be able to apply these tools to help manage sleep-related challenges. The purpose of this lesson is to provide patients with a general overview of sleep and outline the importance of quality sleep. Patients will learn about a few of the most common sleep disorders. Patients will also be introduced to the concept of "sleep hygiene," and discover ways to self-manage certain sleeping problems through simple daily behavior changes. Finally, the workshop will motivate patients by clarifying the links between quality sleep and their goals of heart-healthy living.   Recognizing and Reducing Stress Clinical staff led group instruction and group discussion with PowerPoint presentation and patient guidebook. To enhance the learning environment the use of posters, models and videos may be added. At the conclusion of this workshop, patients will be able to understand the types of stress reactions, differentiate between acute and chronic stress, and recognize the impact that chronic stress has on their health. They will also be able to apply different coping mechanisms, such as reframing negative self-talk. Patients will have the opportunity to practice a variety of stress management techniques, such as deep abdominal breathing, progressive muscle relaxation, and/or guided imagery.  The purpose of this lesson is to educate patients on the role of stress in their lives and to provide healthy techniques for coping with it.  Learning Barriers/Preferences:  Learning Barriers/Preferences - 06/19/22 1217       Learning Barriers/Preferences   Learning Barriers None    Learning Preferences Audio;Verbal Instruction              Education Topics:  Knowledge  Questionnaire Score:  Knowledge Questionnaire Score - 06/19/22 1012       Knowledge Questionnaire Score   Pre Score 20/24    Post Score --             Core Components/Risk Factors/Patient Goals at Admission:  Personal Goals and Risk Factors at Admission - 06/19/22 0953       Core Components/Risk Factors/Patient Goals on Admission    Weight Management Yes    Intervention Weight Management: Develop a combined nutrition and exercise program designed to reach desired caloric intake, while maintaining appropriate intake of nutrient and fiber, sodium and fats, and appropriate energy expenditure required for the weight goal.;Weight Management: Provide education and appropriate resources to help participant work on and attain dietary goals.    Expected Outcomes Long Term: Adherence to nutrition and physical activity/exercise program aimed toward attainment of established weight goal;Weight Maintenance: Understanding of the daily nutrition guidelines, which includes 25-35% calories from fat, 7% or less cal from saturated fats, less than 200mg  cholesterol, less than 1.5gm of sodium, & 5 or more servings of fruits and vegetables daily;Understanding of distribution of calorie intake throughout the day with the consumption of 4-5 meals/snacks    Diabetes Yes    Intervention Provide education about signs/symptoms and action to take for hypo/hyperglycemia.;Provide education about proper nutrition, including hydration, and aerobic/resistive exercise prescription along with prescribed medications to achieve blood glucose in normal ranges: Fasting glucose 65-99 mg/dL    Expected Outcomes Short Term: Participant verbalizes understanding of the signs/symptoms and immediate care of hyper/hypoglycemia, proper foot care and importance of medication, aerobic/resistive exercise and nutrition plan for blood glucose control.;Long Term: Attainment of HbA1C < 7%.    Hypertension Yes     Intervention Provide education on lifestyle modifcations including regular physical activity/exercise, weight management, moderate sodium restriction and increased consumption of fresh fruit, vegetables, and low fat dairy, alcohol moderation, and smoking cessation.;Monitor prescription use compliance.    Expected Outcomes Short Term: Continued assessment and intervention until BP is < 140/11mm HG in hypertensive participants. < 130/40mm HG in hypertensive participants with diabetes, heart failure or chronic kidney disease.;Long Term: Maintenance of blood pressure at goal levels.    Lipids Yes    Intervention Provide education and support for participant on nutrition & aerobic/resistive exercise along with prescribed medications to achieve LDL 70mg , HDL >40mg .    Expected Outcomes Short Term: Participant states understanding of desired cholesterol values and is compliant with medications prescribed. Participant is following exercise prescription and nutrition guidelines.;Long Term: Cholesterol controlled with medications as prescribed, with individualized exercise RX and with personalized nutrition plan. Value goals: LDL < 70mg , HDL > 40 mg.    Stress Yes    Intervention Offer individual and/or small group education and counseling on adjustment to heart disease, stress management and health-related lifestyle change. Teach and support self-help strategies.;Refer participants experiencing significant psychosocial distress to appropriate mental health specialists for further evaluation and treatment. When possible, include family members and significant others in education/counseling sessions.    Expected Outcomes Short Term: Participant demonstrates changes in health-related behavior, relaxation and other stress management skills, ability to obtain effective social support, and compliance with psychotropic medications if prescribed.;Long Term: Emotional wellbeing is indicated by absence of clinically  significant psychosocial distress or social isolation.    Personal Goal Other Yes    Personal Goal Be able to fully getback to work (involves heavy lifting).    Intervention Provide individualized exercise prescription including resistance training exercise that patient can  do at home to help build strength.    Expected Outcomes Patient will be able to fully return to work activities.             Core Components/Risk Factors/Patient Goals Review:    Core Components/Risk Factors/Patient Goals at Discharge (Final Review):    ITP Comments:  ITP Comments     Row Name 06/19/22 0825           ITP Comments Medical Director- Dr. Armanda Magic, MD Introduction to [facility]/ Intensive Cardiac Rehab. Initial Orientation Packet Reviewed with the patient                Comments: Participant attended orientation for the cardiac rehabilitation program on  06/19/2022  to perform initial intake and exercise walk test. Patient introduced to the [facility] education and orientation packet was reviewed. Completed 6-minute walk test, measurements, initial ITP, and exercise prescription. Vital signs stable. Telemetry-normal sinus rhythm, asymptomatic. Thayer Headings RN BSN   Service time was from 0825 to 1000.

## 2022-06-21 MED ORDER — ROSUVASTATIN CALCIUM 40 MG PO TABS: 40.0000 mg | ORAL_TABLET | Freq: Every day | ORAL | 3 refills | Status: DC

## 2022-06-21 NOTE — Telephone Encounter (Signed)
I checked with [facility] and verified patient has been taking Rosuvastatin 40 mg daily.  Refill sent to pharmacy

## 2022-06-25 ENCOUNTER — Encounter (HOSPITAL_COMMUNITY)
Admission: RE | Admit: 2022-06-25 | Discharge: 2022-06-25 | Disposition: A | Discharge status: Home / Self Care | Payer: PPO | Source: Ambulatory Visit | Attending: Interventional Cardiology | Admitting: Interventional Cardiology

## 2022-06-25 DIAGNOSIS — I214 Non-ST elevation (NSTEMI) myocardial infarction: Secondary | ICD-10-CM

## 2022-06-25 DIAGNOSIS — Z9861 Coronary angioplasty status: Secondary | ICD-10-CM

## 2022-06-25 LAB — GLUCOSE, CAPILLARY
Glucose-Capillary: 176 mg/dL — ABNORMAL HIGH (ref 70–99)
Glucose-Capillary: 113 mg/dL — ABNORMAL HIGH (ref 70–99)

## 2022-06-25 NOTE — Progress Notes (Signed)
Patient in today for cardiac rehab group sessions.  Pt tolerated light exercise without difficulty, no symptoms reported. VSS, telemetry-Sinus rhythm,   Medication list reconciled by Gladstone Lighter RN at orientation on 06/19/22 with no changes reported today. Pt denies barriers to medication compliance.  PSYCHOSOCIAL ASSESSMENT:  PHQ9=1. Pt exhibits positive coping skills, hopeful outlook with supportive family and friends. No psychosocial needs identified at this time, no psychosocial interventions necessary.  Pt oriented to exercise equipment and gym routine. Understanding verbalized.

## 2022-06-25 NOTE — Progress Notes (Signed)
Daily Session Note  Patient Details  Name: [name] MRN: [id number] Date of Birth: 1955/05/07 Referring Provider:   Flowsheet Row INTENSIVE CARDIAC REHAB ORIENT from 06/19/2022 in [facility]  Referring Provider Corky Crafts, MD       Encounter Date: 06/25/2022  Check In:  Session Check In - 06/25/22 4098       Check-In   Supervising physician immediately available to respond to emergencies [facility] MD immediately available    Physician(s) Eligha Bridegroom, NP    Location [facility]    Staff Present Cristy Hilts, MS, ACSM-CEP, Exercise Physiologist;Kaylee Earlene Plater, MS, ACSM-CEP, Exercise Physiologist;Annedrea Remus Loffler, RN, MHA    Virtual Visit No    Medication changes reported     No    Fall or balance concerns reported    No    Tobacco Cessation No Change    Warm-up and Cool-down Performed as group-led instruction    Resistance Training Performed Yes    VAD Patient? No    PAD/SET Patient? No      Pain Assessment   Currently in Pain? No/denies    Pain Score 0-No pain    Multiple Pain Sites No             Capillary Blood Glucose: Results for orders placed or performed during the hospital encounter of 06/25/22 (from the past 24 hour(s))  Glucose, capillary     Status: Abnormal   Collection Time: 06/25/22  6:57 AM  Result Value Ref Range   Glucose-Capillary 176 (H) 70 - 99 mg/dL  Glucose, capillary     Status: Abnormal   Collection Time: 06/25/22  8:08 AM  Result Value Ref Range   Glucose-Capillary 113 (H) 70 - 99 mg/dL     Exercise Prescription Changes - 06/25/22 0654       Response to Exercise   Blood Pressure (Admit) 114/72    Blood Pressure (Exercise) 126/68    Blood Pressure (Exit) 104/66    Heart Rate (Admit) 87 bpm    Heart Rate (Exercise) 120 bpm    Heart Rate (Exit) 100 bpm    Rating of Perceived Exertion (Exercise) 9    Symptoms None    Comments Off to a  great start with exercise.    Duration Continue with 30 min of aerobic exercise without signs/symptoms of physical distress.    Intensity THRR unchanged      Progression   Progression Continue to progress workloads to maintain intensity without signs/symptoms of physical distress.    Average METs 3      Resistance Training   Training Prescription Yes    Weight 4 lbs    Reps 10-15    Time 10 Minutes      Interval Training   Interval Training No      Recumbant Bike   Level 2.5    Minutes 15    METs 3.4      NuStep   Level 2    SPM 85    Minutes 15    METs 2.6             Social History   Tobacco Use  Smoking Status Former   Types: Cigarettes   Quit date: 06/06/2000   Years since quitting: 22.0  Smokeless Tobacco Never  Tobacco Comments   2002    Goals Met:  Exercise tolerated well No report of concerns or symptoms today Strength training completed today  Goals Unmet:  Not Applicable  Comments: See Harriett Sine RN MHA note.   Dr. Armanda Magic is Medical Director for Cardiac Rehab at [facility].

## 2022-06-27 ENCOUNTER — Encounter (HOSPITAL_COMMUNITY)
Admission: RE | Admit: 2022-06-27 | Discharge: 2022-06-27 | Disposition: A | Discharge status: Home / Self Care | Payer: PPO | Source: Ambulatory Visit | Attending: Interventional Cardiology | Admitting: Interventional Cardiology

## 2022-06-27 DIAGNOSIS — I214 Non-ST elevation (NSTEMI) myocardial infarction: Secondary | ICD-10-CM

## 2022-06-27 DIAGNOSIS — Z9861 Coronary angioplasty status: Secondary | ICD-10-CM

## 2022-06-27 LAB — GLUCOSE, CAPILLARY
Glucose-Capillary: 177 mg/dL — ABNORMAL HIGH (ref 70–99)
Glucose-Capillary: 98 mg/dL (ref 70–99)

## 2022-06-29 ENCOUNTER — Encounter (HOSPITAL_COMMUNITY)
Admission: RE | Admit: 2022-06-29 | Discharge: 2022-06-29 | Disposition: A | Discharge status: Home / Self Care | Payer: PPO | Source: Ambulatory Visit | Attending: Interventional Cardiology | Admitting: Interventional Cardiology

## 2022-06-29 DIAGNOSIS — I214 Non-ST elevation (NSTEMI) myocardial infarction: Secondary | ICD-10-CM

## 2022-06-29 DIAGNOSIS — Z9861 Coronary angioplasty status: Secondary | ICD-10-CM

## 2022-06-29 LAB — GLUCOSE, CAPILLARY: Glucose-Capillary: 122 mg/dL — ABNORMAL HIGH (ref 70–99)

## 2022-07-02 ENCOUNTER — Encounter (HOSPITAL_COMMUNITY)
Admission: RE | Admit: 2022-07-02 | Discharge: 2022-07-02 | Disposition: A | Discharge status: Home / Self Care | Payer: PPO | Source: Ambulatory Visit | Attending: Interventional Cardiology | Admitting: Interventional Cardiology

## 2022-07-02 DIAGNOSIS — I214 Non-ST elevation (NSTEMI) myocardial infarction: Secondary | ICD-10-CM

## 2022-07-02 DIAGNOSIS — Z9861 Coronary angioplasty status: Secondary | ICD-10-CM

## 2022-07-02 LAB — GLUCOSE, CAPILLARY
Glucose-Capillary: 107 mg/dL — ABNORMAL HIGH (ref 70–99)
Glucose-Capillary: 98 mg/dL (ref 70–99)

## 2022-07-04 ENCOUNTER — Encounter (HOSPITAL_COMMUNITY): Payer: PPO

## 2022-07-06 ENCOUNTER — Encounter (HOSPITAL_COMMUNITY)
Admission: RE | Admit: 2022-07-06 | Discharge: 2022-07-06 | Disposition: A | Discharge status: Home / Self Care | Payer: PPO | Source: Ambulatory Visit | Attending: Interventional Cardiology | Admitting: Interventional Cardiology

## 2022-07-06 DIAGNOSIS — Z9861 Coronary angioplasty status: Secondary | ICD-10-CM | POA: Diagnosis present

## 2022-07-06 DIAGNOSIS — I214 Non-ST elevation (NSTEMI) myocardial infarction: Secondary | ICD-10-CM | POA: Insufficient documentation

## 2022-07-09 ENCOUNTER — Encounter (HOSPITAL_COMMUNITY): Admission: RE | Admit: 2022-07-09 | Payer: PPO | Source: Ambulatory Visit

## 2022-07-11 ENCOUNTER — Encounter (HOSPITAL_COMMUNITY)
Admission: RE | Admit: 2022-07-11 | Discharge: 2022-07-11 | Disposition: A | Discharge status: Home / Self Care | Payer: PPO | Source: Ambulatory Visit | Attending: Interventional Cardiology | Admitting: Interventional Cardiology

## 2022-07-11 DIAGNOSIS — I214 Non-ST elevation (NSTEMI) myocardial infarction: Secondary | ICD-10-CM

## 2022-07-11 DIAGNOSIS — Z9861 Coronary angioplasty status: Secondary | ICD-10-CM

## 2022-07-13 ENCOUNTER — Encounter (HOSPITAL_COMMUNITY): Payer: PPO

## 2022-07-16 ENCOUNTER — Encounter (HOSPITAL_COMMUNITY)
Admission: RE | Admit: 2022-07-16 | Discharge: 2022-07-16 | Disposition: A | Discharge status: Home / Self Care | Payer: PPO | Source: Ambulatory Visit | Attending: Interventional Cardiology | Admitting: Interventional Cardiology

## 2022-07-16 DIAGNOSIS — I214 Non-ST elevation (NSTEMI) myocardial infarction: Secondary | ICD-10-CM | POA: Diagnosis not present

## 2022-07-16 DIAGNOSIS — Z9861 Coronary angioplasty status: Secondary | ICD-10-CM

## 2022-07-18 ENCOUNTER — Encounter (HOSPITAL_COMMUNITY)
Admission: RE | Admit: 2022-07-18 | Discharge: 2022-07-18 | Disposition: A | Discharge status: Home / Self Care | Payer: PPO | Source: Ambulatory Visit | Attending: Interventional Cardiology | Admitting: Interventional Cardiology

## 2022-07-18 DIAGNOSIS — I214 Non-ST elevation (NSTEMI) myocardial infarction: Secondary | ICD-10-CM | POA: Diagnosis not present

## 2022-07-18 DIAGNOSIS — Z9861 Coronary angioplasty status: Secondary | ICD-10-CM

## 2022-07-18 NOTE — Progress Notes (Signed)
CARDIAC REHAB PHASE 2  Reviewed home exercise with pt today. Pt is tolerating exercise well. Pt will continue to exercise on his own by walking, [employer] and stretch classes for 30-45 minutes per session 2-4 days a week in addition to the 3 days in [facility]. Advised pt on THRR, RPE scale, hydration and temperature/humidity precautions. Reinforced NTG use, S/S to stop exercise and when to call MD vs 911. Encouraged warm up cool down and stretches with exercise sessions. Pt verbalized understanding, all questions were answered and pt was given a copy to take home.    Harrie Jeans ACSM-CEP 07/18/2022 9:20 AM

## 2022-07-20 ENCOUNTER — Encounter (HOSPITAL_COMMUNITY)
Admission: RE | Admit: 2022-07-20 | Discharge: 2022-07-20 | Disposition: A | Discharge status: Home / Self Care | Payer: PPO | Source: Ambulatory Visit | Attending: Interventional Cardiology | Admitting: Interventional Cardiology

## 2022-07-20 DIAGNOSIS — Z9861 Coronary angioplasty status: Secondary | ICD-10-CM

## 2022-07-20 DIAGNOSIS — I214 Non-ST elevation (NSTEMI) myocardial infarction: Secondary | ICD-10-CM

## 2022-07-23 ENCOUNTER — Encounter (HOSPITAL_COMMUNITY)
Admission: RE | Admit: 2022-07-23 | Discharge: 2022-07-23 | Disposition: A | Discharge status: Home / Self Care | Payer: PPO | Source: Ambulatory Visit | Attending: Interventional Cardiology | Admitting: Interventional Cardiology

## 2022-07-23 DIAGNOSIS — I214 Non-ST elevation (NSTEMI) myocardial infarction: Secondary | ICD-10-CM | POA: Diagnosis not present

## 2022-07-23 DIAGNOSIS — Z9861 Coronary angioplasty status: Secondary | ICD-10-CM

## 2022-07-25 ENCOUNTER — Encounter (HOSPITAL_COMMUNITY)
Admission: RE | Admit: 2022-07-25 | Discharge: 2022-07-25 | Disposition: A | Discharge status: Home / Self Care | Payer: PPO | Source: Ambulatory Visit | Attending: Interventional Cardiology | Admitting: Interventional Cardiology

## 2022-07-25 DIAGNOSIS — I214 Non-ST elevation (NSTEMI) myocardial infarction: Secondary | ICD-10-CM | POA: Diagnosis not present

## 2022-07-25 DIAGNOSIS — Z9861 Coronary angioplasty status: Secondary | ICD-10-CM

## 2022-07-27 ENCOUNTER — Encounter (HOSPITAL_COMMUNITY)
Admission: RE | Admit: 2022-07-27 | Discharge: 2022-07-27 | Disposition: A | Discharge status: Home / Self Care | Payer: PPO | Source: Ambulatory Visit | Attending: Interventional Cardiology | Admitting: Interventional Cardiology

## 2022-07-27 DIAGNOSIS — I214 Non-ST elevation (NSTEMI) myocardial infarction: Secondary | ICD-10-CM

## 2022-07-27 DIAGNOSIS — Z9861 Coronary angioplasty status: Secondary | ICD-10-CM

## 2022-08-01 ENCOUNTER — Encounter (HOSPITAL_COMMUNITY): Payer: PPO

## 2022-08-01 ENCOUNTER — Telehealth (HOSPITAL_COMMUNITY): Payer: Self-pay | Admitting: *Deleted

## 2022-08-03 ENCOUNTER — Encounter (HOSPITAL_COMMUNITY): Payer: PPO

## 2022-08-06 ENCOUNTER — Encounter (HOSPITAL_COMMUNITY)
Admission: RE | Admit: 2022-08-06 | Discharge: 2022-08-06 | Disposition: A | Discharge status: Home / Self Care | Payer: PPO | Source: Ambulatory Visit | Attending: Interventional Cardiology | Admitting: Interventional Cardiology

## 2022-08-06 DIAGNOSIS — I214 Non-ST elevation (NSTEMI) myocardial infarction: Secondary | ICD-10-CM | POA: Diagnosis present

## 2022-08-06 DIAGNOSIS — Z9861 Coronary angioplasty status: Secondary | ICD-10-CM | POA: Insufficient documentation

## 2022-08-06 NOTE — Progress Notes (Signed)
Cardiac Individual Treatment Plan  Patient Details  Name: [name] MRN: [id number] Date of Birth: [birth year] Referring Provider:   Flowsheet Row INTENSIVE CARDIAC REHAB ORIENT from 06/19/2022 in [facility]  Referring Provider Corky Crafts, MD       Initial Encounter Date:  Flowsheet Row INTENSIVE CARDIAC REHAB ORIENT from 06/19/2022 in [facility]  Date 06/19/22       Visit Diagnosis: 05/23/22 NSTEMI  05/23/22 S/P PTCA of of CFX  Patient's Home Medications on Admission:  Current Outpatient Medications:    acetaminophen (TYLENOL) 325 MG tablet, Take 2 tablets (650 mg total) by mouth every 4 (four) hours as needed for headache or mild pain., Disp: 20 tablet, Rfl: 0   Ascorbic Acid (VITAMIN C) 1000 MG tablet, Take 1,000 mg by mouth daily., Disp: , Rfl:    aspirin 81 MG chewable tablet, Chew 1 tablet (81 mg total) by mouth daily., Disp: 30 tablet, Rfl: 0   cholecalciferol (VITAMIN D) 1000 UNITS tablet, Take 5,000 Units by mouth daily. , Disp: , Rfl:    Coenzyme Q10 (COQ10) 100 MG CAPS, Take 100 mg by mouth daily. Per patient taking 200 mg, Disp: , Rfl:    cyclobenzaprine (FLEXERIL) 10 MG tablet, Take 10 mg by mouth at bedtime as needed., Disp: , Rfl:    darifenacin (ENABLEX) 15 MG 24 hr tablet, Take 15 mg by mouth daily. , Disp: , Rfl:    DEXILANT 60 MG capsule, TAKE 1 CAPSULE BY MOUTH ONCE DAILY, Disp: 90 capsule, Rfl: 1   ezetimibe (ZETIA) 10 MG tablet, Take 10 mg by mouth daily., Disp: , Rfl:    famotidine (PEPCID) 20 MG tablet, Take 20 mg by mouth 2 (two) times daily., Disp: , Rfl:    hydrOXYzine (ATARAX/VISTARIL) 25 MG tablet, Take 1 tablet (25 mg total) by mouth 2 (two) times daily., Disp: 90 tablet, Rfl: 0   isosorbide mononitrate (IMDUR) 60 MG 24 hr tablet, TAKE 1 TABLET BY MOUTH ONCE DAILY, Disp: 90 tablet, Rfl: 3   meloxicam (MOBIC) 15 MG  tablet, Take 1 tablet by mouth as needed., Disp: , Rfl:    metoprolol succinate (TOPROL-XL) 50 MG 24 hr tablet, Take 1.5 tablets (75 mg total) by mouth daily. Take with or immediately following a meal., Disp: 135 tablet, Rfl: 2   MYRBETRIQ 50 MG TB24 tablet, Take 50 mg by mouth daily., Disp: , Rfl:    nilotinib (TASIGNA) 200 MG capsule, Take 400 mg by mouth daily. Per patient taking 200 mg daily, Disp: , Rfl:    nitroGLYCERIN (NITROSTAT) 0.4 MG SL tablet, Place 1 tablet (0.4 mg total) under the tongue every 5 (five) minutes as needed for chest pain., Disp: 25 tablet, Rfl: 12   olmesartan (BENICAR) 20 MG tablet, Take 2 tablets (40 mg total) by mouth daily., Disp: 90 tablet, Rfl: 3   Omega-3 Fatty Acids (FISH OIL PO), Take by mouth., Disp: , Rfl:    OZEMPIC, 0.25 OR 0.5 MG/DOSE, 2 MG/3ML SOPN, Inject 0.5 mg into the skin once a week., Disp: , Rfl:    rosuvastatin (CRESTOR) 40 MG tablet, Take 1 tablet (40 mg total) by mouth daily., Disp: 90 tablet, Rfl: 3   ticagrelor (BRILINTA) 90 MG TABS tablet, Take 1 tablet (90 mg total) by mouth 2 (two) times daily. (Patient taking differently: Take 90 mg by mouth 2 (two) times daily. Per patient taking  once daily. Will start taking twice a day), Disp: 60 tablet, Rfl: 0   VITAMIN E PO, Take 1 tablet by mouth daily., Disp: , Rfl:   Past Medical History: Past Medical History:  Diagnosis Date   Arthritis    Bladder disorder    Cancer (HCC)    hx prostate cancer and CML   Chronic myelocytic leukemia (HCC)    Coronary artery disease    with prior MI 2002([facility]) and Cfx(2006) and RCA (2004) stents . EF 45-50%   Coronary atherosclerosis of native coronary artery    stable and suspect a component of CAS   Diverticulosis    seen on colonoscopy in 2008   Goals of care, counseling/discussion 09/13/2016   H/O colonoscopy 2014   Hyperlipidemia    Hypertension    Myocardial infarction (HCC) 02,6/12   Prostate cancer (HCC)     Tobacco Use: Social History    Tobacco Use  Smoking Status Former   Types: Cigarettes   Quit date: 06/06/2000   Years since quitting: 22.1  Smokeless Tobacco Never  Tobacco Comments   2002    Labs: Review Flowsheet  More data exists      Latest Ref Rng & Units 07/22/2015 10/18/2015 05/28/2016 12/19/2017 05/24/2022  Labs for ITP Cardiac and Pulmonary Rehab  Cholestrol 0 - 200 mg/dL 161  096  045  409  811   LDL (calc) 0 - 99 mg/dL 914  78  92  97  58   HDL-C >40 mg/dL 44  40  42  51  38   Trlycerides <150 mg/dL 76  53  782  67  38   Hemoglobin A1c 4.8 - 5.6 % - - - 6.1  6.1     Capillary Blood Glucose: Lab Results  Component Value Date   GLUCAP 98 07/02/2022   GLUCAP 107 (H) 07/02/2022   GLUCAP 122 (H) 06/29/2022   GLUCAP 98 06/27/2022   GLUCAP 177 (H) 06/27/2022     Exercise Target Goals: Exercise Program Goal: Individual exercise prescription set using results from initial 6 min walk test and THRR while considering  patient's activity barriers and safety.   Exercise Prescription Goal: Initial exercise prescription builds to 30-45 minutes a day of aerobic activity, 2-3 days per week.  Home exercise guidelines will be given to patient during program as part of exercise prescription that the participant will acknowledge.  Activity Barriers & Risk Stratification:  Activity Barriers & Cardiac Risk Stratification - 06/19/22 0901       Activity Barriers & Cardiac Risk Stratification   Activity Barriers Arthritis;Other (comment);Neck/Spine Problems;Joint Problems    Comments Right ankle has 9 screws and a plate, snapped ligament in right arm and right finger- reattached, multiple ganglian cysts removed from right atm, hx fractured left wrist, ablation C3-C7 because of neck/shoulder/arm pain, hx torn left hamstrin, bone removal right great toe. Really bad arthritis in both hands/fingers, limited flexibility both wrists.    Cardiac Risk Stratification High             6 Minute Walk:  6 Minute Walk      Row Name 06/19/22 0915         6 Minute Walk   Phase Initial     Distance 1418 feet     Walk Time 6 minutes     # of Rest Breaks 0     MPH 2.68     METS 3.25     RPE 7  Perceived Dyspnea  0     VO2 Peak 11.37     Symptoms No     Resting HR 95 bpm     Resting BP 118/78     Resting Oxygen Saturation  99 %     Exercise Oxygen Saturation  during 6 min walk 98 %     Max Ex. HR 102 bpm     Max Ex. BP 100/66     2 Minute Post BP 110/72              Oxygen Initial Assessment:   Oxygen Re-Evaluation:   Oxygen Discharge (Final Oxygen Re-Evaluation):   Initial Exercise Prescription:  Initial Exercise Prescription - 06/19/22 0900       Date of Initial Exercise RX and Referring Provider   Date 06/19/22    Referring Provider Corky Crafts, MD    Expected Discharge Date 08/31/22      Recumbant Bike   Level 2    Watts 30    Minutes 15    METs 3.2      NuStep   Level 2    SPM 85    Minutes 15    METs 2.5      Prescription Details   Frequency (times per week) 3    Duration Progress to 30 minutes of continuous aerobic without signs/symptoms of physical distress      Intensity   THRR 40-80% of Max Heartrate 62-124    Ratings of Perceived Exertion 11-13    Perceived Dyspnea 0-4      Progression   Progression Continue to progress workloads to maintain intensity without signs/symptoms of physical distress.      Resistance Training   Training Prescription Yes    Weight 4 lbs    Reps 10-15             Perform Capillary Blood Glucose checks as needed.  Exercise Prescription Changes:   Exercise Prescription Changes     Row Name 06/25/22 0654 07/11/22 0830 07/18/22 0912 08/06/22 0959       Response to Exercise   Blood Pressure (Admit) 114/72 112/78 102/70 104/62    Blood Pressure (Exercise) 126/68 128/74 118/74 124/70    Blood Pressure (Exit) 104/66 104/70 118/78 100/64    Heart Rate (Admit) 87 bpm 86 bpm 84 bpm 98 bpm    Heart Rate  (Exercise) 120 bpm 130 bpm 114 bpm 128 bpm    Heart Rate (Exit) 100 bpm 96 bpm 92 bpm 99 bpm    Rating of Perceived Exertion (Exercise) 9 10 10  11.5    Symptoms None None None None    Comments Off to a great start with exercise. Reviewed MET's Reviewed goals and home ExRx REVD MET's    Duration Continue with 30 min of aerobic exercise without signs/symptoms of physical distress. Continue with 30 min of aerobic exercise without signs/symptoms of physical distress. Continue with 30 min of aerobic exercise without signs/symptoms of physical distress. Continue with 30 min of aerobic exercise without signs/symptoms of physical distress.    Intensity THRR unchanged THRR unchanged THRR unchanged THRR unchanged      Progression   Progression Continue to progress workloads to maintain intensity without signs/symptoms of physical distress. Continue to progress workloads to maintain intensity without signs/symptoms of physical distress. Continue to progress workloads to maintain intensity without signs/symptoms of physical distress. Continue to progress workloads to maintain intensity without signs/symptoms of physical distress.    Average METs 3 4.23 4.2  5.45      Resistance Training   Training Prescription Yes No No No    Weight 4 lbs -- -- --    Reps 10-15 -- -- --    Time 10 Minutes -- -- --      Interval Training   Interval Training No No No No      Recumbant Bike   Level 2.5 -- -- --    Minutes 15 -- -- --    METs 3.4 -- -- --      NuStep   Level 2 2 2 4     SPM 85 120 116 --    Minutes 15 15 15 15     METs 2.6 3.1 2 2.9      Rower   Level -- 7 7 7     Watts -- 42 30 47    Minutes -- 15 15 15     METs -- 5.36 6.4 7.99      Home Exercise Plan   Plans to continue exercise at -- -- [employer] (comment) [employer] (comment)    Frequency -- -- Add 4 additional days to program exercise sessions. Add 4 additional days to program exercise sessions.    Initial Home Exercises  Provided -- -- 07/18/22 07/18/22             Exercise Comments:   Exercise Comments     Row Name 06/25/22 0834 07/11/22 0830 07/18/22 0919 08/06/22 1001     Exercise Comments Pt completed first day of exercise. Discussed exercise prescription, RPE scale, and target heart rate range. Franko exercised for 15 min on the Nustep and recumbent bike. He averaged 2.6 METs at level 2 on the Nustep and 3.4 METs at level 2.5 on the recumbent bike. He performed the warmup and cooldown standing without any limitations. Reviewed MET's with pt today. Pt tolerated exercise well with an average MET level of 4.23. Will continue to monitor pt and progress workloads as tolerated without sign or symptom Reviewed goals and home ExRx. Pt tolerated exercise well with an average MET level of 4.2. Pt will exercie on his own by going to stretch classes, [employer] and walking 5-6 days a week for 30-45 mins per session. Pt feels good about progress so far and gave some additional resouces for gradual weight training at the [facility] Reviewed MET's roday. Pt tolerated exercise well with an anverage MET level of 5.45. Pt is feeling good about the progress he's making and is increasing MET's             Exercise Goals and Review:   Exercise Goals     Row Name 06/19/22 0901 06/25/22 0825           Exercise Goals   Increase Physical Activity Yes Yes      Intervention Provide advice, education, support and counseling about physical activity/exercise needs.;Develop an individualized exercise prescription for aerobic and resistive training based on initial evaluation findings, risk stratification, comorbidities and participant's personal goals. Provide advice, education, support and counseling about physical activity/exercise needs.;Develop an individualized exercise prescription for aerobic and resistive training based on initial evaluation findings, risk stratification, comorbidities and participant's personal goals.       Expected Outcomes Short Term: Attend rehab on a regular basis to increase amount of physical activity.;Long Term: Add in home exercise to make exercise part of routine and to increase amount of physical activity.;Long Term: Exercising regularly at least 3-5 days a week. Short Term: Attend rehab on a  regular basis to increase amount of physical activity.;Long Term: Add in home exercise to make exercise part of routine and to increase amount of physical activity.;Long Term: Exercising regularly at least 3-5 days a week.      Increase Strength and Stamina Yes Yes      Intervention Provide advice, education, support and counseling about physical activity/exercise needs.;Develop an individualized exercise prescription for aerobic and resistive training based on initial evaluation findings, risk stratification, comorbidities and participant's personal goals. Provide advice, education, support and counseling about physical activity/exercise needs.;Develop an individualized exercise prescription for aerobic and resistive training based on initial evaluation findings, risk stratification, comorbidities and participant's personal goals.      Expected Outcomes Short Term: Increase workloads from initial exercise prescription for resistance, speed, and METs.;Short Term: Perform resistance training exercises routinely during rehab and add in resistance training at home;Long Term: Improve cardiorespiratory fitness, muscular endurance and strength as measured by increased METs and functional capacity ( ) Short Term: Increase workloads from initial exercise prescription for resistance, speed, and METs.;Short Term: Perform resistance training exercises routinely during rehab and add in resistance training at home;Long Term: Improve cardiorespiratory fitness, muscular endurance and strength as measured by increased METs and functional capacity ( )      Able to understand and use rate of perceived exertion (RPE) scale Yes  Yes      Intervention Provide education and explanation on how to use RPE scale Provide education and explanation on how to use RPE scale      Expected Outcomes Short Term: Able to use RPE daily in rehab to express subjective intensity level;Long Term:  Able to use RPE to guide intensity level when exercising independently Short Term: Able to use RPE daily in rehab to express subjective intensity level;Long Term:  Able to use RPE to guide intensity level when exercising independently      Knowledge and understanding of Target Heart Rate Range (THRR) Yes Yes      Intervention Provide education and explanation of THRR including how the numbers were predicted and where they are located for reference Provide education and explanation of THRR including how the numbers were predicted and where they are located for reference      Expected Outcomes Short Term: Able to state/look up THRR;Long Term: Able to use THRR to govern intensity when exercising independently;Short Term: Able to use daily as guideline for intensity in rehab Short Term: Able to state/look up THRR;Long Term: Able to use THRR to govern intensity when exercising independently;Short Term: Able to use daily as guideline for intensity in rehab      Able to check pulse independently Yes Yes      Intervention Provide education and demonstration on how to check pulse in carotid and radial arteries.;Review the importance of being able to check your own pulse for safety during independent exercise Provide education and demonstration on how to check pulse in carotid and radial arteries.;Review the importance of being able to check your own pulse for safety during independent exercise      Expected Outcomes Short Term: Able to explain why pulse checking is important during independent exercise;Long Term: Able to check pulse independently and accurately Short Term: Able to explain why pulse checking is important during independent exercise;Long Term: Able to  check pulse independently and accurately      Understanding of Exercise Prescription Yes Yes      Intervention Provide education, explanation, and written materials on patient's individual exercise prescription Provide education, explanation, and  written materials on patient's individual exercise prescription      Expected Outcomes Short Term: Able to explain program exercise prescription;Long Term: Able to explain home exercise prescription to exercise independently Short Term: Able to explain program exercise prescription;Long Term: Able to explain home exercise prescription to exercise independently               Exercise Goals Re-Evaluation :  Exercise Goals Re-Evaluation     Row Name 06/25/22 0810 06/25/22 0825 07/18/22 0916         Exercise Goal Re-Evaluation   Exercise Goals Review Increase Physical Activity;Understanding of Exercise Prescription;Increase Strength and Stamina;Knowledge and understanding of Target Heart Rate Range (THRR);Able to check pulse independently;Able to understand and use rate of perceived exertion (RPE) scale Increase Physical Activity;Understanding of Exercise Prescription;Increase Strength and Stamina;Knowledge and understanding of Target Heart Rate Range (THRR);Able to check pulse independently;Able to understand and use rate of perceived exertion (RPE) scale Increase Physical Activity;Understanding of Exercise Prescription;Increase Strength and Stamina;Knowledge and understanding of Target Heart Rate Range (THRR);Able to check pulse independently;Able to understand and use rate of perceived exertion (RPE) scale     Comments Pt completed first day of exercise. Discussed exercise prescription, RPE scale, and target heart rate range. Tekoa exercised for 15 min on the Nustep and recumbent bike. He averaged 2.6 METs at level 2 on the Nustep and 3.4 METs  at level 2.5 on the recumbent bike. He performed the warmup and cooldown standing without any limitations. Pt  completed first day of exercise. Discussed exercise prescription, RPE scale, and target heart rate range. Corey exercised for 15 min on the Nustep and recumbent bike. He averaged 2.6 METs at level 2 on the Nustep and 3.4 METs  at level 2.5 on the recumbent bike. He performed the warmup and cooldown standing without any limitations. Reviewed goals and home ExRx. Pt tolerated exercise well with an average MET level of 4.2. Pt will exercie on his own by going to stretch classes, [employer] and walking 5-6 days a week for 30-45 mins per session. Pt feels good about progress so far and gave some additional resouces for gradual weight training at the [facility]     Expected Outcomes Will continue to monitor pt and progress workloads as tolerated without sign or symtpom Will continue to monitor pt and progress workloads as tolerated without sign or symtpom Will continue to monitor pt and progress workloads as tolerated without sign or symtpom              Discharge Exercise Prescription (Final Exercise Prescription Changes):  Exercise Prescription Changes - 08/06/22 0959       Response to Exercise   Blood Pressure (Admit) 104/62    Blood Pressure (Exercise) 124/70    Blood Pressure (Exit) 100/64    Heart Rate (Admit) 98 bpm    Heart Rate (Exercise) 128 bpm    Heart Rate (Exit) 99 bpm    Rating of Perceived Exertion (Exercise) 11.5    Symptoms None    Comments REVD MET's    Duration Continue with 30 min of aerobic exercise without signs/symptoms of physical distress.    Intensity THRR unchanged      Progression   Progression Continue to progress workloads to maintain intensity without signs/symptoms of physical distress.    Average METs 5.45      Resistance Training   Training Prescription No      Interval Training   Interval Training No      NuStep  Level 4    Minutes 15    METs 2.9      Rower   Level 7    Watts 47    Minutes 15    METs 7.99      Home Exercise Plan   Plans to  continue exercise at [facility] (comment)    Frequency Add 4 additional days to program exercise sessions.    Initial Home Exercises Provided 07/18/22             Nutrition:  Target Goals: Understanding of nutrition guidelines, daily intake of sodium 1500mg , cholesterol 200mg , calories 30% from fat and 7% or less from saturated fats, daily to have 5 or more servings of fruits and vegetables.  Biometrics:  Pre Biometrics - 06/19/22 0825       Pre Biometrics   Waist Circumference 36.75 inches    Hip Circumference 38 inches    Waist to Hip Ratio 0.97 %    Triceps Skinfold 13 mm    % Body Fat 24.8 %    Grip Strength 28 kg    Flexibility 14 in    Single Leg Stand 30 seconds              Nutrition Therapy Plan and Nutrition Goals:  Nutrition Therapy & Goals - 07/23/22 0906       Nutrition Therapy   Diet Heart Healthy Diet    Drug/Food Interactions Statins/Certain Fruits      Personal Nutrition Goals   Nutrition Goal Patient to identify strategies for reducing cardiovascular risk by attending the [employer] education and nutrition series weekly.    Personal Goal #2 Patient to improve diet quality by using the plate method as a guide for meal planning to include lean protein/plant protein, fruits, vegetables, whole grains, nonfat dairy as part of a well-balanced diet.    Comments Javeyon does not attend the [employer] education and nutrition series. He continues Ozempic to aid with weightloss; he is down ~3# since starting with our program. Emryk will benefit from participation in intensive cardiac rehab for nutrition, exercise, and lifestyle modification.      Intervention Plan   Intervention Prescribe, educate and counsel regarding individualized specific dietary modifications aiming towards targeted core components such as weight, hypertension, lipid management, diabetes, heart failure and other comorbidities.;Nutrition handout(s) given to patient.    Expected  Outcomes Short Term Goal: Understand basic principles of dietary content, such as calories, fat, sodium, cholesterol and nutrients.;Long Term Goal: Adherence to prescribed nutrition plan.             Nutrition Assessments:  MEDIFICTS Score Key: ?70 Need to make dietary changes  40-70 Heart Healthy Diet ? 40 Therapeutic Level Cholesterol Diet    Picture Your Plate Scores: <16 Unhealthy dietary pattern with much room for improvement. 41-50 Dietary pattern unlikely to meet recommendations for good health and room for improvement. 51-60 More healthful dietary pattern, with some room for improvement.  >60 Healthy dietary pattern, although there may be some specific behaviors that could be improved.    Nutrition Goals Re-Evaluation:  Nutrition Goals Re-Evaluation     Row Name 06/25/22 0859 07/23/22 0906           Goals   Current Weight 174 lb 9.7 oz (79.2 kg) 170 lb 6.7 oz (77.3 kg)      Comment LipoproteinA 305, A1c 6.1 (taking ozempic), HDL 38 No new labs; most recent labs LipoproteinA 305, A1c 6.1 (taking ozempic), HDL 38  Expected Outcome Tano will benefit from participation in intensive cardiac rehab for nutrition, exercise, and lifestyle modification. Daniele does not attend the [employer] education and nutrition series. He continues Ozempic to aid with weightloss; he is down ~3# since starting with our program. Skylor will benefit from participation in intensive cardiac rehab for nutrition, exercise, and lifestyle modification.               Nutrition Goals Re-Evaluation:  Nutrition Goals Re-Evaluation     Row Name 06/25/22 0859 07/23/22 0906           Goals   Current Weight 174 lb 9.7 oz (79.2 kg) 170 lb 6.7 oz (77.3 kg)      Comment LipoproteinA 305, A1c 6.1 (taking ozempic), HDL 38 No new labs; most recent labs LipoproteinA 305, A1c 6.1 (taking ozempic), HDL 38      Expected Outcome Edmund will benefit from participation in intensive cardiac rehab  for nutrition, exercise, and lifestyle modification. Adarious does not attend the [employer] education and nutrition series. He continues Ozempic to aid with weightloss; he is down ~3# since starting with our program. Lenton will benefit from participation in intensive cardiac rehab for nutrition, exercise, and lifestyle modification.               Nutrition Goals Discharge (Final Nutrition Goals Re-Evaluation):  Nutrition Goals Re-Evaluation - 07/23/22 0906       Goals   Current Weight 170 lb 6.7 oz (77.3 kg)    Comment No new labs; most recent labs LipoproteinA 305, A1c 6.1 (taking ozempic), HDL 38    Expected Outcome Thaddues does not attend the [employer] education and nutrition series. He continues Ozempic to aid with weightloss; he is down ~3# since starting with our program. Yameen will benefit from participation in intensive cardiac rehab for nutrition, exercise, and lifestyle modification.             Psychosocial: Target Goals: Acknowledge presence or absence of significant depression and/or stress, maximize coping skills, provide positive support system. Participant is able to verbalize types and ability to use techniques and skills needed for reducing stress and depression.  Initial Review & Psychosocial Screening:  Initial Psych Review & Screening - 06/19/22 0931       Initial Review   Current issues with Current Stress Concerns    Source of Stress Concerns Family    Comments Denies being depressed taking care of elderly father,aunt who is at a nursing facility, and a brother      Family Dynamics   Good Support System? Yes   [name] has firends for support. [name] has a son who lives with him and is moving to [facility]     Barriers   Psychosocial barriers to participate in program The patient should benefit from training in stress management and relaxation.      Screening Interventions   Interventions Encouraged to exercise;To provide support and resources with  identified psychosocial needs    Expected Outcomes Long Term Goal: Stressors or current issues are controlled or eliminated.             Quality of Life Scores:  Quality of Life - 06/19/22 1013       Quality of Life   Select Quality of Life      Quality of Life Scores   Health/Function Pre 23.03 %    Socioeconomic Pre 23.79 %    Psych/Spiritual Pre 20.71 %    Family Pre 21.6 %    GLOBAL  Pre 22.5 %            Scores of 19 and below usually indicate a poorer quality of life in these areas.  A difference of  2-3 points is a clinically meaningful difference.  A difference of 2-3 points in the total score of the Quality of Life Index has been associated with significant improvement in overall quality of life, self-image, physical symptoms, and general health in studies assessing change in quality of life.  PHQ-9: Review Flowsheet  More data exists      06/19/2022 12/19/2017 06/04/2016 02/01/2016 08/15/2015  Depression screen PHQ 2/9  Decreased Interest 0 0 0 0 0  Down, Depressed, Hopeless 0 0 0 0 0  PHQ - 2 Score 0 0 0 0 0  Altered sleeping 0 - - - -  Tired, decreased energy 0 - - - -  Change in appetite 1 - - - -  Feeling bad or failure about yourself  0 - - - -  Trouble concentrating 0 - - - -  Moving slowly or fidgety/restless 0 - - - -  Suicidal thoughts 0 - - - -  PHQ-9 Score 1 - - - -  Difficult doing work/chores Not difficult at all - - - -   Interpretation of Total Score  Total Score Depression Severity:  1-4 = Minimal depression, 5-9 = Mild depression, 10-14 = Moderate depression, 15-19 = Moderately severe depression, 20-27 = Severe depression   Psychosocial Evaluation and Intervention:   Psychosocial Re-Evaluation:  Psychosocial Re-Evaluation     Row Name 06/25/22 1654 07/11/22 1623 08/06/22 0837         Psychosocial Re-Evaluation   Current issues with Current Stress Concerns Current Stress Concerns Current Stress Concerns     Comments [name] did not  voice any increased concerns or stressors on his first day of exercise [name] did not voice any increased concerns or stressors on his first day of exercise [name] has not voiced  any increased concerns or stressors at intensive cardiac rehab     Continue Psychosocial Services  Follow up required by counselor No Follow up required No Follow up required       Initial Review   Source of Stress Concerns Family Family Family     Comments Will continue to monitor and offer support as needed Will continue to monitor and offer support as needed Will continue to monitor and offer support as needed              Psychosocial Discharge (Final Psychosocial Re-Evaluation):  Psychosocial Re-Evaluation - 08/06/22 0837       Psychosocial Re-Evaluation   Current issues with Current Stress Concerns    Comments [name] has not voiced  any increased concerns or stressors at intensive cardiac rehab    Continue Psychosocial Services  No Follow up required      Initial Review   Source of Stress Concerns Family    Comments Will continue to monitor and offer support as needed             Vocational Rehabilitation: Provide vocational rehab assistance to qualifying candidates.   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 Participant will state understanding/return demonstration of topics presented.    Education     Row Name 06/25/22 0800     Education   Cardiac Education Topics Pritikin   Select Core Videos     Core Videos   Educator Exercise Physiologist   Select Exercise Education   Exercise Education Biomechanial  Limitations   Instruction Review Code 1- Verbalizes Understanding   Class Start Time [id number]   Class Stop Time 0855   Class Time Calculation (min) 41 min            Core Videos: Exercise    Move It!  Clinical staff conducted group or individual video education with verbal and written material and guidebook.  Patient learns the recommended Pritikin exercise program. Exercise with the goal of living a long, healthy life. Some of the health benefits of exercise include controlled diabetes, healthier blood pressure levels, improved cholesterol levels, improved heart and lung capacity, improved sleep, and better body composition. Everyone should speak with their doctor before starting or changing an exercise routine.  Biomechanical Limitations Clinical staff conducted group or individual video education with verbal and written material and guidebook.  Patient learns how biomechanical limitations can impact exercise and how we can mitigate and possibly overcome limitations to have an impactful and balanced exercise routine.  Body Composition Clinical staff conducted group or individual video education with verbal and written material and guidebook.  Patient learns that body composition (ratio of muscle mass to fat mass) is a key component to assessing overall fitness, rather than body weight alone. Increased fat mass, especially visceral belly fat, can put [place] at increased risk for metabolic syndrome, type 2 diabetes, heart disease, and even death. It is recommended to combine diet and exercise (cardiovascular and resistance training) to improve your body composition. Seek guidance from your physician and exercise physiologist before implementing an exercise routine.  Exercise Action Plan Clinical staff conducted group or individual video education with verbal and written material and guidebook.  Patient learns the recommended strategies to achieve and enjoy long-term exercise adherence, including  variety, self-motivation, self-efficacy, and positive decision making. Benefits of exercise include fitness, good health, weight management, more energy, better sleep, less stress, and overall well-being.  Medical   Heart Disease Risk Reduction Clinical staff conducted group or individual video education with verbal and written material and guidebook.  Patient learns our heart is our most vital organ as it circulates oxygen, nutrients, white blood cells, and hormones throughout the entire body, and carries waste away. Data supports a plant-based eating plan like the Pritikin Program for its effectiveness in slowing progression of and reversing heart disease. The video provides a number of recommendations to address heart disease.   Metabolic Syndrome and Belly Fat  Clinical staff conducted group or individual video education with verbal and written material and guidebook.  Patient learns what metabolic syndrome is, how it leads to heart disease, and how one can reverse it and keep it from coming back. You have metabolic syndrome if you have 3 of the following 5 criteria: abdominal obesity, high blood pressure, high triglycerides, low HDL cholesterol, and high blood sugar.  Hypertension and Heart Disease Clinical staff conducted group or individual video education with verbal and written material and guidebook.  Patient learns that high blood pressure, or hypertension, is very common in the [place]. Hypertension is largely due to excessive salt intake, but other  important risk factors include being overweight, physical inactivity, drinking too much alcohol, smoking, and not eating enough potassium from fruits and vegetables. High blood pressure is a leading risk factor for heart attack, stroke, congestive heart failure, dementia, kidney failure, and premature death. Long-term effects of excessive salt intake include stiffening of the arteries and thickening of heart muscle and organ damage.  Recommendations include ways to reduce hypertension and the risk of heart disease.  Diseases of Our Time - Focusing on Diabetes Clinical staff conducted group or individual video education with verbal and written material and guidebook.  Patient learns why the best way to stop diseases of our time is prevention, through food and other lifestyle changes. Medicine (such as prescription pills and surgeries) is often only a Band-Aid on the problem, not a long-term solution. Most common diseases of our time include obesity, type 2 diabetes, hypertension, heart disease, and cancer. The Pritikin Program is recommended and has been proven to help reduce, reverse, and/or prevent the damaging effects of metabolic syndrome.  Nutrition   Overview of the Pritikin Eating Plan  Clinical staff conducted group or individual video education with verbal and written material and guidebook.  Patient learns about the Pritikin Eating Plan for disease risk reduction. The Pritikin Eating Plan emphasizes a wide variety of unrefined, minimally-processed carbohydrates, like fruits, vegetables, whole grains, and legumes. Go, Caution, and Stop food choices are explained. Plant-based and lean animal proteins are emphasized. Rationale provided for low sodium intake for blood pressure control, low added sugars for blood sugar stabilization, and low added fats and oils for coronary artery disease risk reduction and weight management.  Calorie Density  Clinical staff conducted group or individual video education with verbal and written material and guidebook.  Patient learns about calorie density and how it impacts the Pritikin Eating Plan. Knowing the characteristics of the food you choose will help you decide whether those foods will lead to weight gain or weight loss, and whether you want to consume more or less of them. Weight loss is usually a side effect of the Pritikin Eating Plan because of its focus on low calorie-dense  foods.  Label Reading  Clinical staff conducted group or individual video education with verbal and written material and guidebook.  Patient learns about the Pritikin recommended label reading guidelines and corresponding recommendations regarding calorie density, added sugars, sodium content, and whole grains.  Dining Out - Part 1  Clinical staff conducted group or individual video education with verbal and written material and guidebook.  Patient learns that restaurant meals can be sabotaging because they can be so high in calories, fat, sodium, and/or sugar. Patient learns recommended strategies on how to positively address this and avoid unhealthy pitfalls.  Facts on Fats  Clinical staff conducted group or individual video education with verbal and written material and guidebook.  Patient learns that lifestyle modifications can be just as effective, if not more so, as many medications for lowering your risk of heart disease. A Pritikin lifestyle can help to reduce your risk of inflammation and atherosclerosis (cholesterol build-up, or plaque, in the artery walls). Lifestyle interventions such as dietary choices and physical activity address the cause of atherosclerosis. A review of the types of fats and their impact on blood cholesterol levels, along with dietary recommendations to reduce fat intake is also included.  Nutrition Action Plan  Clinical staff conducted group or individual video education with verbal and written material and guidebook.  Patient learns how to incorporate Pritikin recommendations  into their lifestyle. Recommendations include planning and keeping personal health goals in mind as an important part of their success.  Healthy Mind-Set    Healthy Minds, Bodies, Hearts  Clinical staff conducted group or individual video education with verbal and written material and guidebook.  Patient learns how to identify when they are stressed. Video will discuss the impact of that  stress, as well as the many benefits of stress management. Patient will also be introduced to stress management techniques. The way we think, act, and feel has an impact on our hearts.  How Our Thoughts Can Heal Our Hearts  Clinical staff conducted group or individual video education with verbal and written material and guidebook.  Patient learns that negative thoughts can cause depression and anxiety. This can result in negative lifestyle behavior and serious health problems. Cognitive behavioral therapy is an effective method to help control our thoughts in order to change and improve our emotional outlook.  Additional Videos:  Exercise    Improving Performance  Clinical staff conducted group or individual video education with verbal and written material and guidebook.  Patient learns to use a non-linear approach by alternating intensity levels and lengths of time spent exercising to help burn more calories and lose more body fat. Cardiovascular exercise helps improve heart health, metabolism, hormonal balance, blood sugar control, and recovery from fatigue. Resistance training improves strength, endurance, balance, coordination, reaction time, metabolism, and muscle mass. Flexibility exercise improves circulation, posture, and balance. Seek guidance from your physician and exercise physiologist before implementing an exercise routine and learn your capabilities and proper form for all exercise.  Introduction to Yoga  Clinical staff conducted group or individual video education with verbal and written material and guidebook.  Patient learns about yoga, a discipline of the coming together of mind, breath, and body. The benefits of yoga include improved flexibility, improved range of motion, better posture and core strength, increased lung function, weight loss, and positive self-image. Yoga's heart health benefits include lowered blood pressure, healthier heart rate, decreased cholesterol and  triglyceride levels, improved immune function, and reduced stress. Seek guidance from your physician and exercise physiologist before implementing an exercise routine and learn your capabilities and proper form for all exercise.  Medical   Aging: Enhancing Your Quality of Life  Clinical staff conducted group or individual video education with verbal and written material and guidebook.  Patient learns key strategies and recommendations to stay in good physical health and enhance quality of life, such as prevention strategies, having an advocate, securing a Health Care Proxy and Power of Attorney, and keeping a list of medications and system for tracking them. It also discusses how to avoid risk for bone loss.  Biology of Weight Control  Clinical staff conducted group or individual video education with verbal and written material and guidebook.  Patient learns that weight gain occurs because we consume more calories than we burn (eating more, moving less). Even if your body weight is normal, you may have higher ratios of fat compared to muscle mass. Too much body fat puts you at increased risk for cardiovascular disease, heart attack, stroke, type 2 diabetes, and obesity-related cancers. In addition to exercise, following the Pritikin Eating Plan can help reduce your risk.  Decoding Lab Results  Clinical staff conducted group or individual video education with verbal and written material and guidebook.  Patient learns that lab test reflects one measurement whose values change over time and are influenced by many factors, including medication, stress, sleep,  exercise, food, hydration, pre-existing medical conditions, and more. It is recommended to use the knowledge from this video to become more involved with your lab results and evaluate your numbers to speak with your doctor.   Diseases of Our Time - Overview  Clinical staff conducted group or individual video education with verbal and written  material and guidebook.  Patient learns that according to the CDC, 50% to 70% of chronic diseases (such as obesity, type 2 diabetes, elevated lipids, hypertension, and heart disease) are avoidable through lifestyle improvements including healthier food choices, listening to satiety cues, and increased physical activity.  Sleep Disorders Clinical staff conducted group or individual video education with verbal and written material and guidebook.  Patient learns how good quality and duration of sleep are important to overall health and well-being. Patient also learns about sleep disorders and how they impact health along with recommendations to address them, including discussing with a physician.  Nutrition  Dining Out - Part 2 Clinical staff conducted group or individual video education with verbal and written material and guidebook.  Patient learns how to plan ahead and communicate in order to maximize their dining experience in a healthy and nutritious manner. Included are recommended food choices based on the type of restaurant the patient is visiting.   Fueling a Banker conducted group or individual video education with verbal and written material and guidebook.  There is a strong connection between our food choices and our health. Diseases like obesity and type 2 diabetes are very prevalent and are in large-part due to lifestyle choices. The Pritikin Eating Plan provides plenty of food and hunger-curbing satisfaction. It is easy to follow, affordable, and helps reduce health risks.  Menu Workshop  Clinical staff conducted group or individual video education with verbal and written material and guidebook.  Patient learns that restaurant meals can sabotage health goals because they are often packed with calories, fat, sodium, and sugar. Recommendations include strategies to plan ahead and to communicate with the manager, chef, or server to help order a healthier  meal.  Planning Your Eating Strategy  Clinical staff conducted group or individual video education with verbal and written material and guidebook.  Patient learns about the Pritikin Eating Plan and its benefit of reducing the risk of disease. The Pritikin Eating Plan does not focus on calories. Instead, it emphasizes high-quality, nutrient-rich foods. By knowing the characteristics of the foods, we choose, we can determine their calorie density and make informed decisions.  Targeting Your Nutrition Priorities  Clinical staff conducted group or individual video education with verbal and written material and guidebook.  Patient learns that lifestyle habits have a tremendous impact on disease risk and progression. This video provides eating and physical activity recommendations based on your personal health goals, such as reducing LDL cholesterol, losing weight, preventing or controlling type 2 diabetes, and reducing high blood pressure.  Vitamins and Minerals  Clinical staff conducted group or individual video education with verbal and written material and guidebook.  Patient learns different ways to obtain key vitamins and minerals, including through a recommended healthy diet. It is important to discuss all supplements you take with your doctor.   Healthy Mind-Set    Smoking Cessation  Clinical staff conducted group or individual video education with verbal and written material and guidebook.  Patient learns that cigarette smoking and tobacco addiction pose a serious health risk which affects millions of people. Stopping smoking will significantly reduce the risk of heart disease,  lung disease, and many forms of cancer. Recommended strategies for quitting are covered, including working with your doctor to develop a successful plan.  Culinary   Becoming a Set designer conducted group or individual video education with verbal and written material and guidebook.  Patient learns  that cooking at home can be healthy, cost-effective, quick, and puts them in control. Keys to cooking healthy recipes will include looking at your recipe, assessing your equipment needs, planning ahead, making it simple, choosing cost-effective seasonal ingredients, and limiting the use of added fats, salts, and sugars.  Cooking - Breakfast and Snacks  Clinical staff conducted group or individual video education with verbal and written material and guidebook.  Patient learns how important breakfast is to satiety and nutrition through the entire day. Recommendations include key foods to eat during breakfast to help stabilize blood sugar levels and to prevent overeating at meals later in the day. Planning ahead is also a key component.  Cooking - Educational psychologist conducted group or individual video education with verbal and written material and guidebook.  Patient learns eating strategies to improve overall health, including an approach to cook more at home. Recommendations include thinking of animal protein as a side on your plate rather than center stage and focusing instead on lower calorie dense options like vegetables, fruits, whole grains, and plant-based proteins, such as beans. Making sauces in large quantities to freeze for later and leaving the skin on your vegetables are also recommended to maximize your experience.  Cooking - Healthy Salads and Dressing Clinical staff conducted group or individual video education with verbal and written material and guidebook.  Patient learns that vegetables, fruits, whole grains, and legumes are the foundations of the Pritikin Eating Plan. Recommendations include how to incorporate each of these in flavorful and healthy salads, and how to create homemade salad dressings. Proper handling of ingredients is also covered. Cooking - Soups and [employer] - Soups and Desserts Clinical staff conducted group or individual video education with  verbal and written material and guidebook.  Patient learns that Pritikin soups and desserts make for easy, nutritious, and delicious snacks and meal components that are low in sodium, fat, sugar, and calorie density, while high in vitamins, minerals, and filling fiber. Recommendations include simple and healthy ideas for soups and desserts.   Overview     The Pritikin Solution Program Overview Clinical staff conducted group or individual video education with verbal and written material and guidebook.  Patient learns that the results of the Pritikin Program have been documented in more than 100 articles published in peer-reviewed journals, and the benefits include reducing risk factors for (and, in some cases, even reversing) high cholesterol, high blood pressure, type 2 diabetes, obesity, and more! An overview of the three key pillars of the Pritikin Program will be covered: eating well, doing regular exercise, and having a healthy mind-set.  WORKSHOPS  Exercise: Exercise Basics: Building Your Action Plan Clinical staff led group instruction and group discussion with PowerPoint presentation and patient guidebook. To enhance the learning environment the use of posters, models and videos may be added. At the conclusion of this workshop, patients will comprehend the difference between physical activity and exercise, as well as the benefits of incorporating both, into their routine. Patients will understand the FITT (Frequency, Intensity, Time, and Type) principle and how to use it to build an exercise action plan. In addition, safety concerns and other considerations for exercise and  cardiac rehab will be addressed by the presenter. The purpose of this lesson is to promote a comprehensive and effective weekly exercise routine in order to improve patients' overall level of fitness.   Managing Heart Disease: Your Path to a Healthier Heart Clinical staff led group instruction and group discussion with  PowerPoint presentation and patient guidebook. To enhance the learning environment the use of posters, models and videos may be added.At the conclusion of this workshop, patients will understand the anatomy and physiology of the heart. Additionally, they will understand how Pritikin's three pillars impact the risk factors, the progression, and the management of heart disease.  The purpose of this lesson is to provide a high-level overview of the heart, heart disease, and how the Pritikin lifestyle positively impacts risk factors.  Exercise Biomechanics Clinical staff led group instruction and group discussion with PowerPoint presentation and patient guidebook. To enhance the learning environment the use of posters, models and videos may be added. Patients will learn how the structural parts of their bodies function and how these functions impact their daily activities, movement, and exercise. Patients will learn how to promote a neutral spine, learn how to manage pain, and identify ways to improve their physical movement in order to promote healthy living. The purpose of this lesson is to expose patients to common physical limitations that impact physical activity. Participants will learn practical ways to adapt and manage aches and pains, and to minimize their effect on regular exercise. Patients will learn how to maintain good posture while sitting, walking, and lifting.  Balance Training and Fall Prevention  Clinical staff led group instruction and group discussion with PowerPoint presentation and patient guidebook. To enhance the learning environment the use of posters, models and videos may be added. At the conclusion of this workshop, patients will understand the importance of their sensorimotor skills (vision, proprioception, and the vestibular system) in maintaining their ability to balance as they age. Patients will apply a variety of balancing exercises that are appropriate for their  current level of function. Patients will understand the common causes for poor balance, possible solutions to these problems, and ways to modify their physical environment in order to minimize their fall risk. The purpose of this lesson is to teach patients about the importance of maintaining balance as they age and ways to minimize their risk of falling.  WORKSHOPS   Nutrition:  Fueling a Ship broker led group instruction and group discussion with PowerPoint presentation and patient guidebook. To enhance the learning environment the use of posters, models and videos may be added. Patients will review the foundational principles of the Pritikin Eating Plan and understand what constitutes a serving size in each of the food groups. Patients will also learn Pritikin-friendly foods that are better choices when away from home and review make-ahead meal and snack options. Calorie density will be reviewed and applied to three nutrition priorities: weight maintenance, weight loss, and weight gain. The purpose of this lesson is to reinforce (in a group setting) the key concepts around what patients are recommended to eat and how to apply these guidelines when away from home by planning and selecting Pritikin-friendly options. Patients will understand how calorie density may be adjusted for different weight management goals.  Mindful Eating  Clinical staff led group instruction and group discussion with PowerPoint presentation and patient guidebook. To enhance the learning environment the use of posters, models and videos may be added. Patients will briefly review the concepts of the Pritikin  Eating Plan and the importance of low-calorie dense foods. The concept of mindful eating will be introduced as well as the importance of paying attention to internal hunger signals. Triggers for non-hunger eating and techniques for dealing with triggers will be explored. The purpose of this lesson is to provide  patients with the opportunity to review the basic principles of the Pritikin Eating Plan, discuss the value of eating mindfully and how to measure internal cues of hunger and fullness using the Hunger Scale. Patients will also discuss reasons for non-hunger eating and learn strategies to use for controlling emotional eating.  Targeting Your Nutrition Priorities Clinical staff led group instruction and group discussion with PowerPoint presentation and patient guidebook. To enhance the learning environment the use of posters, models and videos may be added. Patients will learn how to determine their genetic susceptibility to disease by reviewing their family history. Patients will gain insight into the importance of diet as part of an overall healthy lifestyle in mitigating the impact of genetics and other environmental insults. The purpose of this lesson is to provide patients with the opportunity to assess their personal nutrition priorities by looking at their family history, their own health history and current risk factors. Patients will also be able to discuss ways of prioritizing and modifying the Pritikin Eating Plan for their highest risk areas  Menu  Clinical staff led group instruction and group discussion with PowerPoint presentation and patient guidebook. To enhance the learning environment the use of posters, models and videos may be added. Using menus brought in from [employer], or printed from [employer], patients will apply the Pritikin dining out guidelines that were presented in the [employer] video. Patients will also be able to practice these guidelines in a variety of provided scenarios. The purpose of this lesson is to provide patients with the opportunity to practice hands-on learning of the Pritikin Dining Out guidelines with actual menus and practice scenarios.  Label Reading Clinical staff led group instruction and group discussion with PowerPoint  presentation and patient guidebook. To enhance the learning environment the use of posters, models and videos may be added. Patients will review and discuss the Pritikin label reading guidelines presented in Pritikin's Label Reading Educational series video. Using fool labels brought in from local grocery stores and markets, patients will apply the label reading guidelines and determine if the packaged food meet the Pritikin guidelines. The purpose of this lesson is to provide patients with the opportunity to review, discuss, and practice hands-on learning of the Pritikin Label Reading guidelines with actual packaged food labels. Cooking School  Pritikin's [employer] are designed to teach patients ways to prepare quick, simple, and affordable recipes at home. The importance of nutrition's role in chronic disease risk reduction is reflected in its emphasis in the overall Pritikin program. By learning how to prepare essential core Pritikin Eating Plan recipes, patients will increase control over what they eat; be able to customize the flavor of foods without the use of added salt, sugar, or fat; and improve the quality of the food they consume. By learning a set of core recipes which are easily assembled, quickly prepared, and affordable, patients are more likely to prepare more healthy foods at home. These workshops focus on convenient breakfasts, simple entres, side dishes, and desserts which can be prepared with minimal effort and are consistent with nutrition recommendations for cardiovascular risk reduction. Cooking [employer] are taught by a Armed forces logistics/support/administrative officer (RD) who  has been trained by the [employer] team. The chef or RD has a clear understanding of the importance of minimizing - if not completely eliminating - added fat, sugar, and sodium in recipes. Throughout the series of Cooking School Workshop sessions, patients will learn about healthy ingredients and  efficient methods of cooking to build confidence in their capability to prepare    Cooking School weekly topics:  Adding Flavor- Sodium-Free  Fast and Healthy Breakfasts  Powerhouse Plant-Based Proteins  Satisfying Salads and Dressings  Simple Sides and Sauces  International Cuisine-Spotlight on the [employer] Zones  Delicious Desserts  Savory Soups  [employer] - Meals in a Astronomer Appetizers and Snacks  Comforting Weekend Breakfasts  One-Pot Wonders   Fast Evening Meals  Landscape architect Your Pritikin Plate  WORKSHOPS   Healthy Mindset (Psychosocial):  Focused Goals, Sustainable Changes Clinical staff led group instruction and group discussion with PowerPoint presentation and patient guidebook. To enhance the learning environment the use of posters, models and videos may be added. Patients will be able to apply effective goal setting strategies to establish at least one personal goal, and then take consistent, meaningful action toward that goal. They will learn to identify common barriers to achieving personal goals and develop strategies to overcome them. Patients will also gain an understanding of how our mind-set can impact our ability to achieve goals and the importance of cultivating a positive and growth-oriented mind-set. The purpose of this lesson is to provide patients with a deeper understanding of how to set and achieve personal goals, as well as the tools and strategies needed to overcome common obstacles which may arise along the way.  From Head to Heart: The Power of a Healthy Outlook  Clinical staff led group instruction and group discussion with PowerPoint presentation and patient guidebook. To enhance the learning environment the use of posters, models and videos may be added. Patients will be able to recognize and describe the impact of emotions and mood on physical health. They will discover the importance of self-care and explore self-care  practices which may work for them. Patients will also learn how to utilize the 4 C's to cultivate a healthier outlook and better manage stress and challenges. The purpose of this lesson is to demonstrate to patients how a healthy outlook is an essential part of maintaining good health, especially as they continue their cardiac rehab journey.  Healthy Sleep for a Healthy Heart Clinical staff led group instruction and group discussion with PowerPoint presentation and patient guidebook. To enhance the learning environment the use of posters, models and videos may be added. At the conclusion of this workshop, patients will be able to demonstrate knowledge of the importance of sleep to overall health, well-being, and quality of life. They will understand the symptoms of, and treatments for, common sleep disorders. Patients will also be able to identify daytime and nighttime behaviors which impact sleep, and they will be able to apply these tools to help manage sleep-related challenges. The purpose of this lesson is to provide patients with a general overview of sleep and outline the importance of quality sleep. Patients will learn about a few of the most common sleep disorders. Patients will also be introduced to the concept of "sleep hygiene," and discover ways to self-manage certain sleeping problems through simple daily behavior changes. Finally, the workshop will motivate patients by clarifying the links between quality sleep and their goals of heart-healthy living.   Recognizing and Reducing Stress  Clinical staff led group instruction and group discussion with PowerPoint presentation and patient guidebook. To enhance the learning environment the use of posters, models and videos may be added. At the conclusion of this workshop, patients will be able to understand the types of stress reactions, differentiate between acute and chronic stress, and recognize the impact that chronic stress has on their health. They  will also be able to apply different coping mechanisms, such as reframing negative self-talk. Patients will have the opportunity to practice a variety of stress management techniques, such as deep abdominal breathing, progressive muscle relaxation, and/or guided imagery.  The purpose of this lesson is to educate patients on the role of stress in their lives and to provide healthy techniques for coping with it.  Learning Barriers/Preferences:  Learning Barriers/Preferences - 06/19/22 1217       Learning Barriers/Preferences   Learning Barriers None    Learning Preferences Audio;Verbal Instruction             Education Topics:  Knowledge Questionnaire Score:  Knowledge Questionnaire Score - 06/19/22 1012       Knowledge Questionnaire Score   Pre Score 20/24    Post Score --             Core Components/Risk Factors/Patient Goals at Admission:  Personal Goals and Risk Factors at Admission - 06/19/22 0953       Core Components/Risk Factors/Patient Goals on Admission    Weight Management Yes    Intervention Weight Management: Develop a combined nutrition and exercise program designed to reach desired caloric intake, while maintaining appropriate intake of nutrient and fiber, sodium and fats, and appropriate energy expenditure required for the weight goal.;Weight Management: Provide education and appropriate resources to help participant work on and attain dietary goals.    Expected Outcomes Long Term: Adherence to nutrition and physical activity/exercise program aimed toward attainment of established weight goal;Weight Maintenance: Understanding of the daily nutrition guidelines, which includes 25-35% calories from fat, 7% or less cal from saturated fats, less than 200mg  cholesterol, less than 1.5gm of sodium, & 5 or more servings of fruits and vegetables daily;Understanding of distribution of calorie intake throughout the day with the consumption of 4-5 meals/snacks    Diabetes Yes     Intervention Provide education about signs/symptoms and action to take for hypo/hyperglycemia.;Provide education about proper nutrition, including hydration, and aerobic/resistive exercise prescription along with prescribed medications to achieve blood glucose in normal ranges: Fasting glucose 65-99 mg/dL    Expected Outcomes Short Term: Participant verbalizes understanding of the signs/symptoms and immediate care of hyper/hypoglycemia, proper foot care and importance of medication, aerobic/resistive exercise and nutrition plan for blood glucose control.;Long Term: Attainment of HbA1C < 7%.    Hypertension Yes    Intervention Provide education on lifestyle modifcations including regular physical activity/exercise, weight management, moderate sodium restriction and increased consumption of fresh fruit, vegetables, and low fat dairy, alcohol moderation, and smoking cessation.;Monitor prescription use compliance.    Expected Outcomes Short Term: Continued assessment and intervention until BP is < 140/75mm HG in hypertensive participants. < 130/52mm HG in hypertensive participants with diabetes, heart failure or chronic kidney disease.;Long Term: Maintenance of blood pressure at goal levels.    Lipids Yes    Intervention Provide education and support for participant on nutrition & aerobic/resistive exercise along with prescribed medications to achieve LDL 70mg , HDL >40mg .    Expected Outcomes Short Term: Participant states understanding of desired cholesterol values and is compliant with medications prescribed. Participant is  following exercise prescription and nutrition guidelines.;Long Term: Cholesterol controlled with medications as prescribed, with individualized exercise RX and with personalized nutrition plan. Value goals: LDL < 70mg , HDL > 40 mg.    Stress Yes    Intervention Offer individual and/or small group education and counseling on adjustment to heart disease, stress management and  health-related lifestyle change. Teach and support self-help strategies.;Refer participants experiencing significant psychosocial distress to appropriate mental health specialists for further evaluation and treatment. When possible, include family members and significant others in education/counseling sessions.    Expected Outcomes Short Term: Participant demonstrates changes in health-related behavior, relaxation and other stress management skills, ability to obtain effective social support, and compliance with psychotropic medications if prescribed.;Long Term: Emotional wellbeing is indicated by absence of clinically significant psychosocial distress or social isolation.    Personal Goal Other Yes    Personal Goal Be able to fully getback to work (involves heavy lifting).    Intervention Provide individualized exercise prescription including resistance training exercise that patient can do at home to help build strength.    Expected Outcomes Patient will be able to fully return to work activities.             Core Components/Risk Factors/Patient Goals Review:   Goals and Risk Factor Review     Row Name 07/11/22 1629 08/06/22 0838           Core Components/Risk Factors/Patient Goals Review   Personal Goals Review Weight Management/Obesity;Lipids;Diabetes;Stress;Hypertension Weight Management/Obesity;Lipids;Diabetes;Stress;Hypertension      Review [name] has been doing well with exercise at intensive cardiac rehab. vital signs have been stable. [name] does not check his CBg's at home as he does not have a meter [name] has been doing well with exercise at intensive cardiac rehab. vital signs have been stable. [name] will complete intensive cardiac rehab at the end of the month. [name] has missed some classes due to work obligations      Expected Outcomes [name] will continue to participate in intensive cardiac rehab for exercise, nutrition and lifestyle modifications [name] will continue to  participate in intensive cardiac rehab for exercise, nutrition and lifestyle modifications               Core Components/Risk Factors/Patient Goals at Discharge (Final Review):   Goals and Risk Factor Review - 08/06/22 0838       Core Components/Risk Factors/Patient Goals Review   Personal Goals Review Weight Management/Obesity;Lipids;Diabetes;Stress;Hypertension    Review [name] has been doing well with exercise at intensive cardiac rehab. vital signs have been stable. [name] will complete intensive cardiac rehab at the end of the month. [name] has missed some classes due to work obligations    Expected Outcomes [name] will continue to participate in intensive cardiac rehab for exercise, nutrition and lifestyle modifications             ITP Comments:  ITP Comments     Row Name 06/19/22 0825 06/25/22 1649 07/11/22 1622 08/06/22 0836     ITP Comments Medical Director- Dr. Armanda Magic, MD Introduction to Pritikin Education Program/ Intensive Cardiac Rehab. Initial Orientation Packet Reviewed with the patient 30 Day ITP Note. [name] started intensive cardiac rehab on 06/25/22 and did well with exercise 30 Day ITP Note. [name] has good attendance and participation in  intensive cardiac rehab 30 Day ITP Note. [name] has good  participation when in attendance  at  intensive cardiac rehab             Comments: See ITP  comments.Thayer Headings RN BSN

## 2022-08-08 ENCOUNTER — Encounter (HOSPITAL_COMMUNITY)
Admission: RE | Admit: 2022-08-08 | Discharge: 2022-08-08 | Disposition: A | Discharge status: Home / Self Care | Payer: PPO | Source: Ambulatory Visit | Attending: Interventional Cardiology | Admitting: Interventional Cardiology

## 2022-08-08 DIAGNOSIS — Z9861 Coronary angioplasty status: Secondary | ICD-10-CM

## 2022-08-08 DIAGNOSIS — I214 Non-ST elevation (NSTEMI) myocardial infarction: Secondary | ICD-10-CM

## 2022-08-09 ENCOUNTER — Telehealth (HOSPITAL_COMMUNITY): Payer: Self-pay

## 2022-08-09 NOTE — Telephone Encounter (Signed)
-----   Message from Corky Crafts, MD sent at 08/08/2022 11:12 PM EDT ----- Regarding: RE: Target Heart Rate Range Increase Request OK to increase target HR as noted below.   JV ----- Message ----- From: Harrie Jeans Sent: 08/08/2022   9:15 AM EDT To: Corky Crafts, MD Subject: Target Heart Rate Range Increase Request       CARDIAC REHAB PHASE 2  Good morning Dr. Eldridge Dace,  URIAS CAIN has been in rehab approximately 6 weeks and is doing well.  Blood pressure is within normal limits, but exercise heart rates are beginning to exceed Target Heart Rate Range(THRR) of 62-124 bpm (40%-80% of age predicted max HR).  If no GXT is planned for the near future and MD agrees, request to increase THR to 40% -90% of age predicted max HR 62-140 bpm.   Thanks for your assistance,  Harrie Jeans ACSM-CEP 08/08/2022 9:13 AM

## 2022-08-10 ENCOUNTER — Encounter (HOSPITAL_COMMUNITY)
Admission: RE | Admit: 2022-08-10 | Discharge: 2022-08-10 | Disposition: A | Discharge status: Home / Self Care | Payer: PPO | Source: Ambulatory Visit | Attending: Interventional Cardiology | Admitting: Interventional Cardiology

## 2022-08-10 DIAGNOSIS — I214 Non-ST elevation (NSTEMI) myocardial infarction: Secondary | ICD-10-CM | POA: Diagnosis not present

## 2022-08-10 DIAGNOSIS — Z9861 Coronary angioplasty status: Secondary | ICD-10-CM

## 2022-08-13 ENCOUNTER — Encounter (HOSPITAL_COMMUNITY): Payer: PPO

## 2022-08-13 ENCOUNTER — Encounter (HOSPITAL_BASED_OUTPATIENT_CLINIC_OR_DEPARTMENT_OTHER): Payer: Self-pay | Admitting: Emergency Medicine

## 2022-08-13 ENCOUNTER — Other Ambulatory Visit: Payer: Self-pay

## 2022-08-13 ENCOUNTER — Emergency Department (HOSPITAL_BASED_OUTPATIENT_CLINIC_OR_DEPARTMENT_OTHER)
Admission: EM | Admit: 2022-08-13 | Discharge: 2022-08-13 | Disposition: A | Discharge status: Home / Self Care | Payer: PPO | Attending: Emergency Medicine | Admitting: Emergency Medicine

## 2022-08-13 DIAGNOSIS — J029 Acute pharyngitis, unspecified: Secondary | ICD-10-CM | POA: Insufficient documentation

## 2022-08-13 DIAGNOSIS — Z7982 Long term (current) use of aspirin: Secondary | ICD-10-CM | POA: Diagnosis not present

## 2022-08-13 MED ORDER — DEXAMETHASONE 4 MG PO TABS
10.0000 mg | ORAL_TABLET | Freq: Once | ORAL | Status: AC
  Administered 2022-08-13: 10 mg via ORAL
  Filled 2022-08-13: qty 3

## 2022-08-13 MED ORDER — AMOXICILLIN 500 MG PO CAPS: 1000.0000 mg | ORAL_CAPSULE | Freq: Two times a day (BID) | ORAL | 0 refills | Status: DC

## 2022-08-13 NOTE — ED Triage Notes (Signed)
Pt arrives to ED with c/o sore throat that started yesterday. Was seen at [facility] prior, negative for strep throat.

## 2022-08-13 NOTE — ED Provider Notes (Signed)
[place] EMERGENC[facility] DEPARTMENT AT [facility] Provider Note   CSN: [id number] Arrival date & time: 08/13/22  1042     History  Chief Complaint  Patient presents with   Sore Throat    [name] is a 67 y.o. male.  67 yo M with a chief complaint of a sore throat.  Going on since yesterday.  Went to urgent care and had a rapid strep test was negative.  Since then he feels like it has persisted.  Denies cough or congestion.  Feels like it is painful to swallow.  No known sick contacts.   Sore Throat       Home Medications Prior to Admission medications   Medication Sig Start Date End Date Taking? Authorizing Provider  amoxicillin (AMOXIL) 500 MG capsule Take 2 capsules (1,000 mg total) by mouth 2 (two) times daily. 08/13/22  Yes Melene Plan, DO  acetaminophen (TYLENOL) 325 MG tablet Take 2 tablets (650 mg total) by mouth every 4 (four) hours as needed for headache or mild pain. 05/24/22   Marguerita Merles Latif, DO  Ascorbic Acid (VITAMIN C) 1000 MG tablet Take 1,000 mg by mouth daily.    [provider]  aspirin 81 MG chewable tablet Chew 1 tablet (81 mg total) by mouth daily. 05/25/22   Marguerita Merles Latif, DO  cholecalciferol (VITAMIN D) 1000 UNITS tablet Take 5,000 Units by mouth daily.     [provider]  Coenzyme Q10 (COQ10) 100 MG CAPS Take 100 mg by mouth daily. Per patient taking 200 mg    [provider]  cyclobenzaprine (FLEXERIL) 10 MG tablet Take 10 mg by mouth at bedtime as needed.    [provider]  darifenacin (ENABLEX) 15 MG 24 hr tablet Take 15 mg by mouth daily.     [provider]  DEXILANT 60 MG capsule TAKE 1 CAPSULE BY MOUTH ONCE DAILY 01/03/18   Dorothyann Peng, MD  ezetimibe (ZETIA) 10 MG tablet Take 10 mg by mouth daily. 01/16/22   [provider]  famotidine (PEPCID) 20 MG tablet Take 20 mg by mouth 2 (two) times daily. 05/22/22 05/22/23  [provider]  hydrOXYzine  (ATARAX/VISTARIL) 25 MG tablet Take 1 tablet (25 mg total) by mouth 2 (two) times daily. 12/05/17   Dorothyann Peng, MD  isosorbide mononitrate (IMDUR) 60 MG 24 hr tablet TAKE 1 TABLET BY MOUTH ONCE DAILY 05/27/17   Lyn Records, MD  meloxicam (MOBIC) 15 MG tablet Take 1 tablet by mouth as needed. 03/14/22   [provider]  metoprolol succinate (TOPROL-XL) 50 MG 24 hr tablet Take 1.5 tablets (75 mg total) by mouth daily. Take with or immediately following a meal. 06/07/22   Sharlene Dory, PA-C  MYRBETRIQ 50 MG TB24 tablet Take 50 mg by mouth daily. 12/25/21   [provider]  nilotinib (TASIGNA) 200 MG capsule Take 400 mg by mouth daily. Per patient taking 200 mg daily 09/22/21 09/22/22  [provider]  nitroGLYCERIN (NITROSTAT) 0.4 MG SL tablet Place 1 tablet (0.4 mg total) under the tongue every 5 (five) minutes as needed for chest pain. 05/24/22   Sheikh, Omair Latif, DO  olmesartan (BENICAR) 20 MG tablet Take 2 tablets (40 mg total) by mouth daily. 06/07/22   Sharlene Dory, PA-C  Omega-3 Fatty Acids (FISH OIL PO) Take by mouth.    [provider]  OZEMPIC, 0.25 OR 0.5 MG/DOSE, 2 MG/3ML SOPN Inject 0.5 mg into the skin once a  week. 12/13/21   [provider]  rosuvastatin (CRESTOR) 40 MG tablet Take 1 tablet (40 mg total) by mouth daily. 06/21/22   Corky Crafts, MD  ticagrelor (BRILINTA) 90 MG TABS tablet Take 1 tablet (90 mg total) by mouth 2 (two) times daily. Patient taking differently: Take 90 mg by mouth 2 (two) times daily. Per patient taking once daily. Will start taking twice a day 05/24/22   Marguerita Merles Latif, DO  VITAMIN E PO Take 1 tablet by mouth daily.    [provider]      Allergies    Contrast media [iodinated contrast media]    Review of Systems   Review of Systems  Physical Exam Updated Vital Signs BP 113/87 (BP Location: Right Arm)   Pulse 81   Temp 97.6 F (36.4 C) (Temporal)   Resp 16   Ht 5\' 7"  (1.702 m)    Wt 76.2 kg   SpO2 99%   BMI 26.31 kg/m  Physical Exam Vitals and nursing note reviewed.  Constitutional:      Appearance: He is well-developed.  HENT:     Head: Normocephalic and atraumatic.     Mouth/Throat:     Comments: Posterior oropharyngeal erythema.  No significant edema.  No tonsillar swelling or exudates.  Uvula is midline.  No sublingual swelling.  Able to rotate his head without discomfort.  Tolerating secretions without difficulty. Eyes:     Pupils: Pupils are equal, round, and reactive to light.  Neck:     Vascular: No JVD.  Cardiovascular:     Rate and Rhythm: Normal rate and regular rhythm.     Heart sounds: No murmur heard.    No friction rub. No gallop.  Pulmonary:     Effort: No respiratory distress.     Breath sounds: No wheezing.  Abdominal:     General: There is no distension.     Tenderness: There is no abdominal tenderness. There is no guarding or rebound.  Musculoskeletal:        General: Normal range of motion.     Cervical back: Normal range of motion and neck supple.  Skin:    Coloration: Skin is not pale.     Findings: No rash.  Neurological:     Mental Status: He is alert and oriented to person, place, and time.  Psychiatric:        Behavior: Behavior normal.     ED Results / Procedures / Treatments   Labs (all labs ordered are listed, but only abnormal results are displayed) Labs Reviewed - No data to display  EKG None  Radiology No results found.  Procedures Procedures    Medications Ordered in ED Medications  dexamethasone (DECADRON) tablet 10 mg (10 mg Oral Given 08/13/22 1120)    ED Course/ Medical Decision Making/ A&P                             Medical Decision Making Risk Prescription drug management.   67 yo M with a chief complaint of a sore throat.  He tells me that hurts in the back of his throat and is actually physically touched the back of his throat multiple times and thinks that that is where it hurts.   He does have some significant erythema to the posterior oropharynx.  No obvious signs of peritonsillar abscess or retropharyngeal abscess or epiglottitis.  He is well-appearing nontoxic.  Tolerating secretions out difficulty  here.  Will give a dose of steroids here.  Try watchful waiting with oral antibiotics.  11:29 AM:  I have discussed the diagnosis/risks/treatment options with the patient.  Evaluation and diagnostic testing in the emergency department does not suggest an emergent condition requiring admission or immediate intervention beyond what has been performed at this time.  They will follow up with PCP. We also discussed returning to the ED immediately if new or worsening sx occur. We discussed the sx which are most concerning (e.g., sudden worsening pain, fever, inability to tolerate by mouth) that necessitate immediate return. Medications administered to the patient during their visit and any new prescriptions provided to the patient are listed below.  Medications given during this visit Medications  dexamethasone (DECADRON) tablet 10 mg (10 mg Oral Given 08/13/22 1120)     The patient appears reasonably screen and/or stabilized for discharge and I doubt any other medical condition or other [facility] requiring further screening, evaluation, or treatment in the ED at this time prior to discharge.          Final Clinical Impression(s) / ED Diagnoses Final diagnoses:  Sore throat    Rx / DC Orders ED Discharge Orders          Ordered    amoxicillin (AMOXIL) 500 MG capsule  2 times daily        08/13/22 1113              Melene Plan, DO 08/13/22 1129

## 2022-08-13 NOTE — Discharge Instructions (Signed)
Your symptoms are most likely caused by a virus.  I did prescribe you antibiotics.  If you can hold off for couple days and see if your symptoms improve if your symptoms get worse and you can start taking the antibiotic.  Please follow-up with your family doctor in the office.  Please return to the emergency department for inability to swallow sudden worsening pain.

## 2022-08-15 ENCOUNTER — Encounter (HOSPITAL_COMMUNITY)
Admission: RE | Admit: 2022-08-15 | Discharge: 2022-08-15 | Disposition: A | Discharge status: Home / Self Care | Payer: PPO | Source: Ambulatory Visit | Attending: Interventional Cardiology | Admitting: Interventional Cardiology

## 2022-08-15 DIAGNOSIS — I214 Non-ST elevation (NSTEMI) myocardial infarction: Secondary | ICD-10-CM | POA: Diagnosis not present

## 2022-08-15 DIAGNOSIS — Z9861 Coronary angioplasty status: Secondary | ICD-10-CM

## 2022-08-17 ENCOUNTER — Encounter (HOSPITAL_COMMUNITY): Payer: PPO

## 2022-08-20 ENCOUNTER — Encounter (HOSPITAL_COMMUNITY)
Admission: RE | Admit: 2022-08-20 | Discharge: 2022-08-20 | Disposition: A | Discharge status: Home / Self Care | Payer: PPO | Source: Ambulatory Visit | Attending: Interventional Cardiology | Admitting: Interventional Cardiology

## 2022-08-20 DIAGNOSIS — I214 Non-ST elevation (NSTEMI) myocardial infarction: Secondary | ICD-10-CM

## 2022-08-20 DIAGNOSIS — Z9861 Coronary angioplasty status: Secondary | ICD-10-CM

## 2022-08-22 ENCOUNTER — Encounter (HOSPITAL_COMMUNITY): Payer: PPO

## 2022-08-24 ENCOUNTER — Encounter (HOSPITAL_COMMUNITY)
Admission: RE | Admit: 2022-08-24 | Discharge: 2022-08-24 | Disposition: A | Discharge status: Home / Self Care | Payer: PPO | Source: Ambulatory Visit | Attending: Interventional Cardiology | Admitting: Interventional Cardiology

## 2022-08-24 DIAGNOSIS — I214 Non-ST elevation (NSTEMI) myocardial infarction: Secondary | ICD-10-CM | POA: Diagnosis not present

## 2022-08-24 DIAGNOSIS — Z9861 Coronary angioplasty status: Secondary | ICD-10-CM

## 2022-08-27 ENCOUNTER — Encounter (HOSPITAL_COMMUNITY): Payer: PPO

## 2022-08-29 ENCOUNTER — Encounter (HOSPITAL_COMMUNITY)
Admission: RE | Admit: 2022-08-29 | Discharge: 2022-08-29 | Disposition: A | Discharge status: Home / Self Care | Payer: PPO | Source: Ambulatory Visit | Attending: Interventional Cardiology | Admitting: Interventional Cardiology

## 2022-08-29 DIAGNOSIS — I214 Non-ST elevation (NSTEMI) myocardial infarction: Secondary | ICD-10-CM | POA: Diagnosis not present

## 2022-08-29 DIAGNOSIS — Z9861 Coronary angioplasty status: Secondary | ICD-10-CM

## 2022-08-29 LAB — GLUCOSE, CAPILLARY
Glucose-Capillary: 163 mg/dL — ABNORMAL HIGH (ref 70–99)
Glucose-Capillary: 115 mg/dL — ABNORMAL HIGH (ref 70–99)

## 2022-08-31 ENCOUNTER — Telehealth (HOSPITAL_COMMUNITY): Payer: Self-pay

## 2022-08-31 ENCOUNTER — Encounter (HOSPITAL_COMMUNITY)
Admission: RE | Admit: 2022-08-31 | Discharge: 2022-08-31 | Disposition: A | Discharge status: Home / Self Care | Payer: PPO | Source: Ambulatory Visit | Attending: Interventional Cardiology | Admitting: Interventional Cardiology

## 2022-08-31 DIAGNOSIS — I214 Non-ST elevation (NSTEMI) myocardial infarction: Secondary | ICD-10-CM | POA: Diagnosis not present

## 2022-08-31 DIAGNOSIS — Z9861 Coronary angioplasty status: Secondary | ICD-10-CM

## 2022-08-31 NOTE — Telephone Encounter (Signed)
-----   Message from Corky Crafts, MD sent at 08/30/2022  5:31 PM EDT ----- Regarding: RE: Jog Request [facility],  He can jog if he feels up to it.  Thanks. JV ----- Message ----- From: Harrie Jeans Sent: 08/30/2022   7:18 AM EDT To: Corky Crafts, MD Subject: Jog Request                                    CARDIAC REHAB PHASE 2  Good morning Dr. Eldridge Dace,  Your patient [name] has been in the cardiac rehabilitation program approximately 8 weeks and is doing well. Patient would like to begin jogging in intervals. His current average MET level is 6.82. Please indicate if you are agreeable to this change in exercise prescription.  We appreciate your assistance and referral of your patient to our program. Harrie Jeans ACSM-CEP 08/30/2022 7:16 AM

## 2022-09-03 ENCOUNTER — Encounter (HOSPITAL_COMMUNITY)
Admission: RE | Admit: 2022-09-03 | Discharge: 2022-09-03 | Disposition: A | Discharge status: Home / Self Care | Payer: PPO | Source: Ambulatory Visit | Attending: Interventional Cardiology | Admitting: Interventional Cardiology

## 2022-09-03 DIAGNOSIS — I214 Non-ST elevation (NSTEMI) myocardial infarction: Secondary | ICD-10-CM | POA: Insufficient documentation

## 2022-09-03 DIAGNOSIS — Z9861 Coronary angioplasty status: Secondary | ICD-10-CM | POA: Diagnosis present

## 2022-09-03 NOTE — Progress Notes (Signed)
Cardiac Individual Treatment Plan  Patient Details  Name: [name] MRN: [id number] Date of Birth: [birth year] Referring Provider:   Flowsheet Row INTENSIVE CARDIAC REHAB ORIENT from 06/19/2022 in [facility]  Referring Provider Corky Crafts, MD       Initial Encounter Date:  Flowsheet Row INTENSIVE CARDIAC REHAB ORIENT from 06/19/2022 in [facility]  Date 06/19/22       Visit Diagnosis: 05/23/22 NSTEMI  05/23/22 S/P PTCA of of CFX  Patient's Home Medications on Admission:  Current Outpatient Medications:    acetaminophen (TYLENOL) 325 MG tablet, Take 2 tablets (650 mg total) by mouth every 4 (four) hours as needed for headache or mild pain., Disp: 20 tablet, Rfl: 0   amoxicillin (AMOXIL) 500 MG capsule, Take 2 capsules (1,000 mg total) by mouth 2 (two) times daily., Disp: 40 capsule, Rfl: 0   Ascorbic Acid (VITAMIN C) 1000 MG tablet, Take 1,000 mg by mouth daily., Disp: , Rfl:    aspirin 81 MG chewable tablet, Chew 1 tablet (81 mg total) by mouth daily., Disp: 30 tablet, Rfl: 0   cholecalciferol (VITAMIN D) 1000 UNITS tablet, Take 5,000 Units by mouth daily. , Disp: , Rfl:    Coenzyme Q10 (COQ10) 100 MG CAPS, Take 100 mg by mouth daily. Per patient taking 200 mg, Disp: , Rfl:    cyclobenzaprine (FLEXERIL) 10 MG tablet, Take 10 mg by mouth at bedtime as needed., Disp: , Rfl:    darifenacin (ENABLEX) 15 MG 24 hr tablet, Take 15 mg by mouth daily. , Disp: , Rfl:    DEXILANT 60 MG capsule, TAKE 1 CAPSULE BY MOUTH ONCE DAILY, Disp: 90 capsule, Rfl: 1   ezetimibe (ZETIA) 10 MG tablet, Take 10 mg by mouth daily., Disp: , Rfl:    famotidine (PEPCID) 20 MG tablet, Take 20 mg by mouth 2 (two) times daily. (Patient not taking: Reported on 09/03/2022), Disp: , Rfl:    hydrOXYzine (ATARAX/VISTARIL) 25 MG tablet, Take 1 tablet (25 mg total) by mouth 2 (two) times  daily., Disp: 90 tablet, Rfl: 0   isosorbide mononitrate (IMDUR) 60 MG 24 hr tablet, TAKE 1 TABLET BY MOUTH ONCE DAILY, Disp: 90 tablet, Rfl: 3   meloxicam (MOBIC) 15 MG tablet, Take 1 tablet by mouth as needed., Disp: , Rfl:    metoprolol succinate (TOPROL-XL) 50 MG 24 hr tablet, Take 1.5 tablets (75 mg total) by mouth daily. Take with or immediately following a meal., Disp: 135 tablet, Rfl: 2   MYRBETRIQ 50 MG TB24 tablet, Take 50 mg by mouth daily., Disp: , Rfl:    nilotinib (TASIGNA) 200 MG capsule, Take 400 mg by mouth daily. Per patient taking 200 mg daily, Disp: , Rfl:    nitroGLYCERIN (NITROSTAT) 0.4 MG SL tablet, Place 1 tablet (0.4 mg total) under the tongue every 5 (five) minutes as needed for chest pain., Disp: 25 tablet, Rfl: 12   olmesartan (BENICAR) 20 MG tablet, Take 2 tablets (40 mg total) by mouth daily., Disp: 90 tablet, Rfl: 3   Omega-3 Fatty Acids (FISH OIL PO), Take by mouth., Disp: , Rfl:    OZEMPIC, 0.25 OR 0.5 MG/DOSE, 2 MG/3ML SOPN, Inject 0.5 mg into the skin every 14 (fourteen) days. Pt stated PCP changed ozempic to every 2 weeks., Disp: , Rfl:    rosuvastatin (CRESTOR) 40 MG tablet, Take 1 tablet (40 mg total) by mouth daily.,  Disp: 90 tablet, Rfl: 3   ticagrelor (BRILINTA) 90 MG TABS tablet, Take 1 tablet (90 mg total) by mouth 2 (two) times daily. (Patient taking differently: Take 90 mg by mouth 2 (two) times daily. Per patient taking once daily. Will start taking twice a day), Disp: 60 tablet, Rfl: 0   VITAMIN E PO, Take 1 tablet by mouth daily., Disp: , Rfl:   Past Medical History: Past Medical History:  Diagnosis Date   Arthritis    Bladder disorder    Cancer (HCC)    hx prostate cancer and CML   Chronic myelocytic leukemia (HCC)    Coronary artery disease    with prior MI 2002([facility]) and Cfx(2006) and RCA (2004) stents . EF 45-50%   Coronary atherosclerosis of native coronary artery    stable and suspect a component of CAS   Diverticulosis    seen on  colonoscopy in 2008   Goals of care, counseling/discussion 09/13/2016   H/O colonoscopy 2014   Hyperlipidemia    Hypertension    Myocardial infarction (HCC) 02,6/12   Prostate cancer (HCC)     Tobacco Use: Social History   Tobacco Use  Smoking Status Former   Types: Cigarettes   Quit date: 06/06/2000   Years since quitting: 22.2  Smokeless Tobacco Never  Tobacco Comments   2002    Labs: Review Flowsheet  More data exists      Latest Ref Rng & Units 07/22/2015 10/18/2015 05/28/2016 12/19/2017 05/24/2022  Labs for ITP Cardiac and Pulmonary Rehab  Cholestrol 0 - 200 mg/dL 409  811  914  782  956   LDL (calc) 0 - 99 mg/dL 213  78  92  97  58   HDL-C >40 mg/dL 44  40  42  51  38   Trlycerides <150 mg/dL 76  53  086  67  38   Hemoglobin A1c 4.8 - 5.6 % - - - 6.1  6.1     Capillary Blood Glucose: Lab Results  Component Value Date   GLUCAP 115 (H) 08/29/2022   GLUCAP 163 (H) 08/29/2022   GLUCAP 98 07/02/2022   GLUCAP 107 (H) 07/02/2022   GLUCAP 122 (H) 06/29/2022     Exercise Target Goals: Exercise Program Goal: Individual exercise prescription set using results from initial 6 min walk test and THRR while considering  patient's activity barriers and safety.   Exercise Prescription Goal: Initial exercise prescription builds to 30-45 minutes a day of aerobic activity, 2-3 days per week.  Home exercise guidelines will be given to patient during program as part of exercise prescription that the participant will acknowledge.  Activity Barriers & Risk Stratification:  Activity Barriers & Cardiac Risk Stratification - 06/19/22 0901       Activity Barriers & Cardiac Risk Stratification   Activity Barriers Arthritis;Other (comment);Neck/Spine Problems;Joint Problems    Comments Right ankle has 9 screws and a plate, snapped ligament in right arm and right finger- reattached, multiple ganglian cysts removed from right atm, hx fractured left wrist, ablation C3-C7 because of  neck/shoulder/arm pain, hx torn left hamstrin, bone removal right great toe. Really bad arthritis in both hands/fingers, limited flexibility both wrists.    Cardiac Risk Stratification High             6 Minute Walk:  6 Minute Walk     Row Name 06/19/22 0915         6 Minute Walk   Phase Initial     Distance  1418 feet     Walk Time 6 minutes     # of Rest Breaks 0     MPH 2.68     METS 3.25     RPE 7     Perceived Dyspnea  0     VO2 Peak 11.37     Symptoms No     Resting HR 95 bpm     Resting BP 118/78     Resting Oxygen Saturation  99 %     Exercise Oxygen Saturation  during 6 min walk 98 %     Max Ex. HR 102 bpm     Max Ex. BP 100/66     2 Minute Post BP 110/72              Oxygen Initial Assessment:   Oxygen Re-Evaluation:   Oxygen Discharge (Final Oxygen Re-Evaluation):   Initial Exercise Prescription:  Initial Exercise Prescription - 06/19/22 0900       Date of Initial Exercise RX and Referring Provider   Date 06/19/22    Referring Provider Corky Crafts, MD    Expected Discharge Date 08/31/22      Recumbant Bike   Level 2    Watts 30    Minutes 15    METs 3.2      NuStep   Level 2    SPM 85    Minutes 15    METs 2.5      Prescription Details   Frequency (times per week) 3    Duration Progress to 30 minutes of continuous aerobic without signs/symptoms of physical distress      Intensity   THRR 40-80% of Max Heartrate 62-124    Ratings of Perceived Exertion 11-13    Perceived Dyspnea 0-4      Progression   Progression Continue to progress workloads to maintain intensity without signs/symptoms of physical distress.      Resistance Training   Training Prescription Yes    Weight 4 lbs    Reps 10-15             Perform Capillary Blood Glucose checks as needed.  Exercise Prescription Changes:   Exercise Prescription Changes     Row Name 06/25/22 0654 07/11/22 0830 07/18/22 0912 08/06/22 0959 08/15/22 0842      Response to Exercise   Blood Pressure (Admit) 114/72 112/78 102/70 104/62 122/74   Blood Pressure (Exercise) 126/68 128/74 118/74 124/70 130/61   Blood Pressure (Exit) 104/66 104/70 118/78 100/64 110/68   Heart Rate (Admit) 87 bpm 86 bpm 84 bpm 98 bpm 84 bpm   Heart Rate (Exercise) 120 bpm 130 bpm 114 bpm 128 bpm 130 bpm   Heart Rate (Exit) 100 bpm 96 bpm 92 bpm 99 bpm 92 bpm   Rating of Perceived Exertion (Exercise) 9 10 10  11.5 11   Symptoms None None None None None   Comments Off to a great start with exercise. Reviewed MET's Reviewed goals and home ExRx REVD MET's REVD MET's and goals   Duration Continue with 30 min of aerobic exercise without signs/symptoms of physical distress. Continue with 30 min of aerobic exercise without signs/symptoms of physical distress. Continue with 30 min of aerobic exercise without signs/symptoms of physical distress. Continue with 30 min of aerobic exercise without signs/symptoms of physical distress. Continue with 30 min of aerobic exercise without signs/symptoms of physical distress.   Intensity THRR unchanged THRR unchanged THRR unchanged THRR unchanged THRR unchanged     Progression  Progression Continue to progress workloads to maintain intensity without signs/symptoms of physical distress. Continue to progress workloads to maintain intensity without signs/symptoms of physical distress. Continue to progress workloads to maintain intensity without signs/symptoms of physical distress. Continue to progress workloads to maintain intensity without signs/symptoms of physical distress. Continue to progress workloads to maintain intensity without signs/symptoms of physical distress.   Average METs 3 4.23 4.2 5.45 5.69     Resistance Training   Training Prescription Yes No No No No   Weight 4 lbs -- -- -- --   Reps 10-15 -- -- -- --   Time 10 Minutes -- -- -- --     Interval Training   Interval Training No No No No No     Recumbant Bike   Level 2.5 -- -- --  --   Minutes 15 -- -- -- --   METs 3.4 -- -- -- --     NuStep   Level 2 2 2 4 5    SPM 85 120 116 -- 108   Minutes 15 15 15 15 15    METs 2.6 3.1 2 2.9 3.3     Rower   Level -- 7 7 7 7    Watts -- 42 30 47 48   Minutes -- 15 15 15 15    METs -- 5.36 6.4 7.99 8.07     Home Exercise Plan   Plans to continue exercise at -- -- [employer] (comment) [employer] (comment) [employer] (comment)   Frequency -- -- Add 4 additional days to program exercise sessions. Add 4 additional days to program exercise sessions. Add 4 additional days to program exercise sessions.   Initial Home Exercises Provided -- -- 07/18/22 07/18/22 07/18/22    Row Name 08/29/22 0817             Response to Exercise   Blood Pressure (Admit) 112/70       Blood Pressure (Exercise) 138/70       Blood Pressure (Exit) 108/70       Heart Rate (Admit) 78 bpm       Heart Rate (Exercise) 150 bpm       Heart Rate (Exit) 85 bpm       Rating of Perceived Exertion (Exercise) 8       Symptoms None       Comments REVD MET's       Duration Continue with 30 min of aerobic exercise without signs/symptoms of physical distress.       Intensity THRR unchanged         Progression   Progression Continue to progress workloads to maintain intensity without signs/symptoms of physical distress.       Average METs 6.82         Resistance Training   Training Prescription No         Interval Training   Interval Training No         Elliptical   Level 1       Speed 1       Minutes 15       METs 6         Rower   Level 7       Watts 42       Minutes 15       METs 7.63         Home Exercise Plan   Plans to continue exercise at [employer] (comment)       Frequency Add  4 additional days to program exercise sessions.       Initial Home Exercises Provided 07/18/22                Exercise Comments:   Exercise Comments     Row Name 06/25/22 0834 07/11/22 0830 07/18/22 0919 08/06/22 1001  08/15/22 0849   Exercise Comments Pt completed first day of exercise. Discussed exercise prescription, RPE scale, and target heart rate range. Tevin exercised for 15 min on the Nustep and recumbent bike. He averaged 2.6 METs at level 2 on the Nustep and 3.4 METs at level 2.5 on the recumbent bike. He performed the warmup and cooldown standing without any limitations. Reviewed MET's with pt today. Pt tolerated exercise well with an average MET level of 4.23. Will continue to monitor pt and progress workloads as tolerated without sign or symptom Reviewed goals and home ExRx. Pt tolerated exercise well with an average MET level of 4.2. Pt will exercie on his own by going to stretch classes, [employer] and walking 5-6 days a week for 30-45 mins per session. Pt feels good about progress so far and gave some additional resouces for gradual weight training at the [facility] Reviewed MET's roday. Pt tolerated exercise well with an anverage MET level of 5.45. Pt is feeling good about the progress he's making and is increasing MET's Reviewed goals and MET's. Pt tolerated exercise well with an average MET level of 5.69. Pt is increasing MET's and is feeling good with exercise. Pt is doing well with his goals and is back at work and feeling good    Row Name 08/29/22 [phone]           Exercise Comments Reviewed MET's. Pt tolerated exercise well with an average MET level of 6.82. Pt is doing very well and feels good about his progress. He says he feels like he is able to do mre at home without fatigue and is getting his strength back                Exercise Goals and Review:   Exercise Goals     Row Name 06/19/22 0901 06/25/22 0825           Exercise Goals   Increase Physical Activity Yes Yes      Intervention Provide advice, education, support and counseling about physical activity/exercise needs.;Develop an individualized exercise prescription for aerobic and resistive training based on initial evaluation  findings, risk stratification, comorbidities and participant's personal goals. Provide advice, education, support and counseling about physical activity/exercise needs.;Develop an individualized exercise prescription for aerobic and resistive training based on initial evaluation findings, risk stratification, comorbidities and participant's personal goals.      Expected Outcomes Short Term: Attend rehab on a regular basis to increase amount of physical activity.;Long Term: Add in home exercise to make exercise part of routine and to increase amount of physical activity.;Long Term: Exercising regularly at least 3-5 days a week. Short Term: Attend rehab on a regular basis to increase amount of physical activity.;Long Term: Add in home exercise to make exercise part of routine and to increase amount of physical activity.;Long Term: Exercising regularly at least 3-5 days a week.      Increase Strength and Stamina Yes Yes      Intervention Provide advice, education, support and counseling about physical activity/exercise needs.;Develop an individualized exercise prescription for aerobic and resistive training based on initial evaluation findings, risk stratification, comorbidities and participant's personal goals. Provide advice, education, support and counseling  about physical activity/exercise needs.;Develop an individualized exercise prescription for aerobic and resistive training based on initial evaluation findings, risk stratification, comorbidities and participant's personal goals.      Expected Outcomes Short Term: Increase workloads from initial exercise prescription for resistance, speed, and METs.;Short Term: Perform resistance training exercises routinely during rehab and add in resistance training at home;Long Term: Improve cardiorespiratory fitness, muscular endurance and strength as measured by increased METs and functional capacity ( ) Short Term: Increase workloads from initial exercise  prescription for resistance, speed, and METs.;Short Term: Perform resistance training exercises routinely during rehab and add in resistance training at home;Long Term: Improve cardiorespiratory fitness, muscular endurance and strength as measured by increased METs and functional capacity ( )      Able to understand and use rate of perceived exertion (RPE) scale Yes Yes      Intervention Provide education and explanation on how to use RPE scale Provide education and explanation on how to use RPE scale      Expected Outcomes Short Term: Able to use RPE daily in rehab to express subjective intensity level;Long Term:  Able to use RPE to guide intensity level when exercising independently Short Term: Able to use RPE daily in rehab to express subjective intensity level;Long Term:  Able to use RPE to guide intensity level when exercising independently      Knowledge and understanding of Target Heart Rate Range (THRR) Yes Yes      Intervention Provide education and explanation of THRR including how the numbers were predicted and where they are located for reference Provide education and explanation of THRR including how the numbers were predicted and where they are located for reference      Expected Outcomes Short Term: Able to state/look up THRR;Long Term: Able to use THRR to govern intensity when exercising independently;Short Term: Able to use daily as guideline for intensity in rehab Short Term: Able to state/look up THRR;Long Term: Able to use THRR to govern intensity when exercising independently;Short Term: Able to use daily as guideline for intensity in rehab      Able to check pulse independently Yes Yes      Intervention Provide education and demonstration on how to check pulse in carotid and radial arteries.;Review the importance of being able to check your own pulse for safety during independent exercise Provide education and demonstration on how to check pulse in carotid and radial arteries.;Review  the importance of being able to check your own pulse for safety during independent exercise      Expected Outcomes Short Term: Able to explain why pulse checking is important during independent exercise;Long Term: Able to check pulse independently and accurately Short Term: Able to explain why pulse checking is important during independent exercise;Long Term: Able to check pulse independently and accurately      Understanding of Exercise Prescription Yes Yes      Intervention Provide education, explanation, and written materials on patient's individual exercise prescription Provide education, explanation, and written materials on patient's individual exercise prescription      Expected Outcomes Short Term: Able to explain program exercise prescription;Long Term: Able to explain home exercise prescription to exercise independently Short Term: Able to explain program exercise prescription;Long Term: Able to explain home exercise prescription to exercise independently               Exercise Goals Re-Evaluation :  Exercise Goals Re-Evaluation     Row Name 06/25/22 [phone] 06/25/22 0825 07/18/22 2130 08/15/22 8657  Exercise Goal Re-Evaluation   Exercise Goals Review Increase Physical Activity;Understanding of Exercise Prescription;Increase Strength and Stamina;Knowledge and understanding of Target Heart Rate Range (THRR);Able to check pulse independently;Able to understand and use rate of perceived exertion (RPE) scale Increase Physical Activity;Understanding of Exercise Prescription;Increase Strength and Stamina;Knowledge and understanding of Target Heart Rate Range (THRR);Able to check pulse independently;Able to understand and use rate of perceived exertion (RPE) scale Increase Physical Activity;Understanding of Exercise Prescription;Increase Strength and Stamina;Knowledge and understanding of Target Heart Rate Range (THRR);Able to check pulse independently;Able to understand and use rate of  perceived exertion (RPE) scale Increase Physical Activity;Understanding of Exercise Prescription;Increase Strength and Stamina;Knowledge and understanding of Target Heart Rate Range (THRR);Able to check pulse independently;Able to understand and use rate of perceived exertion (RPE) scale    Comments Pt completed first day of exercise. Discussed exercise prescription, RPE scale, and target heart rate range. Alyaan exercised for 15 min on the Nustep and recumbent bike. He averaged 2.6 METs at level 2 on the Nustep and 3.4 METs  at level 2.5 on the recumbent bike. He performed the warmup and cooldown standing without any limitations. Pt completed first day of exercise. Discussed exercise prescription, RPE scale, and target heart rate range. Niccolo exercised for 15 min on the Nustep and recumbent bike. He averaged 2.6 METs at level 2 on the Nustep and 3.4 METs  at level 2.5 on the recumbent bike. He performed the warmup and cooldown standing without any limitations. Reviewed goals and home ExRx. Pt tolerated exercise well with an average MET level of 4.2. Pt will exercie on his own by going to stretch classes, [employer] and walking 5-6 days a week for 30-45 mins per session. Pt feels good about progress so far and gave some additional resouces for gradual weight training at the [facility] Reviewed goals and MET's. Pt tolerated exercise well with an average MET level of 5.69. Pt is increasing MET's and is feeling good with exercise. Pt is doing well with his goals and is back at work and feeling good    Expected Outcomes Will continue to monitor pt and progress workloads as tolerated without sign or symtpom Will continue to monitor pt and progress workloads as tolerated without sign or symtpom Will continue to monitor pt and progress workloads as tolerated without sign or symtpom Will continue to monitor pt and progress workloads as tolerated without sign or symtpom             Discharge Exercise Prescription (Final  Exercise Prescription Changes):  Exercise Prescription Changes - 08/29/22 0817       Response to Exercise   Blood Pressure (Admit) 112/70    Blood Pressure (Exercise) 138/70    Blood Pressure (Exit) 108/70    Heart Rate (Admit) 78 bpm    Heart Rate (Exercise) 150 bpm    Heart Rate (Exit) 85 bpm    Rating of Perceived Exertion (Exercise) 8    Symptoms None    Comments REVD MET's    Duration Continue with 30 min of aerobic exercise without signs/symptoms of physical distress.    Intensity THRR unchanged      Progression   Progression Continue to progress workloads to maintain intensity without signs/symptoms of physical distress.    Average METs 6.82      Resistance Training   Training Prescription No      Interval Training   Interval Training No      Elliptical   Level 1    Speed 1  Minutes 15    METs 6      Rower   Level 7    Watts 42    Minutes 15    METs 7.63      Home Exercise Plan   Plans to continue exercise at [employer] (comment)    Frequency Add 4 additional days to program exercise sessions.    Initial Home Exercises Provided 07/18/22             Nutrition:  Target Goals: Understanding of nutrition guidelines, daily intake of sodium 1500mg , cholesterol 200mg , calories 30% from fat and 7% or less from saturated fats, daily to have 5 or more servings of fruits and vegetables.  Biometrics:  Pre Biometrics - 06/19/22 0825       Pre Biometrics   Waist Circumference 36.75 inches    Hip Circumference 38 inches    Waist to Hip Ratio 0.97 %    Triceps Skinfold 13 mm    % Body Fat 24.8 %    Grip Strength 28 kg    Flexibility 14 in    Single Leg Stand 30 seconds              Nutrition Therapy Plan and Nutrition Goals:  Nutrition Therapy & Goals - 08/24/22 0955       Nutrition Therapy   Diet Heart Healthy Diet    Drug/Food Interactions Statins/Certain Fruits      Personal Nutrition Goals   Nutrition Goal Patient to identify  strategies for reducing cardiovascular risk by attending the [employer] education and nutrition series weekly.    Personal Goal #2 Patient to improve diet quality by using the plate method as a guide for meal planning to include lean protein/plant protein, fruits, vegetables, whole grains, nonfat dairy as part of a well-balanced diet.    Comments Trystan does not attend the [employer] education and nutrition series. He continues Ozempic to aid with weightloss/blood sugar control; he is down ~7.7# since starting with our program. Elige will benefit from participation in intensive cardiac rehab for nutrition, exercise, and lifestyle modification.      Intervention Plan   Intervention Prescribe, educate and counsel regarding individualized specific dietary modifications aiming towards targeted core components such as weight, hypertension, lipid management, diabetes, heart failure and other comorbidities.;Nutrition handout(s) given to patient.    Expected Outcomes Short Term Goal: Understand basic principles of dietary content, such as calories, fat, sodium, cholesterol and nutrients.;Long Term Goal: Adherence to prescribed nutrition plan.             Nutrition Assessments:  MEDIFICTS Score Key: ?70 Need to make dietary changes  40-70 Heart Healthy Diet ? 40 Therapeutic Level Cholesterol Diet    Picture Your Plate Scores: <40 Unhealthy dietary pattern with much room for improvement. 41-50 Dietary pattern unlikely to meet recommendations for good health and room for improvement. 51-60 More healthful dietary pattern, with some room for improvement.  >60 Healthy dietary pattern, although there may be some specific behaviors that could be improved.    Nutrition Goals Re-Evaluation:  Nutrition Goals Re-Evaluation     Row Name 06/25/22 0859 07/23/22 0906 08/24/22 0955         Goals   Current Weight 174 lb 9.7 oz (79.2 kg) 170 lb 6.7 oz (77.3 kg) 165 lb 9.1 oz (75.1 kg)     Comment  LipoproteinA 305, A1c 6.1 (taking ozempic), HDL 38 No new labs; most recent labs LipoproteinA 305, A1c 6.1 (taking ozempic), HDL 38 No new  labs; most recent labs LipoproteinA 305, A1c 6.1 (taking ozempic), HDL 38     Expected Outcome Shawon will benefit from participation in intensive cardiac rehab for nutrition, exercise, and lifestyle modification. Jaysen does not attend the Pritikin education and nutrition series. He continues Ozempic to aid with weightloss; he is down ~3# since starting with our program. Kathy will benefit from participation in intensive cardiac rehab for nutrition, exercise, and lifestyle modification. Roy does not attend the Pritikin education and nutrition series. He continues Ozempic to aid with weightloss/blood sugar control; he is down ~7.7# since starting with our program. Cordelle will benefit from participation in intensive cardiac rehab for nutrition, exercise, and lifestyle modification.              Nutrition Goals Re-Evaluation:  Nutrition Goals Re-Evaluation     Row Name 06/25/22 0859 07/23/22 0906 08/24/22 0955         Goals   Current Weight 174 lb 9.7 oz (79.2 kg) 170 lb 6.7 oz (77.3 kg) 165 lb 9.1 oz (75.1 kg)     Comment LipoproteinA 305, A1c 6.1 (taking ozempic), HDL 38 No new labs; most recent labs LipoproteinA 305, A1c 6.1 (taking ozempic), HDL 38 No new labs; most recent labs LipoproteinA 305, A1c 6.1 (taking ozempic), HDL 38     Expected Outcome Casmier will benefit from participation in intensive cardiac rehab for nutrition, exercise, and lifestyle modification. Cordie does not attend the P[facility]in education and nutrition series. He continues Ozempic to aid with weightloss; he is down ~3# since starting with our program. Phoenixx will benefit from participation in intensive cardiac rehab for nutrition, exercise, and lifestyle modification. Lory does not attend the Pritikin education and nutrition series. He continues Ozempic to aid  with weightloss/blood sugar control; he is down ~7.7# since starting with our program. Ernesto will benefit from participation in intensive cardiac rehab for nutrition, exercise, and lifestyle modification.              Nutrition Goals Discharge (Final Nutrition Goals Re-Evaluation):  Nutrition Goals Re-Evaluation - 08/24/22 0955       Goals   Current Weight 165 lb 9.1 oz (75.1 kg)    Comment No new labs; most recent labs LipoproteinA 305, A1c 6.1 (taking ozempic), HDL 38    Expected Outcome Heru does not attend the P[facility]in education and nutrition series. He continues Ozempic to aid with weightloss/blood sugar control; he is down ~7.7# since starting with our program. Bellamy will benefit from participation in intensive cardiac rehab for nutrition, exercise, and lifestyle modification.             Psychosocial: Target Goals: Acknowledge presence or absence of significant depression and/or stress, maximize coping skills, provide positive support system. Participant is able to verbalize types and ability to use techniques and skills needed for reducing stress and depression.  Initial Review & Psychosocial Screening:  Initial Psych Review & Screening - 09/03/22 1303       Initial Review   Source of Stress Concerns Family    Comments Will continue to monitor and offer support as needed             Quality of Life Scores:  Quality of Life - 06/19/22 1013       Quality of Life   Select Quality of Life      Quality of Life Scores   Health/Function Pre 23.03 %    Socioeconomic Pre 23.79 %    Psych/Spiritual Pre 20.71 %  Family Pre 21.6 %    GLOBAL Pre 22.5 %            Scores of 19 and below usually indicate a poorer quality of life in these areas.  A difference of  2-3 points is a clinically meaningful difference.  A difference of 2-3 points in the total score of the Quality of Life Index has been associated with significant improvement in overall quality  of life, self-image, physical symptoms, and general health in studies assessing change in quality of life.  PHQ-9: Review Flowsheet  More data exists      06/19/2022 12/19/2017 06/04/2016 02/01/2016 08/15/2015  Depression screen PHQ 2/9  Decreased Interest 0 0 0 0 0  Down, Depressed, Hopeless 0 0 0 0 0  PHQ - 2 Score 0 0 0 0 0  Altered sleeping 0 - - - -  Tired, decreased energy 0 - - - -  Change in appetite 1 - - - -  Feeling bad or failure about yourself  0 - - - -  Trouble concentrating 0 - - - -  Moving slowly or fidgety/restless 0 - - - -  Suicidal thoughts 0 - - - -  PHQ-9 Score 1 - - - -  Difficult doing work/chores Not difficult at all - - - -   Interpretation of Total Score  Total Score Depression Severity:  1-4 = Minimal depression, 5-9 = Mild depression, 10-14 = Moderate depression, 15-19 = Moderately severe depression, 20-27 = Severe depression   Psychosocial Evaluation and Intervention:   Psychosocial Re-Evaluation:  Psychosocial Re-Evaluation     Row Name 06/25/22 1654 07/11/22 1623 08/06/22 0837 08/31/22 0846       Psychosocial Re-Evaluation   Current issues with Current Stress Concerns Current Stress Concerns Current Stress Concerns Current Stress Concerns    Comments [name] did not voice any increased concerns or stressors on his first day of exercise [name] did not voice any increased concerns or stressors on his first day of exercise [name] has not voiced  any increased concerns or stressors at intensive cardiac rehab Better, making progress. Son who has been living with him is now in his own place as of Friday.    Interventions -- -- -- Encouraged to attend Cardiac Rehabilitation for the exercise    Continue Psychosocial Services  Follow up required by counselor No Follow up required No Follow up required No Follow up required      Initial Review   Source of Stress Concerns Family Family Family Family    Comments Will continue to monitor and offer support as  needed Will continue to monitor and offer support as needed Will continue to monitor and offer support as needed Will continue to monitor and offer support as needed             Psychosocial Discharge (Final Psychosocial Re-Evaluation):  Psychosocial Re-Evaluation - 08/31/22 0846       Psychosocial Re-Evaluation   Current issues with Current Stress Concerns    Comments Better, making progress. Son who has been living with him is now in his own place as of Friday.    Interventions Encouraged to attend Cardiac Rehabilitation for the exercise    Continue Psychosocial Services  No Follow up required      Initial Review   Source of Stress Concerns Family    Comments Will continue to monitor and offer support as needed             Vocational  Rehabilitation: Provide vocational rehab assistance to qualifying candidates.   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 Participant will state understanding/return demonstration of topics presented.    Education     Row Name 06/25/22 0800     Education   Cardiac Education Topics Pritikin   Select Core Videos     Core Videos   Educator Exercise Physiologist   Select Exercise Education   Exercise Education Biomechanial Limitations   Instruction Review Code 1- Verbalizes Understanding   Class Start Time [id number]   Class Stop Time 0855   Class Time Calculation (min) 41 min            Core Videos: Exercise    Move It!  Clinical staff conducted group or individual video education with verbal and written material and guidebook.  Patient learns the recommended  Pritikin exercise program. Exercise with the goal of living a long, healthy life. Some of the health benefits of exercise include controlled diabetes, healthier blood pressure levels, improved cholesterol levels, improved heart and lung capacity, improved sleep, and better body composition. Everyone should speak with their doctor before starting or changing an exercise routine.  Biomechanical Limitations Clinical staff conducted group or individual video education with verbal and written material and guidebook.  Patient learns how biomechanical limitations can impact exercise and how we can mitigate and possibly overcome limitations to have an impactful and balanced exercise routine.  Body Composition Clinical staff conducted group or individual video education with verbal and written material and guidebook.  Patient learns that body composition (ratio of muscle mass to fat mass) is a key component to assessing overall fitness, rather than body weight alone. Increased fat mass, especially visceral belly fat, can put [place] at increased risk for metabolic syndrome, type 2 diabetes, heart disease, and even death. It is recommended to combine diet and exercise (cardiovascular and resistance training) to improve your body composition. Seek guidance from your physician and exercise physiologist before implementing an exercise routine.  Exercise Action Plan Clinical staff conducted group or individual video education with verbal and written material and guidebook.  Patient learns the recommended strategies to achieve and enjoy long-term exercise adherence, including variety, self-motivation, self-efficacy, and positive decision making. Benefits of exercise include fitness, good health, weight management, more energy, better sleep, less stress, and overall well-being.  Medical   Heart Disease Risk Reduction Clinical staff conducted group or individual video education with verbal and written material and  guidebook.  Patient learns our heart is our most vital organ as it circulates oxygen, nutrients, white blood cells, and hormones throughout the entire body, and carries waste away. Data supports a plant-based eating plan like the Pritikin Program for its effectiveness in slowing progression of and reversing heart disease. The video provides a number of recommendations to address heart disease.   Metabolic Syndrome and Belly Fat  Clinical staff conducted group or individual video education with verbal and written material and guidebook.  Patient learns what metabolic syndrome is, how it leads to heart disease, and how one can reverse it and keep it from coming back. You  have metabolic syndrome if you have 3 of the following 5 criteria: abdominal obesity, high blood pressure, high triglycerides, low HDL cholesterol, and high blood sugar.  Hypertension and Heart Disease Clinical staff conducted group or individual video education with verbal and written material and guidebook.  Patient learns that high blood pressure, or hypertension, is very common in the [place]. Hypertension is largely due to excessive salt intake, but other important risk factors include being overweight, physical inactivity, drinking too much alcohol, smoking, and not eating enough potassium from fruits and vegetables. High blood pressure is a leading risk factor for heart attack, stroke, congestive heart failure, dementia, kidney failure, and premature death. Long-term effects of excessive salt intake include stiffening of the arteries and thickening of heart muscle and organ damage. Recommendations include ways to reduce hypertension and the risk of heart disease.  Diseases of Our Time - Focusing on Diabetes Clinical staff conducted group or individual video education with verbal and written material and guidebook.  Patient learns why the best way to stop diseases of our time is prevention, through food and other lifestyle  changes. Medicine (such as prescription pills and surgeries) is often only a Band-Aid on the problem, not a long-term solution. Most common diseases of our time include obesity, type 2 diabetes, hypertension, heart disease, and cancer. The Pritikin Program is recommended and has been proven to help reduce, reverse, and/or prevent the damaging effects of metabolic syndrome.  Nutrition   Overview of the Pritikin Eating Plan  Clinical staff conducted group or individual video education with verbal and written material and guidebook.  Patient learns about the Pritikin Eating Plan for disease risk reduction. The Pritikin Eating Plan emphasizes a wide variety of unrefined, minimally-processed carbohydrates, like fruits, vegetables, whole grains, and legumes. Go, Caution, and Stop food choices are explained. Plant-based and lean animal proteins are emphasized. Rationale provided for low sodium intake for blood pressure control, low added sugars for blood sugar stabilization, and low added fats and oils for coronary artery disease risk reduction and weight management.  Calorie Density  Clinical staff conducted group or individual video education with verbal and written material and guidebook.  Patient learns about calorie density and how it impacts the Pritikin Eating Plan. Knowing the characteristics of the food you choose will help you decide whether those foods will lead to weight gain or weight loss, and whether you want to consume more or less of them. Weight loss is usually a side effect of the Pritikin Eating Plan because of its focus on low calorie-dense foods.  Label Reading  Clinical staff conducted group or individual video education with verbal and written material and guidebook.  Patient learns about the Pritikin recommended label reading guidelines and corresponding recommendations regarding calorie density, added sugars, sodium content, and whole grains.  Dining Out - Part 1  Clinical staff  conducted group or individual video education with verbal and written material and guidebook.  Patient learns that restaurant meals can be sabotaging because they can be so high in calories, fat, sodium, and/or sugar. Patient learns recommended strategies on how to positively address this and avoid unhealthy pitfalls.  Facts on Fats  Clinical staff conducted group or individual video education with verbal and written material and guidebook.  Patient learns that lifestyle modifications can be just as effective, if not more so, as many medications for lowering your risk of heart disease. A Pritikin lifestyle can help to reduce your risk of inflammation and atherosclerosis (cholesterol build-up, or  plaque, in the artery walls). Lifestyle interventions such as dietary choices and physical activity address the cause of atherosclerosis. A review of the types of fats and their impact on blood cholesterol levels, along with dietary recommendations to reduce fat intake is also included.  Nutrition Action Plan  Clinical staff conducted group or individual video education with verbal and written material and guidebook.  Patient learns how to incorporate Pritikin recommendations into their lifestyle. Recommendations include planning and keeping personal health goals in mind as an important part of their success.  Healthy Mind-Set    Healthy Minds, Bodies, Hearts  Clinical staff conducted group or individual video education with verbal and written material and guidebook.  Patient learns how to identify when they are stressed. Video will discuss the impact of that stress, as well as the many benefits of stress management. Patient will also be introduced to stress management techniques. The way we think, act, and feel has an impact on our hearts.  How Our Thoughts Can Heal Our Hearts  Clinical staff conducted group or individual video education with verbal and written material and guidebook.  Patient learns that  negative thoughts can cause depression and anxiety. This can result in negative lifestyle behavior and serious health problems. Cognitive behavioral therapy is an effective method to help control our thoughts in order to change and improve our emotional outlook.  Additional Videos:  Exercise    Improving Performance  Clinical staff conducted group or individual video education with verbal and written material and guidebook.  Patient learns to use a non-linear approach by alternating intensity levels and lengths of time spent exercising to help burn more calories and lose more body fat. Cardiovascular exercise helps improve heart health, metabolism, hormonal balance, blood sugar control, and recovery from fatigue. Resistance training improves strength, endurance, balance, coordination, reaction time, metabolism, and muscle mass. Flexibility exercise improves circulation, posture, and balance. Seek guidance from your physician and exercise physiologist before implementing an exercise routine and learn your capabilities and proper form for all exercise.  Introduction to Yoga  Clinical staff conducted group or individual video education with verbal and written material and guidebook.  Patient learns about yoga, a discipline of the coming together of mind, breath, and body. The benefits of yoga include improved flexibility, improved range of motion, better posture and core strength, increased lung function, weight loss, and positive self-image. Yoga's heart health benefits include lowered blood pressure, healthier heart rate, decreased cholesterol and triglyceride levels, improved immune function, and reduced stress. Seek guidance from your physician and exercise physiologist before implementing an exercise routine and learn your capabilities and proper form for all exercise.  Medical   Aging: Enhancing Your Quality of Life  Clinical staff conducted group or individual video education with verbal and  written material and guidebook.  Patient learns key strategies and recommendations to stay in good physical health and enhance quality of life, such as prevention strategies, having an advocate, securing a Health Care Proxy and Power of Attorney, and keeping a list of medications and system for tracking them. It also discusses how to avoid risk for bone loss.  Biology of Weight Control  Clinical staff conducted group or individual video education with verbal and written material and guidebook.  Patient learns that weight gain occurs because we consume more calories than we burn (eating more, moving less). Even if your body weight is normal, you may have higher ratios of fat compared to muscle mass. Too much body fat puts you at  increased risk for cardiovascular disease, heart attack, stroke, type 2 diabetes, and obesity-related cancers. In addition to exercise, following the Pritikin Eating Plan can help reduce your risk.  Decoding Lab Results  Clinical staff conducted group or individual video education with verbal and written material and guidebook.  Patient learns that lab test reflects one measurement whose values change over time and are influenced by many factors, including medication, stress, sleep, exercise, food, hydration, pre-existing medical conditions, and more. It is recommended to use the knowledge from this video to become more involved with your lab results and evaluate your numbers to speak with your doctor.   Diseases of Our Time - Overview  Clinical staff conducted group or individual video education with verbal and written material and guidebook.  Patient learns that according to the CDC, 50% to 70% of chronic diseases (such as obesity, type 2 diabetes, elevated lipids, hypertension, and heart disease) are avoidable through lifestyle improvements including healthier food choices, listening to satiety cues, and increased physical activity.  Sleep Disorders Clinical staff  conducted group or individual video education with verbal and written material and guidebook.  Patient learns how good quality and duration of sleep are important to overall health and well-being. Patient also learns about sleep disorders and how they impact health along with recommendations to address them, including discussing with a physician.  Nutrition  Dining Out - Part 2 Clinical staff conducted group or individual video education with verbal and written material and guidebook.  Patient learns how to plan ahead and communicate in order to maximize their dining experience in a healthy and nutritious manner. Included are recommended food choices based on the type of restaurant the patient is visiting.   Fueling a Banker conducted group or individual video education with verbal and written material and guidebook.  There is a strong connection between our food choices and our health. Diseases like obesity and type 2 diabetes are very prevalent and are in large-part due to lifestyle choices. The Pritikin Eating Plan provides plenty of food and hunger-curbing satisfaction. It is easy to follow, affordable, and helps reduce health risks.  Menu Workshop  Clinical staff conducted group or individual video education with verbal and written material and guidebook.  Patient learns that restaurant meals can sabotage health goals because they are often packed with calories, fat, sodium, and sugar. Recommendations include strategies to plan ahead and to communicate with the manager, chef, or server to help order a healthier meal.  Planning Your Eating Strategy  Clinical staff conducted group or individual video education with verbal and written material and guidebook.  Patient learns about the Pritikin Eating Plan and its benefit of reducing the risk of disease. The Pritikin Eating Plan does not focus on calories. Instead, it emphasizes high-quality, nutrient-rich foods. By knowing  the characteristics of the foods, we choose, we can determine their calorie density and make informed decisions.  Targeting Your Nutrition Priorities  Clinical staff conducted group or individual video education with verbal and written material and guidebook.  Patient learns that lifestyle habits have a tremendous impact on disease risk and progression. This video provides eating and physical activity recommendations based on your personal health goals, such as reducing LDL cholesterol, losing weight, preventing or controlling type 2 diabetes, and reducing high blood pressure.  Vitamins and Minerals  Clinical staff conducted group or individual video education with verbal and written material and guidebook.  Patient learns different ways to obtain key vitamins and minerals,  including through a recommended healthy diet. It is important to discuss all supplements you take with your doctor.   Healthy Mind-Set    Smoking Cessation  Clinical staff conducted group or individual video education with verbal and written material and guidebook.  Patient learns that cigarette smoking and tobacco addiction pose a serious health risk which affects millions of people. Stopping smoking will significantly reduce the risk of heart disease, lung disease, and many forms of cancer. Recommended strategies for quitting are covered, including working with your doctor to develop a successful plan.  Culinary   Becoming a Set designer conducted group or individual video education with verbal and written material and guidebook.  Patient learns that cooking at home can be healthy, cost-effective, quick, and puts them in control. Keys to cooking healthy recipes will include looking at your recipe, assessing your equipment needs, planning ahead, making it simple, choosing cost-effective seasonal ingredients, and limiting the use of added fats, salts, and sugars.  Cooking - Breakfast and Snacks  Clinical  staff conducted group or individual video education with verbal and written material and guidebook.  Patient learns how important breakfast is to satiety and nutrition through the entire day. Recommendations include key foods to eat during breakfast to help stabilize blood sugar levels and to prevent overeating at meals later in the day. Planning ahead is also a key component.  Cooking - Educational psychologist conducted group or individual video education with verbal and written material and guidebook.  Patient learns eating strategies to improve overall health, including an approach to cook more at home. Recommendations include thinking of animal protein as a side on your plate rather than center stage and focusing instead on lower calorie dense options like vegetables, fruits, whole grains, and plant-based proteins, such as beans. Making sauces in large quantities to freeze for later and leaving the skin on your vegetables are also recommended to maximize your experience.  Cooking - Healthy Salads and Dressing Clinical staff conducted group or individual video education with verbal and written material and guidebook.  Patient learns that vegetables, fruits, whole grains, and legumes are the foundations of the Pritikin Eating Plan. Recommendations include how to incorporate each of these in flavorful and healthy salads, and how to create homemade salad dressings. Proper handling of ingredients is also covered. Cooking - Soups and [employer] - Soups and Desserts Clinical staff conducted group or individual video education with verbal and written material and guidebook.  Patient learns that Pritikin soups and desserts make for easy, nutritious, and delicious snacks and meal components that are low in sodium, fat, sugar, and calorie density, while high in vitamins, minerals, and filling fiber. Recommendations include simple and healthy ideas for soups and desserts.   Overview     The  Pritikin Solution Program Overview Clinical staff conducted group or individual video education with verbal and written material and guidebook.  Patient learns that the results of the Pritikin Program have been documented in more than 100 articles published in peer-reviewed journals, and the benefits include reducing risk factors for (and, in some cases, even reversing) high cholesterol, high blood pressure, type 2 diabetes, obesity, and more! An overview of the three key pillars of the Pritikin Program will be covered: eating well, doing regular exercise, and having a healthy mind-set.  WORKSHOPS  Exercise: Exercise Basics: Building Your Action Plan Clinical staff led group instruction and group discussion with PowerPoint presentation and patient guidebook. To enhance  the learning environment the use of posters, models and videos may be added. At the conclusion of this workshop, patients will comprehend the difference between physical activity and exercise, as well as the benefits of incorporating both, into their routine. Patients will understand the FITT (Frequency, Intensity, Time, and Type) principle and how to use it to build an exercise action plan. In addition, safety concerns and other considerations for exercise and cardiac rehab will be addressed by the presenter. The purpose of this lesson is to promote a comprehensive and effective weekly exercise routine in order to improve patients' overall level of fitness.   Managing Heart Disease: Your Path to a Healthier Heart Clinical staff led group instruction and group discussion with PowerPoint presentation and patient guidebook. To enhance the learning environment the use of posters, models and videos may be added.At the conclusion of this workshop, patients will understand the anatomy and physiology of the heart. Additionally, they will understand how Pritikin's three pillars impact the risk factors, the progression, and the management  of heart disease.  The purpose of this lesson is to provide a high-level overview of the heart, heart disease, and how the Pritikin lifestyle positively impacts risk factors.  Exercise Biomechanics Clinical staff led group instruction and group discussion with PowerPoint presentation and patient guidebook. To enhance the learning environment the use of posters, models and videos may be added. Patients will learn how the structural parts of their bodies function and how these functions impact their daily activities, movement, and exercise. Patients will learn how to promote a neutral spine, learn how to manage pain, and identify ways to improve their physical movement in order to promote healthy living. The purpose of this lesson is to expose patients to common physical limitations that impact physical activity. Participants will learn practical ways to adapt and manage aches and pains, and to minimize their effect on regular exercise. Patients will learn how to maintain good posture while sitting, walking, and lifting.  Balance Training and Fall Prevention  Clinical staff led group instruction and group discussion with PowerPoint presentation and patient guidebook. To enhance the learning environment the use of posters, models and videos may be added. At the conclusion of this workshop, patients will understand the importance of their sensorimotor skills (vision, proprioception, and the vestibular system) in maintaining their ability to balance as they age. Patients will apply a variety of balancing exercises that are appropriate for their current level of function. Patients will understand the common causes for poor balance, possible solutions to these problems, and ways to modify their physical environment in order to minimize their fall risk. The purpose of this lesson is to teach patients about the importance of maintaining balance as they age and ways to minimize their risk of  falling.  WORKSHOPS   Nutrition:  Fueling a Ship broker led group instruction and group discussion with PowerPoint presentation and patient guidebook. To enhance the learning environment the use of posters, models and videos may be added. Patients will review the foundational principles of the Pritikin Eating Plan and understand what constitutes a serving size in each of the food groups. Patients will also learn Pritikin-friendly foods that are better choices when away from home and review make-ahead meal and snack options. Calorie density will be reviewed and applied to three nutrition priorities: weight maintenance, weight loss, and weight gain. The purpose of this lesson is to reinforce (in a group setting) the key concepts around what patients are recommended to eat  and how to apply these guidelines when away from home by planning and selecting Pritikin-friendly options. Patients will understand how calorie density may be adjusted for different weight management goals.  Mindful Eating  Clinical staff led group instruction and group discussion with PowerPoint presentation and patient guidebook. To enhance the learning environment the use of posters, models and videos may be added. Patients will briefly review the concepts of the Pritikin Eating Plan and the importance of low-calorie dense foods. The concept of mindful eating will be introduced as well as the importance of paying attention to internal hunger signals. Triggers for non-hunger eating and techniques for dealing with triggers will be explored. The purpose of this lesson is to provide patients with the opportunity to review the basic principles of the Pritikin Eating Plan, discuss the value of eating mindfully and how to measure internal cues of hunger and fullness using the Hunger Scale. Patients will also discuss reasons for non-hunger eating and learn strategies to use for controlling emotional eating.  Targeting Your  Nutrition Priorities Clinical staff led group instruction and group discussion with PowerPoint presentation and patient guidebook. To enhance the learning environment the use of posters, models and videos may be added. Patients will learn how to determine their genetic susceptibility to disease by reviewing their family history. Patients will gain insight into the importance of diet as part of an overall healthy lifestyle in mitigating the impact of genetics and other environmental insults. The purpose of this lesson is to provide patients with the opportunity to assess their personal nutrition priorities by looking at their family history, their own health history and current risk factors. Patients will also be able to discuss ways of prioritizing and modifying the Pritikin Eating Plan for their highest risk areas  Menu  Clinical staff led group instruction and group discussion with PowerPoint presentation and patient guidebook. To enhance the learning environment the use of posters, models and videos may be added. Using menus brought in from [employer], or printed from [employer], patients will apply the Pritikin dining out guidelines that were presented in the [employer] video. Patients will also be able to practice these guidelines in a variety of provided scenarios. The purpose of this lesson is to provide patients with the opportunity to practice hands-on learning of the Pritikin Dining Out guidelines with actual menus and practice scenarios.  Label Reading Clinical staff led group instruction and group discussion with PowerPoint presentation and patient guidebook. To enhance the learning environment the use of posters, models and videos may be added. Patients will review and discuss the Pritikin label reading guidelines presented in Pritikin's Label Reading Educational series video. Using fool labels brought in from local grocery stores and markets, patients will apply the  label reading guidelines and determine if the packaged food meet the Pritikin guidelines. The purpose of this lesson is to provide patients with the opportunity to review, discuss, and practice hands-on learning of the Pritikin Label Reading guidelines with actual packaged food labels. Cooking School  Pritikin's [employer] are designed to teach patients ways to prepare quick, simple, and affordable recipes at home. The importance of nutrition's role in chronic disease risk reduction is reflected in its emphasis in the overall Pritikin program. By learning how to prepare essential core Pritikin Eating Plan recipes, patients will increase control over what they eat; be able to customize the flavor of foods without the use of added salt, sugar, or fat; and improve the quality of  the food they consume. By learning a set of core recipes which are easily assembled, quickly prepared, and affordable, patients are more likely to prepare more healthy foods at home. These workshops focus on convenient breakfasts, simple entres, side dishes, and desserts which can be prepared with minimal effort and are consistent with nutrition recommendations for cardiovascular risk reduction. Cooking [employer] are taught by a Armed forces logistics/support/administrative officer (RD) who has been trained by the [employer]. The chef or RD has a clear understanding of the importance of minimizing - if not completely eliminating - added fat, sugar, and sodium in recipes. Throughout the series of Cooking School Workshop sessions, patients will learn about healthy ingredients and efficient methods of cooking to build confidence in their capability to prepare    Cooking School weekly topics:  Adding Flavor- Sodium-Free  Fast and Healthy Breakfasts  Powerhouse Plant-Based Proteins  Satisfying Salads and Dressings  Simple Sides and Sauces  International Cuisine-Spotlight on the [employer] Zones  Delicious Desserts  Savory  Soups  [employer] - Meals in a Astronomer Appetizers and Snacks  Comforting Weekend Breakfasts  One-Pot Wonders   Fast Evening Meals  Landscape architect Your Pritikin Plate  WORKSHOPS   Healthy Mindset (Psychosocial):  Focused Goals, Sustainable Changes Clinical staff led group instruction and group discussion with PowerPoint presentation and patient guidebook. To enhance the learning environment the use of posters, models and videos may be added. Patients will be able to apply effective goal setting strategies to establish at least one personal goal, and then take consistent, meaningful action toward that goal. They will learn to identify common barriers to achieving personal goals and develop strategies to overcome them. Patients will also gain an understanding of how our mind-set can impact our ability to achieve goals and the importance of cultivating a positive and growth-oriented mind-set. The purpose of this lesson is to provide patients with a deeper understanding of how to set and achieve personal goals, as well as the tools and strategies needed to overcome common obstacles which may arise along the way.  From Head to Heart: The Power of a Healthy Outlook  Clinical staff led group instruction and group discussion with PowerPoint presentation and patient guidebook. To enhance the learning environment the use of posters, models and videos may be added. Patients will be able to recognize and describe the impact of emotions and mood on physical health. They will discover the importance of self-care and explore self-care practices which may work for them. Patients will also learn how to utilize the 4 C's to cultivate a healthier outlook and better manage stress and challenges. The purpose of this lesson is to demonstrate to patients how a healthy outlook is an essential part of maintaining good health, especially as they continue their cardiac rehab journey.  Healthy  Sleep for a Healthy Heart Clinical staff led group instruction and group discussion with PowerPoint presentation and patient guidebook. To enhance the learning environment the use of posters, models and videos may be added. At the conclusion of this workshop, patients will be able to demonstrate knowledge of the importance of sleep to overall health, well-being, and quality of life. They will understand the symptoms of, and treatments for, common sleep disorders. Patients will also be able to identify daytime and nighttime behaviors which impact sleep, and they will be able to apply these tools to help manage sleep-related challenges. The purpose of this lesson is to provide patients with a  general overview of sleep and outline the importance of quality sleep. Patients will learn about a few of the most common sleep disorders. Patients will also be introduced to the concept of "sleep hygiene," and discover ways to self-manage certain sleeping problems through simple daily behavior changes. Finally, the workshop will motivate patients by clarifying the links between quality sleep and their goals of heart-healthy living.   Recognizing and Reducing Stress Clinical staff led group instruction and group discussion with PowerPoint presentation and patient guidebook. To enhance the learning environment the use of posters, models and videos may be added. At the conclusion of this workshop, patients will be able to understand the types of stress reactions, differentiate between acute and chronic stress, and recognize the impact that chronic stress has on their health. They will also be able to apply different coping mechanisms, such as reframing negative self-talk. Patients will have the opportunity to practice a variety of stress management techniques, such as deep abdominal breathing, progressive muscle relaxation, and/or guided imagery.  The purpose of this lesson is to educate patients on the role of stress in their  lives and to provide healthy techniques for coping with it.  Learning Barriers/Preferences:  Learning Barriers/Preferences - 06/19/22 1217       Learning Barriers/Preferences   Learning Barriers None    Learning Preferences Audio;Verbal Instruction             Education Topics:  Knowledge Questionnaire Score:  Knowledge Questionnaire Score - 06/19/22 1012       Knowledge Questionnaire Score   Pre Score 20/24    Post Score --             Core Components/Risk Factors/Patient Goals at Admission:  Personal Goals and Risk Factors at Admission - 06/19/22 0953       Core Components/Risk Factors/Patient Goals on Admission    Weight Management Yes    Intervention Weight Management: Develop a combined nutrition and exercise program designed to reach desired caloric intake, while maintaining appropriate intake of nutrient and fiber, sodium and fats, and appropriate energy expenditure required for the weight goal.;Weight Management: Provide education and appropriate resources to help participant work on and attain dietary goals.    Expected Outcomes Long Term: Adherence to nutrition and physical activity/exercise program aimed toward attainment of established weight goal;Weight Maintenance: Understanding of the daily nutrition guidelines, which includes 25-35% calories from fat, 7% or less cal from saturated fats, less than 200mg  cholesterol, less than 1.5gm of sodium, & 5 or more servings of fruits and vegetables daily;Understanding of distribution of calorie intake throughout the day with the consumption of 4-5 meals/snacks    Diabetes Yes    Intervention Provide education about signs/symptoms and action to take for hypo/hyperglycemia.;Provide education about proper nutrition, including hydration, and aerobic/resistive exercise prescription along with prescribed medications to achieve blood glucose in normal ranges: Fasting glucose 65-99 mg/dL    Expected Outcomes Short Term:  Participant verbalizes understanding of the signs/symptoms and immediate care of hyper/hypoglycemia, proper foot care and importance of medication, aerobic/resistive exercise and nutrition plan for blood glucose control.;Long Term: Attainment of HbA1C < 7%.    Hypertension Yes    Intervention Provide education on lifestyle modifcations including regular physical activity/exercise, weight management, moderate sodium restriction and increased consumption of fresh fruit, vegetables, and low fat dairy, alcohol moderation, and smoking cessation.;Monitor prescription use compliance.    Expected Outcomes Short Term: Continued assessment and intervention until BP is < 140/79mm HG in hypertensive participants. < 130/44mm  HG in hypertensive participants with diabetes, heart failure or chronic kidney disease.;Long Term: Maintenance of blood pressure at goal levels.    Lipids Yes    Intervention Provide education and support for participant on nutrition & aerobic/resistive exercise along with prescribed medications to achieve LDL 70mg , HDL >40mg .    Expected Outcomes Short Term: Participant states understanding of desired cholesterol values and is compliant with medications prescribed. Participant is following exercise prescription and nutrition guidelines.;Long Term: Cholesterol controlled with medications as prescribed, with individualized exercise RX and with personalized nutrition plan. Value goals: LDL < 70mg , HDL > 40 mg.    Stress Yes    Intervention Offer individual and/or small group education and counseling on adjustment to heart disease, stress management and health-related lifestyle change. Teach and support self-help strategies.;Refer participants experiencing significant psychosocial distress to appropriate mental health specialists for further evaluation and treatment. When possible, include family members and significant others in education/counseling sessions.    Expected Outcomes Short Term:  Participant demonstrates changes in health-related behavior, relaxation and other stress management skills, ability to obtain effective social support, and compliance with psychotropic medications if prescribed.;Long Term: Emotional wellbeing is indicated by absence of clinically significant psychosocial distress or social isolation.    Personal Goal Other Yes    Personal Goal Be able to fully getback to work (involves heavy lifting).    Intervention Provide individualized exercise prescription including resistance training exercise that patient can do at home to help build strength.    Expected Outcomes Patient will be able to fully return to work activities.             Core Components/Risk Factors/Patient Goals Review:   Goals and Risk Factor Review     Row Name 07/11/22 1629 08/06/22 0838 09/03/22 1304         Core Components/Risk Factors/Patient Goals Review   Personal Goals Review Weight Management/Obesity;Lipids;Diabetes;Stress;Hypertension Weight Management/Obesity;Lipids;Diabetes;Stress;Hypertension Weight Management/Obesity;Lipids;Diabetes;Stress;Hypertension     Review [name] has been doing well with exercise at intensive cardiac rehab. vital signs have been stable. [name] does not check his CBg's at home as he does not have a meter [name] has been doing well with exercise at intensive cardiac rehab. vital signs have been stable. [name] will complete intensive cardiac rehab at the end of the month. [name] has missed some classes due to work obligations [name] has been doing well with exercise at intensive cardiac rehab. vital signs have been stable. [name] will complete intensive cardiac rehab at the end of the month. [name] has missed some classes due to work obligations. [name] has a positive attitude toward rehab.     Expected Outcomes [name] will continue to participate in intensive cardiac rehab for exercise, nutrition and lifestyle modifications [name] will continue to  participate in intensive cardiac rehab for exercise, nutrition and lifestyle modifications [name] will continue to participate in intensive cardiac rehab for exercise, nutrition and lifestyle modifications              Core Components/Risk Factors/Patient Goals at Discharge (Final Review):   Goals and Risk Factor Review - 09/03/22 1304       Core Components/Risk Factors/Patient Goals Review   Personal Goals Review Weight Management/Obesity;Lipids;Diabetes;Stress;Hypertension    Review [name] has been doing well with exercise at intensive cardiac rehab. vital signs have been stable. [name] will complete intensive cardiac rehab at the end of the month. [name] has missed some classes due to work obligations. [name] has a positive attitude toward rehab.    Expected Outcomes  [name] will continue to participate in intensive cardiac rehab for exercise, nutrition and lifestyle modifications             ITP Comments:  ITP Comments     Row Name 06/19/22 0825 06/25/22 1649 07/11/22 1622 08/06/22 0836     ITP Comments Medical Director- Dr. Armanda Magic, MD Introduction to Pritikin Education Program/ Intensive Cardiac Rehab. Initial Orientation Packet Reviewed with the patient 30 Day ITP Note. [name] started intensive cardiac rehab on 06/25/22 and did well with exercise 30 Day ITP Note. [name] has good attendance and participation in  intensive cardiac rehab 30 Day ITP Note. [name] has good  participation when in attendance  at  intensive cardiac rehab             Comments: See ITP comments.

## 2022-09-05 ENCOUNTER — Encounter (HOSPITAL_COMMUNITY): Payer: PPO

## 2022-09-05 ENCOUNTER — Encounter (HOSPITAL_COMMUNITY)
Admission: RE | Admit: 2022-09-05 | Discharge: 2022-09-05 | Disposition: A | Discharge status: Home / Self Care | Payer: PPO | Source: Ambulatory Visit | Attending: Interventional Cardiology | Admitting: Interventional Cardiology

## 2022-09-05 DIAGNOSIS — I214 Non-ST elevation (NSTEMI) myocardial infarction: Secondary | ICD-10-CM | POA: Diagnosis not present

## 2022-09-05 DIAGNOSIS — Z9861 Coronary angioplasty status: Secondary | ICD-10-CM

## 2022-09-05 NOTE — Progress Notes (Signed)
Daily Session Note  Patient Details  Name: [name] MRN: [id number] Date of Birth: Jan 26, 1956 Referring Provider:   Flowsheet Row INTENSIVE CARDIAC REHAB ORIENT from 06/19/2022 in [facility]  Referring Provider Corky Crafts, MD       Encounter Date: 09/05/2022  Check In:  Session Check In - 09/05/22 4098       Check-In   Supervising physician immediately available to respond to emergencies [facility] MD immediately available    Physician(s) Carlos Levering, NP    Location [facility]    Staff Present Harriett Sine, RN, MHA;Jetta Dan Humphreys BS, ACSM-CEP, Exercise Physiologist;Kaylee Earlene Plater, MS, ACSM-CEP, Exercise Physiologist;Olinty Peggye Pitt, MS, ACSM-CEP, Exercise Physiologist    Virtual Visit No    Medication changes reported     No    Fall or balance concerns reported    No    Tobacco Cessation No Change    Warm-up and Cool-down Performed as group-led instruction    Resistance Training Performed No    VAD Patient? No    PAD/SET Patient? No      Pain Assessment   Currently in Pain? No/denies    Pain Score 0-No pain    Multiple Pain Sites No             Capillary Blood Glucose: No results found for this or any previous visit (from the past 24 hour(s)).    Social History   Tobacco Use  Smoking Status Former   Types: Cigarettes   Quit date: 06/06/2000   Years since quitting: 22.2  Smokeless Tobacco Never  Tobacco Comments   2002    Goals Met:  Independence with exercise equipment Exercise tolerated well No report of concerns or symptoms today  Goals Unmet:  Not Applicable  Comments: This morning [name] was noted to have an increase frequency in changes to the QRS complex during exercise, occurring while walking/jogging on the treadmill and continued intermittently while in cool-down/ relaxation. [name] reported no symptoms and said he felt fine throughout exercise.  The supervising provider Carlos Levering NP reviewed the ecg tracings and communicated no concerns. [name] remained asymptomatic, completed his exercise session, and discharged home. Exit BP 108/76.    Dr. Armanda Magic is Medical Director for Cardiac Rehab at [facility].

## 2022-09-07 ENCOUNTER — Encounter (HOSPITAL_COMMUNITY): Payer: PPO

## 2022-09-10 ENCOUNTER — Encounter (HOSPITAL_COMMUNITY): Payer: PPO

## 2022-09-10 ENCOUNTER — Encounter (HOSPITAL_COMMUNITY)
Admission: RE | Admit: 2022-09-10 | Discharge: 2022-09-10 | Disposition: A | Discharge status: Home / Self Care | Payer: PPO | Source: Ambulatory Visit | Attending: Interventional Cardiology | Admitting: Interventional Cardiology

## 2022-09-10 VITALS — Ht 68.0 in | Wt 169.3 lb

## 2022-09-10 DIAGNOSIS — Z9861 Coronary angioplasty status: Secondary | ICD-10-CM

## 2022-09-10 DIAGNOSIS — I214 Non-ST elevation (NSTEMI) myocardial infarction: Secondary | ICD-10-CM | POA: Diagnosis not present

## 2022-09-12 ENCOUNTER — Encounter (HOSPITAL_COMMUNITY): Payer: PPO

## 2022-09-14 ENCOUNTER — Encounter (HOSPITAL_COMMUNITY)
Admission: RE | Admit: 2022-09-14 | Discharge: 2022-09-14 | Disposition: A | Discharge status: Home / Self Care | Payer: PPO | Source: Ambulatory Visit | Attending: Interventional Cardiology | Admitting: Interventional Cardiology

## 2022-09-14 ENCOUNTER — Telehealth: Payer: Self-pay

## 2022-09-14 ENCOUNTER — Encounter (HOSPITAL_COMMUNITY): Payer: PPO

## 2022-09-14 DIAGNOSIS — I214 Non-ST elevation (NSTEMI) myocardial infarction: Secondary | ICD-10-CM

## 2022-09-14 DIAGNOSIS — Z9861 Coronary angioplasty status: Secondary | ICD-10-CM

## 2022-09-14 NOTE — Telephone Encounter (Signed)
Call to pt reference referral to PREP from Cardiac Rehab.  Just finished. Interested in participating. Prefers morning class.  Next am class will start 10/01/22 on M/W at 1030a.  Will call pt next week to set up intake appt.

## 2022-09-17 NOTE — Progress Notes (Signed)
Discharge Progress Report  Patient Details  Name: [name] MRN: [id number] Date of Birth: 1955-09-22 Referring Provider:   Flowsheet Row INTENSIVE CARDIAC REHAB ORIENT from 06/19/2022 in [facility]  Referring Provider Corky Crafts, MD        Number of Visits: 25 visits/ 27 sessions  Reason for Discharge:  Patient reached a stable level of exercise. Patient independent in their exercise. Patient has met program and personal goals.  Smoking History:  Social History   Tobacco Use  Smoking Status Former   Current packs/day: 0.00   Types: Cigarettes   Quit date: 06/06/2000   Years since quitting: 22.2  Smokeless Tobacco Never  Tobacco Comments   2002    Diagnosis:  05/23/22 NSTEMI  05/23/22 S/P PTCA of of CFX - Plan: Amb Referral To Provider Referral Exercise Program (P.R.E.P)  NSTEMI (non-ST elevated myocardial infarction) (HCC) - Plan: Amb Referral To Provider Referral Exercise Program (P.R.E.P)  ADL [facility]SD:   Initial Exercise Prescription:  Initial Exercise Prescription - 06/19/22 0900       Date of Initial Exercise RX and Referring Provider   Date 06/19/22    Referring Provider Corky Crafts, MD    Expected Discharge Date 08/31/22      Recumbant Bike   Level 2    Watts 30    Minutes 15    METs 3.2      NuStep   Level 2    SPM 85    Minutes 15    METs 2.5      Prescription Details   Frequency (times per week) 3    Duration Progress to 30 minutes of continuous aerobic without signs/symptoms of physical distress      Intensity   THRR 40-80% of Max Heartrate 62-124    Ratings of Perceived Exertion 11-13    Perceived Dyspnea 0-4      Progression   Progression Continue to progress workloads to maintain intensity without signs/symptoms of physical distress.      Resistance Training   Training Prescription Yes    Weight 4 lbs    Reps 10-15             Discharge  Exercise Prescription (Final Exercise Prescription Changes):  Exercise Prescription Changes - 09/14/22 1421       Response to Exercise   Blood Pressure (Admit) 120/68    Blood Pressure (Exercise) 110/70    Blood Pressure (Exit) 102/76    Heart Rate (Admit) 96 bpm    Heart Rate (Exercise) 132 bpm    Heart Rate (Exit) 99 bpm    Rating of Perceived Exertion (Exercise) 9    Symptoms None    Comments Pt graduated the [facility] CRP2    Duration Continue with 30 min of aerobic exercise without signs/symptoms of physical distress.    Intensity THRR unchanged      Progression   Progression Continue to progress workloads to maintain intensity without signs/symptoms of physical distress.    Average METs 5.4      Resistance Training   Training Prescription Yes    Weight 6    Reps 10-15      Interval Training   Interval Training No      Elliptical   Level 1    Speed 1    Minutes 15    METs 5.4      Rower   Level 10    Watts 42  Minutes 15    METs 5.41      Home Exercise Plan   Plans to continue exercise at [facility] (comment)    Frequency Add 4 additional days to program exercise sessions.    Initial Home Exercises Provided 07/18/22             Functional Capacity:  6 Minute Walk     Row Name 06/19/22 0915 09/10/22 0826       6 Minute Walk   Phase Initial Discharge    Distance 1418 feet 1665 feet    Distance % Change -- 17.42 %    Distance Feet Change -- 247 ft    Walk Time 6 minutes 6 minutes    # of Rest Breaks 0 0    MPH 2.68 3.15    METS 3.25 3.91    RPE 7 7    Perceived Dyspnea  0 0    VO2 Peak 11.37 13.67    Symptoms No Yes (comment)    Comments -- Left foot pain 6/10 has appt with ortho to get checked out    Resting HR 95 bpm 93 bpm    Resting BP 118/78 120/78    Resting Oxygen Saturation  99 % --    Exercise Oxygen Saturation  during 6 min walk 98 % --    Max Ex. HR 102 bpm 114 bpm    Max Ex. BP 100/66 112/70    2 Minute Post BP  110/72 100/72             Psychological, QOL, Others - Outcomes: PHQ 2/9:    09/14/2022    7:12 AM 06/19/2022   10:14 AM 12/19/2017   10:21 AM 06/04/2016    9:09 AM 02/01/2016    3:58 PM  Depression screen PHQ 2/9  Decreased Interest 0 0 0 0 0  Down, Depressed, Hopeless 0 0 0 0 0  PHQ - 2 Score 0 0 0 0 0  Altered sleeping 0 0     Tired, decreased energy 1 0     Change in appetite 1 1     Feeling bad or failure about yourself  0 0     Trouble concentrating 0 0     Moving slowly or fidgety/restless 0 0     Suicidal thoughts 0 0     PHQ-9 Score 2 1     Difficult doing work/chores Not difficult at all Not difficult at all       Quality of Life:  Quality of Life - 09/14/22 0834       Quality of Life   Select Quality of Life      Quality of Life Scores   Health/Function Post 26.85 %    Socioeconomic Post 22.21 %    Psych/Spiritual Post 25.64 %    Family Post 24.1 %    GLOBAL Post 25.14 %             Personal Goals: Goals established at orientation with interventions provided to work toward goal.  Personal Goals and Risk Factors at Admission - 06/19/22 0953       Core Components/Risk Factors/Patient Goals on Admission    Weight Management Yes    Intervention Weight Management: Develop a combined nutrition and exercise program designed to reach desired caloric intake, while maintaining appropriate intake of nutrient and fiber, sodium and fats, and appropriate energy expenditure required for the weight goal.;Weight Management: Provide education and appropriate resources to help participant work  on and attain dietary goals.    Expected Outcomes Long Term: Adherence to nutrition and physical activity/exercise program aimed toward attainment of established weight goal;Weight Maintenance: Understanding of the daily nutrition guidelines, which includes 25-35% calories from fat, 7% or less cal from saturated fats, less than 200mg  cholesterol, less than 1.5gm of sodium, & 5 or  more servings of fruits and vegetables daily;Understanding of distribution of calorie intake throughout the day with the consumption of 4-5 meals/snacks    Diabetes Yes    Intervention Provide education about signs/symptoms and action to take for hypo/hyperglycemia.;Provide education about proper nutrition, including hydration, and aerobic/resistive exercise prescription along with prescribed medications to achieve blood glucose in normal ranges: Fasting glucose 65-99 mg/dL    Expected Outcomes Short Term: Participant verbalizes understanding of the signs/symptoms and immediate care of hyper/hypoglycemia, proper foot care and importance of medication, aerobic/resistive exercise and nutrition plan for blood glucose control.;Long Term: Attainment of HbA1C < 7%.    Hypertension Yes    Intervention Provide education on lifestyle modifcations including regular physical activity/exercise, weight management, moderate sodium restriction and increased consumption of fresh fruit, vegetables, and low fat dairy, alcohol moderation, and smoking cessation.;Monitor prescription use compliance.    Expected Outcomes Short Term: Continued assessment and intervention until BP is < 140/12mm HG in hypertensive participants. < 130/73mm HG in hypertensive participants with diabetes, heart failure or chronic kidney disease.;Long Term: Maintenance of blood pressure at goal levels.    Lipids Yes    Intervention Provide education and support for participant on nutrition & aerobic/resistive exercise along with prescribed medications to achieve LDL 70mg , HDL >40mg .    Expected Outcomes Short Term: Participant states understanding of desired cholesterol values and is compliant with medications prescribed. Participant is following exercise prescription and nutrition guidelines.;Long Term: Cholesterol controlled with medications as prescribed, with individualized exercise RX and with personalized nutrition plan. Value goals: LDL < 70mg ,  HDL > 40 mg.    Stress Yes    Intervention Offer individual and/or small group education and counseling on adjustment to heart disease, stress management and health-related lifestyle change. Teach and support self-help strategies.;Refer participants experiencing significant psychosocial distress to appropriate mental health specialists for further evaluation and treatment. When possible, include family members and significant others in education/counseling sessions.    Expected Outcomes Short Term: Participant demonstrates changes in health-related behavior, relaxation and other stress management skills, ability to obtain effective social support, and compliance with psychotropic medications if prescribed.;Long Term: Emotional wellbeing is indicated by absence of clinically significant psychosocial distress or social isolation.    Personal Goal Other Yes    Personal Goal Be able to fully getback to work (involves heavy lifting).    Intervention Provide individualized exercise prescription including resistance training exercise that patient can do at home to help build strength.    Expected Outcomes Patient will be able to fully return to work activities.              Personal Goals Discharge:  Goals and Risk Factor Review     Row Name 07/11/22 1629 08/06/22 0838 09/03/22 1304         Core Components/Risk Factors/Patient Goals Review   Personal Goals Review Weight Management/Obesity;Lipids;Diabetes;Stress;Hypertension Weight Management/Obesity;Lipids;Diabetes;Stress;Hypertension Weight Management/Obesity;Lipids;Diabetes;Stress;Hypertension     Review Reggie has been doing well with exercise at intensive cardiac rehab. vital signs have been stable. Reggie does not check his CBg's at home as he does not have a meter Reggie has been doing well with exercise at intensive  cardiac rehab. vital signs have been stable. Reggie will complete intensive cardiac rehab at the end of the month. Reggie has  missed some classes due to work obligations Reggie has been doing well with exercise at intensive cardiac rehab. vital signs have been stable. Reggie will complete intensive cardiac rehab at the end of the month. Reggie has missed some classes due to work obligations. Reggie has a positive attitude toward rehab. Lost 2.2 kg.     Expected Outcomes Reggie will continue to participate in intensive cardiac rehab for exercise, nutrition and lifestyle modifications Reggie will continue to participate in intensive cardiac rehab for exercise, nutrition and lifestyle modifications Reggie will continue to participate in intensive cardiac rehab for exercise, nutrition and lifestyle modifications              Exercise Goals and Review:  Exercise Goals     Row Name 06/19/22 0901 06/25/22 0825           Exercise Goals   Increase Physical Activity Yes Yes      Intervention Provide advice, education, support and counseling about physical activity/exercise needs.;Develop an individualized exercise prescription for aerobic and resistive training based on initial evaluation findings, risk stratification, comorbidities and participant's personal goals. Provide advice, education, support and counseling about physical activity/exercise needs.;Develop an individualized exercise prescription for aerobic and resistive training based on initial evaluation findings, risk stratification, comorbidities and participant's personal goals.      Expected Outcomes Short Term: Attend rehab on a regular basis to increase amount of physical activity.;Long Term: Add in home exercise to make exercise part of routine and to increase amount of physical activity.;Long Term: Exercising regularly at least 3-5 days a week. Short Term: Attend rehab on a regular basis to increase amount of physical activity.;Long Term: Add in home exercise to make exercise part of routine and to increase amount of physical activity.;Long Term: Exercising  regularly at least 3-5 days a week.      Increase Strength and Stamina Yes Yes      Intervention Provide advice, education, support and counseling about physical activity/exercise needs.;Develop an individualized exercise prescription for aerobic and resistive training based on initial evaluation findings, risk stratification, comorbidities and participant's personal goals. Provide advice, education, support and counseling about physical activity/exercise needs.;Develop an individualized exercise prescription for aerobic and resistive training based on initial evaluation findings, risk stratification, comorbidities and participant's personal goals.      Expected Outcomes Short Term: Increase workloads from initial exercise prescription for resistance, speed, and METs.;Short Term: Perform resistance training exercises routinely during rehab and add in resistance training at home;Long Term: Improve cardiorespiratory fitness, muscular endurance and strength as measured by increased METs and functional capacity ( ) Short Term: Increase workloads from initial exercise prescription for resistance, speed, and METs.;Short Term: Perform resistance training exercises routinely during rehab and add in resistance training at home;Long Term: Improve cardiorespiratory fitness, muscular endurance and strength as measured by increased METs and functional capacity ( )      Able to understand and use rate of perceived exertion (RPE) scale Yes Yes      Intervention Provide education and explanation on how to use RPE scale Provide education and explanation on how to use RPE scale      Expected Outcomes Short Term: Able to use RPE daily in rehab to express subjective intensity level;Long Term:  Able to use RPE to guide intensity level when exercising independently Short Term: Able to use RPE daily in rehab to express subjective  intensity level;Long Term:  Able to use RPE to guide intensity level when exercising independently       Knowledge and understanding of Target Heart Rate Range (THRR) Yes Yes      Intervention Provide education and explanation of THRR including how the numbers were predicted and where they are located for reference Provide education and explanation of THRR including how the numbers were predicted and where they are located for reference      Expected Outcomes Short Term: Able to state/look up THRR;Long Term: Able to use THRR to govern intensity when exercising independently;Short Term: Able to use daily as guideline for intensity in rehab Short Term: Able to state/look up THRR;Long Term: Able to use THRR to govern intensity when exercising independently;Short Term: Able to use daily as guideline for intensity in rehab      Able to check pulse independently Yes Yes      Intervention Provide education and demonstration on how to check pulse in carotid and radial arteries.;Review the importance of being able to check your own pulse for safety during independent exercise Provide education and demonstration on how to check pulse in carotid and radial arteries.;Review the importance of being able to check your own pulse for safety during independent exercise      Expected Outcomes Short Term: Able to explain why pulse checking is important during independent exercise;Long Term: Able to check pulse independently and accurately Short Term: Able to explain why pulse checking is important during independent exercise;Long Term: Able to check pulse independently and accurately      Understanding of Exercise Prescription Yes Yes      Intervention Provide education, explanation, and written materials on patient's individual exercise prescription Provide education, explanation, and written materials on patient's individual exercise prescription      Expected Outcomes Short Term: Able to explain program exercise prescription;Long Term: Able to explain home exercise prescription to exercise independently Short Term: Able  to explain program exercise prescription;Long Term: Able to explain home exercise prescription to exercise independently               Exercise Goals Re-Evaluation:  Exercise Goals Re-Evaluation     Row Name 06/25/22 0810 06/25/22 0825 07/18/22 0916 08/15/22 0846 09/14/22 1426     Exercise Goal Re-Evaluation   Exercise Goals Review Increase Physical Activity;Understanding of Exercise Prescription;Increase Strength and Stamina;Knowledge and understanding of Target Heart Rate Range (THRR);Able to check pulse independently;Able to understand and use rate of perceived exertion (RPE) scale Increase Physical Activity;Understanding of Exercise Prescription;Increase Strength and Stamina;Knowledge and understanding of Target Heart Rate Range (THRR);Able to check pulse independently;Able to understand and use rate of perceived exertion (RPE) scale Increase Physical Activity;Understanding of Exercise Prescription;Increase Strength and Stamina;Knowledge and understanding of Target Heart Rate Range (THRR);Able to check pulse independently;Able to understand and use rate of perceived exertion (RPE) scale Increase Physical Activity;Understanding of Exercise Prescription;Increase Strength and Stamina;Knowledge and understanding of Target Heart Rate Range (THRR);Able to check pulse independently;Able to understand and use rate of perceived exertion (RPE) scale Increase Physical Activity;Understanding of Exercise Prescription;Increase Strength and Stamina;Knowledge and understanding of Target Heart Rate Range (THRR);Able to check pulse independently;Able to understand and use rate of perceived exertion (RPE) scale   Comments Pt completed first day of exercise. Discussed exercise prescription, RPE scale, and target heart rate range. Nahshon exercised for 15 min on the Nustep and recumbent bike. He averaged 2.6 METs at level 2 on the Nustep and 3.4 METs  at level 2.5  on the recumbent bike. He performed the warmup and  cooldown standing without any limitations. Pt completed first day of exercise. Discussed exercise prescription, RPE scale, and target heart rate range. Nathon exercised for 15 min on the Nustep and recumbent bike. He averaged 2.6 METs at level 2 on the Nustep and 3.4 METs  at level 2.5 on the recumbent bike. He performed the warmup and cooldown standing without any limitations. Reviewed goals and home ExRx. Pt tolerated exercise well with an average MET level of 4.2. Pt will exercie on his own by going to stretch classes, [employer] and walking 5-6 days a week for 30-45 mins per session. Pt feels good about progress so far and gave some additional resouces for gradual weight training at the [facility] Reviewed goals and MET's. Pt tolerated exercise well with an average MET level of 5.69. Pt is increasing MET's and is feeling good with exercise. Pt is doing well with his goals and is back at work and feeling good Pt graduated the [employer]. Pt tolerated exercise well with an average MET level of 5.41. Pt did well and increased his by 253ft for a total of 1634ft. Pt Will continue to exercise on his own by joining the [employer] program and/or use a personal trainer 5-7 days for 30-45 mins per session   Expected Outcomes Will continue to monitor pt and progress workloads as tolerated without sign or symtpom Will continue to monitor pt and progress workloads as tolerated without sign or symtpom Will continue to monitor pt and progress workloads as tolerated without sign or symtpom Will continue to monitor pt and progress workloads as tolerated without sign or symtpom Pt will continue to exercise on his own and gain strength            Nutrition & Weight - Outcomes:  Pre Biometrics - 06/19/22 0825       Pre Biometrics   Waist Circumference 36.75 inches    Hip Circumference 38 inches    Waist to Hip Ratio 0.97 %    Triceps Skinfold 13 mm    % Body Fat 24.8 %    Grip Strength 28 kg     Flexibility 14 in    Single Leg Stand 30 seconds             Post Biometrics - 09/10/22 0830        Post  Biometrics   Height 5\' 8"  (1.727 m)    Weight 76.8 kg    Waist Circumference 36 inches    Hip Circumference 36.5 inches    Waist to Hip Ratio 0.99 %    BMI (Calculated) 25.75    Triceps Skinfold 14 mm    % Body Fat 24.6 %    Grip Strength 30 kg    Flexibility 18 in    Single Leg Stand 30 seconds             Nutrition:  Nutrition Therapy & Goals - 09/14/22 0812       Nutrition Therapy   Diet Heart Healthy Diet    Drug/Food Interactions Statins/Certain Fruits      Personal Nutrition Goals   Nutrition Goal Patient to identify strategies for reducing cardiovascular risk by attending the [facility] education and nutrition series weekly.   goal not met.   Personal Goal #2 Patient to improve diet quality by using the plate method as a guide for meal planning to include lean protein/plant protein, fruits, vegetables, whole grains,  nonfat dairy as part of a well-balanced diet.   goal in progess   Comments Goals in progress. Keelen does not attend the [facility] education and nutrition series. He continues Ozempic to aid with weightloss/blood sugar control; he is down ~6.4# since starting with our program and blood sugar has improved to a prediabtic range 5.9.  Lipids improved; LDL at goal of <70. Connie will benefit from implementation of heart healthy nutrition, exercise, and continued lifestyle modification.      Intervention Plan   Intervention Prescribe, educate and counsel regarding individualized specific dietary modifications aiming towards targeted core components such as weight, hypertension, lipid management, diabetes, heart failure and other comorbidities.;Nutrition handout(s) given to patient.    Expected Outcomes Short Term Goal: Understand basic principles of dietary content, such as calories, fat, sodium, cholesterol and nutrients.;Long Term Goal: Adherence to  prescribed nutrition plan.             Nutrition Discharge:  Nutrition Assessments - 09/17/22 0811       Rate Your Plate Scores   Post Score 57             Education Questionnaire Score:  Knowledge Questionnaire Score - 09/14/22 0835       Knowledge Questionnaire Score   Post Score 21/24             Goals reviewed with patient; copy given to patient. Patient graduated from cardiac rehab program 09/14/2022 with completion of  25 exercise sessions and 2 education sessions. Patient progressed nicely during his participation in rehab as evidenced by increased MET level and increased walking distance in post 6-minute walk test. Medication list reconciled. Repeat  PHQ9 score=2. Pt has made lifestyle changes and is commended for his progress.  Reggie achieved goals during cardiac rehab. He plans to continue exercise independently at home and has also been referred to the [facility] PREP program per his request.

## 2022-09-21 ENCOUNTER — Telehealth: Payer: Self-pay

## 2022-09-21 NOTE — Telephone Encounter (Signed)
Call to pt to schedule intake for PREP starting 10/01/22.  Intake scheduled for 09/25/22 at 1130a. Will meet in the lobby of the [facility]

## 2022-09-25 NOTE — Progress Notes (Signed)
[facility] PREP Evaluation  Patient Details  Name: [name] MRN: [id number] Date of Birth: 03/12/1955 Age: 67 y.o. PCP: Virgilio Belling, PA-C  Vitals:   09/25/22 1153  BP: 102/72  Pulse: (!) 102  SpO2: 99%  Weight: 168 lb 9.6 oz (76.5 kg)     [facility] Eval - 09/25/22 1100       [facility] "PREP" Location   [facility] "PREP" Location [facility]      Referral    Referring Provider Eldridge Dace    Reason for referral High Cholesterol;Hypertension   Post MI   Program Start Date 10/01/22   MW [id number] x 12 wks     Measurement   Waist Circumference 37 inches    Hip Circumference 36 inches    Body fat 26.8 percent      Information for Trainer   Goals Get stronger, dec A1C, inc endurance    Current Exercise 2 walks 45 min   2x per week    Orthopedic Concerns Right ankle surgery, Left foot forefoot pain, neck pain    Pertinent Medical History HTN, CAD MI x 7, high lipids, DM2    Current Barriers work    Restrictions/Precautions Diabetic snack before exercise    Medications that affect exercise Medication causing dizziness/drowsiness      Timed Up and Go (TUGS)   Timed Up and Go Low risk <9 seconds      Mobility and Daily Activities   I find it easy to walk up or down two or more flights of stairs. 4    I have no trouble taking out the trash. 4    I do housework such as vacuuming and dusting on my own without difficulty. 4    I can easily lift a gallon of milk (8lbs). 4    I can easily walk a mile. 4    I have no trouble reaching into high cupboards or reaching down to pick up something from the floor. 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 4      Mobility and Daily Activities   I feel younger than my age. 2    I feel independent. 4    I feel energetic. 2    I live an active life.  4    I feel strong. 2    I feel healthy. 2    I feel active as other people my age. 4      How fit and strong are you.   Fit and  Strong Total Score 48            Past Medical History:  Diagnosis Date   Arthritis    Bladder disorder    Cancer (HCC)    hx prostate cancer and CML   Chronic myelocytic leukemia (HCC)    Coronary artery disease    with prior MI 2002(BMS) and Cfx(2006) and RCA (2004) stents . EF 45-50%   Coronary atherosclerosis of native coronary artery    stable and suspect a component of CAS   Diverticulosis    seen on colonoscopy in 2008   Goals of care, counseling/discussion 09/13/2016   H/O colonoscopy 2014   Hyperlipidemia    Hypertension    Myocardial infarction [facility]) 02,6/12   Prostate cancer [facility])    Past Surgical History:  Procedure Laterality Date   CARDIAC CATHETERIZATION     normal EF, patent stents without obstructive  disease (on Plavix X 1 month only)   CAROTID STENT     CORONARY BALLOON ANGIOPLASTY N/A 05/23/2022   Procedure: CORONARY BALLOON ANGIOPLASTY;  Surgeon: Corky Crafts, MD;  [facility] LAB;  Service: Cardiovascular;  Laterality: N/A;   CORONARY PRESSURE/FFR STUDY N/A 05/23/2022   Procedure: INTRAVASCULAR PRESSURE WIRE/FFR STUDY;  Surgeon: Corky Crafts, MD;  Location: [facility] INVASIVE CV LAB;  Service: Cardiovascular;  Laterality: N/A;   FRACTURE SURGERY     rt wrist   LEFT HEART CATH AND CORONARY ANGIOGRAPHY N/A 05/23/2022   Procedure: LEFT HEART CATH AND CORONARY ANGIOGRAPHY;  Surgeon: Corky Crafts, MD;  Location: [facility] INVASIVE CV LAB;  Service: Cardiovascular;  Laterality: N/A;   LEFT HEART CATHETERIZATION WITH CORONARY ANGIOGRAM N/A 02/04/2012   Procedure: LEFT HEART CATHETERIZATION WITH CORONARY ANGIOGRAM;  Surgeon: Lesleigh Noe, MD;  Location: [facility] CATH LAB;  Service: Cardiovascular;  Laterality: N/A;   ORIF ANKLE FRACTURE  07/04/2011   Procedure: OPEN REDUCTION INTERNAL FIXATION (ORIF) ANKLE FRACTURE;  Surgeon: Harvie Junior, MD;  Location: [facility];  Service: Orthopedics;  Laterality: Right;   TOE SURGERY     rt  great toe   TONSILLECTOMY     WRIST SURGERY     rt and lt cysts removed   Social History   Tobacco Use  Smoking Status Former   Current packs/day: 0.00   Types: Cigarettes   Quit date: 06/06/2000   Years since quitting: 22.3  Smokeless Tobacco Never  Tobacco Comments   2002    Pam Judie Petit Icess Bertoni 09/25/2022, 11:58 AM

## 2022-10-02 NOTE — Progress Notes (Deleted)
Office Visit    Patient Name: [name] Date of Encounter: 10/02/2022  PCP:  Chyrel Masson   [facility]  Cardiologist:  Lance Muss, MD  Advanced Practice Provider:  No care team member to display Electrophysiologist:  None   HPI    [name] is a 67 y.o. male with a medical history significant for CAD/NSTEMI, CML, hypertension, hyperlipidemia, prostate cancer presents today for ED follow-up.  Presented to the ER 3/20 for evaluation of chest pain.  Had chest discomfort and pain for the last 4 months but progressively worsened.  Initially thought to be attributed to GERD.  At that time patient denied nausea, vomiting, lightheadedness or dizziness.  No diaphoresis but does have midsternal chest discomfort that radiates across the chest from shoulder to shoulder.  Treated for reflux and evaluated by his GI physician who wanted to perform an endoscopy.  Patient had been taking medicines without improvement and associated nausea worsening with the chest discomfort.  States that pain did not radiate to back or abdomen.  Originally evaluated at [facility] but given elevated troponins was transferred to [facility] for further evaluation.  Plan for cardiac catheterization per cardiology with concern for NSTEMI.  Placed on a heparin drip.  In the Cath Lab, was found to have severe in-stent stenosis of his proximal circumflex stent and PTCA was performed and recommended dual antiplatelet therapy.  Echo showed LVEF 45 to 50%, grade 1 DD, left ventricular regional wall motion abnormalities.  Troponin trending down and discharge was appropriate at that time.  The patient presented to the ER for evaluation of chest pain on 05/25/2022.  Patient suffered from NSTEMI 2 days ago and was taken for angioplasty by Dr. Eldridge Dace.  Patient reported that he was discharged on 21 March.  That morning following discharge she had several episodes of sharp  central chest pain described as a strange pressure-like sensation that did not radiate.  Patient took 3 nitros as well as 5 aspirins prior to arrival.  He reported compliance with all his medications.  Initial high-sensitivity troponin 393, delta 395.  Flat and improving since admission 2 days prior.  EKG reviewed without any obvious acute ischemic changes.  Evaluated by Dr. Eldridge Dace and Rolly Salter, PA-C and they recommended discharge with outpatient follow-up.  They altered the patient's medications.  Most recent records show aspirin 81 mg daily, Zetia 10 mg daily, metoprolol succinate 100 mg daily, nitro as needed, Benicar 40 mg daily, rosuvastatin 20 mg daily, Brilinta 90 mg twice a day.  He was seen by me 06/07/22, he tells me that he understands the difference of soreness in his chest and chest pain.  He is only taking 50 mg daily of his metoprolol and his heart rate is in the 90s today.  We have increased to 75 mg daily.  He endorses lightheadedness/dizziness when he stands up too quickly.  Blood pressure is low normal today 106/70.  We discussed reducing his Benicar for more blood pressure room.  He states he is having trouble eating because of his taste buds being affected.  He stopped smoking cigarettes back in 2002 but he has been smoking marijuana for 50+ years.  He is having a hard time with quitting marijuana.  Asking me about edibles today and cardiovascular risk.  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  He is interested in cardiac rehab and we will resend the referral in today.  Today, he ***   Past Medical History    Past Medical History:  Diagnosis Date   Arthritis    Bladder disorder    Cancer (HCC)    hx prostate cancer and CML   Chronic myelocytic leukemia (HCC)    Coronary artery disease    with prior MI 2002(BMS) and Cfx(2006) and RCA (2004) stents . EF 45-50%    Coronary atherosclerosis of native coronary artery    stable and suspect a component of CAS   Diverticulosis    seen on colonoscopy in 2008   Goals of care, counseling/discussion 09/13/2016   H/O colonoscopy 2014   Hyperlipidemia    Hypertension    Myocardial infarction (HCC) 02,6/12   Prostate cancer (HCC)    Past Surgical History:  Procedure Laterality Date   CARDIAC CATHETERIZATION     normal EF, patent stents without obstructive disease (on Plavix X 1 month only)   CAROTID STENT     CORONARY BALLOON ANGIOPLASTY N/A 05/23/2022   Procedure: CORONARY BALLOON ANGIOPLASTY;  Surgeon: Corky Crafts, MD;  [facility];  Service: Cardiovascular;  Laterality: N/A;   CORONARY PRESSURE/FFR STUDY N/A 05/23/2022   Procedure: INTRAVASCULAR PRESSURE WIRE/FFR STUDY;  Surgeon: Corky Crafts, MD;  Location: [facility] INVASIVE CV LAB;  Service: Cardiovascular;  Laterality: N/A;   FRACTURE SURGERY     rt wrist   LEFT HEART CATH AND CORONARY ANGIOGRAPHY N/A 05/23/2022   Procedure: LEFT HEART CATH AND CORONARY ANGIOGRAPHY;  Surgeon: Corky Crafts, MD;  Location: [facility] INVASIVE CV LAB;  Service: Cardiovascular;  Laterality: N/A;   LEFT HEART CATHETERIZATION WITH CORONARY ANGIOGRAM N/A 02/04/2012   Procedure: LEFT HEART CATHETERIZATION WITH CORONARY ANGIOGRAM;  Surgeon: Lesleigh Noe, MD;  Location: [facility] CATH LAB;  Service: Cardiovascular;  Laterality: N/A;   ORIF ANKLE FRACTURE  07/04/2011   Procedure: OPEN REDUCTION INTERNAL FIXATION (ORIF) ANKLE FRACTURE;  Surgeon: Harvie Junior, MD;  Location: [facility];  Service: Orthopedics;  Laterality: Right;   TOE SURGERY     rt great toe   TONSILLECTOMY     WRIST SURGERY     rt and lt cysts removed    Allergies  Allergies  Allergen Reactions   Contrast Media [Iodinated Contrast Media] Hives    "a few hives; not a lot"    EKGs/Labs/Other Studies Reviewed:   The following studies were reviewed today:  Cardiac  cath 05/23/22 Prox RCA lesion is 50% stenosed.  Tortuous portion of the vessel which has progressed since 2013.   Mid LAD lesion is 25% stenosed.   2nd Diag lesion is 25% stenosed.   Prox LAD lesion is 50% stenosed.  RFR 0.95.  This area has progressed since 2013.   Ost Cx to Prox Cx lesion is 90% stenosed.   Scoring balloon angioplasty was performed using a BALLN WOLVERINE 2.50X10.   Post intervention, there is a 10% residual stenosis.   The left ventricular systolic function is normal.   LV end diastolic pressure is normal.   The left ventricular ejection fraction is 55-65% by visual estimate.   There is no aortic valve stenosis.   Tortuosity noted in the right subclavian.  Somewhat difficult to torque catheters without the benefit of a destination sheath which was not available today.   Successful PTCA of the severe in-stent restenosis  of the proximal circumflex stent.  Continue with dual antiplatelet therapy for 12 months.  Continue aggressive secondary prevention.  Results conveyed to the patient's son,  Clearance Coots [id number].  Plan for discharge tomorrow.   Coronary Diagrams  Diagnostic Dominance: Right  Intervention    Implants    EKG:  EKG is not ordered today.    Recent Labs: 05/24/2022: ALT 21; Magnesium 1.9 05/25/2022: BUN 16; Creatinine, Ser 1.15; Hemoglobin 16.2; Platelets 243; Potassium 3.5; Sodium 137  Recent Lipid Panel    Component Value Date/Time   CHOL 104 05/24/2022 0147   CHOL 161 12/19/2017 1059   TRIG 38 05/24/2022 0147   HDL 38 (L) 05/24/2022 0147   HDL 51 12/19/2017 1059   CHOLHDL 2.7 05/24/2022 0147   VLDL 8 05/24/2022 0147   LDLCALC 58 05/24/2022 0147   LDLCALC 97 12/19/2017 1059    Home Medications   No outpatient medications have been marked as taking for the 10/03/22 encounter (Appointment) with Sharlene Dory, PA-C.     Review of Systems      All other systems reviewed and are otherwise negative except as noted above.  Physical Exam     VS:  There were no vitals taken for this visit. , BMI There is no height or weight on file to calculate BMI.  Wt Readings from Last 3 Encounters:  09/25/22 168 lb 9.6 oz (76.5 kg)  09/10/22 169 lb 5 oz (76.8 kg)  08/13/22 168 lb (76.2 kg)     GEN: Well nourished, well developed, in no acute distress. HEENT: normal. Neck: Supple, no JVD, carotid bruits, or masses. Cardiac: RRR, no murmurs, rubs, or gallops. No clubbing, cyanosis, edema.  Radials/PT 2+ and equal bilaterally.  Respiratory:  Respirations regular and unlabored, clear to auscultation bilaterally. GI: Soft, nontender, nondistended. MS: No deformity or atrophy. Skin: Warm and dry, no rash. Neuro:  Strength and sensation are intact. Psych: Normal affect.  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  May be able to return to work on a light-duty basis prior to completion of cardiac rehab  2. HLD -LDL 58, HDL 38, total cholesterol 474, triglycerides 38 -All numbers are at goal, continue current medications  3. HTN -Low normal today so we have decreased his Benicar -continue low sodium diet -continue to track BP at home  4. History of prostate cancer and CML in remission -Not addressed today  No BP recorded.  {Refresh Note OR Click here to enter BP  :1}***      Disposition: Follow up 3-4 months with Lance Muss, MD or APP.  Signed, Sharlene Dory, PA-C 10/02/2022, 8:23 PM [facility]

## 2022-10-03 ENCOUNTER — Ambulatory Visit: Payer: PPO | Attending: Physician Assistant | Admitting: Physician Assistant

## 2022-10-03 DIAGNOSIS — I214 Non-ST elevation (NSTEMI) myocardial infarction: Secondary | ICD-10-CM

## 2022-10-03 DIAGNOSIS — Z8546 Personal history of malignant neoplasm of prostate: Secondary | ICD-10-CM

## 2022-10-03 DIAGNOSIS — E785 Hyperlipidemia, unspecified: Secondary | ICD-10-CM

## 2022-10-03 DIAGNOSIS — I10 Essential (primary) hypertension: Secondary | ICD-10-CM

## 2022-10-03 NOTE — Progress Notes (Signed)
[facility] PREP Weekly Session  Patient Details  Name: ALECXANDER EDWARD MRN: [id number] Date of Birth: [birth year] Age: 67 y.o. PCP: Sherrie Mustache T, PA-C  There were no vitals filed for this visit.   [facility] Weekly seesion - 10/03/22 1500       [facility] "PREP" Location   [facility] "PREP" Location [facility]      Weekly Session   Topic Discussed Goal setting and welcome to the program   Fit testing   Classes attended to date 2             Bonnye Fava 10/03/2022, 3:32 PM

## 2022-10-18 NOTE — Progress Notes (Signed)
[facility] PREP Weekly Session  Patient Details  Name: CARLTON MACER MRN: [id number] Date of Birth: 11-Jun-1955 Age: 67 y.o. PCP: Virgilio Belling, PA-C  Vitals:   10/15/22 1030  Weight: 169 lb (76.7 kg)     [facility] Weekly seesion - 10/18/22 1600       [facility] "PREP" Location   [facility] "PREP" Location [facility]      Weekly Session   Topic Discussed Healthy eating tips    Minutes exercised this week 40 minutes    Classes attended to date 4             Bonnye Fava 10/18/2022, 4:56 PM

## 2022-10-23 NOTE — Progress Notes (Signed)
[facility] PREP Weekly Session  Patient Details  Name: [name] MRN: [id number] Date of Birth: 03-11-1955 Age: 67 y.o. PCP: Virgilio Belling, PA-C  Vitals:   10/22/22 1030  Weight: 170 lb 9.6 oz (77.4 kg)     [facility] Weekly seesion - 10/23/22 1600       [facility] "PREP" Location   [facility] "PREP" Location [facility]      Weekly Session   Topic Discussed Health habits    Minutes exercised this week 120 minutes    Classes attended to date 5             Bonnye Fava 10/23/2022, 4:45 PM

## 2022-10-31 NOTE — Progress Notes (Signed)
[facility] PREP Weekly Session  Patient Details  Name: [name] MRN: [id number] Date of Birth: [birth year] Age: 67 y.o. PCP: Virgilio Belling, PA-C  Vitals:   10/29/22 1030  Weight: 172 lb (78 kg)     [facility] Weekly seesion - 10/31/22 1400       [facility] "PREP" Location   [facility]P" [facility]      Weekly Session   Topic Discussed Restaurant Eating   Salt and sugar demos   Minutes exercised this week 70 minutes    Classes attended to date 8             Pam Jerral Bonito 10/31/2022, 3:00 PM

## 2022-11-13 NOTE — Progress Notes (Signed)
[facility] PREP Weekly Session  Patient Details  Name: [name] MRN: [id number] Date of Birth: 1955/06/04 Age: 67 y.o. PCP: Virgilio Belling, PA-C  Vitals:   11/12/22 1030  Weight: 173 lb 12.8 oz (78.8 kg)     [facility] Weekly seesion - 11/13/22 1100       [facility] "PREP" Location   [facility] "PREP" Location [facility]      Weekly Session   Topic Discussed Stress management and problem solving   relaxation meditation   Minutes exercised this week 90 minutes    Classes attended to date 3             Pam Jerral Bonito 11/13/2022, 11:31 AM

## 2022-12-03 NOTE — Progress Notes (Signed)
[facility] PREP Weekly Session  Patient Details  Name: [name] MRN: [id number] Date of Birth: 08-23-1955 Age: 67 y.o. PCP: Virgilio Belling, PA-C  Vitals:   12/03/22 1030  Weight: 176 lb (79.8 kg)     [facility] Weekly seesion - 12/03/22 1300       [facility] "PREP" Location   [facility]      Weekly Session   Topic Discussed Water;Eating for the season    Minutes exercised this week 120 minutes    Classes attended to date 12             Bonnye Fava 12/03/2022, 1:13 PM

## 2022-12-25 NOTE — Progress Notes (Signed)
[facility] PREP Weekly Session  Patient Details  Name: [name] MRN: [id number] Date of Birth: Mar 06, 1955 Age: 67 y.o. PCP: Virgilio Belling, PA-C  Vitals:   12/24/22 1030  Weight: 171 lb 9.6 oz (77.8 kg)     [facility] Weekly seesion - 12/25/22 1200       [facility] "PREP" Location   [facility] "PREP" Location [facility]      Weekly Session   Topic Discussed Calorie breakdown    Minutes exercised this week 285 minutes    Classes attended to date 15             Bonnye Fava 12/25/2022, 12:10 PM

## 2022-12-31 NOTE — Progress Notes (Signed)
[facility] PREP Weekly Session  Patient Details  Name: [name] MRN: [id number] Date of Birth: 1955-09-10 Age: 67 y.o. PCP: Virgilio Belling, PA-C  Vitals:   12/31/22 1030  Weight: 171 lb 9.6 oz (77.8 kg)     [facility] Weekly seesion - 12/31/22 1500       [facility] "PREP" Location   [facility] "PREP" Location [facility]      Weekly Session   Topic Discussed Hitting roadblocks   goal setting   Minutes exercised this week 485 minutes    Classes attended to date 17             Bonnye Fava 12/31/2022, 3:17 PM

## 2023-01-23 NOTE — Progress Notes (Signed)
[facility] PREP Evaluation  Patient Details  Name: [name] MRN: [id number] Date of Birth: [birth year] Age: 67 y.o. PCP: Sherrie Mustache T, PA-C  There were no vitals filed for this visit.   [facility] Eval - 01/23/23 1000       [facility] "PREP" Location   [facility] "PREP" Location [facility]      Referral    Referring Provider [place]    Program Start Date 10/01/22    Program End Date 01/07/23      Mobility and Daily Activities   I find it easy to walk up or down two or more flights of stairs. 4    I have no trouble taking out the trash. 4    I do housework such as vacuuming and dusting on my own without difficulty. 4    I can easily lift a gallon of milk (8lbs). 4    I can easily walk a mile. 4    I have no trouble reaching into high cupboards or reaching down to pick up something from the floor. 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 1      Mobility and Daily Activities   I feel younger than my age. 3    I feel independent. 4    I feel energetic. 3    I live an active life.  4    I feel strong. 3    I feel healthy. 2    I feel active as other people my age. 4      How fit and strong are you.   Fit and Strong Total Score 48            Past Medical History:  Diagnosis Date   Arthritis    Bladder disorder    Cancer (HCC)    hx prostate cancer and CML   Chronic myelocytic leukemia (HCC)    Coronary artery disease    with prior MI 2002(BMS) and Cfx(2006) and RCA (2004) stents . EF 45-50%   Coronary atherosclerosis of native coronary artery    stable and suspect a component of CAS   Diverticulosis    seen on colonoscopy in 2008   Goals of care, counseling/discussion 09/13/2016   H/O colonoscopy 2014   Hyperlipidemia    Hypertension    Myocardial infarction (HCC) 02,6/12   Prostate cancer (HCC)    Past Surgical History:  Procedure Laterality Date   CARDIAC CATHETERIZATION     normal EF, patent  stents without obstructive disease (on Plavix X 1 month only)   CAROTID STENT     CORONARY BALLOON ANGIOPLASTY N/A 05/23/2022   Procedure: CORONARY BALLOON ANGIOPLASTY;  Surgeon: Corky Crafts, MD;  [facility]: [facility] INVASIVE CV LAB;  Service: Cardiovascular;  Laterality: N/A;   CORONARY PRESSURE/FFR STUDY N/A 05/23/2022   Procedure: INTRAVASCULAR PRESSURE WIRE/FFR STUDY;  Surgeon: Corky Crafts, MD;  Location: [facility] INVASIVE CV LAB;  Service: Cardiovascular;  Laterality: N/A;   FRACTURE SURGERY     rt wrist   LEFT HEART CATH AND CORONARY ANGIOGRAPHY N/A 05/23/2022   Procedure: LEFT HEART CATH AND CORONARY ANGIOGRAPHY;  Surgeon: Corky Crafts, MD;  Location: [facility] INVASIVE CV LAB;  Service: Cardiovascular;  Laterality: N/A;   LEFT HEART CATHETERIZATION WITH CORONARY ANGIOGRAM N/A 02/04/2012   Procedure: LEFT HEART CATHETERIZATION WITH CORONARY ANGIOGRAM;  Surgeon: Lesleigh Noe, MD;  Location: [facility] CATH LAB;  Service:  Cardiovascular;  Laterality: N/A;   ORIF ANKLE FRACTURE  07/04/2011   Procedure: OPEN REDUCTION INTERNAL FIXATION (ORIF) ANKLE FRACTURE;  Surgeon: Harvie Junior, MD;  Location: [facility];  Service: Orthopedics;  Laterality: Right;   TOE SURGERY     rt great toe   TONSILLECTOMY     WRIST SURGERY     rt and lt cysts removed   Social History   Tobacco Use  Smoking Status Former   Current packs/day: 0.00   Types: Cigarettes   Quit date: 06/06/2000   Years since quitting: 22.6  Smokeless Tobacco Never  Tobacco Comments   2002  Was scheduled to complete final measurements today however has developed an illness and is unable to attend.  Attended 20 sessions, 9 educational sessions  Cardio march: 139 to 220 Sit to stand: 17 to 24 Bicep curl: 16 to 21 Balance remains good  Pam M Caven Perine 01/23/2023, 10:14 AM

## 2023-07-25 ENCOUNTER — Encounter (HOSPITAL_BASED_OUTPATIENT_CLINIC_OR_DEPARTMENT_OTHER): Payer: Self-pay

## 2023-07-25 ENCOUNTER — Other Ambulatory Visit: Payer: Self-pay

## 2023-07-25 ENCOUNTER — Inpatient Hospital Stay (HOSPITAL_BASED_OUTPATIENT_CLINIC_OR_DEPARTMENT_OTHER)
Admission: EM | Admit: 2023-07-25 | Discharge: 2023-07-27 | DRG: 392 | Disposition: A | Discharge status: Home / Self Care | Attending: Family Medicine | Admitting: Family Medicine

## 2023-07-25 ENCOUNTER — Emergency Department (HOSPITAL_BASED_OUTPATIENT_CLINIC_OR_DEPARTMENT_OTHER)

## 2023-07-25 DIAGNOSIS — Z7982 Long term (current) use of aspirin: Secondary | ICD-10-CM | POA: Diagnosis not present

## 2023-07-25 DIAGNOSIS — I10 Essential (primary) hypertension: Secondary | ICD-10-CM | POA: Diagnosis present

## 2023-07-25 DIAGNOSIS — K5792 Diverticulitis of intestine, part unspecified, without perforation or abscess without bleeding: Secondary | ICD-10-CM | POA: Diagnosis present

## 2023-07-25 DIAGNOSIS — Z7985 Long-term (current) use of injectable non-insulin antidiabetic drugs: Secondary | ICD-10-CM | POA: Diagnosis not present

## 2023-07-25 DIAGNOSIS — Z91041 Radiographic dye allergy status: Secondary | ICD-10-CM | POA: Diagnosis not present

## 2023-07-25 DIAGNOSIS — C9210 Chronic myeloid leukemia, BCR/ABL-positive, not having achieved remission: Secondary | ICD-10-CM | POA: Diagnosis present

## 2023-07-25 DIAGNOSIS — I2510 Atherosclerotic heart disease of native coronary artery without angina pectoris: Secondary | ICD-10-CM | POA: Diagnosis present

## 2023-07-25 DIAGNOSIS — K5720 Diverticulitis of large intestine with perforation and abscess without bleeding: Principal | ICD-10-CM | POA: Diagnosis present

## 2023-07-25 DIAGNOSIS — K5780 Diverticulitis of intestine, part unspecified, with perforation and abscess without bleeding: Principal | ICD-10-CM

## 2023-07-25 DIAGNOSIS — Z79899 Other long term (current) drug therapy: Secondary | ICD-10-CM | POA: Diagnosis not present

## 2023-07-25 DIAGNOSIS — M549 Dorsalgia, unspecified: Secondary | ICD-10-CM | POA: Diagnosis present

## 2023-07-25 DIAGNOSIS — E118 Type 2 diabetes mellitus with unspecified complications: Secondary | ICD-10-CM | POA: Diagnosis present

## 2023-07-25 DIAGNOSIS — N179 Acute kidney failure, unspecified: Secondary | ICD-10-CM | POA: Diagnosis present

## 2023-07-25 DIAGNOSIS — Z800 Family history of malignant neoplasm of digestive organs: Secondary | ICD-10-CM

## 2023-07-25 DIAGNOSIS — Z8546 Personal history of malignant neoplasm of prostate: Secondary | ICD-10-CM | POA: Diagnosis not present

## 2023-07-25 DIAGNOSIS — I252 Old myocardial infarction: Secondary | ICD-10-CM | POA: Diagnosis not present

## 2023-07-25 DIAGNOSIS — Z955 Presence of coronary angioplasty implant and graft: Secondary | ICD-10-CM | POA: Diagnosis not present

## 2023-07-25 DIAGNOSIS — E785 Hyperlipidemia, unspecified: Secondary | ICD-10-CM | POA: Diagnosis present

## 2023-07-25 DIAGNOSIS — Z9861 Coronary angioplasty status: Secondary | ICD-10-CM | POA: Diagnosis not present

## 2023-07-25 DIAGNOSIS — Z87891 Personal history of nicotine dependence: Secondary | ICD-10-CM | POA: Diagnosis not present

## 2023-07-25 DIAGNOSIS — Z7902 Long term (current) use of antithrombotics/antiplatelets: Secondary | ICD-10-CM | POA: Diagnosis not present

## 2023-07-25 DIAGNOSIS — I700 Atherosclerosis of aorta: Secondary | ICD-10-CM | POA: Diagnosis present

## 2023-07-25 DIAGNOSIS — R7989 Other specified abnormal findings of blood chemistry: Secondary | ICD-10-CM | POA: Diagnosis present

## 2023-07-25 DIAGNOSIS — Z9079 Acquired absence of other genital organ(s): Secondary | ICD-10-CM | POA: Diagnosis not present

## 2023-07-25 LAB — CBC
HCT: 42.1 % (ref 39.0–52.0)
Hemoglobin: 14.6 g/dL (ref 13.0–17.0)
MCH: 31.9 pg (ref 26.0–34.0)
MCHC: 34.7 g/dL (ref 30.0–36.0)
MCV: 91.9 fL (ref 80.0–100.0)
Platelets: 258 10*3/uL (ref 150–400)
RBC: 4.58 MIL/uL (ref 4.22–5.81)
RDW: 14.4 % (ref 11.5–15.5)
WBC: 11.6 10*3/uL — ABNORMAL HIGH (ref 4.0–10.5)
nRBC: 0 % (ref 0.0–0.2)

## 2023-07-25 LAB — URINALYSIS, ROUTINE W REFLEX MICROSCOPIC
Bilirubin Urine: NEGATIVE
Glucose, UA: NEGATIVE mg/dL
Hgb urine dipstick: NEGATIVE
Leukocytes,Ua: NEGATIVE
Nitrite: NEGATIVE
Specific Gravity, Urine: 1.029 (ref 1.005–1.030)
pH: 5.5 (ref 5.0–8.0)

## 2023-07-25 LAB — BASIC METABOLIC PANEL WITH GFR
Anion gap: 11 (ref 5–15)
BUN: 17 mg/dL (ref 8–23)
CO2: 24 mmol/L (ref 22–32)
Calcium: 9.3 mg/dL (ref 8.9–10.3)
Chloride: 107 mmol/L (ref 98–111)
Creatinine, Ser: 1.52 mg/dL — ABNORMAL HIGH (ref 0.61–1.24)
GFR, Estimated: 50 mL/min — ABNORMAL LOW (ref >60)
Glucose, Bld: 120 mg/dL — ABNORMAL HIGH (ref 70–99)
Potassium: 4.7 mmol/L (ref 3.5–5.1)
Sodium: 141 mmol/L (ref 135–145)

## 2023-07-25 MED ORDER — MORPHINE SULFATE (PF) 4 MG/ML IV SOLN
4.0000 mg | Freq: Once | INTRAVENOUS | Status: AC
  Administered 2023-07-25: 4 mg via INTRAVENOUS
  Filled 2023-07-25: qty 1

## 2023-07-25 MED ORDER — SODIUM CHLORIDE 0.9 % IV SOLN
2.0000 g | Freq: Once | INTRAVENOUS | Status: AC
  Administered 2023-07-25: 2 g via INTRAVENOUS
  Filled 2023-07-25: qty 20

## 2023-07-25 MED ORDER — KETOROLAC TROMETHAMINE 15 MG/ML IJ SOLN
15.0000 mg | Freq: Once | INTRAMUSCULAR | Status: AC
  Administered 2023-07-25: 15 mg via INTRAVENOUS
  Filled 2023-07-25: qty 1

## 2023-07-25 MED ORDER — SODIUM CHLORIDE 0.9 % IV BOLUS
1000.0000 mL | Freq: Once | INTRAVENOUS | Status: AC
  Administered 2023-07-25: 1000 mL via INTRAVENOUS

## 2023-07-25 MED ORDER — METRONIDAZOLE 500 MG/100ML IV SOLN
500.0000 mg | Freq: Once | INTRAVENOUS | Status: AC
  Administered 2023-07-25: 500 mg via INTRAVENOUS
  Filled 2023-07-25: qty 100

## 2023-07-25 NOTE — Hospital Course (Addendum)
 [name] is a  68 year old man history of CAD, hypertension, and hyperlipidemia presented to emergency department complaining for left-sided flank pain sudden onset at 1pm today. No urinary symptoms. No history of kidney stones. Nothing taken for pain prior to arrival. Does some lifting for work but denies any recent lifting today.  No recent diarrhea, no fever, chills.   ED:  patient is hemodynamically stable. CBC showing leukocytosis 11.6 otherwise unremarkable. BMP unremarkable except elevated creatinine 1.52. UA showing positive ketone and protein positive no evidence of UTI.  CT abdomen pelvis showed: Diverticulitis of the distal descending and proximal sigmoid colon. There are questionable foci of free air adjacent to the colon in the left lower quadrant, possible microperforations. No abscess is seen. 2. Hyperdense material the renal pyramids bilaterally, possible medullary calcinosis. No obstructive uropathy bilaterally. 3. Aortic atherosclerosis and coronary artery calcification.  In the ED patient has been treated with ceftriaxone, metronidazole, morphine , Toradol  and 1 L of NS bolus.  ED physician discussed case with general surgery Dr. Elvan Hamel he was not very impressed regarding the microperforation recommended to consult if needed.  Hospitalist has been consulted for further management of diverticulitis with microperforation.    Assessment & Plan:   Principal Problem:   Acute diverticulitis Active Problems:   Type 2 diabetes mellitus with complication, without long-term current use of insulin (HCC)   Essential hypertension   Hyperlipidemia with target low density lipoprotein (LDL) cholesterol less than 70 mg/dL   Chronic myeloid leukemia (HCC)   History of prostate cancer   CAD S/P percutaneous coronary angioplasty       Acute diverticulitis  - Hemodynamically stable, no signs of sepsis, continue to have abdominal pain  Diverticulitis of the descending  colon and proximal sigmoid colon with possible microperforation  - Will continue empiric IV antibiotics -Surgery consulted, appreciate further evaluation and input  -N.p.o. - Gentle IV fluids.  IV pain medications.    -Patient has had a colonoscopy in June 2021 at [facility].  CAD status post stenting/with extensive history of MIs in the past -Denies of having any chest pain or shortness of breath -Last Echo in 2024 March.   Takes aspirin  Repatha statins beta-blockers.   NPO.  Hypertension  -will keep patient on as needed IV hydralazine .   Takes metoprolol  and Benicar  at home.  Acute renal failure  -resolved, responded to IVF  History of CML  -follows with oncologist at [facility].  On Scemblix 40 mg twice daily.  Type 2 diabetes mentioned in the chart.   -A1c.  Patient is not on any medications.

## 2023-07-25 NOTE — ED Provider Notes (Signed)
  Physical Exam  BP (!) 140/96   Pulse 81   Temp 98.5 F (36.9 C)   Resp 18   Ht 5\' 8"  (1.727 m)   Wt 77.9 kg   SpO2 97%   BMI 26.11 kg/m   Physical Exam  Procedures  Procedures  ED Course / MDM    Medical Decision Making Amount and/or Complexity of Data Reviewed Labs: ordered. Radiology: ordered.  Risk Prescription drug management.   Patient taken at shift handoff.  Needs admission for acute diverticulitis with microperforation.  Case discussed with Dr. Sendil who will place a bed request for the patient for admission.  He is currently on IV antibiotics pain is well-controlled       Tama Fails, PA-C 07/25/23 2130    Tonya Fredrickson, MD 07/26/23 1006

## 2023-07-25 NOTE — ED Provider Notes (Signed)
 [place] EMERGENCY DEPARTMENT AT [facility] Provider Note   CSN: [id number] Arrival date & time: 07/25/23  1557     History  Chief Complaint  Patient presents with   Flank Pain    Left    Back Pain    AYDIN HINK is a 68 y.o. male with past medical history significant for HLD, CAD, HTN, MI, who presents with concern for left flank pain, sudden onset at 1pm today. No urinary symptoms. No history of kidney stones. Nothing taken for pain prior to arrival. Does some lifting for work but denies any recent lifting today.  No recent diarrhea, no fever, chills.   Flank Pain  Back Pain      Home Medications Prior to Admission medications   Medication Sig Start Date End Date Taking? Authorizing Provider  acetaminophen  (TYLENOL ) 325 MG tablet Take 2 tablets (650 mg total) by mouth every 4 (four) hours as needed for headache or mild pain. 05/24/22   Aura Leeds Latif, DO  amoxicillin  (AMOXIL ) 500 MG capsule Take 2 capsules (1,000 mg total) by mouth 2 (two) times daily. 08/13/22   Albertus Hughs, DO  Ascorbic Acid (VITAMIN C) 1000 MG tablet Take 1,000 mg by mouth daily.    [provider]  aspirin  81 MG chewable tablet Chew 1 tablet (81 mg total) by mouth daily. 05/25/22   Sheikh, Omair Latif, DO  cholecalciferol (VITAMIN D) 1000 UNITS tablet Take 5,000 Units by mouth daily.     [provider]  Coenzyme Q10 (COQ10) 100 MG CAPS Take 100 mg by mouth daily. Per patient taking 200 mg    [provider]  cyclobenzaprine  (FLEXERIL ) 10 MG tablet Take 10 mg by mouth at bedtime as needed.    [provider]  darifenacin  (ENABLEX ) 15 MG 24 hr tablet Take 15 mg by mouth daily.     [provider]  DEXILANT 60 MG capsule TAKE 1 CAPSULE BY MOUTH ONCE DAILY 01/03/18   Cleave Curling, MD  ezetimibe  (ZETIA ) 10 MG tablet Take 10 mg by mouth daily. 01/16/22   [provider]  famotidine  (PEPCID ) 20 MG tablet Take 20 mg by mouth 2 (two) times  daily. Patient not taking: Reported on 09/03/2022 05/22/22 05/22/23  [provider]  hydrOXYzine  (ATARAX /VISTARIL ) 25 MG tablet Take 1 tablet (25 mg total) by mouth 2 (two) times daily. 12/05/17   Cleave Curling, MD  isosorbide  mononitrate (IMDUR ) 60 MG 24 hr tablet TAKE 1 TABLET BY MOUTH ONCE DAILY 05/27/17   Arty Binning, MD  meloxicam (MOBIC) 15 MG tablet Take 1 tablet by mouth as needed. 03/14/22   [provider]  metoprolol  succinate (TOPROL -XL) 50 MG 24 hr tablet Take 1.5 tablets (75 mg total) by mouth daily. Take with or immediately following a meal. 06/07/22   Von Grumbling, PA-C  MYRBETRIQ  50 MG TB24 tablet Take 50 mg by mouth daily. 12/25/21   [provider]  nitroGLYCERIN  (NITROSTAT ) 0.4 MG SL tablet Place 1 tablet (0.4 mg total) under the tongue every 5 (five) minutes as needed for chest pain. 05/24/22   Aura Leeds Latif, DO  olmesartan  (BENICAR ) 20 MG tablet Take 2 tablets (40 mg total) by mouth daily. 06/07/22   Von Grumbling, PA-C  Omega-3 Fatty Acids (FISH OIL PO) Take by mouth.    [provider]  OZEMPIC, 0.25 OR 0.5 MG/DOSE, 2 MG/3ML SOPN Inject 0.5 mg into the skin every 14 (fourteen) days. Pt stated PCP changed ozempic to  every 2 weeks. 12/13/21   [provider]  rosuvastatin  (CRESTOR ) 40 MG tablet Take 1 tablet (40 mg total) by mouth daily. 06/21/22   Lucendia Rusk, MD  ticagrelor  (BRILINTA ) 90 MG TABS tablet Take 1 tablet (90 mg total) by mouth 2 (two) times daily. Patient taking differently: Take 90 mg by mouth 2 (two) times daily. Per patient taking once daily. Will start taking twice a day 05/24/22   Sheikh, Omair Latif, DO  VITAMIN E PO Take 1 tablet by mouth daily.    [provider]      Allergies    Contrast media [iodinated contrast media]    Review of Systems   Review of Systems  Genitourinary:  Positive for flank pain.  Musculoskeletal:  Positive for back pain.  All other systems reviewed and are  negative.   Physical Exam Updated Vital Signs BP (!) 140/96   Pulse 81   Temp 98.5 F (36.9 C)   Resp 18   Ht 5\' 8"  (1.727 m)   Wt 77.9 kg   SpO2 97%   BMI 26.11 kg/m  Physical Exam Vitals and nursing note reviewed.  Constitutional:      General: He is not in acute distress.    Appearance: Normal appearance.  HENT:     Head: Normocephalic and atraumatic.  Eyes:     General:        Right eye: No discharge.        Left eye: No discharge.  Cardiovascular:     Rate and Rhythm: Normal rate and regular rhythm.     Heart sounds: No murmur heard.    No friction rub. No gallop.  Pulmonary:     Effort: Pulmonary effort is normal.     Breath sounds: Normal breath sounds.  Abdominal:     General: Bowel sounds are normal.     Palpations: Abdomen is soft.     Comments: Focal left CVA tenderness, mild left lower quadrant ttp, no rebound, rigidity, guarding  Skin:    General: Skin is warm and dry.     Capillary Refill: Capillary refill takes less than 2 seconds.  Neurological:     Mental Status: He is alert and oriented to person, place, and time.  Psychiatric:        Mood and Affect: Mood normal.        Behavior: Behavior normal.     ED Results / Procedures / Treatments   Labs (all labs ordered are listed, but only abnormal results are displayed) Labs Reviewed  URINALYSIS, ROUTINE W REFLEX MICROSCOPIC - Abnormal; Notable for the following components:      Result Value   Ketones, ur TRACE (*)    Protein, ur TRACE (*)    All other components within normal limits  CBC - Abnormal; Notable for the following components:   WBC 11.6 (*)    All other components within normal limits  BASIC METABOLIC PANEL WITH GFR - Abnormal; Notable for the following components:   Glucose, Bld 120 (*)    Creatinine, Ser 1.52 (*)    GFR, Estimated 50 (*)    All other components within normal limits    EKG None  Radiology CT Renal Stone Study Addendum Date: 07/25/2023 ADDENDUM REPORT:  07/25/2023 19:46 ADDENDUM: Critical Value/emergent results were called by telephone at the time of interpretation on 07/25/2023 at 7:45 pm to provider [facility] , who verbally acknowledged these results. Electronically Signed   By: Dorcus Gamble.D.  On: 07/25/2023 19:46   Result Date: 07/25/2023 CLINICAL DATA:  Left abdominal/flank pain, stone suspected. EXAM: CT ABDOMEN AND PELVIS WITHOUT CONTRAST TECHNIQUE: Multidetector CT imaging of the abdomen and pelvis was performed following the standard protocol without IV contrast. RADIATION DOSE REDUCTION: This exam was performed according to the departmental dose-optimization program which includes automated exposure control, adjustment of the mA and/or kV according to patient size and/or use of iterative reconstruction technique. COMPARISON:  01/27/2020. FINDINGS: Lower chest: Coronary artery calcifications are present. The lung bases are clear. Hepatobiliary: No focal liver abnormality is seen. No gallstones, gallbladder wall thickening, or biliary dilatation. Pancreas: Unremarkable. No pancreatic ductal dilatation or surrounding inflammatory changes. Spleen: Normal in size without focal abnormality. Adrenals/Urinary Tract: Adrenal glands are within normal limits. Hyperdense regions are present at the medullary pyramids. No obstructive uropathy is seen bilaterally. The bladder is unremarkable. Stomach/Bowel: Stomach is within normal limits. Appendix appears normal. Scattered diverticula are present along the colon. There is pericolonic fat stranding involving the distal descending colon and proximal sigmoid colon, compatible with diverticulitis. There are a few questionable foci of free air adjacent to the sigmoid colon in the left lower quadrant, possible micro perforation. No abscess is seen. Vascular/Lymphatic: Aortic atherosclerosis. No enlarged abdominal or pelvic lymph nodes. Reproductive: Prostate gland is not visualized. Other: No abdominopelvic  ascites. Fat containing umbilical hernia is present. Musculoskeletal: Degenerative changes are noted in the thoracolumbar spine. No acute osseous abnormality. IMPRESSION: 1. Findings compatible with diverticulitis involving the distal descending colon and proximal sigmoid colon. There are questionable foci of free air adjacent to the colon in the left lower quadrant, possible microperforations. No abscess is seen. 2. Hyperdense material the renal pyramids bilaterally, possible medullary calcinosis. No obstructive uropathy bilaterally. 3. Aortic atherosclerosis and coronary artery calcification. Electronically Signed: By: Wyvonnia Heimlich M.D. On: 07/25/2023 19:40    Procedures Procedures    Medications Ordered in ED Medications  cefTRIAXone (ROCEPHIN) 2 g in sodium chloride  0.9 % 100 mL IVPB (has no administration in time range)    And  metroNIDAZOLE (FLAGYL) IVPB 500 mg (500 mg Intravenous New Bag/Given 07/25/23 2027)  ketorolac  (TORADOL ) 15 MG/ML injection 15 mg (15 mg Intravenous Given 07/25/23 1835)  morphine  (PF) 4 MG/ML injection 4 mg (4 mg Intravenous Given 07/25/23 1835)  sodium chloride  0.9 % bolus 1,000 mL (1,000 mLs Intravenous New Bag/Given 07/25/23 2025)    ED Course/ Medical Decision Making/ A&P                                 Medical Decision Making Amount and/or Complexity of Data Reviewed Labs: ordered. Radiology: ordered.  Risk Prescription drug management.   This patient is a 68 y.o. male  who presents to the ED for concern of flank pain.   Differential diagnoses prior to evaluation: The emergent differential diagnosis includes, but is not limited to,  AAA, renal vascular thrombosis, mesenteric ischemia, pyelonephritis, nephrolithiasis, cystitis, biliary colic, pancreatitis, PUD, appendicitis, diverticulitis, bowel obstruction . This is not an exhaustive differential.   Past Medical History / Co-morbidities / Social History: Hypertension, hyperlipidemia, ACS,  diabetes  Physical Exam: Physical exam performed. The pertinent findings include: Some hypertension the emergency department, blood pressure 140/96, vital signs otherwise stable.    Lab Tests/Imaging studies: I personally interpreted labs/imaging and the pertinent results include:  CBC with leukocytosis, blood cells 11.6, BMP shows mild AKI, creatinine 1.52, otherwise overall unremarkable, UA with trace  ketones and protein, no hemoglobin.  I independently interpreted CT renal stone study which shows acute diverticulitis with concern for some Eicher perforation, no abscess or large volume gas noted. I agree with the radiologist interpretation.   Medications: I ordered medication including fluid bolus, Toradol , morphine  for pain, Flagyl, Rocephin for acute diverticulitis with microperforation.  I have reviewed the patients home medicines and have made adjustments as needed.  Consults: Spoke with the general surgeon, Dr. Elvan Hamel who agrees to consult on this patient but request that we reconsult him only if surgery is needed/when he arrives to the hospital.  Does not recommend any emergent surgical evaluation agrees with plan for antibiotics and medical management at this time.   9:27 PM Care of CHIKE FARRINGTON transferred to PA Tama Fails and Dr. Randal Bury at the end of my shift as the patient will require reassessment once labs/imaging have resulted. Patient presentation, ED course, and plan of care discussed with review of all pertinent labs and imaging. Please see his/her note for further details regarding further ED course and disposition. Plan at time of handoff is admit for diverticulitis with microperforation. This may be altered or completely changed at the discretion of the oncoming team pending results of further workup. Final Clinical Impression(s) / ED Diagnoses Final diagnoses:  None    Rx / DC Orders ED Discharge Orders     None         Stefan Edge 07/25/23  2127    Tonya Fredrickson, MD 07/26/23 1006

## 2023-07-25 NOTE — ED Triage Notes (Signed)
 Arrives with complaints of left side flank/back pain x1 day. Rates pain a 9/10. No recent back injuries.

## 2023-07-26 ENCOUNTER — Inpatient Hospital Stay (HOSPITAL_COMMUNITY)

## 2023-07-26 ENCOUNTER — Encounter (HOSPITAL_COMMUNITY): Payer: Self-pay | Admitting: Internal Medicine

## 2023-07-26 DIAGNOSIS — I10 Essential (primary) hypertension: Secondary | ICD-10-CM

## 2023-07-26 DIAGNOSIS — K5792 Diverticulitis of intestine, part unspecified, without perforation or abscess without bleeding: Secondary | ICD-10-CM | POA: Diagnosis not present

## 2023-07-26 DIAGNOSIS — Z9861 Coronary angioplasty status: Secondary | ICD-10-CM | POA: Diagnosis not present

## 2023-07-26 DIAGNOSIS — I2510 Atherosclerotic heart disease of native coronary artery without angina pectoris: Secondary | ICD-10-CM | POA: Diagnosis not present

## 2023-07-26 LAB — CBC WITH DIFFERENTIAL/PLATELET
Abs Immature Granulocytes: 0.07 10*3/uL (ref 0.00–0.07)
Basophils Absolute: 0.1 10*3/uL (ref 0.0–0.1)
Basophils Relative: 1 %
Eosinophils Absolute: 0.1 10*3/uL (ref 0.0–0.5)
Eosinophils Relative: 1 %
HCT: 41.2 % (ref 39.0–52.0)
Hemoglobin: 13.9 g/dL (ref 13.0–17.0)
Immature Granulocytes: 1 %
Lymphocytes Relative: 27 %
Lymphs Abs: 2.9 10*3/uL (ref 0.7–4.0)
MCH: 32.2 pg (ref 26.0–34.0)
MCHC: 33.7 g/dL (ref 30.0–36.0)
MCV: 95.4 fL (ref 80.0–100.0)
Monocytes Absolute: 0.9 10*3/uL (ref 0.1–1.0)
Monocytes Relative: 8 %
Neutro Abs: 6.7 10*3/uL (ref 1.7–7.7)
Neutrophils Relative %: 62 %
Platelets: 232 10*3/uL (ref 150–400)
RBC: 4.32 MIL/uL (ref 4.22–5.81)
RDW: 14.3 % (ref 11.5–15.5)
WBC: 10.6 10*3/uL — ABNORMAL HIGH (ref 4.0–10.5)
nRBC: 0 % (ref 0.0–0.2)

## 2023-07-26 LAB — GLUCOSE, CAPILLARY
Glucose-Capillary: 103 mg/dL — ABNORMAL HIGH (ref 70–99)
Glucose-Capillary: 99 mg/dL (ref 70–99)
Glucose-Capillary: 157 mg/dL — ABNORMAL HIGH (ref 70–99)
Glucose-Capillary: 88 mg/dL (ref 70–99)

## 2023-07-26 LAB — HEPATIC FUNCTION PANEL
ALT: 16 U/L (ref 0–44)
AST: 14 U/L — ABNORMAL LOW (ref 15–41)
Albumin: 3.3 g/dL — ABNORMAL LOW (ref 3.5–5.0)
Alkaline Phosphatase: 51 U/L (ref 38–126)
Bilirubin, Direct: 0.1 mg/dL (ref 0.0–0.2)
Indirect Bilirubin: 0.2 mg/dL — ABNORMAL LOW (ref 0.3–0.9)
Total Bilirubin: 0.3 mg/dL (ref 0.0–1.2)
Total Protein: 6 g/dL — ABNORMAL LOW (ref 6.5–8.1)

## 2023-07-26 LAB — BASIC METABOLIC PANEL WITH GFR
Anion gap: 5 (ref 5–15)
BUN: 20 mg/dL (ref 8–23)
CO2: 25 mmol/L (ref 22–32)
Calcium: 8.4 mg/dL — ABNORMAL LOW (ref 8.9–10.3)
Chloride: 109 mmol/L (ref 98–111)
Creatinine, Ser: 1.1 mg/dL (ref 0.61–1.24)
GFR, Estimated: >60 mL/min (ref >60)
Glucose, Bld: 93 mg/dL (ref 70–99)
Potassium: 4.3 mmol/L (ref 3.5–5.1)
Sodium: 139 mmol/L (ref 135–145)

## 2023-07-26 LAB — HEMOGLOBIN A1C
Hgb A1c MFr Bld: 5.6 % (ref 4.8–5.6)
Mean Plasma Glucose: 114.02 mg/dL

## 2023-07-26 LAB — HIV ANTIBODY (ROUTINE TESTING W REFLEX): HIV Screen 4th Generation wRfx: NONREACTIVE

## 2023-07-26 MED ORDER — HYDRALAZINE HCL 20 MG/ML IJ SOLN: 10.0000 mg | INTRAMUSCULAR | Status: DC | PRN

## 2023-07-26 MED ORDER — LACTATED RINGERS IV SOLN: INTRAVENOUS | Status: AC

## 2023-07-26 MED ORDER — HYDROXYZINE HCL 25 MG PO TABS
25.0000 mg | ORAL_TABLET | Freq: Two times a day (BID) | ORAL | Status: DC
  Administered 2023-07-26 – 2023-07-27 (×2): 25 mg via ORAL
  Filled 2023-07-26 (×2): qty 1

## 2023-07-26 MED ORDER — INSULIN ASPART 100 UNIT/ML IJ SOLN
0.0000 [IU] | Freq: Three times a day (TID) | INTRAMUSCULAR | Status: DC
  Administered 2023-07-26: 1 [IU] via SUBCUTANEOUS

## 2023-07-26 MED ORDER — ACETAMINOPHEN 325 MG PO TABS
650.0000 mg | ORAL_TABLET | Freq: Four times a day (QID) | ORAL | Status: DC | PRN
Start: 2023-07-26 — End: 2023-07-27

## 2023-07-26 MED ORDER — PIPERACILLIN-TAZOBACTAM 3.375 G IVPB
3.3750 g | Freq: Three times a day (TID) | INTRAVENOUS | Status: DC
  Administered 2023-07-26 – 2023-07-27 (×4): 3.375 g via INTRAVENOUS
  Filled 2023-07-26 (×4): qty 50

## 2023-07-26 MED ORDER — ONDANSETRON 4 MG PO TBDP: 4.0000 mg | ORAL_TABLET | Freq: Three times a day (TID) | ORAL | Status: DC | PRN

## 2023-07-26 MED ORDER — INSULIN ASPART 100 UNIT/ML IJ SOLN: 0.0000 [IU] | INTRAMUSCULAR | Status: DC

## 2023-07-26 MED ORDER — HYDROMORPHONE HCL 1 MG/ML IJ SOLN
1.0000 mg | INTRAMUSCULAR | Status: DC | PRN
  Administered 2023-07-26 (×2): 1 mg via INTRAVENOUS
  Filled 2023-07-26 (×2): qty 1

## 2023-07-26 MED ORDER — SENNOSIDES 8.8 MG/5ML PO SYRP
10.0000 mL | ORAL_SOLUTION | Freq: Two times a day (BID) | ORAL | Status: DC
  Filled 2023-07-26 (×3): qty 10

## 2023-07-26 NOTE — Plan of Care (Signed)
  Problem: Health Behavior/Discharge Planning: Goal: Ability to manage health-related needs will improve Outcome: Progressing   Problem: Pain Managment: Goal: General experience of comfort will improve and/or be controlled Outcome: Progressing   Problem: Metabolic: Goal: Ability to maintain appropriate glucose levels will improve Outcome: Progressing

## 2023-07-26 NOTE — Progress Notes (Signed)
   07/26/23 1532  TOC Brief Assessment  Insurance and Status Reviewed  Patient has primary care physician Yes (Paseda, Folashade R, FNP)  Home environment has been reviewed yes from home  Prior level of function: Independent  Social Drivers of Health Review SDOH reviewed no interventions necessary  Readmission risk has been reviewed Yes  Transition of care needs no transition of care needs at this time

## 2023-07-26 NOTE — H&P (Signed)
 History and Physical    [name]:[id number] DOB: Jul 12, 1955 DOA: 07/25/2023  Patient coming from: Home.  Chief Complaint: Abdominal pain.  HPI: [name] is a 68 y.o. male with history of CAD status post stenting, hypertension, hyperlipidemia, CML and prostate cancer presents to the ER with complaints of severe abdominal pain since yesterday afternoon.  Pain is mostly in the left lower quadrant denies any associated nausea vomiting or diarrhea.  ED Course: In the ER CT abdomen pelvis shows features concerning for diverticulitis involving the distal descending colon and proximal sigmoid colon with possible microperforation.  ER physician has discussed with Dr. Elvan Hamel on-call general surgeon requested hospitalist admission.  Patient was started on empiric antibiotics.  Labs show creatinine 1.5 WBC 11.6.  Review of Systems: As per HPI, rest all negative.   Past Medical History:  Diagnosis Date   Arthritis    Bladder disorder    Cancer (HCC)    hx prostate cancer and CML   Chronic myelocytic leukemia (HCC)    Coronary artery disease    with prior MI 2002([facility]) and Cfx(2006) and RCA (2004) stents . EF 45-50%   Coronary atherosclerosis of native coronary artery    stable and suspect a component of CAS   Diverticulosis    seen on colonoscopy in 2008   Goals of care, counseling/discussion 09/13/2016   H/O colonoscopy 2014   Hyperlipidemia    Hypertension    Myocardial infarction (HCC) 02,6/12   Prostate cancer (HCC)     Past Surgical History:  Procedure Laterality Date   CARDIAC CATHETERIZATION     normal EF, patent stents without obstructive disease (on Plavix  X 1 month only)   CAROTID STENT     CORONARY BALLOON ANGIOPLASTY N/A 05/23/2022   Procedure: CORONARY BALLOON ANGIOPLASTY;  Surgeon: Lucendia Rusk, MD;  Location: [facility] INVASIVE CV LAB;  Service: Cardiovascular;  Laterality: N/A;   CORONARY PRESSURE/FFR STUDY N/A 05/23/2022   Procedure: INTRAVASCULAR  PRESSURE WIRE/FFR STUDY;  Surgeon: Lucendia Rusk, MD;  Location: [facility] INVASIVE CV LAB;  Service: Cardiovascular;  Laterality: N/A;   FRACTURE SURGERY     rt wrist   LEFT HEART CATH AND CORONARY ANGIOGRAPHY N/A 05/23/2022   Procedure: LEFT HEART CATH AND CORONARY ANGIOGRAPHY;  Surgeon: Lucendia Rusk, MD;  Location: [facility] INVASIVE CV LAB;  Service: Cardiovascular;  Laterality: N/A;   LEFT HEART CATHETERIZATION WITH CORONARY ANGIOGRAM N/A 02/04/2012   Procedure: LEFT HEART CATHETERIZATION WITH CORONARY ANGIOGRAM;  Surgeon: Mickiel Albany, MD;  Location: [facility] CATH LAB;  Service: Cardiovascular;  Laterality: N/A;   ORIF ANKLE FRACTURE  07/04/2011   Procedure: OPEN REDUCTION INTERNAL FIXATION (ORIF) ANKLE FRACTURE;  Surgeon: Boston Byers, MD;  Location: [facility];  Service: Orthopedics;  Laterality: Right;   TOE SURGERY     rt great toe   TONSILLECTOMY     WRIST SURGERY     rt and lt cysts removed     reports that he quit smoking about 23 years ago. His smoking use included cigarettes. He has never used smokeless tobacco. He reports that he does not drink alcohol and does not use drugs.  Allergies  Allergen Reactions   Contrast Media [Iodinated Contrast Media] Hives    "a few hives; not a lot"    Family History  Problem Relation Age of Onset   Cancer Mother        Liver    Prior to Admission medications   Medication Sig  Start Date End Date Taking? Authorizing Provider  acetaminophen  (TYLENOL ) 325 MG tablet Take 2 tablets (650 mg total) by mouth every 4 (four) hours as needed for headache or mild pain. 05/24/22   Aura Leeds Latif, DO  amoxicillin  (AMOXIL ) 500 MG capsule Take 2 capsules (1,000 mg total) by mouth 2 (two) times daily. 08/13/22   Albertus Hughs, DO  Ascorbic Acid (VITAMIN C) 1000 MG tablet Take 1,000 mg by mouth daily.    [provider]  aspirin  81 MG chewable tablet Chew 1 tablet (81 mg total) by mouth daily. 05/25/22   Sheikh, Omair Latif, DO   cholecalciferol (VITAMIN D) 1000 UNITS tablet Take 5,000 Units by mouth daily.     [provider]  Coenzyme Q10 (COQ10) 100 MG CAPS Take 100 mg by mouth daily. Per patient taking 200 mg    [provider]  cyclobenzaprine  (FLEXERIL ) 10 MG tablet Take 10 mg by mouth at bedtime as needed.    [provider]  darifenacin  (ENABLEX ) 15 MG 24 hr tablet Take 15 mg by mouth daily.     [provider]  DEXILANT 60 MG capsule TAKE 1 CAPSULE BY MOUTH ONCE DAILY 01/03/18   Cleave Curling, MD  ezetimibe  (ZETIA ) 10 MG tablet Take 10 mg by mouth daily. 01/16/22   [provider]  famotidine  (PEPCID ) 20 MG tablet Take 20 mg by mouth 2 (two) times daily. Patient not taking: Reported on 09/03/2022 05/22/22 05/22/23  [provider]  hydrOXYzine  (ATARAX /VISTARIL ) 25 MG tablet Take 1 tablet (25 mg total) by mouth 2 (two) times daily. 12/05/17   Cleave Curling, MD  isosorbide  mononitrate (IMDUR ) 60 MG 24 hr tablet TAKE 1 TABLET BY MOUTH ONCE DAILY 05/27/17   Arty Binning, MD  meloxicam (MOBIC) 15 MG tablet Take 1 tablet by mouth as needed. 03/14/22   [provider]  metoprolol  succinate (TOPROL -XL) 50 MG 24 hr tablet Take 1.5 tablets (75 mg total) by mouth daily. Take with or immediately following a meal. 06/07/22   Von Grumbling, PA-C  MYRBETRIQ  50 MG TB24 tablet Take 50 mg by mouth daily. 12/25/21   [provider]  nitroGLYCERIN  (NITROSTAT ) 0.4 MG SL tablet Place 1 tablet (0.4 mg total) under the tongue every 5 (five) minutes as needed for chest pain. 05/24/22   Aura Leeds Latif, DO  olmesartan  (BENICAR ) 20 MG tablet Take 2 tablets (40 mg total) by mouth daily. 06/07/22   Von Grumbling, PA-C  Omega-3 Fatty Acids (FISH OIL PO) Take by mouth.    [provider]  OZEMPIC, 0.25 OR 0.5 MG/DOSE, 2 MG/3ML SOPN Inject 0.5 mg into the skin every 14 (fourteen) days. Pt stated PCP changed ozempic to every 2 weeks. 12/13/21   [provider]   rosuvastatin  (CRESTOR ) 40 MG tablet Take 1 tablet (40 mg total) by mouth daily. 06/21/22   Lucendia Rusk, MD  ticagrelor  (BRILINTA ) 90 MG TABS tablet Take 1 tablet (90 mg total) by mouth 2 (two) times daily. Patient taking differently: Take 90 mg by mouth 2 (two) times daily. Per patient taking once daily. Will start taking twice a day 05/24/22   Sheikh, Omair Latif, DO  VITAMIN E PO Take 1 tablet by mouth daily.    [provider]    Physical Exam: Constitutional: Moderately built and nourished. Vitals:   07/25/23 1605 07/25/23 1810 07/25/23 2246 07/26/23 0236  BP:  (!) 140/96 (!) 158/101 (!) 153/97  Pulse:  81 69 72  Resp:  18 14 14   Temp:   98.4 F (36.9 C) 97.6 F (36.4 C)  TempSrc:   Oral Oral  SpO2:  97% 98% 100%  Weight: 77.9 kg  80.2 kg   Height: 5\' 8"  (1.727 m)      Eyes: Anicteric no pallor. ENMT: No discharge from the ears/nose or mouth. Neck: No mass felt.  No neck rigidity. Respiratory: No rhonchi or crepitations. Cardiovascular: S1-S2 heard. Abdomen: Mild tenderness in the left lower quadrant. Musculoskeletal: No edema. Skin: No rash. Neurologic: Alert awake oriented time place and person.  Moves all extremities. Psychiatric: Appears normal.  Normal affect.   Labs on Admission: I have personally reviewed following labs and imaging studies  CBC: Recent Labs  Lab 07/25/23 1834  WBC 11.6*  HGB 14.6  HCT 42.1  MCV 91.9  PLT 258   Basic Metabolic Panel: Recent Labs  Lab 07/25/23 1834  NA 141  K 4.7  CL 107  CO2 24  GLUCOSE 120*  BUN 17  CREATININE 1.52*  CALCIUM  9.3   GFR: Estimated Creatinine Clearance: 46.3 mL/min (A) (by C-G formula based on SCr of 1.52 mg/dL (H)). Liver Function Tests: No results for input(s): "AST", "ALT", "ALKPHOS", "BILITOT", "PROT", "ALBUMIN" in the last 168 hours. No results for input(s): "LIPASE", "AMYLASE" in the last 168 hours. No results for input(s): "AMMONIA" in the last 168 hours. Coagulation  Profile: No results for input(s): "INR", "PROTIME" in the last 168 hours. Cardiac Enzymes: No results for input(s): "CKTOTAL", "CKMB", "CKMBINDEX", "TROPONINI" in the last 168 hours. BNP (last 3 results) No results for input(s): "PROBNP" in the last 8760 hours. HbA1C: No results for input(s): "HGBA1C" in the last 72 hours. CBG: Recent Labs  Lab 07/26/23 0238  GLUCAP 103*   Lipid Profile: No results for input(s): "CHOL", "HDL", "LDLCALC", "TRIG", "CHOLHDL", "LDLDIRECT" in the last 72 hours. Thyroid Function Tests: No results for input(s): "TSH", "T4TOTAL", "FREET4", "T3FREE", "THYROIDAB" in the last 72 hours. Anemia Panel: No results for input(s): "VITAMINB12", "FOLATE", "FERRITIN", "TIBC", "IRON", "RETICCTPCT" in the last 72 hours. Urine analysis:    Component Value Date/Time   COLORURINE YELLOW 07/25/2023 1609   APPEARANCEUR CLEAR 07/25/2023 1609   LABSPEC 1.029 07/25/2023 1609   PHURINE 5.5 07/25/2023 1609   GLUCOSEU NEGATIVE 07/25/2023 1609   HGBUR NEGATIVE 07/25/2023 1609   BILIRUBINUR NEGATIVE 07/25/2023 1609   KETONESUR TRACE (A) 07/25/2023 1609   PROTEINUR TRACE (A) 07/25/2023 1609   UROBILINOGEN 0.2 08/09/2010 2005   NITRITE NEGATIVE 07/25/2023 1609   LEUKOCYTESUR NEGATIVE 07/25/2023 1609   Sepsis Labs: @LABRCNTIP (procalcitonin:4,lacticidven:4) )No results found for this or any previous visit (from the past 240 hours).   Radiological Exams on Admission: CT Renal Stone Study Addendum Date: 07/25/2023 ADDENDUM REPORT: 07/25/2023 19:46 ADDENDUM: Critical Value/emergent results were called by telephone at the time of interpretation on 07/25/2023 at 7:45 pm to provider [facility] , who verbally acknowledged these results. Electronically Signed   By: Wyvonnia Heimlich M.D.   On: 07/25/2023 19:46   Result Date: 07/25/2023 CLINICAL DATA:  Left abdominal/flank pain, stone suspected. EXAM: CT ABDOMEN AND PELVIS WITHOUT CONTRAST TECHNIQUE: Multidetector CT imaging of the  abdomen and pelvis was performed following the standard protocol without IV contrast. RADIATION DOSE REDUCTION: This exam was performed according to the departmental dose-optimization program which includes automated exposure control, adjustment of the mA and/or kV according to patient size and/or use of iterative reconstruction technique. COMPARISON:  01/27/2020. FINDINGS: Lower chest: Coronary artery calcifications are present. The  lung bases are clear. Hepatobiliary: No focal liver abnormality is seen. No gallstones, gallbladder wall thickening, or biliary dilatation. Pancreas: Unremarkable. No pancreatic ductal dilatation or surrounding inflammatory changes. Spleen: Normal in size without focal abnormality. Adrenals/Urinary Tract: Adrenal glands are within normal limits. Hyperdense regions are present at the medullary pyramids. No obstructive uropathy is seen bilaterally. The bladder is unremarkable. Stomach/Bowel: Stomach is within normal limits. Appendix appears normal. Scattered diverticula are present along the colon. There is pericolonic fat stranding involving the distal descending colon and proximal sigmoid colon, compatible with diverticulitis. There are a few questionable foci of free air adjacent to the sigmoid colon in the left lower quadrant, possible micro perforation. No abscess is seen. Vascular/Lymphatic: Aortic atherosclerosis. No enlarged abdominal or pelvic lymph nodes. Reproductive: Prostate gland is not visualized. Other: No abdominopelvic ascites. Fat containing umbilical hernia is present. Musculoskeletal: Degenerative changes are noted in the thoracolumbar spine. No acute osseous abnormality. IMPRESSION: 1. Findings compatible with diverticulitis involving the distal descending colon and proximal sigmoid colon. There are questionable foci of free air adjacent to the colon in the left lower quadrant, possible microperforations. No abscess is seen. 2. Hyperdense material the renal pyramids  bilaterally, possible medullary calcinosis. No obstructive uropathy bilaterally. 3. Aortic atherosclerosis and coronary artery calcification. Electronically Signed: By: Wyvonnia Heimlich M.D. On: 07/25/2023 19:40      Assessment/Plan Principal Problem:   Acute diverticulitis Active Problems:   Type 2 diabetes mellitus with complication, without long-term current use of insulin (HCC)   Essential hypertension   Hyperlipidemia with target low density lipoprotein (LDL) cholesterol less than 70 mg/dL   Chronic myeloid leukemia (HCC)   History of prostate cancer   CAD S/P percutaneous coronary angioplasty    Acute diverticulitis of the descending colon and proximal sigmoid colon with possible microperforation for which patient is on empiric antibiotics.  General surgery was consulted.  On IV fluids.  Pain medications.  Patient has had a colonoscopy in June 2021 at [facility]. CAD status post stenting last one in 2024 March.  Takes aspirin  Repatha statins beta-blockers.  Presently NPO. Hypertension will keep patient on as needed IV hydralazine .  Takes metoprolol  and Benicar  at home. Acute renal failure could be from dehydration.  Gently hydrate.  Follow metabolic panel. History of CML follows with oncologist at [facility].  On Scemblix 40 mg twice daily. Type 2 diabetes mentioned in the chart.  Will check hemoglobin A1c.  Patient is not on any medications.  Patient has acute diverticulitis will need close monitoring further workup and more than 2 midnight stay.   DVT prophylaxis: SCDs. Code Status: Full code. Family Communication: Discussed with patient. Disposition Plan: Medical floor. Consults called: General Surgery. Admission status: Inpatient.

## 2023-07-26 NOTE — Consult Note (Signed)
 Consult Note  [name] [birth year]  [id number].    Requesting MD: Amber Bail, MD Chief Complaint/Reason for Consult: Diverticulitis with microperforation  HPI:  Patient is a 68 year old male who presented to the ED with abdominal pain that started yesterday. Pain is along left flank/back. Denies associated nausea, vomiting, diarrhea, fever, chills, or urinary symptoms. He is not passing flatus this AM and has not had a BM but did have a BM yesterday. Denies blood or mucus in stool. Generally has a BM daily. He has a known history of diverticulosis on prior colonoscopy but this is his first episode of diverticulitis. PMH otherwise significant for CAD s/p stent most recently in march of last year, CML now in remission, prostate cancer s/p prostatectomy, HTN, and HLD. He was taken off Brilinta  by cardiology last month. He is very active and goes to the gym 4-5x per week. Allergy to contrast dye. He owns his own business.   ROS: Negative other than HPI  Family History  Problem Relation Age of Onset   Cancer Mother        Liver    Past Medical History:  Diagnosis Date   Arthritis    Bladder disorder    Cancer (HCC)    hx prostate cancer and CML   Chronic myelocytic leukemia (HCC)    Coronary artery disease    with prior MI 2002([facility]) and Cfx(2006) and RCA (2004) stents . EF 45-50%   Coronary atherosclerosis of native coronary artery    stable and suspect a component of CAS   Diverticulosis    seen on colonoscopy in 2008   Goals of care, counseling/discussion 09/13/2016   H/O colonoscopy 2014   Hyperlipidemia    Hypertension    Myocardial infarction (HCC) 02,6/12   Prostate cancer (HCC)     Past Surgical History:  Procedure Laterality Date   CARDIAC CATHETERIZATION     normal EF, patent stents without obstructive disease (on Plavix  X 1 month only)   CAROTID STENT     CORONARY BALLOON ANGIOPLASTY N/A 05/23/2022   Procedure: CORONARY BALLOON ANGIOPLASTY;   Surgeon: Lucendia Rusk, MD;  Location: [facility] INVASIVE CV LAB;  Service: Cardiovascular;  Laterality: N/A;   CORONARY PRESSURE/FFR STUDY N/A 05/23/2022   Procedure: INTRAVASCULAR PRESSURE WIRE/FFR STUDY;  Surgeon: Lucendia Rusk, MD;  Location: [facility] INVASIVE CV LAB;  Service: Cardiovascular;  Laterality: N/A;   FRACTURE SURGERY     rt wrist   LEFT HEART CATH AND CORONARY ANGIOGRAPHY N/A 05/23/2022   Procedure: LEFT HEART CATH AND CORONARY ANGIOGRAPHY;  Surgeon: Lucendia Rusk, MD;  Location: [facility] INVASIVE CV LAB;  Service: Cardiovascular;  Laterality: N/A;   LEFT HEART CATHETERIZATION WITH CORONARY ANGIOGRAM N/A 02/04/2012   Procedure: LEFT HEART CATHETERIZATION WITH CORONARY ANGIOGRAM;  Surgeon: Mickiel Albany, MD;  Location: [facility] CATH LAB;  Service: Cardiovascular;  Laterality: N/A;   ORIF ANKLE FRACTURE  07/04/2011   Procedure: OPEN REDUCTION INTERNAL FIXATION (ORIF) ANKLE FRACTURE;  Surgeon: Boston Byers, MD;  Location: [facility];  Service: Orthopedics;  Laterality: Right;   TOE SURGERY     rt great toe   TONSILLECTOMY     WRIST SURGERY     rt and lt cysts removed    Social History:  reports that he quit smoking about 23 years ago. His smoking use included cigarettes. He has never used smokeless tobacco. He reports that he does not drink alcohol and does not  use drugs.  Allergies:  Allergies  Allergen Reactions   Contrast Media [Iodinated Contrast Media] Hives    "a few hives; not a lot"    Medications Prior to Admission  Medication Sig Dispense Refill   acetaminophen  (TYLENOL ) 650 MG CR tablet Take 1,300 mg by mouth daily as needed for pain.     ascorbic acid (VITAMIN C) 500 MG tablet Take 500 mg by mouth daily.     aspirin  81 MG chewable tablet Chew 1 tablet (81 mg total) by mouth daily. 30 tablet 0   azithromycin (ZITHROMAX) 250 MG tablet Take 250 mg by mouth 3 (three) times a week. Monday, Tuesday, wednesday     cholecalciferol (VITAMIN D) 1000 UNITS  tablet Take 3,000 Units by mouth daily.     Coenzyme Q10 (COQ-10) 200 MG CAPS Take 200 mg by mouth daily.     darifenacin  (ENABLEX ) 15 MG 24 hr tablet Take 15 mg by mouth daily.      DEXILANT 60 MG capsule TAKE 1 CAPSULE BY MOUTH ONCE DAILY 90 capsule 1   ezetimibe  (ZETIA ) 10 MG tablet Take 10 mg by mouth daily.     famotidine  (PEPCID ) 20 MG tablet Take 20 mg by mouth daily.     hydrOXYzine  (ATARAX /VISTARIL ) 25 MG tablet Take 1 tablet (25 mg total) by mouth 2 (two) times daily. (Patient taking differently: Take 50 mg by mouth daily.) 90 tablet 0   isosorbide  mononitrate (IMDUR ) 60 MG 24 hr tablet TAKE 1 TABLET BY MOUTH ONCE DAILY 90 tablet 3   metoprolol  succinate (TOPROL -XL) 50 MG 24 hr tablet Take 1.5 tablets (75 mg total) by mouth daily. Take with or immediately following a meal. (Patient taking differently: Take 50 mg by mouth daily. Take with or immediately following a meal.) 135 tablet 2   MYRBETRIQ  50 MG TB24 tablet Take 50 mg by mouth daily.     nitroGLYCERIN  (NITROSTAT ) 0.4 MG SL tablet Place 1 tablet (0.4 mg total) under the tongue every 5 (five) minutes as needed for chest pain. 25 tablet 12   olmesartan  (BENICAR ) 20 MG tablet Take 2 tablets (40 mg total) by mouth daily. (Patient taking differently: Take 20 mg by mouth daily.) 90 tablet 3   Omega-3 Fatty Acids (FISH OIL PO) Take 1 capsule by mouth daily.     oxybutynin (DITROPAN-XL) 5 MG 24 hr tablet Take 1 tablet by mouth daily.     OZEMPIC, 0.25 OR 0.5 MG/DOSE, 2 MG/3ML SOPN Inject 0.25 mg into the skin every 14 (fourteen) days.     REPATHA SURECLICK 140 MG/ML SOAJ Inject 1 mL into the skin every 14 (fourteen) days.     rosuvastatin  (CRESTOR ) 40 MG tablet Take 1 tablet (40 mg total) by mouth daily. 90 tablet 3   VITAMIN E PO Take 1 tablet by mouth daily.     mupirocin ointment (BACTROBAN) 2 % Apply topically 2 (two) times daily. (Patient not taking: Reported on 07/26/2023)     ticagrelor  (BRILINTA ) 90 MG TABS tablet Take 1 tablet (90  mg total) by mouth 2 (two) times daily. (Patient not taking: Reported on 07/26/2023) 60 tablet 0    Blood pressure (!) 148/92, pulse 69, temperature 98.1 F (36.7 C), temperature source Oral, resp. rate 17, height 5\' 8"  (1.727 m), weight 80.2 kg, SpO2 98%. Physical Exam:  General: pleasant, WD, WN male who is laying in bed in NAD HEENT: head is normocephalic, atraumatic.  Sclera are noninjected.  Pupils equal and round.  Ears and  nose without any masses or lesions.  Mouth is pink and moist Heart: regular, rate, and rhythm.  Palpable radial and pedal pulses bilaterally Lungs: No wheezes, rhonchi, or rales noted.  Respiratory effort nonlabored Abd: soft, mild ttp of left flank without peritonitis, ND, no masses, hernias, or organomegaly. Prior surgical scars from prostatectomy  MS: all 4 extremities are symmetrical with no cyanosis, clubbing, or edema. Skin: warm and dry with no masses, lesions, or rashes Neuro: Cranial nerves 2-12 grossly intact, sensation is normal throughout Psych: A&Ox3 with an appropriate affect.   Results for orders placed or performed during the hospital encounter of 07/25/23 (from the past 48 hours)  Urinalysis, Routine w reflex microscopic -Urine, Clean Catch     Status: Abnormal   Collection Time: 07/25/23  4:09 PM  Result Value Ref Range   Color, Urine YELLOW YELLOW   APPearance CLEAR CLEAR   Specific Gravity, Urine 1.029 1.005 - 1.030   pH 5.5 5.0 - 8.0   Glucose, UA NEGATIVE NEGATIVE mg/dL   Hgb urine dipstick NEGATIVE NEGATIVE   Bilirubin Urine NEGATIVE NEGATIVE   Ketones, ur TRACE (A) NEGATIVE mg/dL   Protein, ur TRACE (A) NEGATIVE mg/dL   Nitrite NEGATIVE NEGATIVE   Leukocytes,Ua NEGATIVE NEGATIVE    Comment: Performed at [facility], [address] [place], [place] [address]  CBC     Status: Abnormal   Collection Time: 07/25/23  6:34 PM  Result Value Ref Range   WBC 11.6 (H) 4.0 - 10.5 K/uL   RBC 4.58 4.22 - 5.81 MIL/uL    Hemoglobin 14.6 13.0 - 17.0 g/dL   HCT 60.4 54.0 - 98.1 %   MCV 91.9 80.0 - 100.0 fL   MCH 31.9 26.0 - 34.0 pg   MCHC 34.7 30.0 - 36.0 g/dL   RDW 19.1 47.8 - 29.5 %   Platelets 258 150 - 400 K/uL   nRBC 0.0 0.0 - 0.2 %    Comment: Performed at [facility], [address], [place], [place] [address]  Basic metabolic panel     Status: Abnormal   Collection Time: 07/25/23  6:34 PM  Result Value Ref Range   Sodium 141 135 - 145 mmol/L   Potassium 4.7 3.5 - 5.1 mmol/L    Comment: HEMOLYSIS AT THIS LEVEL MAY AFFECT RESULT   Chloride 107 98 - 111 mmol/L   CO2 24 22 - 32 mmol/L   Glucose, Bld 120 (H) 70 - 99 mg/dL    Comment: Glucose reference range applies only to samples taken after fasting for at least 8 hours.   BUN 17 8 - 23 mg/dL   Creatinine, Ser 8.65 (H) 0.61 - 1.24 mg/dL   Calcium  9.3 8.9 - 10.3 mg/dL   GFR, Estimated 50 (L) >60 mL/min    Comment: (NOTE) Calculated using the CKD-EPI Creatinine Equation (2021)    Anion gap 11 5 - 15    Comment: Performed at [facility], [address], [place], [place] [address]  Glucose, capillary     Status: Abnormal   Collection Time: 07/26/23  2:38 AM  Result Value Ref Range   Glucose-Capillary 103 (H) 70 - 99 mg/dL    Comment: Glucose reference range applies only to samples taken after fasting for at least 8 hours.  Culture, blood (Routine X 2) w Reflex to ID Panel     Status: None (Preliminary result)   Collection Time: 07/26/23  5:58 AM   Specimen: BLOOD RIGHT ARM  Result  Value Ref Range   Specimen Description      BLOOD RIGHT ARM Performed at [facility], [address] [place], [place] [address]    Special Requests      BOTTLES DRAWN AEROBIC AND ANAEROBIC Blood Culture results may not be optimal due to an inadequate volume of blood received in culture bottles Performed at [facility], [address] [place], [place] [address]    Culture PENDING    Report  Status PENDING   Culture, blood (Routine X 2) w Reflex to ID Panel     Status: None (Preliminary result)   Collection Time: 07/26/23  5:58 AM   Specimen: BLOOD RIGHT HAND  Result Value Ref Range   Specimen Description      BLOOD RIGHT HAND Performed at [facility], [address], [place], [place] [address]    Special Requests      BOTTLES DRAWN AEROBIC ONLY Blood Culture results may not be optimal due to an inadequate volume of blood received in culture bottles Performed at [facility], [address] [place], [place] [address]    Culture PENDING    Report Status PENDING   Hemoglobin A1c     Status: None   Collection Time: 07/26/23  5:58 AM  Result Value Ref Range   Hgb A1c MFr Bld 5.6 4.8 - 5.6 %    Comment: (NOTE) Pre diabetes:          5.7%-6.4%  Diabetes:              >6.4%  Glycemic control for   <7.0% adults with diabetes    Mean Plasma Glucose 114.02 mg/dL    Comment: Performed at [facility], [address], [place], [place] [address]  Basic metabolic panel     Status: Abnormal   Collection Time: 07/26/23  5:58 AM  Result Value Ref Range   Sodium 139 135 - 145 mmol/L   Potassium 4.3 3.5 - 5.1 mmol/L   Chloride 109 98 - 111 mmol/L   CO2 25 22 - 32 mmol/L   Glucose, Bld 93 70 - 99 mg/dL    Comment: Glucose reference range applies only to samples taken after fasting for at least 8 hours.   BUN 20 8 - 23 mg/dL   Creatinine, Ser 1.30 0.61 - 1.24 mg/dL   Calcium  8.4 (L) 8.9 - 10.3 mg/dL   GFR, Estimated >86 >57 mL/min    Comment: (NOTE) Calculated using the CKD-EPI Creatinine Equation (2021)    Anion gap 5 5 - 15    Comment: Performed at [facility] [address], [place], [place] [address]  Hepatic function panel     Status: Abnormal   Collection Time: 07/26/23  5:58 AM  Result Value Ref Range   Total Protein 6.0 (L) 6.5 - 8.1 g/dL   Albumin 3.3 (L) 3.5 - 5.0 g/dL   AST 14 (L) 15 - 41 U/L   ALT 16 0 - 44  U/L   Alkaline Phosphatase 51 38 - 126 U/L   Total Bilirubin 0.3 0.0 - 1.2 mg/dL   Bilirubin, Direct 0.1 0.0 - 0.2 mg/dL   Indirect Bilirubin 0.2 (L) 0.3 - 0.9 mg/dL    Comment: Performed at [facility], [address] [place], [place] [address]  CBC with Differential/Platelet     Status: Abnormal   Collection Time: 07/26/23  5:58 AM  Result Value Ref Range  WBC 10.6 (H) 4.0 - 10.5 K/uL   RBC 4.32 4.22 - 5.81 MIL/uL   Hemoglobin 13.9 13.0 - 17.0 g/dL   HCT 27.2 53.6 - 64.4 %   MCV 95.4 80.0 - 100.0 fL   MCH 32.2 26.0 - 34.0 pg   MCHC 33.7 30.0 - 36.0 g/dL   RDW 03.4 74.2 - 59.5 %   Platelets 232 150 - 400 K/uL   nRBC 0.0 0.0 - 0.2 %   Neutrophils Relative % 62 %   Neutro Abs 6.7 1.7 - 7.7 K/uL   Lymphocytes Relative 27 %   Lymphs Abs 2.9 0.7 - 4.0 K/uL   Monocytes Relative 8 %   Monocytes Absolute 0.9 0.1 - 1.0 K/uL   Eosinophils Relative 1 %   Eosinophils Absolute 0.1 0.0 - 0.5 K/uL   Basophils Relative 1 %   Basophils Absolute 0.1 0.0 - 0.1 K/uL   Immature Granulocytes 1 %   Abs Immature Granulocytes 0.07 0.00 - 0.07 K/uL    Comment: Performed at [facility], [address] [place], [place] [address]  Glucose, capillary     Status: None   Collection Time: 07/26/23  7:47 AM  Result Value Ref Range   Glucose-Capillary 99 70 - 99 mg/dL    Comment: Glucose reference range applies only to samples taken after fasting for at least 8 hours.   DG Abd 1 View Result Date: 07/26/2023 CLINICAL DATA:  Abdominal pain EXAM: ABDOMEN - 1 VIEW COMPARISON:  Abdominal CT from yesterday FINDINGS: The bowel gas pattern is normal. No radio-opaque calculi or other significant radiographic abnormality are seen. No abnormal gas collection by supine technique. Stable lumbar dextrocurvature. Mild atheromatous calcification. IMPRESSION: Normal bowel gas pattern. Electronically Signed   By: Ronnette Coke M.D.   On: 07/26/2023 07:22   CT Renal Stone  Study Addendum Date: 07/25/2023 ADDENDUM REPORT: 07/25/2023 19:46 ADDENDUM: Critical Value/emergent results were called by telephone at the time of interpretation on 07/25/2023 at 7:45 pm to provider [facility] , who verbally acknowledged these results. Electronically Signed   By: Wyvonnia Heimlich M.D.   On: 07/25/2023 19:46   Result Date: 07/25/2023 CLINICAL DATA:  Left abdominal/flank pain, stone suspected. EXAM: CT ABDOMEN AND PELVIS WITHOUT CONTRAST TECHNIQUE: Multidetector CT imaging of the abdomen and pelvis was performed following the standard protocol without IV contrast. RADIATION DOSE REDUCTION: This exam was performed according to the departmental dose-optimization program which includes automated exposure control, adjustment of the mA and/or kV according to patient size and/or use of iterative reconstruction technique. COMPARISON:  01/27/2020. FINDINGS: Lower chest: Coronary artery calcifications are present. The lung bases are clear. Hepatobiliary: No focal liver abnormality is seen. No gallstones, gallbladder wall thickening, or biliary dilatation. Pancreas: Unremarkable. No pancreatic ductal dilatation or surrounding inflammatory changes. Spleen: Normal in size without focal abnormality. Adrenals/Urinary Tract: Adrenal glands are within normal limits. Hyperdense regions are present at the medullary pyramids. No obstructive uropathy is seen bilaterally. The bladder is unremarkable. Stomach/Bowel: Stomach is within normal limits. Appendix appears normal. Scattered diverticula are present along the colon. There is pericolonic fat stranding involving the distal descending colon and proximal sigmoid colon, compatible with diverticulitis. There are a few questionable foci of free air adjacent to the sigmoid colon in the left lower quadrant, possible micro perforation. No abscess is seen. Vascular/Lymphatic: Aortic atherosclerosis. No enlarged abdominal or pelvic lymph nodes. Reproductive: Prostate  gland is not visualized. Other: No abdominopelvic ascites. Fat containing umbilical hernia is present. Musculoskeletal:  Degenerative changes are noted in the thoracolumbar spine. No acute osseous abnormality. IMPRESSION: 1. Findings compatible with diverticulitis involving the distal descending colon and proximal sigmoid colon. There are questionable foci of free air adjacent to the colon in the left lower quadrant, possible microperforations. No abscess is seen. 2. Hyperdense material the renal pyramids bilaterally, possible medullary calcinosis. No obstructive uropathy bilaterally. 3. Aortic atherosclerosis and coronary artery calcification. Electronically Signed: By: Wyvonnia Heimlich M.D. On: 07/25/2023 19:40      Assessment/Plan Acute descending diverticulitis with questionable microperforation - CT with above, no abscess noted - WBC 10.6 from 11.6, afebrile and HD stable  - some ttp on exam but no peritonitis, no indication for acute surgical intervention  - ok to have CLD from surgery standpoint  - agree with IV abx, can DC on PO augmentin  to complete 10 days total course - would recommend follow up with GI outpatient and discussion of repeat colonoscopy in a few weeks - if patient should worsen acutely would recommend repeat CTAP 5 days from last scan  - would not necessarily recommend consideration of elective colectomy with this being first episode of diverticulitis but if patient would like to discuss further after GI follow up [facility] would be happy to see him back in the office   FEN: CLD, IVF per [facility] VTE: ok to have SQH or LMWH from surgical standpoint ID: Zosyn 5/22>>  - per [facility] -  Hx of CAD s/p stents in 05/2022, off Brilinta  as of last month Hx of CML now in remission Hx of prostate cancer s/p resection  HTN HLD  I reviewed ED provider notes, hospitalist notes, last 24 h vitals and pain scores, last 48 h intake and output, last 24 h labs and trends, and last 24 h imaging  results.  This care required high  level of medical decision making.   Annetta Killian, [facility] Surgery 07/26/2023, 11:38 AM Please see Amion for pager number during day hours 7:00am-4:30pm

## 2023-07-26 NOTE — Progress Notes (Signed)
 Nutrition Note  RD consulted for nutrition education regarding Nutrition Management for Diverticulosis/Diverticulitis.  RD provided both "Low Fiber Nutrition Therapy" and "High Fiber Nutrition Therapy" handout from the Academy of [employer] and Dietetics. Reviewed home diet with pt and suggested ways to meet nutrition goals over the next several weeks. Explained reasons to follow Low Fiber diet for the next 6-8 weeks and discussed ways to achieve.  Discussed best practice for long-term management of diverticulosis is a High Fiber diet and discussed ways to increase fiber content in an appropriate time frame.  Discussed symptom management should abdominal pain occur while on high fiber diet.  Pt verbalizes understanding of information provided. Expect good compliance.   Body mass index is 26.89 kg/m^2. Pt meets criteria for normal based on current BMI.  Current diet order is clear liquids. Labs and medications reviewed. No further nutrition interventions warranted at this time.  If additional nutrition issues arise, please re-consult RD.  Arna Better, MS, RD, LDN Inpatient Clinical Dietitian Contact via Secure chat

## 2023-07-26 NOTE — Plan of Care (Signed)

## 2023-07-26 NOTE — Progress Notes (Signed)
 Mobility Specialist - Progress Note   07/26/23 1001  Mobility  Activity Ambulated independently in hallway  Level of Assistance Independent  Assistive Device None  Distance Ambulated (ft) 500 ft  Activity Response Tolerated well  Mobility Referral Yes  Mobility visit 1 Mobility  Mobility Specialist Start Time (ACUTE ONLY) 0954  Mobility Specialist Stop Time (ACUTE ONLY) 1000  Mobility Specialist Time Calculation (min) (ACUTE ONLY) 6 min   Pt received in bed and agreeable to mobility. No complaints during session. Pt to bed after session with all needs met.    [facility]

## 2023-07-26 NOTE — Progress Notes (Addendum)
 PROGRESS NOTE    Patient: [name]                            PCP: Corbett Desanctis T, PA-C                    DOB: [birth year]            DOA: 07/25/2023 ZOX:[id number]             DOS: 07/26/2023, 12:14 PM   LOS: 1 day   Date of Service: The patient was seen and examined on 07/26/2023  Subjective:   The patient was seen and examined this morning. Hemodynamically stable. Still complaining of lower abdominal pain with palpation. No bowel movement yet No issues overnight .  Brief Narrative:   [name] is a  68 year old man history of CAD, hypertension, and hyperlipidemia presented to emergency department complaining for left-sided flank pain sudden onset at 1pm today. No urinary symptoms. No history of kidney stones. Nothing taken for pain prior to arrival. Does some lifting for work but denies any recent lifting today.  No recent diarrhea, no fever, chills.   ED:  patient is hemodynamically stable. CBC showing leukocytosis 11.6 otherwise unremarkable. BMP unremarkable except elevated creatinine 1.52. UA showing positive ketone and protein positive no evidence of UTI.  CT abdomen pelvis showed: Diverticulitis of the distal descending and proximal sigmoid colon. There are questionable foci of free air adjacent to the colon in the left lower quadrant, possible microperforations. No abscess is seen. 2. Hyperdense material the renal pyramids bilaterally, possible medullary calcinosis. No obstructive uropathy bilaterally. 3. Aortic atherosclerosis and coronary artery calcification.  In the ED patient has been treated with ceftriaxone, metronidazole, morphine , Toradol  and 1 L of NS bolus.  ED physician discussed case with general surgery Dr. Elvan Hamel he was not very impressed regarding the microperforation recommended to consult if needed.  Hospitalist has been consulted for further management of diverticulitis with microperforation.    Assessment & Plan:   Principal  Problem:   Acute diverticulitis Active Problems:   Type 2 diabetes mellitus with complication, without long-term current use of insulin (HCC)   Essential hypertension   Hyperlipidemia with target low density lipoprotein (LDL) cholesterol less than 70 mg/dL   Chronic myeloid leukemia (HCC)   History of prostate cancer   CAD S/P percutaneous coronary angioplasty       Acute diverticulitis  - Hemodynamically stable, no signs of sepsis, continue to have abdominal pain  Diverticulitis of the descending colon and proximal sigmoid colon with possible microperforation  - Will continue empiric IV antibiotics -Surgery consulted, appreciate further evaluation and input  -N.p.o. - Gentle IV fluids.  IV pain medications.    -Patient has had a colonoscopy in June 2021 at [facility].  CAD status post stenting/with extensive history of MIs in the past -Denies of having any chest pain or shortness of breath -Last Echo in 2024 March.   Takes aspirin  Repatha statins beta-blockers.   NPO.  Hypertension  -will keep patient on as needed IV hydralazine .   Takes metoprolol  and Benicar  at home.  Acute renal failure  -resolved, responded to IVF  History of CML  -follows with oncologist at [facility].  On Scemblix 40 mg twice daily.  Type 2 diabetes mentioned in the chart.   -A1c.  Patient is not on any medications.      -------------------------------------------------------------------------------------------------------------------- Nutritional status:  The  patient's BMI is: Body mass index is 26.88 kg/m. I agree with the assessment and plan as outlined below: Nutrition Status:      ------------------------------------------------------------------------------------------------------------------  DVT prophylaxis:  SCDs Start: 07/26/23 0210   Code Status:   Code Status: Full Code  Family Communication: No family member present at bedside--Advance care planning has been  discussed.   Admission status:   Status is: Inpatient Remains inpatient appropriate because: Monitoring, general surgery evaluation for possible surgical invention, continue IV antibiotics   Disposition: From  - home             Planning for discharge in 1-2 days: to Home  Is to stay in hospital for close observation surgical evaluation IV antibiotics  Procedures:   No admission procedures for hospital encounter.   Antimicrobials:  Anti-infectives (From admission, onward)    Start     Dose/Rate Route Frequency Ordered Stop   07/26/23 0800  piperacillin-tazobactam (ZOSYN) IVPB 3.375 g        3.375 g 12.5 mL/hr over 240 Minutes Intravenous Every 8 hours 07/26/23 0223     07/25/23 2015  cefTRIAXone (ROCEPHIN) 2 g in sodium chloride  0.9 % 100 mL IVPB       Placed in "And" Linked Group   2 g 200 mL/hr over 30 Minutes Intravenous  Once 07/25/23 2003 07/25/23 2211   07/25/23 2015  metroNIDAZOLE (FLAGYL) IVPB 500 mg       Placed in "And" Linked Group   500 mg 100 mL/hr over 60 Minutes Intravenous  Once 07/25/23 2003 07/25/23 2131        Medication:   insulin aspart  0-6 Units Subcutaneous TID WC   sennosides  10 mL Oral BID    acetaminophen , hydrALAZINE , HYDROmorphone  (DILAUDID ) injection, ondansetron    Objective:   Vitals:   07/25/23 2246 07/26/23 0236 07/26/23 0642 07/26/23 1120  BP: (!) 158/101 (!) 153/97 (!) 157/99 (!) 148/92  Pulse: 69 72 73 69  Resp: 14 14 14 17   Temp: 98.4 F (36.9 C) 97.6 F (36.4 C) 97.7 F (36.5 C) 98.1 F (36.7 C)  TempSrc: Oral Oral Oral Oral  SpO2: 98% 100% 95% 98%  Weight: 80.2 kg     Height:        Intake/Output Summary (Last 24 hours) at 07/26/2023 1214 Last data filed at 07/26/2023 0300 Gross per 24 hour  Intake 1227.03 ml  Output --  Net 1227.03 ml   Filed Weights   07/25/23 1605 07/25/23 2246  Weight: 77.9 kg 80.2 kg     Physical examination:   Constitution:  Alert, cooperative, no distress,  Appears calm and  comfortable  Psychiatric:   Normal and stable mood and affect, cognition intact,   HEENT:        Normocephalic, PERRL, otherwise with in Normal limits  Chest:         Chest symmetric Cardio vascular:  S1/S2, RRR, No murmure, No Rubs or Gallops  pulmonary: Clear to auscultation bilaterally, respirations unlabored, negative wheezes / crackles Abdomen: Soft, bilateral lower quadrant tenderness with deep palpation  non-distended, bowel sounds,no masses, no organomegaly Muscular skeletal: Limited exam - in bed, able to move all 4 extremities,   Neuro: CNII-XII intact. , normal motor and sensation, reflexes intact  Extremities: No pitting edema lower extremities, +2 pulses  Skin: Dry, warm to touch, negative for any Rashes, No open wounds Wounds: per nursing documentation   ------------------------------------------------------------------------------------------------------------------------------------------    LABs:     Latest Ref Rng & Units 07/26/2023  5:58 AM 07/25/2023    6:34 PM 05/25/2022    8:50 AM  CBC  WBC 4.0 - 10.5 K/uL 10.6  11.6  11.1   Hemoglobin 13.0 - 17.0 g/dL 52.8  41.3  24.4   Hematocrit 39.0 - 52.0 % 41.2  42.1  45.1   Platelets 150 - 400 K/uL 232  258  243       Latest Ref Rng & Units 07/26/2023    5:58 AM 07/25/2023    6:34 PM 05/25/2022    8:50 AM  CMP  Glucose 70 - 99 mg/dL 93  010  272   BUN 8 - 23 mg/dL 20  17  16    Creatinine 0.61 - 1.24 mg/dL 5.36  6.44  0.34   Sodium 135 - 145 mmol/L 139  141  137   Potassium 3.5 - 5.1 mmol/L 4.3  4.7  3.5   Chloride 98 - 111 mmol/L 109  107  105   CO2 22 - 32 mmol/L 25  24  23    Calcium  8.9 - 10.3 mg/dL 8.4  9.3  9.0   Total Protein 6.5 - 8.1 g/dL 6.0     Total Bilirubin 0.0 - 1.2 mg/dL 0.3     Alkaline Phos 38 - 126 U/L 51     AST 15 - 41 U/L 14     ALT 0 - 44 U/L 16          Micro Results Recent Results (from the past 240 hours)  Culture, blood (Routine X 2) w Reflex to ID Panel     Status: None  (Preliminary result)   Collection Time: 07/26/23  5:58 AM   Specimen: BLOOD RIGHT ARM  Result Value Ref Range Status   Specimen Description   Final    BLOOD RIGHT ARM Performed at [facility], [address], [place], [place] [address]    Special Requests   Final    BOTTLES DRAWN AEROBIC AND ANAEROBIC Blood Culture results may not be optimal due to an inadequate volume of blood received in culture bottles Performed at [facility], [address], [place], [place] [address]    Culture PENDING  Incomplete   Report Status PENDING  Incomplete  Culture, blood (Routine X 2) w Reflex to ID Panel     Status: None (Preliminary result)   Collection Time: 07/26/23  5:58 AM   Specimen: BLOOD RIGHT HAND  Result Value Ref Range Status   Specimen Description   Final    BLOOD RIGHT HAND Performed at [facility], [address], [place], [place] [address]    Special Requests   Final    BOTTLES DRAWN AEROBIC ONLY Blood Culture results may not be optimal due to an inadequate volume of blood received in culture bottles Performed at [facility], [address], [place], [place] [address]    Culture PENDING  Incomplete   Report Status PENDING  Incomplete    Radiology Reports DG Abd 1 View Result Date: 07/26/2023 CLINICAL DATA:  Abdominal pain EXAM: ABDOMEN - 1 VIEW COMPARISON:  Abdominal CT from yesterday FINDINGS: The bowel gas pattern is normal. No radio-opaque calculi or other significant radiographic abnormality are seen. No abnormal gas collection by supine technique. Stable lumbar dextrocurvature. Mild atheromatous calcification. IMPRESSION: Normal bowel gas pattern. Electronically Signed   By: Ronnette Coke M.D.   On: 07/26/2023 07:22   CT Renal Stone Study Addendum Date: 07/25/2023 ADDENDUM REPORT: 07/25/2023 19:46  ADDENDUM: Critical Value/emergent results were called by telephone at the time of interpretation on 07/25/2023 at 7:45 pm to  provider [facility] , who verbally acknowledged these results. Electronically Signed   By: Wyvonnia Heimlich M.D.   On: 07/25/2023 19:46   Result Date: 07/25/2023 CLINICAL DATA:  Left abdominal/flank pain, stone suspected. EXAM: CT ABDOMEN AND PELVIS WITHOUT CONTRAST TECHNIQUE: Multidetector CT imaging of the abdomen and pelvis was performed following the standard protocol without IV contrast. RADIATION DOSE REDUCTION: This exam was performed according to the departmental dose-optimization program which includes automated exposure control, adjustment of the mA and/or kV according to patient size and/or use of iterative reconstruction technique. COMPARISON:  01/27/2020. FINDINGS: Lower chest: Coronary artery calcifications are present. The lung bases are clear. Hepatobiliary: No focal liver abnormality is seen. No gallstones, gallbladder wall thickening, or biliary dilatation. Pancreas: Unremarkable. No pancreatic ductal dilatation or surrounding inflammatory changes. Spleen: Normal in size without focal abnormality. Adrenals/Urinary Tract: Adrenal glands are within normal limits. Hyperdense regions are present at the medullary pyramids. No obstructive uropathy is seen bilaterally. The bladder is unremarkable. Stomach/Bowel: Stomach is within normal limits. Appendix appears normal. Scattered diverticula are present along the colon. There is pericolonic fat stranding involving the distal descending colon and proximal sigmoid colon, compatible with diverticulitis. There are a few questionable foci of free air adjacent to the sigmoid colon in the left lower quadrant, possible micro perforation. No abscess is seen. Vascular/Lymphatic: Aortic atherosclerosis. No enlarged abdominal or pelvic lymph nodes. Reproductive: Prostate gland is not visualized. Other: No abdominopelvic ascites. Fat containing umbilical hernia is present. Musculoskeletal: Degenerative changes are noted in the thoracolumbar spine. No acute  osseous abnormality. IMPRESSION: 1. Findings compatible with diverticulitis involving the distal descending colon and proximal sigmoid colon. There are questionable foci of free air adjacent to the colon in the left lower quadrant, possible microperforations. No abscess is seen. 2. Hyperdense material the renal pyramids bilaterally, possible medullary calcinosis. No obstructive uropathy bilaterally. 3. Aortic atherosclerosis and coronary artery calcification. Electronically Signed: By: Wyvonnia Heimlich M.D. On: 07/25/2023 19:40    SIGNED: Bobbetta Burnet, MD, FHM. FAAFP. Arlin Benes - [facility] hospital[facility] Time spent - 55 min.  In seeing, evaluating and examining the patient. Reviewing medical records, labs, drawn plan of care. [facility],  Pager (please use [email or web address] to page/ text) Please use Epic Secure Chat for non-urgent communication (7AM-7PM)  If 7PM-7AM, please contact night-coverage [email or web address], 07/26/2023, 12:14 PM

## 2023-07-27 DIAGNOSIS — K5792 Diverticulitis of intestine, part unspecified, without perforation or abscess without bleeding: Secondary | ICD-10-CM | POA: Diagnosis not present

## 2023-07-27 LAB — CBC
HCT: 47.1 % (ref 39.0–52.0)
Hemoglobin: 15.9 g/dL (ref 13.0–17.0)
MCH: 31.8 pg (ref 26.0–34.0)
MCHC: 33.8 g/dL (ref 30.0–36.0)
MCV: 94.2 fL (ref 80.0–100.0)
Platelets: 261 10*3/uL (ref 150–400)
RBC: 5 MIL/uL (ref 4.22–5.81)
RDW: 14 % (ref 11.5–15.5)
WBC: 10.3 10*3/uL (ref 4.0–10.5)
nRBC: 0 % (ref 0.0–0.2)

## 2023-07-27 LAB — GLUCOSE, CAPILLARY: Glucose-Capillary: 102 mg/dL — ABNORMAL HIGH (ref 70–99)

## 2023-07-27 MED ORDER — SENNOSIDES 8.8 MG/5ML PO SYRP: 10.0000 mL | ORAL_SOLUTION | Freq: Two times a day (BID) | ORAL | 0 refills | Status: DC

## 2023-07-27 MED ORDER — METRONIDAZOLE 500 MG PO TABS: 500.0000 mg | ORAL_TABLET | Freq: Three times a day (TID) | ORAL | 0 refills | Status: AC

## 2023-07-27 MED ORDER — LACTATED RINGERS IV SOLN: INTRAVENOUS | Status: DC

## 2023-07-27 MED ORDER — CIPROFLOXACIN HCL 500 MG PO TABS: 500.0000 mg | ORAL_TABLET | Freq: Two times a day (BID) | ORAL | 0 refills | Status: AC

## 2023-07-27 MED ORDER — LACTINEX PO CHEW: 1.0000 | CHEWABLE_TABLET | Freq: Three times a day (TID) | ORAL | 0 refills | Status: AC

## 2023-07-27 NOTE — Progress Notes (Signed)
 Subjective/Chief Complaint: Patient denies abdominal pain.  Passing gas.  Patient is hungry.   Objective: Vital signs in last 24 hours: Temp:  [97.6 F (36.4 C)-98.4 F (36.9 C)] 98.4 F (36.9 C) (05/24 0544) Pulse Rate:  [64-75] 75 (05/24 0544) Resp:  [17-18] 18 (05/24 0544) BP: (141-157)/(92-103) 141/103 (05/24 0544) SpO2:  [97 %-100 %] 100 % (05/24 0544) Last BM Date : 07/24/23  Intake/Output from previous day: 05/23 0701 - 05/24 0700 In: 2440.3 [P.O.:918; I.V.:1383.4; IV Piggyback:138.9] Out: -  Intake/Output this shift: No intake/output data recorded.  Abdomen: Soft with minimal left lower quadrant tenderness.  No rebound or guarding.  Lab Results:  Recent Labs    07/25/23 1834 07/26/23 0558  WBC 11.6* 10.6*  HGB 14.6 13.9  HCT 42.1 41.2  PLT 258 232   BMET Recent Labs    07/25/23 1834 07/26/23 0558  NA 141 139  K 4.7 4.3  CL 107 109  CO2 24 25  GLUCOSE 120* 93  BUN 17 20  CREATININE 1.52* 1.10  CALCIUM  9.3 8.4*   PT/INR No results for input(s): "LABPROT", "INR" in the last 72 hours. ABG No results for input(s): "PHART", "HCO3" in the last 72 hours.  Invalid input(s): "PCO2", "PO2"  Studies/Results: DG Abd 1 View Result Date: 07/26/2023 CLINICAL DATA:  Abdominal pain EXAM: ABDOMEN - 1 VIEW COMPARISON:  Abdominal CT from yesterday FINDINGS: The bowel gas pattern is normal. No radio-opaque calculi or other significant radiographic abnormality are seen. No abnormal gas collection by supine technique. Stable lumbar dextrocurvature. Mild atheromatous calcification. IMPRESSION: Normal bowel gas pattern. Electronically Signed   By: Ronnette Coke M.D.   On: 07/26/2023 07:22   CT Renal Stone Study Addendum Date: 07/25/2023 ADDENDUM REPORT: 07/25/2023 19:46 ADDENDUM: Critical Value/emergent results were called by telephone at the time of interpretation on 07/25/2023 at 7:45 pm to provider [facility] , who verbally acknowledged these results.  Electronically Signed   By: Wyvonnia Heimlich M.D.   On: 07/25/2023 19:46   Result Date: 07/25/2023 CLINICAL DATA:  Left abdominal/flank pain, stone suspected. EXAM: CT ABDOMEN AND PELVIS WITHOUT CONTRAST TECHNIQUE: Multidetector CT imaging of the abdomen and pelvis was performed following the standard protocol without IV contrast. RADIATION DOSE REDUCTION: This exam was performed according to the departmental dose-optimization program which includes automated exposure control, adjustment of the mA and/or kV according to patient size and/or use of iterative reconstruction technique. COMPARISON:  01/27/2020. FINDINGS: Lower chest: Coronary artery calcifications are present. The lung bases are clear. Hepatobiliary: No focal liver abnormality is seen. No gallstones, gallbladder wall thickening, or biliary dilatation. Pancreas: Unremarkable. No pancreatic ductal dilatation or surrounding inflammatory changes. Spleen: Normal in size without focal abnormality. Adrenals/Urinary Tract: Adrenal glands are within normal limits. Hyperdense regions are present at the medullary pyramids. No obstructive uropathy is seen bilaterally. The bladder is unremarkable. Stomach/Bowel: Stomach is within normal limits. Appendix appears normal. Scattered diverticula are present along the colon. There is pericolonic fat stranding involving the distal descending colon and proximal sigmoid colon, compatible with diverticulitis. There are a few questionable foci of free air adjacent to the sigmoid colon in the left lower quadrant, possible micro perforation. No abscess is seen. Vascular/Lymphatic: Aortic atherosclerosis. No enlarged abdominal or pelvic lymph nodes. Reproductive: Prostate gland is not visualized. Other: No abdominopelvic ascites. Fat containing umbilical hernia is present. Musculoskeletal: Degenerative changes are noted in the thoracolumbar spine. No acute osseous abnormality. IMPRESSION: 1. Findings compatible with diverticulitis  involving the distal descending colon  and proximal sigmoid colon. There are questionable foci of free air adjacent to the colon in the left lower quadrant, possible microperforations. No abscess is seen. 2. Hyperdense material the renal pyramids bilaterally, possible medullary calcinosis. No obstructive uropathy bilaterally. 3. Aortic atherosclerosis and coronary artery calcification. Electronically Signed: By: Wyvonnia Heimlich M.D. On: 07/25/2023 19:40    Anti-infectives: Anti-infectives (From admission, onward)    Start     Dose/Rate Route Frequency Ordered Stop   07/26/23 0800  piperacillin-tazobactam (ZOSYN) IVPB 3.375 g        3.375 g 12.5 mL/hr over 240 Minutes Intravenous Every 8 hours 07/26/23 0223     07/25/23 2015  cefTRIAXone (ROCEPHIN) 2 g in sodium chloride  0.9 % 100 mL IVPB       Placed in "And" Linked Group   2 g 200 mL/hr over 30 Minutes Intravenous  Once 07/25/23 2003 07/25/23 2211   07/25/23 2015  metroNIDAZOLE (FLAGYL) IVPB 500 mg       Placed in "And" Linked Group   500 mg 100 mL/hr over 60 Minutes Intravenous  Once 07/25/23 2003 07/25/23 2131       Assessment/Plan: Expand All Collapse All            Consult Note   [name] 05/18/1955  [id number].     Requesting MD: Amber Bail, MD Chief Complaint/Reason for Consult: Diverticulitis with microperforation  HPI:  Patient is a 68 year old male who presented to the ED with abdominal pain that started yesterday. Pain is along left flank/back. Denies associated nausea, vomiting, diarrhea, fever, chills, or urinary symptoms. He is not passing flatus this AM and has not had a BM but did have a BM yesterday. Denies blood or mucus in stool. Generally has a BM daily. He has a known history of diverticulosis on prior colonoscopy but this is his first episode of diverticulitis. PMH otherwise significant for CAD s/p stent most recently in march of last year, CML now in remission, prostate cancer s/p prostatectomy,  HTN, and HLD. He was taken off Brilinta  by cardiology last month. He is very active and goes to the gym 4-5x per week. Allergy to contrast dye. He owns his own business.    ROS: Negative other than HPI        Family History  Problem Relation Age of Onset   Cancer Mother          Liver              Past Medical History:  Diagnosis Date   Arthritis     Bladder disorder     Cancer (HCC)      hx prostate cancer and CML   Chronic myelocytic leukemia (HCC)     Coronary artery disease      with prior MI 2002([facility]) and Cfx(2006) and RCA (2004) stents . EF 45-50%   Coronary atherosclerosis of native coronary artery      stable and suspect a component of CAS   Diverticulosis      seen on colonoscopy in 2008   Goals of care, counseling/discussion 09/13/2016   H/O colonoscopy 2014   Hyperlipidemia     Hypertension     Myocardial infarction [facility]) 02,6/12   Prostate cancer [facility])                 Past Surgical History:  Procedure Laterality Date   CARDIAC CATHETERIZATION        normal EF, patent stents without obstructive disease (on Plavix   X 1 month only)   CAROTID STENT       CORONARY BALLOON ANGIOPLASTY N/A 05/23/2022    Procedure: CORONARY BALLOON ANGIOPLASTY;  Surgeon: Lucendia Rusk, MD;  Location: [facility] INVASIVE CV LAB;  Service: Cardiovascular;  Laterality: N/A;   CORONARY PRESSURE/FFR STUDY N/A 05/23/2022    Procedure: INTRAVASCULAR PRESSURE WIRE/FFR STUDY;  Surgeon: Lucendia Rusk, MD;  Location: [facility] INVASIVE CV LAB;  Service: Cardiovascular;  Laterality: N/A;   FRACTURE SURGERY        rt wrist   LEFT HEART CATH AND CORONARY ANGIOGRAPHY N/A 05/23/2022    Procedure: LEFT HEART CATH AND CORONARY ANGIOGRAPHY;  Surgeon: Lucendia Rusk, MD;  Location: [facility] INVASIVE CV LAB;  Service: Cardiovascular;  Laterality: N/A;   LEFT HEART CATHETERIZATION WITH CORONARY ANGIOGRAM N/A 02/04/2012    Procedure: LEFT HEART CATHETERIZATION WITH CORONARY ANGIOGRAM;  Surgeon: Mickiel Albany, MD;  Location: [facility] CATH LAB;  Service: Cardiovascular;  Laterality: N/A;   ORIF ANKLE FRACTURE   07/04/2011    Procedure: OPEN REDUCTION INTERNAL FIXATION (ORIF) ANKLE FRACTURE;  Surgeon: Boston Byers, MD;  Location: [facility];  Service: Orthopedics;  Laterality: Right;   TOE SURGERY        rt great toe   TONSILLECTOMY       WRIST SURGERY        rt and lt cysts removed          Social History:  reports that he quit smoking about 23 years ago. His smoking use included cigarettes. He has never used smokeless tobacco. He reports that he does not drink alcohol and does not use drugs.   Allergies:  Allergies       Allergies  Allergen Reactions   Contrast Media [Iodinated Contrast Media] Hives      "a few hives; not a lot"              Medications Prior to Admission  Medication Sig Dispense Refill   acetaminophen  (TYLENOL ) 650 MG CR tablet Take 1,300 mg by mouth daily as needed for pain.       ascorbic acid (VITAMIN C) 500 MG tablet Take 500 mg by mouth daily.       aspirin  81 MG chewable tablet Chew 1 tablet (81 mg total) by mouth daily. 30 tablet 0   azithromycin (ZITHROMAX) 250 MG tablet Take 250 mg by mouth 3 (three) times a week. Monday, Tuesday, wednesday       cholecalciferol (VITAMIN D) 1000 UNITS tablet Take 3,000 Units by mouth daily.       Coenzyme Q10 (COQ-10) 200 MG CAPS Take 200 mg by mouth daily.       darifenacin  (ENABLEX ) 15 MG 24 hr tablet Take 15 mg by mouth daily.        DEXILANT 60 MG capsule TAKE 1 CAPSULE BY MOUTH ONCE DAILY 90 capsule 1   ezetimibe  (ZETIA ) 10 MG tablet Take 10 mg by mouth daily.       famotidine  (PEPCID ) 20 MG tablet Take 20 mg by mouth daily.       hydrOXYzine  (ATARAX /VISTARIL ) 25 MG tablet Take 1 tablet (25 mg total) by mouth 2 (two) times daily. (Patient taking differently: Take 50 mg by mouth daily.) 90 tablet 0   isosorbide  mononitrate (IMDUR ) 60 MG 24 hr tablet TAKE 1 TABLET BY MOUTH ONCE DAILY 90 tablet 3    metoprolol  succinate (TOPROL -XL) 50 MG 24 hr tablet Take 1.5 tablets (75 mg total)  by mouth daily. Take with or immediately following a meal. (Patient taking differently: Take 50 mg by mouth daily. Take with or immediately following a meal.) 135 tablet 2   MYRBETRIQ  50 MG TB24 tablet Take 50 mg by mouth daily.       nitroGLYCERIN  (NITROSTAT ) 0.4 MG SL tablet Place 1 tablet (0.4 mg total) under the tongue every 5 (five) minutes as needed for chest pain. 25 tablet 12   olmesartan  (BENICAR ) 20 MG tablet Take 2 tablets (40 mg total) by mouth daily. (Patient taking differently: Take 20 mg by mouth daily.) 90 tablet 3   Omega-3 Fatty Acids (FISH OIL PO) Take 1 capsule by mouth daily.       oxybutynin (DITROPAN-XL) 5 MG 24 hr tablet Take 1 tablet by mouth daily.       OZEMPIC, 0.25 OR 0.5 MG/DOSE, 2 MG/3ML SOPN Inject 0.25 mg into the skin every 14 (fourteen) days.       REPATHA SURECLICK 140 MG/ML SOAJ Inject 1 mL into the skin every 14 (fourteen) days.       rosuvastatin  (CRESTOR ) 40 MG tablet Take 1 tablet (40 mg total) by mouth daily. 90 tablet 3   VITAMIN E PO Take 1 tablet by mouth daily.       mupirocin ointment (BACTROBAN) 2 % Apply topically 2 (two) times daily. (Patient not taking: Reported on 07/26/2023)       ticagrelor  (BRILINTA ) 90 MG TABS tablet Take 1 tablet (90 mg total) by mouth 2 (two) times daily. (Patient not taking: Reported on 07/26/2023) 60 tablet 0          Blood pressure (!) 148/92, pulse 69, temperature 98.1 F (36.7 C), temperature source Oral, resp. rate 17, height 5\' 8"  (1.727 m), weight 80.2 kg, SpO2 98%. Physical Exam:  General: pleasant, WD, WN male who is laying in bed in NAD HEENT: head is normocephalic, atraumatic.  Sclera are noninjected.  Pupils equal and round.  Ears and nose without any masses or lesions.  Mouth is pink and moist Heart: regular, rate, and rhythm.  Palpable radial and pedal pulses bilaterally Lungs: No wheezes, rhonchi, or rales noted.  Respiratory  effort nonlabored Abd: soft, mild ttp of left flank without peritonitis, ND, no masses, hernias, or organomegaly. Prior surgical scars from prostatectomy  MS: all 4 extremities are symmetrical with no cyanosis, clubbing, or edema. Skin: warm and dry with no masses, lesions, or rashes Neuro: Cranial nerves 2-12 grossly intact, sensation is normal throughout Psych: A&Ox3 with an appropriate affect.     Lab Results Last 48 Hours        Results for orders placed or performed during the hospital encounter of 07/25/23 (from the past 48 hours)  Urinalysis, Routine w reflex microscopic -Urine, Clean Catch     Status: Abnormal    Collection Time: 07/25/23  4:09 PM  Result Value Ref Range    Color, Urine YELLOW YELLOW    APPearance CLEAR CLEAR    Specific Gravity, Urine 1.029 1.005 - 1.030    pH 5.5 5.0 - 8.0    Glucose, UA NEGATIVE NEGATIVE mg/dL    Hgb urine dipstick NEGATIVE NEGATIVE    Bilirubin Urine NEGATIVE NEGATIVE    Ketones, ur TRACE (A) NEGATIVE mg/dL    Protein, ur TRACE (A) NEGATIVE mg/dL    Nitrite NEGATIVE NEGATIVE    Leukocytes,Ua NEGATIVE NEGATIVE      Comment: Performed at [facility], [address] [place], [place] [address]  CBC  Status: Abnormal    Collection Time: 07/25/23  6:34 PM  Result Value Ref Range    WBC 11.6 (H) 4.0 - 10.5 K/uL    RBC 4.58 4.22 - 5.81 MIL/uL    Hemoglobin 14.6 13.0 - 17.0 g/dL    HCT 45.4 09.8 - 11.9 %    MCV 91.9 80.0 - 100.0 fL    MCH 31.9 26.0 - 34.0 pg    MCHC 34.7 30.0 - 36.0 g/dL    RDW 14.7 82.9 - 56.2 %    Platelets 258 150 - 400 K/uL    nRBC 0.0 0.0 - 0.2 %      Comment: Performed at [facility], [address] [place], [place] [address]  Basic metabolic panel     Status: Abnormal    Collection Time: 07/25/23  6:34 PM  Result Value Ref Range    Sodium 141 135 - 145 mmol/L    Potassium 4.7 3.5 - 5.1 mmol/L      Comment: HEMOLYSIS AT THIS LEVEL MAY AFFECT RESULT    Chloride 107  98 - 111 mmol/L    CO2 24 22 - 32 mmol/L    Glucose, Bld 120 (H) 70 - 99 mg/dL      Comment: Glucose reference range applies only to samples taken after fasting for at least 8 hours.    BUN 17 8 - 23 mg/dL    Creatinine, Ser 5.78 (H) 0.61 - 1.24 mg/dL    Calcium  9.3 8.9 - 10.3 mg/dL    GFR, Estimated 50 (L) >60 mL/min      Comment: (NOTE) Calculated using the CKD-EPI Creatinine Equation (2021)      Anion gap 11 5 - 15      Comment: Performed at [facility], [address], [place], [place] [address]  Glucose, capillary     Status: Abnormal    Collection Time: 07/26/23  2:38 AM  Result Value Ref Range    Glucose-Capillary 103 (H) 70 - 99 mg/dL      Comment: Glucose reference range applies only to samples taken after fasting for at least 8 hours.  Culture, blood (Routine X 2) w Reflex to ID Panel     Status: None (Preliminary result)    Collection Time: 07/26/23  5:58 AM    Specimen: BLOOD RIGHT ARM  Result Value Ref Range    Specimen Description          BLOOD RIGHT ARM Performed at [facility], [address] [place], [place] [address]      Special Requests          BOTTLES DRAWN AEROBIC AND ANAEROBIC Blood Culture results may not be optimal due to an inadequate volume of blood received in culture bottles Performed at [facility], [address] [place], [place] [address]      Culture PENDING      Report Status PENDING    Culture, blood (Routine X 2) w Reflex to ID Panel     Status: None (Preliminary result)    Collection Time: 07/26/23  5:58 AM    Specimen: BLOOD RIGHT HAND  Result Value Ref Range    Specimen Description          BLOOD RIGHT HAND Performed at [facility], [address], [place], [place] [address]      Special Requests          BOTTLES DRAWN AEROBIC ONLY Blood Culture results may not  be optimal due to an inadequate volume of blood received in culture bottles Performed at [facility], [address], [place], [place] [address]      Culture PENDING      Report Status PENDING    Hemoglobin A1c     Status: None    Collection Time: 07/26/23  5:58 AM  Result Value Ref Range    Hgb A1c MFr Bld 5.6 4.8 - 5.6 %      Comment: (NOTE) Pre diabetes:          5.7%-6.4%   Diabetes:              >6.4%   Glycemic control for   <7.0% adults with diabetes      Mean Plasma Glucose 114.02 mg/dL      Comment: Performed at [facility], [address], [place], [place] [address]  Basic metabolic panel     Status: Abnormal    Collection Time: 07/26/23  5:58 AM  Result Value Ref Range    Sodium 139 135 - 145 mmol/L    Potassium 4.3 3.5 - 5.1 mmol/L    Chloride 109 98 - 111 mmol/L    CO2 25 22 - 32 mmol/L    Glucose, Bld 93 70 - 99 mg/dL      Comment: Glucose reference range applies only to samples taken after fasting for at least 8 hours.    BUN 20 8 - 23 mg/dL    Creatinine, Ser 8.29 0.61 - 1.24 mg/dL    Calcium  8.4 (L) 8.9 - 10.3 mg/dL    GFR, Estimated >56 >21 mL/min      Comment: (NOTE) Calculated using the CKD-EPI Creatinine Equation (2021)      Anion gap 5 5 - 15      Comment: Performed at [facility], [address] [place], [place] [address]  Hepatic function panel     Status: Abnormal    Collection Time: 07/26/23  5:58 AM  Result Value Ref Range    Total Protein 6.0 (L) 6.5 - 8.1 g/dL    Albumin 3.3 (L) 3.5 - 5.0 g/dL    AST 14 (L) 15 - 41 U/L    ALT 16 0 - 44 U/L    Alkaline Phosphatase 51 38 - 126 U/L    Total Bilirubin 0.3 0.0 - 1.2 mg/dL    Bilirubin, Direct 0.1 0.0 - 0.2 mg/dL    Indirect Bilirubin 0.2 (L) 0.3 - 0.9 mg/dL      Comment: Performed at [facility], [address], [place], [place] [address]  CBC with Differential/Platelet     Status: Abnormal    Collection Time: 07/26/23  5:58 AM  Result Value Ref Range    WBC 10.6 (H) 4.0 - 10.5 K/uL    RBC 4.32 4.22 - 5.81 MIL/uL    Hemoglobin 13.9  13.0 - 17.0 g/dL    HCT 62.9 52.8 - 41.3 %    MCV 95.4 80.0 - 100.0 fL    MCH 32.2 26.0 - 34.0 pg    MCHC 33.7 30.0 - 36.0 g/dL    RDW 24.4 01.0 - 27.2 %    Platelets 232 150 - 400 K/uL    nRBC 0.0 0.0 - 0.2 %    Neutrophils Relative % 62 %    Neutro Abs 6.7 1.7 - 7.7 K/uL    Lymphocytes Relative 27 %    Lymphs Abs 2.9 0.7 -  4.0 K/uL    Monocytes Relative 8 %    Monocytes Absolute 0.9 0.1 - 1.0 K/uL    Eosinophils Relative 1 %    Eosinophils Absolute 0.1 0.0 - 0.5 K/uL    Basophils Relative 1 %    Basophils Absolute 0.1 0.0 - 0.1 K/uL    Immature Granulocytes 1 %    Abs Immature Granulocytes 0.07 0.00 - 0.07 K/uL      Comment: Performed at [facility], [address], [place], [place] [address]  Glucose, capillary     Status: None    Collection Time: 07/26/23  7:47 AM  Result Value Ref Range    Glucose-Capillary 99 70 - 99 mg/dL      Comment: Glucose reference range applies only to samples taken after fasting for at least 8 hours.       Imaging Results (Last 48 hours)  DG Abd 1 View Result Date: 07/26/2023 CLINICAL DATA:  Abdominal pain EXAM: ABDOMEN - 1 VIEW COMPARISON:  Abdominal CT from yesterday FINDINGS: The bowel gas pattern is normal. No radio-opaque calculi or other significant radiographic abnormality are seen. No abnormal gas collection by supine technique. Stable lumbar dextrocurvature. Mild atheromatous calcification. IMPRESSION: Normal bowel gas pattern. Electronically Signed   By: Ronnette Coke M.D.   On: 07/26/2023 07:22    CT Renal Stone Study Addendum Date: 07/25/2023 ADDENDUM REPORT: 07/25/2023 19:46 ADDENDUM: Critical Value/emergent results were called by telephone at the time of interpretation on 07/25/2023 at 7:45 pm to provider [facility] , who verbally acknowledged these results. Electronically Signed   By: Wyvonnia Heimlich M.D.   On: 07/25/2023 19:46    Result Date: 07/25/2023 CLINICAL DATA:  Left abdominal/flank pain, stone  suspected. EXAM: CT ABDOMEN AND PELVIS WITHOUT CONTRAST TECHNIQUE: Multidetector CT imaging of the abdomen and pelvis was performed following the standard protocol without IV contrast. RADIATION DOSE REDUCTION: This exam was performed according to the departmental dose-optimization program which includes automated exposure control, adjustment of the mA and/or kV according to patient size and/or use of iterative reconstruction technique. COMPARISON:  01/27/2020. FINDINGS: Lower chest: Coronary artery calcifications are present. The lung bases are clear. Hepatobiliary: No focal liver abnormality is seen. No gallstones, gallbladder wall thickening, or biliary dilatation. Pancreas: Unremarkable. No pancreatic ductal dilatation or surrounding inflammatory changes. Spleen: Normal in size without focal abnormality. Adrenals/Urinary Tract: Adrenal glands are within normal limits. Hyperdense regions are present at the medullary pyramids. No obstructive uropathy is seen bilaterally. The bladder is unremarkable. Stomach/Bowel: Stomach is within normal limits. Appendix appears normal. Scattered diverticula are present along the colon. There is pericolonic fat stranding involving the distal descending colon and proximal sigmoid colon, compatible with diverticulitis. There are a few questionable foci of free air adjacent to the sigmoid colon in the left lower quadrant, possible micro perforation. No abscess is seen. Vascular/Lymphatic: Aortic atherosclerosis. No enlarged abdominal or pelvic lymph nodes. Reproductive: Prostate gland is not visualized. Other: No abdominopelvic ascites. Fat containing umbilical hernia is present. Musculoskeletal: Degenerative changes are noted in the thoracolumbar spine. No acute osseous abnormality. IMPRESSION: 1. Findings compatible with diverticulitis involving the distal descending colon and proximal sigmoid colon. There are questionable foci of free air adjacent to the colon in the left lower  quadrant, possible microperforations. No abscess is seen. 2. Hyperdense material the renal pyramids bilaterally, possible medullary calcinosis. No obstructive uropathy bilaterally. 3. Aortic atherosclerosis and coronary artery calcification. Electronically Signed: By: Wyvonnia Heimlich M.D. On: 07/25/2023 19:40  Assessment/Plan Acute descending diverticulitis with questionable microperforation - CT with above, no abscess noted - WBC 10.6 from 11.6, afebrile and HD stable  - Patient nontender.  Advance diet to soft diet.  No acute surgical indication  Okay for discharge from surgical standpoint  Patient to be discharged on p.o. antibiotics with  follow-up instructions - agree with IV abx, can DC on PO augmentin  to complete 10 days total course - would recommend follow up with GI outpatient and discussion of repeat colonoscopy in a few weeks - if patient should worsen acutely would recommend repeat CTAP 5 days from last scan  - would not necessarily recommend consideration of elective colectomy with this being first episode of diverticulitis but if patient would like to discuss further after GI follow up [facility] would be happy to see him back in the office    FEN: Soft diet, IVF per [facility] VTE: ok to have SQH or LMWH from surgical standpoint ID: Zosyn 5/22>>   - per [facility] -  Hx of CAD s/p stents in 05/2022, off Brilinta  as of last month Hx of CML now in remission Hx of prostate cancer s/p resection  HTN     LOS: 2 days    [name] 07/27/2023 Low complexity

## 2023-07-27 NOTE — Plan of Care (Signed)

## 2023-07-27 NOTE — Discharge Summary (Signed)
 Physician Discharge Summary   Patient: [name] MRN: [id number] DOB: 10/04/1955  Admit date:     07/25/2023  Discharge date: 07/27/23  Discharge Physician: Bobbetta Burnet   PCP: Virl Grimes, PA-C   Recommendations at discharge:   Follow-up with the PCP in 2-4 weeks Follow-up with gastroenterologist in 2-4 weeks Advance your diet slowly from clear-to soft diet introduce solid slowly over the next 1-2 weeks  Discharge Diagnoses: Principal Problem:   Acute diverticulitis Active Problems:   Type 2 diabetes mellitus with complication, without long-term current use of insulin (HCC)   Essential hypertension   Hyperlipidemia with target low density lipoprotein (LDL) cholesterol less than 70 mg/dL   Chronic myeloid leukemia (HCC)   History of prostate cancer   CAD S/P percutaneous coronary angioplasty  Resolved Problems:   * No resolved hospital problems. *  Hospital Course: [name] is a  67 year old man history of CAD, hypertension, and hyperlipidemia presented to emergency department complaining for left-sided flank pain sudden onset at 1pm today. No urinary symptoms. No history of kidney stones. Nothing taken for pain prior to arrival. Does some lifting for work but denies any recent lifting today.  No recent diarrhea, no fever, chills.   ED:  patient is hemodynamically stable. CBC showing leukocytosis 11.6 otherwise unremarkable. BMP unremarkable except elevated creatinine 1.52. UA showing positive ketone and protein positive no evidence of UTI.  CT abdomen pelvis showed: Diverticulitis of the distal descending and proximal sigmoid colon. There are questionable foci of free air adjacent to the colon in the left lower quadrant, possible microperforations. No abscess is seen. 2. Hyperdense material the renal pyramids bilaterally, possible medullary calcinosis. No obstructive uropathy bilaterally. 3. Aortic atherosclerosis and coronary artery  calcification.  In the ED patient has been treated with ceftriaxone, metronidazole, morphine , Toradol  and 1 L of NS bolus.  ED physician discussed case with general surgery Dr. Elvan Hamel he was not very impressed regarding the microperforation recommended to consult if needed.  Hospitalist has been consulted for further management of diverticulitis with microperforation.      Acute diverticulitis  - Hemodynamically stable, no signs of sepsis, continue to have abdominal pain  Diverticulitis of the descending colon and proximal sigmoid colon with possible microperforation -consult, evaluate the patient and imaging , no concerns  - IV antibiotics-tolerating p.o., switching to p.o. -Surgery consulted, no surgical invention recommended -S/p IV fluid hydration -Patient has had a colonoscopy in June 2021 at [facility].  Prescribed Rx ciprofloxacin plus Flagyl p.o.  CAD status post stenting/with extensive history of MIs in the past -Denies of having any chest pain or shortness of breath -Last Echo in 2024 March.   Takes aspirin  Repatha statins beta-blockers.     Hypertension -stable continue home medication Takes metoprolol  and Benicar  at home.  Acute renal failure  -resolved, responded to IVF  History of CML  -follows with oncologist at [facility].  On Scemblix 40 mg twice daily.  Type 2 diabetes mentioned in the chart.  -Strict diet recommended -A1c.  Patient is not on any medications.      Consultants: General Surgery Procedures performed: CT abdomen pelvis Disposition: Home Diet recommendation:  Discharge Diet Orders (From admission, onward)     Start     Ordered   07/27/23 0000  Diet - low sodium heart healthy        07/27/23 0955           Clear liquid diet, advance as tolerated DISCHARGE  MEDICATION: Allergies as of 07/27/2023       Reactions   Contrast Media [iodinated Contrast Media] Hives   "a few hives; not a lot"        Medication List      STOP taking these medications    ascorbic acid 500 MG tablet Commonly known as: VITAMIN C   azithromycin 250 MG tablet Commonly known as: ZITHROMAX   Brilinta  90 MG Tabs tablet Generic drug: ticagrelor    famotidine  20 MG tablet Commonly known as: PEPCID    mupirocin ointment 2 % Commonly known as: BACTROBAN   VITAMIN E PO       TAKE these medications    acetaminophen  650 MG CR tablet Commonly known as: TYLENOL  Take 1,300 mg by mouth daily as needed for pain.   Aspirin  Low Dose 81 MG chewable tablet Generic drug: aspirin  Chew 1 tablet (81 mg total) by mouth daily.   cholecalciferol 1000 units tablet Commonly known as: VITAMIN D Take 3,000 Units by mouth daily.   ciprofloxacin 500 MG tablet Commonly known as: Cipro Take 1 tablet (500 mg total) by mouth 2 (two) times daily for 7 days.   CoQ-10 200 MG Caps Take 200 mg by mouth daily.   darifenacin  15 MG 24 hr tablet Commonly known as: ENABLEX  Take 15 mg by mouth daily.   Dexilant 60 MG capsule Generic drug: dexlansoprazole TAKE 1 CAPSULE BY MOUTH ONCE DAILY   ezetimibe  10 MG tablet Commonly known as: ZETIA  Take 10 mg by mouth daily.   FISH OIL PO Take 1 capsule by mouth daily.   hydrOXYzine  25 MG tablet Commonly known as: ATARAX  Take 1 tablet (25 mg total) by mouth 2 (two) times daily. What changed:  how much to take when to take this   isosorbide  mononitrate 60 MG 24 hr tablet Commonly known as: IMDUR  TAKE 1 TABLET BY MOUTH ONCE DAILY   lactobacillus acidophilus & bulgar chewable tablet Chew 1 tablet by mouth 3 (three) times daily with meals for 10 days.   metoprolol  succinate 50 MG 24 hr tablet Commonly known as: TOPROL -XL Take 1.5 tablets (75 mg total) by mouth daily. Take with or immediately following a meal. What changed: how much to take   metroNIDAZOLE 500 MG tablet Commonly known as: FLAGYL Take 1 tablet (500 mg total) by mouth 3 (three) times daily for 10 days.   Myrbetriq  50 MG  Tb24 tablet Generic drug: mirabegron  ER Take 50 mg by mouth daily.   nitroGLYCERIN  0.4 MG SL tablet Commonly known as: NITROSTAT  Place 1 tablet (0.4 mg total) under the tongue every 5 (five) minutes as needed for chest pain.   olmesartan  20 MG tablet Commonly known as: BENICAR  Take 2 tablets (40 mg total) by mouth daily. What changed: how much to take   oxybutynin 5 MG 24 hr tablet Commonly known as: DITROPAN-XL Take 1 tablet by mouth daily.   Ozempic (0.25 or 0.5 MG/DOSE) 2 MG/3ML Sopn Generic drug: Semaglutide(0.25 or 0.5MG /DOS) Inject 0.25 mg into the skin every 14 (fourteen) days.   Repatha SureClick 140 MG/ML Soaj Generic drug: Evolocumab Inject 1 mL into the skin every 14 (fourteen) days.   rosuvastatin  40 MG tablet Commonly known as: CRESTOR  Take 1 tablet (40 mg total) by mouth daily.   sennosides 8.8 MG/5ML syrup Commonly known as: SENOKOT Take 10 mLs by mouth 2 (two) times daily.        Discharge Exam: Filed Weights   07/25/23 1605 07/25/23 2246  Weight: 77.9 kg  80.2 kg        General:  AAO x 3,  cooperative, no distress;   HEENT:  Normocephalic, PERRL, otherwise with in Normal limits   Neuro:  CNII-XII intact. , normal motor and sensation, reflexes intact   Lungs:   Clear to auscultation BL, Respirations unlabored,  No wheezes / crackles  Cardio:    S1/S2, RRR, No murmure, No Rubs or Gallops   Abdomen:  Soft, non-tender, bowel sounds active all four quadrants, no guarding or peritoneal signs.  Muscular  skeletal:  Limited exam -global generalized weaknesses - in bed, able to move all 4 extremities,   2+ pulses,  symmetric, No pitting edema  Skin:  Dry, warm to touch, negative for any Rashes,  Wounds: Please see nursing documentation          Condition at discharge: good  The results of significant diagnostics from this hospitalization (including imaging, microbiology, ancillary and laboratory) are listed below for reference.   Imaging  Studies: DG Abd 1 View Result Date: 07/26/2023 CLINICAL DATA:  Abdominal pain EXAM: ABDOMEN - 1 VIEW COMPARISON:  Abdominal CT from yesterday FINDINGS: The bowel gas pattern is normal. No radio-opaque calculi or other significant radiographic abnormality are seen. No abnormal gas collection by supine technique. Stable lumbar dextrocurvature. Mild atheromatous calcification. IMPRESSION: Normal bowel gas pattern. Electronically Signed   By: Ronnette Coke M.D.   On: 07/26/2023 07:22   CT Renal Stone Study Addendum Date: 07/25/2023 ADDENDUM REPORT: 07/25/2023 19:46 ADDENDUM: Critical Value/emergent results were called by telephone at the time of interpretation on 07/25/2023 at 7:45 pm to provider [facility] , who verbally acknowledged these results. Electronically Signed   By: Wyvonnia Heimlich M.D.   On: 07/25/2023 19:46   Result Date: 07/25/2023 CLINICAL DATA:  Left abdominal/flank pain, stone suspected. EXAM: CT ABDOMEN AND PELVIS WITHOUT CONTRAST TECHNIQUE: Multidetector CT imaging of the abdomen and pelvis was performed following the standard protocol without IV contrast. RADIATION DOSE REDUCTION: This exam was performed according to the departmental dose-optimization program which includes automated exposure control, adjustment of the mA and/or kV according to patient size and/or use of iterative reconstruction technique. COMPARISON:  01/27/2020. FINDINGS: Lower chest: Coronary artery calcifications are present. The lung bases are clear. Hepatobiliary: No focal liver abnormality is seen. No gallstones, gallbladder wall thickening, or biliary dilatation. Pancreas: Unremarkable. No pancreatic ductal dilatation or surrounding inflammatory changes. Spleen: Normal in size without focal abnormality. Adrenals/Urinary Tract: Adrenal glands are within normal limits. Hyperdense regions are present at the medullary pyramids. No obstructive uropathy is seen bilaterally. The bladder is unremarkable. Stomach/Bowel:  Stomach is within normal limits. Appendix appears normal. Scattered diverticula are present along the colon. There is pericolonic fat stranding involving the distal descending colon and proximal sigmoid colon, compatible with diverticulitis. There are a few questionable foci of free air adjacent to the sigmoid colon in the left lower quadrant, possible micro perforation. No abscess is seen. Vascular/Lymphatic: Aortic atherosclerosis. No enlarged abdominal or pelvic lymph nodes. Reproductive: Prostate gland is not visualized. Other: No abdominopelvic ascites. Fat containing umbilical hernia is present. Musculoskeletal: Degenerative changes are noted in the thoracolumbar spine. No acute osseous abnormality. IMPRESSION: 1. Findings compatible with diverticulitis involving the distal descending colon and proximal sigmoid colon. There are questionable foci of free air adjacent to the colon in the left lower quadrant, possible microperforations. No abscess is seen. 2. Hyperdense material the renal pyramids bilaterally, possible medullary calcinosis. No obstructive uropathy bilaterally. 3. Aortic atherosclerosis and coronary artery  calcification. Electronically Signed: By: Wyvonnia Heimlich M.D. On: 07/25/2023 19:40    Microbiology: Results for orders placed or performed during the hospital encounter of 07/25/23  Culture, blood (Routine X 2) w Reflex to ID Panel     Status: None (Preliminary result)   Collection Time: 07/26/23  5:58 AM   Specimen: BLOOD RIGHT ARM  Result Value Ref Range Status   Specimen Description   Final    BLOOD RIGHT ARM Performed at [facility], [address], [place], [place] [address]    Special Requests   Final    BOTTLES DRAWN AEROBIC AND ANAEROBIC Blood Culture results may not be optimal due to an inadequate volume of blood received in culture bottles Performed at [facility], [address] [place], [place] [address]    Culture   Final    NO GROWTH <  24 HOURS Performed at [facility], [address] [place], [place] [address]    Report Status PENDING  Incomplete  Culture, blood (Routine X 2) w Reflex to ID Panel     Status: None (Preliminary result)   Collection Time: 07/26/23  5:58 AM   Specimen: BLOOD RIGHT HAND  Result Value Ref Range Status   Specimen Description   Final    BLOOD RIGHT HAND Performed at [facility], [address], [place], [place] [address]    Special Requests   Final    BOTTLES DRAWN AEROBIC ONLY Blood Culture results may not be optimal due to an inadequate volume of blood received in culture bottles Performed at [facility], [address], [place], [place] [address]    Culture   Final    NO GROWTH < 24 HOURS Performed at [facility], [address] [place], [place] [address]    Report Status PENDING  Incomplete    Labs: CBC: Recent Labs  Lab 07/25/23 1834 07/26/23 0558 07/27/23 0711  WBC 11.6* 10.6* 10.3  NEUTROABS  --  6.7  --   HGB 14.6 13.9 15.9  HCT 42.1 41.2 47.1  MCV 91.9 95.4 94.2  PLT 258 232 261   Basic Metabolic Panel: Recent Labs  Lab 07/25/23 1834 07/26/23 0558  NA 141 139  K 4.7 4.3  CL 107 109  CO2 24 25  GLUCOSE 120* 93  BUN 17 20  CREATININE 1.52* 1.10  CALCIUM  9.3 8.4*   Liver Function Tests: Recent Labs  Lab 07/26/23 0558  AST 14*  ALT 16  ALKPHOS 51  BILITOT 0.3  PROT 6.0*  ALBUMIN 3.3*   CBG: Recent Labs  Lab 07/26/23 0238 07/26/23 0747 07/26/23 1722 07/26/23 2201 07/27/23 0706  GLUCAP 103* 99 157* 88 102*    Discharge time spent: greater than 40  minutes.  Signed: Bobbetta Burnet, MD [facility] 07/27/2023

## 2023-07-31 LAB — CULTURE, BLOOD (ROUTINE X 2)
Culture: NO GROWTH
Culture: NO GROWTH

## 2023-12-26 NOTE — Progress Notes (Signed)
 Patient Name: [name] Patient Date of Birth: [birth year] Patient MR#: [id number]  PCP: Tari ONEIDA Banter, PA-C Date: 12/26/2023    [facility]   Assessment and Plan:    1. NSTEMI (non-ST elevated myocardial infarction)    (CMD)      2. S/P angioplasty in stent stenosis LCFx 05/24/2022      3. Coronary artery disease involving native coronary artery of native heart without angina pectoris      4. Essential hypertension      5. Dyslipidemia, goal LDL below 70            ASSESSMENT & PLAN:  CAD- status post MI in 2002, treated with BMS stenting to ramus followed by overlapping stent with DES to Ramus in 2004, DES to RCA 2006 with evidence of old nonviable lateral infarct on echocardiogram with EF 45 to 50% and no ischemia on July 2021 stress echo - Now s/p NSTEMI of LCFx stent restenosis 05/24/22. Completed asa brilenta thru 05/24/23. Doing well, can improve secondary prevention we discussed goals- exercise 150 min weekly, diet guidance provided  Mild ischemic CM- Stage B Class I- asymptomatic, stable EF 40-45% from prior infarct, unchanged on 11/2023 TTE [45-50% prior]. Cont metoprolol  succinate 25mg  daily, imdur  and olmesartan .   Dyslipidemia- Cont zetia  10 mg daily and crestor  40mg  daily. LDL 43 on 08/2022, is at goal of < than 70.  06/2023 switched off of Crestor  to pravastatin due to med interaction.  LDL now 87 on pravastatin 40 mg daily and Zetia  10 mg daily.  Adding Repatha 140 mg subcu every 2 weeks.  12/2023 LDL 90 on 11/2023, renewed repatha and encouraged better compliance.   Hypertension- BP's well controlled. Can remain off beta-blockers provided his EF remains in a normal range.   Cont imdur  30mg  daily, and olmesartan  40mg  daily.   Sleep apnea- I asked him to reconsider sleep testing to further reduce CV risk- may be a candidate for Inspire device.  FOLLOW UP: The patient will come back to see me in 6  mo  Thank you for your kind referral. I appreciate being able to take care of your patient.  Erna Ardelia Creighton MD   Subjective:    Reason for Consult/Visit: No chief complaint on file.    History of Present Illness: [name] is a 68 y.o. male who presents for hosp f/u 06/26/2022. Known CAD, s/p NSTEMI 05/24/22 treated w PTCA of left circumflex, peak troponin less than 2000.  EF 45 to 50% grade 1 diastolic dysfunction.  Went back to the hospital 2 days later with continuing chest pain troponins were continuing to downtrend ECG benign he was reassured that this was anxiety related.    He has a history of CAD status post MI in 2002, treated with BMS stenting to ramus followed by overlapping stent with DES to Ramus in 2004, DES to RCA 2006. Reports additional chest pain in 2013 for which he underwent coronary angiography as follows:   HEMODYNAMICS: Aortic pressure was 112/76 mmHg; LV pressure was 113/12 mmHg; LVEDP 19 mm mercury. There was no gradient between the left ventricle and aorta.  ANGIOGRAPHIC DATA: The left main coronary artery is widely patent.  The left anterior descending artery is dominant. It wraps around the left ventricular apex. Distal vessel intramyocardial segment with systolic compression is noted. 2 large diagonals contain minimal ostial disease. No significant obstruction is seen throughout the LAD territory.  The  ramus intermedius contains proximal/ostial overlapping stents. This region is widely patent with less than 50% narrowing noted at the site of overlap. The vessel bifurcates distal to the stented proximal region..  The left circumflex artery is small and widely patent.  The right coronary artery is dominant giving the PDA and 3 small left ventricular branches. Irregularities are noted throughout the mid segment. The mid RCA stent is widely patent.  LEFT VENTRICULOGRAM: Left ventricular angiogram was done in the 30 RAO projection and revealed  normal left ventricular wall motion and systolic function with an estimated ejection fraction of 50%.  IMPRESSIONS: 1. Acute coronary syndrome likely related to coronary artery spasm and/or transient thrombotic event. No high grade obstructive disease is noted.  2. Widely patent ramus intermedius stents. Widely patent mid right coronary stent.  3. Low normal left ventricular function  RECOMMENDATION: 1. Long-acting nitrates will be continued and plavix  added for 1 month.    He established with Dr. Eliza last year who obtained an echocardiogram and may of 2020 which showed mild lateral hypokinesis and a low normal ejection fraction of 50 to 55%.  Prior EF documented at 45 to 50% at [facility].   Due to exertional dyspnea, a stress echo was performed in June 2021, which showed an old nonviable lateral infarct, and no ischemia.   He offers no cardiac complaints today.  He has not been performing regular exercise.  Denies recurrent angina.  Has some nausea after starting semaglutide versus diabetes.   Prior anginal symptoms of left anterior chest tightness with SOB. Initial MI described as intense chest tightness with tightness across entire chest and bilat arm tightness, SOB, diaphoretic and mildly nauseated.  He has had no recurrence of that.   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 He has noted increasing dyspnea with activities of lifting without associated chest tightness nausea or diaphoresis.   Of note he has had recurrence of startle awakening and has had recurrent sleep apnea develop.  He had had this in the past when he was 250 pounds.  Since he was unable to tolerate CPAP mask he lost weight down to 170 or so but he is recently regained it with recurrence of sleep apnea.   Has not checked BPs recently but in the past they were 120/80, but recently upto 120/90.   Pt denies orthopnea or lower extremity  edema. Pt denies a personal history of chf or cardiomyopathy, nor is there a family history of chf or cardiomyopathy. Pt denies syncope, presyncope, or sustained rapid palpitations with hemodynamic symptoms. The pt denies a personal history of nor is there is a F Hx of significant sustained ventricular arrhythmia, sudden cardiac death [SCD], or aborted cardiac arrest {ACA].     05/24/20: At last visit we discussed intensifying diet and exercise rechecking lipids and if still not at goal to add Zetia  with plan to transition to a PCSK9 inhibitor if lipids still not to goal with high intensity statin and Zetia .   Most recent lipids show LDL still over 100 with a goal of less than 70. Recently diagnosed as type II DM, has lost weight since to reduce risk. Still isnt exercising regularly.   Note to have BP 108/80 off of metop [he missed that AM dose- asked to stay off it until f/u here].     June 20, 2021:   Offers no cardiac complaints.  Denies any recurrence of  angina, has not developed heart failure or arrhythmia symptoms.  Most recent lipids show LDL less than 70 at 67.   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  He basically sleeps when he can.  June 26, 2022:  S/p NSTEMI, trop <2K, ptcs in stent LCFx 05/24/22. In rehab no symptoms, had to increase activity level there. Some fatigue after metop increased to 75mg  and imdur  increased to 60mg  daily.  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   01/01/23  Stopped metop due to continuing fatigue in June 2024. Lipids at goal.  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  BPs at goal at 120/70 at home.  07/02/23  Doing well no anginal, heart failure or arrhythmia symptoms.   Still working out with a systems analyst 5 days a week.  BPs at home are normotensive.  Last EF 40 to 45% October 2024.  Crestor  was changed to pravastatin October 2024 due to new medicine interaction.  LDL increased from 43-87 with this change as expected.  Reviewed with patient, and reviewed plan to add PCSK9 I.  I added Repatha today.   12/26/2023  Feels well from a cardiac standpoint no anginal heart failure or arrhythmia symptoms.  Has had 3 LOC episodes determined to be due to seizures.  Following at [facility] for this so far the feeling is that the seizures were due to sleep deprivation.  Heart rate is fast and irregular on exam today.  Denies any palpitations feeling of tachycardia or presyncope.  Reports his diastolic BPs have been elevated recently to the 90-100 range.  Pertinent cardiac testing results:  TTE 11/2023  SUMMARY  The left ventricular size is normal. There is mild concentric left  ventricular hypertrophy with  inferior, inferolateral and anterolateral hypokinesis consistent with  prior MI. Left ventricular  systolic function is mildly reduced. LV ejection fraction = 40-45%.  Reduced LV Global L Strain =-13.4%.  The left ventricular diastolic function is probably normal for age, with  likely normal left atrial  pressure.  The right ventricle is normal in size and function.  The atria are normal in size.  There is no significant valvular stenosis or regurgitation.  No pulmonary hypertension. Estimated right ventricular systolic pressure  is 18 mmHg.  Normal IVC size and collapsibility; Right atrial pressure is estimated to  be 3 mm Hg.  There is no significant change in comparison with the last study.   Cardiac cath 05/23/22  Prox RCA lesion is 50% stenosed. Tortuous portion of the vessel which has progressed since 2013. Mid LAD lesion is 25% stenosed. 2nd Diag lesion is 25% stenosed. Prox LAD lesion is 50% stenosed. RFR 0.95. This area has progressed since 2013. Ost  Cx to Prox Cx lesion is 90% stenosed. Scoring balloon angioplasty was performed using a BALLN WOLVERINE 2.50X10. Post intervention, there is a 10% residual stenosis. The left ventricular systolic function is normal. LV end diastolic pressure is normal. The left ventricular ejection fraction is 55-65% by visual estimate. There is no aortic valve stenosis. Tortuosity noted in the right subclavian. Somewhat difficult to torque catheters without the benefit of a destination sheath which was not available today.  Successful PTCA of the severe in-stent  restenosis of the proximal circumflex stent. Continue with dual antiplatelet therapy for 12 months. Continue aggressive secondary prevention. Results conveyed to the patient's son, [name] [phone]. Plan for discharge tomorrow.   Recent Labs: 05/24/2022: ALT 21; Magnesium 1.9 05/25/2022: BUN 16; Creatinine, Ser 1.15; Hemoglobin 16.2; Platelets 243; Potassium 3.5; Sodium 137  Recent Lipid Panel  Component Value Date/Time  CHOL 104 05/24/2022 0147  CHOL 161 12/19/2017 1059  TRIG 38 05/24/2022 0147  HDL 38 (L) 05/24/2022 0147  HDL 51 12/19/2017 1059  CHOLHDL 2.7 05/24/2022 0147  VLDL 8 05/24/2022 0147  LDLCALC 58 05/24/2022 0147  LDLCALC 97 12/19/2017 1059  The following portions of the patient's history were reviewed and updated as appropriate:  Medical History: Past Medical History:  Diagnosis Date  . Acid reflux   . Arthritis   . Cancer    (CMD)    Leukemia (myeloid leukemia) and Prostate Cancer  . Diabetes mellitus   . High cholesterol   . History of heart attack 05/23/2022  . Hypertension   . Seizure    (CMD)      Family History: Family History  Problem Relation Name Age of Onset  . Diabetes Mother    . Stroke Father    . Dementia Father    . Dementia Paternal Grandmother    . Heart attack Paternal Grandmother    . Dementia Paternal Grandfather    . Heart disease Paternal Grandfather    . Seizures Neg Hx       Medications:  Current Outpatient Medications  Medication Sig Dispense Refill  . acetaminophen  (TYLENOL ) 650 mg ER tablet Take 1,300 mg by mouth.    . alpha tocopherol (VITAMIN E) 670 mg (1,000 unit) capsule Take 1,000 Units by mouth daily.    [name] ascorbic acid (VITAMIN C) 1,000 mg tablet Take 1,000 mg by mouth.    . aspirin  81 mg EC tablet Take 81 mg by mouth daily.    . cholecalciferol (VITAMIN D3) 1,000 unit (25 mcg) tablet Take 5,000 Units by mouth.    . coenzyme Q-10 100 mg capsule Take 200 mg by mouth.    . darifenacin  (ENABLEX ) 15 mg 24 hr tablet Take 15 mg by mouth daily.    [name] Dexilant 60 mg DR capsule Take 1 capsule (60 mg total) by mouth daily. 90 capsule 2  . ezetimibe  (ZETIA ) 10 mg tablet Take 1 tablet by mouth once daily 30 tablet 6  . hydrALAZINE  (APRESOLINE ) 25 mg tablet Take 1 tablet (25 mg total) by mouth 3 (three) times a day. 270 tablet 3  . hydrOXYzine  (ATARAX ) 25 mg tablet Take 1 tablet by mouth twice daily as needed 60 tablet 0  . ibuprofen (MOTRIN) 600 mg tablet Take 600 mg by mouth every 6 (six) hours as needed for mild pain (1-3).    . isosorbide  mononitrate (IMDUR ) 30 mg 24 hr tablet Take 2 tablets (60 mg total) by mouth daily. (Patient taking differently: Take 30 mg by mouth daily.) 30 tablet 6  . levETIRAcetam (KEPPRA) 500 mg tablet Take 1 tablet (500 mg total) by mouth 2 (two) times a day. 60 tablet 1  . meloxicam (MOBIC) 15 mg tablet Take 15 mg by mouth daily as needed for moderate pain (4-6).    . metoprolol  succinate (TOPROL  XL) 25 mg 24 hr tablet Take 1 tablet (25 mg total) by mouth daily. 90 tablet 3  . naproxen (NAPROSYN) 500 mg tablet Take 1 tablet (500 mg total) by mouth in the morning and  1 tablet (500 mg total) in the evening. Take with meals. 180 tablet 3  . nitroglycerin  (NITROSTAT ) 0.4 mg SL tablet Place 0.4 mg under the tongue every 5 (five) minutes as needed for chest pain.    . olmesartan  (BENICAR ) 40 mg tablet Take 40 mg by mouth daily.    [name]  oxyBUTYnin (DITROPAN XL) 5 mg 24 hr tablet Take 1 tablet by mouth daily.    . pravastatin (PRAVACHOL) 40 mg tablet Take 1 tablet (40 mg total) by mouth daily. 90 tablet 3  . triamcinolone  acetonide (KENALOG ) 0.1 % cream Apply topically 2 (two) times a day. 15 g 0  . Myrbetriq  50 mg Tb24 Take 50 mg by mouth daily.    [name] omega 3-dha-epa-fish oil 183.3 mg-75 mg -91.6 mg-306 mg cap     . sucralfate (CARAFATE) 100 mg/mL oral suspension      No current facility-administered medications for this visit.    Allergies: Allergies  Allergen Reactions  . Iodinated Contrast Media Hives and Other (See Comments)    a few hives; not a lot     Social History: Social History   Tobacco Use  Smoking Status Former  . Current packs/day: 0.00  . Average packs/day: 0.5 packs/day for 30.0 years (15.0 ttl pk-yrs)  . Types: Cigarettes  . Start date: 66  . Quit date: 2002  . Years since quitting: 23.8  . Passive exposure: Never  Smokeless Tobacco Never  Tobacco Comments   Need LS form completed    Social History   Substance and Sexual Activity  Alcohol Use Yes   Comment: occ   Social History   Substance and Sexual Activity  Drug Use Yes  . Types: Marijuana    Review of Systems: A complete ROS performed with pertinent positives as per HPI      Objective:      Physical Exam:  VITAL SIGNS:  Blood pressure 105/73, pulse 106, height 1.727 m (5' 8), weight 79.3 kg (174 lb 14.4 oz), SpO2 96%. Wt Readings from Last 3 Encounters:  12/26/23 79.3 kg (174 lb 14.4 oz)  12/09/23 78.3 kg (172 lb 9.6 oz)  11/18/23 79.4 kg (175 lb 1.6 oz)   Body mass index is 26.59 kg/m.  General: WD WN AA man in NAD   HEENT:   PERRL, EOMI.  Oropharynx is moist. No icterus.   Neck: Carotid pulses are 2+.  No bruits.  No adenopathy, thyromegaly or masses.  Jugular venous pressure is normal. No HJR   Lungs:   Respirations are unlabored. The lungs are clear in all fields bilaterally without rales, rhonchi or  wheezing.    Percussion note is normal throughout.   Heart:  Heart sounds are regular rate and rhythm  Normal S1. Normal S2.  No S3 or S4.  No R/M/G   Abdomen:   Soft, nontender and nondistended.  There are normal bowel sounds.  No bruits.  No masses or organomegaly.    Extremities: Warm. No rashes or ulcers. No lower extremity pitting edema.   Pulses: Radial DP and PT pulses are 2+ and symmetrical.   Skin: No lower extremity rashes or ulcers   Neurologic:  The patient is awake, alert, and oriented to time, place, person and situation, and no strength deficits.     EKG: currently unable to view 05/2022 ECG  Lab Review: Lab Results  Component Value Date   WBC 8.26 12/09/2023   RBC 4.92 12/09/2023   HGB 16.1 12/09/2023   HCT  46.3 12/09/2023   MCV 94.1 12/09/2023   MCH 32.7 12/09/2023   MCHC 34.7 12/09/2023   RDW 13.7 12/09/2023   PLT 259 12/09/2023   MPV 7.3 12/09/2023  . Lab Results  Component Value Date   NA 139 12/09/2023   K 4.2 12/09/2023   CL 103 12/09/2023   CO2 31 12/09/2023   BUN 15 12/09/2023   CREATININE 1.27 12/09/2023   EGFR 62 12/09/2023   GLUCOSE 119 (H) 12/09/2023   Lab Results  Component Value Date   HGBA1C 6.4 (H) 11/18/2023      CVD RISK STRATIFICATION RESULTS: Lab Results  Component Value Date   CHOL 159 11/18/2023   HDL 56 (L) 11/18/2023   TRIG 51 11/18/2023   HGBA1C 6.4 (H) 11/18/2023   I spent 30 minutes with patient; more than 50% of the time was spent counseling patient and arranging treatment.  The ASCVD Risk score (Arnett DK, et al., 2019) failed to calculate for the following reasons:   Risk score cannot be calculated because patient has a medical history suggesting prior/existing ASCVD

## 2024-01-16 NOTE — Progress Notes (Signed)
 Plastic and Reconstructive Surgery Clinic Note  Chief Complaint: No chief complaint on file.    Referred by: No ref. provider found  History of Present Illness:  Subjective  [name] is a 68 y.o. male who presents for continued management of bilateral Th/MF triggers and right radioscaphoid arthritis previously treated with steroid injections. He most recently underwent radiocarpal injection 3 months ago and bilateral MF trigger injections 6 weeks ago. He presents today noting good relief of trigger finger symptoms but his right wrist pain has returned following increase weight lifting in the gym. The pain is worse with use of the hand and wrist, improved with rest.    Past Medical History:  Past Medical History:  Diagnosis Date  . Acid reflux   . Arthritis   . Cancer    (CMD)    Leukemia (myeloid leukemia) and Prostate Cancer  . Diabetes mellitus   . High cholesterol   . History of heart attack 05/23/2022  . Hypertension   . Seizure    (CMD)     Problem List Patient Active Problem List  Diagnosis  . Type 2 diabetes mellitus without complication, without long-term current use of insulin     (CMD)  . Nuclear sclerotic cataract of both eyes  . Mixed stress and urge incontinence  . GERD (gastroesophageal reflux disease)  . History of prostate cancer  . Dyslipidemia, goal LDL below 70  . Essential hypertension  . Erectile dysfunction after radical prostatectomy  . Diverticulosis  . Cortical age-related cataract, both eyes  . Coronary artery disease  . Chronic myeloid leukemia    (CMD)  . Cervical spinal stenosis  . Arthritis  . NSTEMI (non-ST elevated myocardial infarction)    (CMD)  . S/P angioplasty in stent stenosis LCFx 05/24/2022  . Chest pain  . Diverticular disease of colon  . Leukocytosis  . Periumbilical pain  . Acute low back pain with radicular symptoms, duration less than 6 weeks  . Chronic right-sided low back pain with right-sided sciatica  . Trigger  finger of right thumb  . Trigger finger of left thumb  . Trigger middle finger of right hand  . Trigger middle finger of left hand  . Right wrist pain  . Loss of consciousness    (CMD)  . Nuclear sclerotic cataract of right eye  . Cortical age-related cataract of right eye  . Nuclear sclerotic cataract of left eye  . Cortical age-related cataract of left eye  . Generalized seizure disorder    (CMD)    Past Surgical History: Past Surgical History:  Procedure Laterality Date  . ANKLE SURGERY Right    trauma  . CERVICAL EPIDURAL STEROID INJECTION    . CORONARY ANGIOPLASTY WITH STENT PLACEMENT    . GANGLION CYST EXCISION Right   . PROSTATE SURGERY  2006  . REPAIR / REINSERT BICEPS TENDON AT ELBOW Right     Social History: Social History   Tobacco Use  . Smoking status: Former    Current packs/day: 0.00    Average packs/day: 0.5 packs/day for 30.0 years (15.0 ttl pk-yrs)    Types: Cigarettes    Start date: 19    Quit date: 2002    Years since quitting: 23.8    Passive exposure: Never  . Smokeless tobacco: Never  . Tobacco comments:    Need LS form completed   Substance Use Topics  . Alcohol use: Yes    Comment: occ    Family History: family history includes Dementia in  his father, paternal grandfather, and paternal grandmother; Diabetes in his mother; Heart attack in his paternal grandmother; Heart disease in his paternal grandfather; Stroke in his father.  Allergies: Allergies  Allergen Reactions  . Iodinated Contrast Media Hives and Other (See Comments)    a few hives; not a lot     Medications: has a current medication list which includes the following prescription(s): acetaminophen , alpha tocopherol, ascorbic acid, aspirin , cholecalciferol, coenzyme q-10, darifenacin , dexilant, evolocumab, ezetimibe , hydralazine , hydroxyzine , ibuprofen, isosorbide  mononitrate, levetiracetam, meloxicam, metoprolol  succinate, myrbetriq , naproxen, nitroglycerin , olmesartan ,  omega 3-dha-epa-fish oil, oxybutynin, pravastatin, sucralfate, and triamcinolone  acetonide.  Review of Systems: A 14 point review of systems was obtained and negative unless otherwise stated in the HPI  Physical Examination:  vitals were not taken for this visit.  There is no height or weight on file to calculate BMI.  Gen: well appearing male in no acute distress Psych: alert, normal mood/affect Neuro: follows commands, CN VII function symmetric Eyes: pupils equal, round ENT:  mucous membranes moist and pink Resp: breathing comfortably on room air, non-labored, no distress CV: no clubbing, no cyanosis Skin: warm, no systemic rashes Extremity: Full range of motion of all digits TTP overlying right MF A1 pulley  Pain worse with compression and simultaneous motion Palpable nodule in the flexor tendon near the A1 pulley Normal sensation in all finger tips Brisk capillary refill in all digits TTP overlying right radioscaphoid joint  Imaging: 3 Views (PA, Obl, Lat) Right Wrist 07/18/23  Visible joint space narrowing of right radioscaphoid joint with periarticular sclerosis, first CMC arthritis  Assessment and Plan:  Problem List Items Addressed This Visit   None    68 y.o. male with bilateral Th/MF trigger finger, right RS arthritis -Pertaining to the right wrist arthritis, he has elected to proceed with steroid injection after discussion of risks and benefits -Regarding trigger digits, continue with observation as his symptoms have resolved   Medium joint arthrocentesis: R radiocarpal on 01/16/2024 8:30 AM Indications: pain Details: 25 G needle, dorsal approach Medications: 10 mg lidocaine  10 mg/mL (1 %); 40 mg triamcinolone  acetonide 40 mg/mL Outcome: tolerated well, no immediate complications Procedure, treatment alternatives, risks and benefits explained, specific risks discussed. Consent was given by the patient. Immediately prior to procedure a time out was called to  verify the correct patient, procedure, equipment, support staff and site/side marked as required. Patient was prepped and draped in the usual sterile fashion.        Total Trigger Finger Injections Right Th - 1 IF -  MF - 3 RF - SF -  Left Th - 1 IF - MF - 2 RF - SF -  Ozell JULIANNA Hock, MD  Plastic & Reconstructive Surgery Hand & Upper Extremity Surgery [facility] 01/16/2024 7:51 AM

## 2024-02-06 ENCOUNTER — Telehealth: Payer: Self-pay | Admitting: Physician Assistant

## 2024-02-06 ENCOUNTER — Other Ambulatory Visit: Payer: Self-pay | Admitting: Urology

## 2024-02-06 NOTE — Telephone Encounter (Signed)
   Pre-operative Risk Assessment    Patient Name: [name]  DOB: 1955/12/12 MRN: [id number]      Request for Surgical Clearance    Procedure:  Insertion of inflatable penile prosthesis  Date of Surgery:  Clearance 03/17/24                                 Surgeon: Dr. Lovie Socks Group or Practice Name: [facility] Phone number: [phone] Fax number: [phone]   Type of Clearance Requested:   - Medical  - Pharmacy:  Hold Aspirin  5 days    Type of Anesthesia:  General    Additional requests/questions:    Bonney Willie Daring   02/06/2024, 4:05 PM

## 2024-02-06 NOTE — Telephone Encounter (Signed)
 Patient is followed by Erna Creighton MD with [facility]. Please have surgeon send to his practice.  Thank you, Lamarr

## 2024-02-10 NOTE — Telephone Encounter (Signed)
 Per preop APP Lamarr Satterfield, DNP pt is followed by another cardiology practice.

## 2024-03-09 NOTE — Patient Instructions (Addendum)
 SURGICAL WAITING ROOM VISITATION Patients having surgery or a procedure may have no more than 2 support people in the waiting area - these visitors may rotate in the visitor waiting room.   Due to an increase in RSV and influenza rates and associated hospitalizations, children ages 61 and under may not visit patients in [facility] hospitals. If the patient needs to stay at the hospital during part of their recovery, the visitor guidelines for inpatient rooms apply.  PRE-OP VISITATION  Pre-op nurse will coordinate an appropriate time for 1 support person to accompany the patient in pre-op.  This support person may not rotate.  This visitor will be contacted when the time is appropriate for the visitor to come back in the pre-op area.  Please refer to the [facility] website for the visitor guidelines for Inpatients (after your surgery is over and you are in a regular room).  You are not required to quarantine at this time prior to your surgery. However, you must do this: Hand Hygiene often Do NOT share personal items Notify your provider if you are in close contact with someone who has COVID or you develop fever 100.4 or greater, new onset of sneezing, cough, sore throat, shortness of breath or body aches.  If you test positive for Covid or have been in contact with anyone that has tested positive in the last 10 days please notify you surgeon.    Your procedure is scheduled on:  03/17/24  Report to [facility] Main Entrance: [place] entrance where the [employer] is available.   Report to admitting at: 5:15 AM  Call this number if you have any questions or problems the morning of surgery [phone]  FOLLOW ANY ADDITIONAL PRE OP INSTRUCTIONS YOU RECEIVED FROM YOUR SURGEON'S OFFICE!!!  Do not eat food or drink fluids after Midnight the night prior to your surgery/procedure.   Oral Hygiene is also important to reduce your risk of infection.        Remember - BRUSH YOUR TEETH  THE MORNING OF SURGERY WITH YOUR REGULAR TOOTHPASTE  Do NOT smoke after Midnight the night before surgery.  STOP TAKING all Vitamins, Herbs and supplements 1 week before your surgery.   Take ONLY these medicines the morning of surgery with A SIP OF WATER: isosorbide ,hydroxyzine ,metoprolol ,dexilant,levetiracetam,scemblix,darifenacin ,gemtesa,oxybutynin.  DO NOT TAKE THE FOLLOWING 7 DAYS PRIOR TO SURGERY: Ozempic, Wegovy, Rybelsus (Semaglutide), Byetta (exenatide), Bydureon (exenatide ER), Victoza, Saxenda (liraglutide), or Trulicity (dulaglutide) Mounjaro (Tirzepatide) Adlyxin (Lixisenatide), Polyethylene Glycol Loxenatide.HOLD ozempic after: 03/09/24  If You have been diagnosed with Sleep Apnea - Bring CPAP mask and tubing day of surgery. We will provide you with a CPAP machine on the day of your surgery.                   You may not have any metal on your body including hair pins, jewelry, and body piercing  Do not wear lotions, powders, perfumes / cologne, or deodorant   Men may shave face and neck.  Contacts, Hearing Aids, dentures or bridgework may not be worn into surgery. DENTURES WILL BE REMOVED PRIOR TO SURGERY PLEASE DO NOT APPLY Poly grip OR ADHESIVES!!!  You may bring a small overnight bag with you on the day of surgery, only pack items that are not valuable. [facility] IS NOT RESPONSIBLE   FOR VALUABLES THAT ARE LOST OR STOLEN.   Patients discharged on the day of surgery will not be allowed to drive home.  Someone NEEDS to  stay with you for the first 24 hours after anesthesia.  Do not bring your home medications to the hospital. The Pharmacy will dispense medications listed on your medication list to you during your admission in the Hospital.  Special Instructions: Bring a copy of your healthcare power of attorney and living will documents the day of surgery, if you wish to have them scanned into your [facility] Records- EPIC  Please read over the following fact  sheets you were given: IF YOU HAVE QUESTIONS ABOUT YOUR PRE-OP INSTRUCTIONS, PLEASE CALL [phone]   [facility] - Preparing for Surgery      Before surgery, you can play an important role.  Because skin is not sterile, your skin needs to be as free of germs as possible.  You can reduce the number of germs on your skin by washing with CHG (chlorahexidine gluconate) soap before surgery.  CHG is an antiseptic cleaner which kills germs and bonds with the skin to continue killing germs even after washing. Please DO NOT use if you have an allergy to CHG or antibacterial soaps.  If your skin becomes reddened/irritated stop using the CHG and inform your nurse when you arrive at Short Stay. Do not shave (including legs and underarms) for at least 48 hours prior to the first CHG shower.  You may shave your face/neck.  Please follow these instructions carefully:  1.  Shower with CHG Soap the night before surgery ONLY (DO NOT USE THE SOAP THE MORNING OF SURGERY).  2.  If you choose to wash your hair, wash your hair first as usual with your normal  shampoo.  3.  After you shampoo, rinse your hair and body thoroughly to remove the shampoo.                             4.  Use CHG as you would any other liquid soap.  You can apply chg directly to the skin and wash.  Gently with a scrungie or clean washcloth.  5.  Apply the CHG Soap to your body ONLY FROM THE NECK DOWN.   Do not use on face/ open                           Wound or open sores. Avoid contact with eyes, ears mouth and genitals (private parts).                       Wash face,  Genitals (private parts) with your normal soap.             6.  Wash thoroughly, paying special attention to the area where your  surgery  will be performed.  7.  Thoroughly rinse your body with warm water from the neck down.  8.  DO NOT shower/wash with your normal soap after using and rinsing off the CHG Soap.                9.  Pat yourself dry with a clean towel.             10.  Wear clean pajamas.            11.  Place clean sheets on your bed the night of your first shower and do not  sleep with pets.  Day of Surgery : Do not apply any CHG, lotions/deodorants the morning of surgery.  Please  wear clean clothes to the hospital/surgery center.   FAILURE TO FOLLOW THESE INSTRUCTIONS MAY RESULT IN THE CANCELLATION OF YOUR SURGERY  PATIENT SIGNATURE_________________________________  NURSE SIGNATURE__________________________________  ________________________________________________________________________

## 2024-03-10 ENCOUNTER — Encounter (HOSPITAL_COMMUNITY)
Admission: RE | Admit: 2024-03-10 | Discharge: 2024-03-10 | Disposition: A | Discharge status: Home / Self Care | Source: Ambulatory Visit | Attending: Urology | Admitting: Urology

## 2024-03-10 ENCOUNTER — Other Ambulatory Visit: Payer: Self-pay

## 2024-03-10 ENCOUNTER — Encounter (HOSPITAL_COMMUNITY): Payer: Self-pay

## 2024-03-10 VITALS — BP 115/91 | HR 88 | Temp 98.5°F | Ht 68.0 in | Wt 172.0 lb

## 2024-03-10 DIAGNOSIS — E118 Type 2 diabetes mellitus with unspecified complications: Secondary | ICD-10-CM | POA: Diagnosis not present

## 2024-03-10 DIAGNOSIS — Z87891 Personal history of nicotine dependence: Secondary | ICD-10-CM | POA: Diagnosis not present

## 2024-03-10 DIAGNOSIS — I110 Hypertensive heart disease with heart failure: Secondary | ICD-10-CM | POA: Diagnosis not present

## 2024-03-10 DIAGNOSIS — I2510 Atherosclerotic heart disease of native coronary artery without angina pectoris: Secondary | ICD-10-CM | POA: Insufficient documentation

## 2024-03-10 DIAGNOSIS — Z8546 Personal history of malignant neoplasm of prostate: Secondary | ICD-10-CM | POA: Diagnosis not present

## 2024-03-10 DIAGNOSIS — Z955 Presence of coronary angioplasty implant and graft: Secondary | ICD-10-CM | POA: Insufficient documentation

## 2024-03-10 DIAGNOSIS — I771 Stricture of artery: Secondary | ICD-10-CM | POA: Diagnosis not present

## 2024-03-10 DIAGNOSIS — I509 Heart failure, unspecified: Secondary | ICD-10-CM | POA: Diagnosis not present

## 2024-03-10 DIAGNOSIS — E119 Type 2 diabetes mellitus without complications: Secondary | ICD-10-CM | POA: Insufficient documentation

## 2024-03-10 DIAGNOSIS — N5201 Erectile dysfunction due to arterial insufficiency: Secondary | ICD-10-CM | POA: Diagnosis not present

## 2024-03-10 DIAGNOSIS — Z856 Personal history of leukemia: Secondary | ICD-10-CM | POA: Insufficient documentation

## 2024-03-10 DIAGNOSIS — G4733 Obstructive sleep apnea (adult) (pediatric): Secondary | ICD-10-CM | POA: Diagnosis not present

## 2024-03-10 DIAGNOSIS — I252 Old myocardial infarction: Secondary | ICD-10-CM | POA: Diagnosis not present

## 2024-03-10 DIAGNOSIS — Z01812 Encounter for preprocedural laboratory examination: Secondary | ICD-10-CM | POA: Insufficient documentation

## 2024-03-10 DIAGNOSIS — I10 Essential (primary) hypertension: Secondary | ICD-10-CM

## 2024-03-10 HISTORY — DX: Type 2 diabetes mellitus without complications: E11.9

## 2024-03-10 HISTORY — DX: Unspecified convulsions: R56.9

## 2024-03-10 LAB — HEMOGLOBIN A1C
Hgb A1c MFr Bld: 6 % — ABNORMAL HIGH (ref 4.8–5.6)
Mean Plasma Glucose: 125.5 mg/dL

## 2024-03-10 LAB — BASIC METABOLIC PANEL WITH GFR
Anion gap: 10 (ref 5–15)
BUN: 9 mg/dL (ref 8–23)
CO2: 26 mmol/L (ref 22–32)
Calcium: 9.8 mg/dL (ref 8.9–10.3)
Chloride: 104 mmol/L (ref 98–111)
Creatinine, Ser: 1.04 mg/dL (ref 0.61–1.24)
GFR, Estimated: >60 mL/min
Glucose, Bld: 128 mg/dL — ABNORMAL HIGH (ref 70–99)
Potassium: 3.9 mmol/L (ref 3.5–5.1)
Sodium: 140 mmol/L (ref 135–145)

## 2024-03-10 LAB — CBC
HCT: 43.9 % (ref 39.0–52.0)
Hemoglobin: 14.7 g/dL (ref 13.0–17.0)
MCH: 31.9 pg (ref 26.0–34.0)
MCHC: 33.5 g/dL (ref 30.0–36.0)
MCV: 95.2 fL (ref 80.0–100.0)
Platelets: 276 K/uL (ref 150–400)
RBC: 4.61 MIL/uL (ref 4.22–5.81)
RDW: 13.3 % (ref 11.5–15.5)
WBC: 5.9 K/uL (ref 4.0–10.5)
nRBC: 0 % (ref 0.0–0.2)

## 2024-03-10 LAB — GLUCOSE, CAPILLARY: Glucose-Capillary: 139 mg/dL — ABNORMAL HIGH (ref 70–99)

## 2024-03-10 NOTE — Progress Notes (Addendum)
 For Anesthesia: PCP - Tammy Tari DASEN, PA-C  Cardiologist - Erna Creighton, MD LOV: 12/26/23  Bowel Prep reminder:  Chest x-ray -  EKG - 12/26/23 Stress Test - 07/22/17 ECHO - 05/24/22 Cardiac Cath - 05/23/22 Pacemaker/ICD device last checked: Pacemaker orders received: Device Rep notified:  Spinal Cord Stimulator:N/A  Sleep Study -Yes  CPAP - NO  Fasting Blood Sugar - N/A Checks Blood Sugar __0___ times a day Date and result of last Hgb A1c-6.4: 02/20/24  Last dose of GLP1 agonist- Ozempic : no longer taking it. GLP1 instructions: Hold 7 days prior to schedule (Hold 24 hours-daily)   Last dose of SGLT-2 inhibitors- N/A SGLT-2 instructions: Hold 72 hours prior to surgery  Blood Thinner Instructions:N/A Last Dose: Time last taken:  Aspirin  Instructions: Last Dose: Time last taken:  Activity level: Can go up a flight of stairs and activities of daily living without stopping and without chest pain and/or shortness of breath   Able to exercise without chest pain and/or shortness of breath  Anesthesia review: Hx: CAD,HTN,DIA,MI,OSA,Epilepsy(03/2023;05/2023;07/2023)  Patient denies shortness of breath, fever, cough and chest pain at PAT appointment   Patient verbalized understanding of instructions that were reviewed over the telephone.

## 2024-03-11 NOTE — Telephone Encounter (Signed)
 [name] will be due for a refill of Scemblix on or about 1/14.  There are presently no refills remaining on the patient's current prescription. If appropriate, please proactively send a refill to the [facility].    Once the prescription is received insurance authorization and financial assistance will be confirmed.  The patient will be contacted and the medication arranged for delivery or pick-up in the patient's preferred manner. Please consider the time needed to dispense and deliver the medication in your response.  [facility] [phone]

## 2024-03-12 ENCOUNTER — Encounter (HOSPITAL_COMMUNITY): Payer: Self-pay

## 2024-03-12 LAB — URINE CULTURE: Culture: NO GROWTH

## 2024-03-12 NOTE — Anesthesia Preprocedure Evaluation (Signed)
"                                    Anesthesia Evaluation  Patient identified by MRN, date of birth, ID band Patient awake    Reviewed: Allergy & Precautions, NPO status , Patient's Chart, lab work & pertinent test results  Airway Mallampati: II  TM Distance: >3 FB Neck ROM: Full    Dental  (+) Teeth Intact, Dental Advisory Given, Implants,    Pulmonary former smoker   Pulmonary exam normal breath sounds clear to auscultation       Cardiovascular hypertension, Pt. on medications and Pt. on home beta blockers + CAD, + Past MI and + Cardiac Stents  Normal cardiovascular exam Rhythm:Regular Rate:Normal     Neuro/Psych Seizures -, Well Controlled,   negative psych ROS   GI/Hepatic Neg liver ROS,GERD  Medicated and Controlled,,  Endo/Other  diabetes, Type 2    Renal/GU Renal InsufficiencyRenal disease   Prostate cancer  ERECTILE DYSFUNCTION    Musculoskeletal  (+) Arthritis ,    Abdominal   Peds  Hematology negative hematology ROS (+)   Anesthesia Other Findings   Reproductive/Obstetrics                              Anesthesia Physical Anesthesia Plan  ASA: 3  Anesthesia Plan: General   Post-op Pain Management: Tylenol  PO (pre-op)*   Induction: Intravenous  PONV Risk Score and Plan: 3 and Midazolam , Dexamethasone  and Ondansetron   Airway Management Planned: LMA  Additional Equipment:   Intra-op Plan:   Post-operative Plan: Extubation in OR  Informed Consent: I have reviewed the patients History and Physical, chart, labs and discussed the procedure including the risks, benefits and alternatives for the proposed anesthesia with the patient or authorized representative who has indicated his/her understanding and acceptance.     Dental advisory given  Plan Discussed with: CRNA  Anesthesia Plan Comments: (See PAT note from 1/6)         Anesthesia Quick Evaluation  "

## 2024-03-12 NOTE — Progress Notes (Signed)
 " Case: [id number] Date/Time: 03/17/24 0715   Procedure: INSERTION, PENILE PROSTHESIS   Anesthesia type: General   Diagnosis: Erectile dysfunction due to arterial insufficiency [N52.01]   Pre-op diagnosis: ERECTILE DYSFUNCTION   Location: [facility] ROOM 06 / [facility]   Surgeons: Charles Arlyss CROME, MD       DISCUSSION: [name] is a 69 yo male with PMH of former smoking, HTN, history of multiple MIs (last was 05/2022), CAD s/p stents, and s/p PTCA in 05/2022 for instent restenosis, CHF, carotid artery disease, OSA (with CPAP intolerance), epilepsy, GERD, diabetes (A1c 6.0), CML, prostate cancer, arthritis.  Patient with history of multiple MIs and CAD followed by cardiology at [facility]. Per Dr. Arne: status post MI in 2002, treated with BMS stenting to ramus followed by overlapping stent with DES to Ramus in 2004, DES to RCA 2006 with evidence of old nonviable lateral infarct on echocardiogram with EF 45 to 50% and no ischemia on July 2021 stress echo - Now s/p NSTEMI of LCFx stent restenosis 05/24/22. Completed asa brilenta thru 05/24/23.[name] Last seen on 12/26/23 for follow up. Noted to be doing well. Echo in 11/2023 showed EF 40-45%. Advised continue GDMT. He was advised to f/u in 6 months.  Seen at [facility] for URI on 12/13. Seen by PCP at [facility] subsequently on 12/18 and stable at that visit.  Follows with Neurology at [facility] for epilepsy. Last seen on 03/03/24. He takes Keppra BID. Last seizure was 07/2023.   At PAT visit patient reports he can go up a flight of stairs without CP/SOB. Anticipate he can proceed.  LD Ozempic: Pt self d/c'd >1 week ago  VS: BP (!) 115/91   Pulse 88   Temp 36.9 C (Oral)   Ht 5' 8 (1.727 m)   Wt 78 kg   SpO2 98%   BMI 26.15 kg/m   PROVIDERS: Charles Tari DASEN, PA-C   LABS: Labs reviewed: Acceptable for surgery. (all labs ordered are listed, but only abnormal results are displayed)  Labs Reviewed  BASIC METABOLIC PANEL WITH GFR - Abnormal; Notable for the  following components:      Result Value   Glucose, Bld 128 (*)    All other components within normal limits  HEMOGLOBIN A1C - Abnormal; Notable for the following components:   Hgb A1c MFr Bld 6.0 (*)    All other components within normal limits  GLUCOSE, CAPILLARY - Abnormal; Notable for the following components:   Glucose-Capillary 139 (*)    All other components within normal limits  CBC     EKG 12/26/23 ( [facility] - report only):  Sinus rhythm with Premature atrial complexes T wave abnormality, consider lateral ischemia  When compared with ECG of 26-Dec-2023 09 41, No significant change was found  Echo 11/05/23 ([facility]):  SUMMARY The left ventricular size is normal. There is mild concentric left ventricular hypertrophy with inferior, inferolateral and anterolateral hypokinesis consistent with prior MI. Left ventricular systolic function is mildly reduced. LV ejection fraction = 40-45%. Reduced LV Global L Strain =-13.4%. The left ventricular diastolic function is probably normal for age, with likely normal left atrial pressure. The right ventricle is normal in size and function. The atria are normal in size. There is no significant valvular stenosis or regurgitation. No pulmonary hypertension. Estimated right ventricular systolic pressure is 18 mmHg. Normal IVC size and collapsibility; Right atrial pressure is estimated to be 3 mm Hg. There is no significant change in comparison with the last study.  LHC 05/23/22:  Prox RCA lesion is 50% stenosed.  Tortuous portion of the vessel which has progressed since 2013.   Mid LAD lesion is 25% stenosed.   2nd Diag lesion is 25% stenosed.   Prox LAD lesion is 50% stenosed.  RFR 0.95.  This area has progressed since 2013.   Ost Cx to Prox Cx lesion is 90% stenosed.   Scoring balloon angioplasty was performed using a BALLN WOLVERINE 2.50X10.   Post intervention, there is a 10% residual stenosis.   The left ventricular systolic  function is normal.   LV end diastolic pressure is normal.   The left ventricular ejection fraction is 55-65% by visual estimate.   There is no aortic valve stenosis.   Tortuosity noted in the right subclavian.  Somewhat difficult to torque catheters without the benefit of a destination sheath which was not available today.   Successful PTCA of the severe in-stent restenosis of the proximal circumflex stent.  Continue with dual antiplatelet therapy for 12 months.  Continue aggressive secondary prevention.  Results conveyed to the patient's son,  [name] [phone].  Plan for discharge tomorrow.  Past Medical History:  Diagnosis Date   Arthritis    Bladder disorder    Cancer (HCC)    hx prostate cancer and CML   Chronic myelocytic leukemia (HCC)    Coronary artery disease    with prior MI 2002(BMS) and Cfx(2006) and RCA (2004) stents . EF 45-50%   Coronary atherosclerosis of native coronary artery    stable and suspect a component of CAS   Diabetes mellitus without complication (HCC)    Diverticulosis    seen on colonoscopy in 2008   Goals of care, counseling/discussion 09/13/2016   H/O colonoscopy 2014   Hyperlipidemia    Hypertension    Myocardial infarction (HCC) 02,6/12   Prostate cancer (HCC)    Seizure (HCC)     Past Surgical History:  Procedure Laterality Date   CARDIAC CATHETERIZATION     normal EF, patent stents without obstructive disease (on Plavix  X 1 month only)   CAROTID STENT     CORONARY BALLOON ANGIOPLASTY N/A 05/23/2022   Procedure: CORONARY BALLOON ANGIOPLASTY;  Surgeon: Dann Candyce RAMAN, MD;  Location: [facility] INVASIVE CV LAB;  Service: Cardiovascular;  Laterality: N/A;   CORONARY PRESSURE/FFR STUDY N/A 05/23/2022   Procedure: INTRAVASCULAR PRESSURE WIRE/FFR STUDY;  Surgeon: Dann Candyce RAMAN, MD;  Location: [facility] INVASIVE CV LAB;  Service: Cardiovascular;  Laterality: N/A;   FINGER TENDON REPAIR Right    FRACTURE SURGERY     rt wrist   LEFT HEART CATH AND  CORONARY ANGIOGRAPHY N/A 05/23/2022   Procedure: LEFT HEART CATH AND CORONARY ANGIOGRAPHY;  Surgeon: Dann Candyce RAMAN, MD;  [facility] INVASIVE CV LAB;  Service: Cardiovascular;  Laterality: N/A;   LEFT HEART CATHETERIZATION WITH CORONARY ANGIOGRAM N/A 02/04/2012   Procedure: LEFT HEART CATHETERIZATION WITH CORONARY ANGIOGRAM;  Surgeon: Victory LELON Claudene DOUGLAS, MD;  Location: [facility] CATH LAB;  Service: Cardiovascular;  Laterality: N/A;   LIGAMENT REPAIR Right    arm   ORIF ANKLE FRACTURE  07/04/2011   Procedure: OPEN REDUCTION INTERNAL FIXATION (ORIF) ANKLE FRACTURE;  Surgeon: Norleen LITTIE Gavel, MD;  Location: [facility];  Service: Orthopedics;  Laterality: Right;   TOE SURGERY     rt great toe   TONSILLECTOMY     WRIST SURGERY     rt and lt cysts removed    MEDICATIONS:  acetaminophen  (TYLENOL ) 650 MG CR tablet   Ascorbic  Acid (VITAMIN C) 1000 MG tablet   aspirin  81 MG chewable tablet   azithromycin (ZITHROMAX) 250 MG tablet   benzonatate  (TESSALON ) 200 MG capsule   cholecalciferol (VITAMIN D) 1000 UNITS tablet   Coenzyme Q10 (COQ-10) 200 MG CAPS   darifenacin  (ENABLEX ) 15 MG 24 hr tablet   DEXILANT 60 MG capsule   doxycycline (VIBRAMYCIN) 100 MG capsule   ezetimibe  (ZETIA ) 10 MG tablet   GEMTESA 75 MG TABS   hydrALAZINE  (APRESOLINE ) 25 MG tablet   hydrOXYzine  (ATARAX /VISTARIL ) 25 MG tablet   isosorbide  mononitrate (IMDUR ) 60 MG 24 hr tablet   levETIRAcetam (KEPPRA) 500 MG tablet   metoprolol  succinate (TOPROL -XL) 50 MG 24 hr tablet   MYRBETRIQ  50 MG TB24 tablet   naproxen (NAPROSYN) 500 MG tablet   nitroGLYCERIN  (NITROSTAT ) 0.4 MG SL tablet   olmesartan  (BENICAR ) 20 MG tablet   oxybutynin (DITROPAN-XL) 5 MG 24 hr tablet   pravastatin (PRAVACHOL) 40 MG tablet   REPATHA SURECLICK 140 MG/ML SOAJ   SCEMBLIX 40 MG tablet   triamcinolone  cream (KENALOG ) 0.1 %   No current facility-administered medications for this encounter.   Burnard CHRISTELLA Odis DEVONNA [facility] Surgical Short  Stay/Anesthesiology [facility] Phone [phone] 03/12/2024 9:52 AM       "

## 2024-03-16 MED ORDER — GENTAMICIN SULFATE 40 MG/ML IJ SOLN
5.0000 mg/kg | INTRAVENOUS | Status: AC
  Administered 2024-03-17: 361.2 mg via INTRAVENOUS
  Filled 2024-03-16: qty 9

## 2024-03-17 ENCOUNTER — Ambulatory Visit (HOSPITAL_COMMUNITY): Payer: Self-pay | Admitting: Anesthesiology

## 2024-03-17 ENCOUNTER — Ambulatory Visit (HOSPITAL_COMMUNITY)
Admission: RE | Admit: 2024-03-17 | Discharge: 2024-03-17 | Disposition: A | Discharge status: Home / Self Care | Attending: Urology | Admitting: Urology

## 2024-03-17 ENCOUNTER — Encounter (HOSPITAL_COMMUNITY)
Admission: RE | Disposition: A | Discharge status: Home / Self Care | Payer: Self-pay | Source: Home / Self Care | Attending: Urology

## 2024-03-17 ENCOUNTER — Encounter (HOSPITAL_COMMUNITY): Payer: Self-pay | Admitting: Urology

## 2024-03-17 ENCOUNTER — Other Ambulatory Visit: Payer: Self-pay

## 2024-03-17 ENCOUNTER — Encounter (HOSPITAL_COMMUNITY): Payer: Self-pay | Admitting: Medical

## 2024-03-17 DIAGNOSIS — Z87891 Personal history of nicotine dependence: Secondary | ICD-10-CM | POA: Insufficient documentation

## 2024-03-17 DIAGNOSIS — Z960 Presence of urogenital implants: Secondary | ICD-10-CM

## 2024-03-17 DIAGNOSIS — Z955 Presence of coronary angioplasty implant and graft: Secondary | ICD-10-CM | POA: Diagnosis not present

## 2024-03-17 DIAGNOSIS — I10 Essential (primary) hypertension: Secondary | ICD-10-CM

## 2024-03-17 DIAGNOSIS — M1990 Unspecified osteoarthritis, unspecified site: Secondary | ICD-10-CM | POA: Diagnosis not present

## 2024-03-17 DIAGNOSIS — I2510 Atherosclerotic heart disease of native coronary artery without angina pectoris: Secondary | ICD-10-CM | POA: Insufficient documentation

## 2024-03-17 DIAGNOSIS — E119 Type 2 diabetes mellitus without complications: Secondary | ICD-10-CM | POA: Diagnosis not present

## 2024-03-17 DIAGNOSIS — N529 Male erectile dysfunction, unspecified: Secondary | ICD-10-CM | POA: Diagnosis not present

## 2024-03-17 DIAGNOSIS — K219 Gastro-esophageal reflux disease without esophagitis: Secondary | ICD-10-CM | POA: Diagnosis not present

## 2024-03-17 DIAGNOSIS — I252 Old myocardial infarction: Secondary | ICD-10-CM | POA: Insufficient documentation

## 2024-03-17 DIAGNOSIS — G40909 Epilepsy, unspecified, not intractable, without status epilepticus: Secondary | ICD-10-CM | POA: Diagnosis not present

## 2024-03-17 DIAGNOSIS — Z8546 Personal history of malignant neoplasm of prostate: Secondary | ICD-10-CM | POA: Diagnosis not present

## 2024-03-17 DIAGNOSIS — N5201 Erectile dysfunction due to arterial insufficiency: Secondary | ICD-10-CM | POA: Diagnosis present

## 2024-03-17 HISTORY — PX: PENILE PROSTHESIS IMPLANT: SHX240

## 2024-03-17 LAB — GLUCOSE, CAPILLARY
Glucose-Capillary: 115 mg/dL — ABNORMAL HIGH (ref 70–99)
Glucose-Capillary: 121 mg/dL — ABNORMAL HIGH (ref 70–99)

## 2024-03-17 MED ORDER — MIDAZOLAM HCL 2 MG/2ML IJ SOLN
INTRAMUSCULAR | Status: AC
  Filled 2024-03-17: qty 2

## 2024-03-17 MED ORDER — PROPOFOL 10 MG/ML IV BOLUS
INTRAVENOUS | Status: DC | PRN
  Administered 2024-03-17: 40 mg via INTRAVENOUS
  Administered 2024-03-17: 10 mg via INTRAVENOUS
  Administered 2024-03-17: 20 mg via INTRAVENOUS
  Administered 2024-03-17: 120 mg via INTRAVENOUS
  Administered 2024-03-17: 10 mg via INTRAVENOUS

## 2024-03-17 MED ORDER — ROCURONIUM BROMIDE 100 MG/10ML IV SOLN
INTRAVENOUS | Status: DC | PRN
  Administered 2024-03-17: 50 mg via INTRAVENOUS

## 2024-03-17 MED ORDER — ACETAMINOPHEN 500 MG PO TABS
1000.0000 mg | ORAL_TABLET | Freq: Once | ORAL | Status: AC
  Administered 2024-03-17: 1000 mg via ORAL
  Filled 2024-03-17: qty 2

## 2024-03-17 MED ORDER — SUGAMMADEX SODIUM 200 MG/2ML IV SOLN
INTRAVENOUS | Status: DC | PRN
  Administered 2024-03-17: 200 mg via INTRAVENOUS

## 2024-03-17 MED ORDER — ONDANSETRON HCL 4 MG/2ML IJ SOLN: 4.0000 mg | Freq: Once | INTRAMUSCULAR | Status: DC | PRN

## 2024-03-17 MED ORDER — BUPIVACAINE HCL 0.5 % IJ SOLN
INTRAMUSCULAR | Status: DC | PRN
  Administered 2024-03-17: 17 mL

## 2024-03-17 MED ORDER — ORAL CARE MOUTH RINSE: 15.0000 mL | Freq: Once | OROMUCOSAL | Status: AC

## 2024-03-17 MED ORDER — FENTANYL CITRATE (PF) 100 MCG/2ML IJ SOLN
INTRAMUSCULAR | Status: DC | PRN
  Administered 2024-03-17 (×2): 50 ug via INTRAVENOUS

## 2024-03-17 MED ORDER — IRRISEPT - 450ML BOTTLE WITH 0.05% CHG IN STERILE WATER, USP 99.95% OPTIME
TOPICAL | Status: DC | PRN
  Administered 2024-03-17 (×2): 450 mL

## 2024-03-17 MED ORDER — DEXAMETHASONE SOD PHOSPHATE PF 10 MG/ML IJ SOLN
INTRAMUSCULAR | Status: AC
  Filled 2024-03-17: qty 1

## 2024-03-17 MED ORDER — OXYCODONE HCL 5 MG PO TABS: 5.0000 mg | ORAL_TABLET | Freq: Four times a day (QID) | ORAL | 0 refills | Status: AC | PRN

## 2024-03-17 MED ORDER — FENTANYL CITRATE (PF) 100 MCG/2ML IJ SOLN
INTRAMUSCULAR | Status: AC
  Filled 2024-03-17: qty 2

## 2024-03-17 MED ORDER — SODIUM CHLORIDE 0.9 % IR SOLN
Status: DC | PRN
  Administered 2024-03-17: 1000 mL

## 2024-03-17 MED ORDER — MUPIROCIN 2 % EX OINT: 1.0000 | TOPICAL_OINTMENT | Freq: Once | CUTANEOUS | Status: DC

## 2024-03-17 MED ORDER — LIDOCAINE HCL (CARDIAC) PF 100 MG/5ML IV SOSY
PREFILLED_SYRINGE | INTRAVENOUS | Status: DC | PRN
  Administered 2024-03-17: 80 mg via INTRAVENOUS

## 2024-03-17 MED ORDER — DEXAMETHASONE SOD PHOSPHATE PF 10 MG/ML IJ SOLN
INTRAMUSCULAR | Status: DC | PRN
  Administered 2024-03-17: 4 mg via INTRAVENOUS

## 2024-03-17 MED ORDER — SULFAMETHOXAZOLE-TRIMETHOPRIM 800-160 MG PO TABS: 1.0000 | ORAL_TABLET | Freq: Two times a day (BID) | ORAL | 0 refills | Status: AC

## 2024-03-17 MED ORDER — LACTATED RINGERS IV SOLN: INTRAVENOUS | Status: DC

## 2024-03-17 MED ORDER — FENTANYL CITRATE (PF) 50 MCG/ML IJ SOSY
25.0000 ug | PREFILLED_SYRINGE | INTRAMUSCULAR | Status: DC | PRN
  Administered 2024-03-17 (×4): 25 ug via INTRAVENOUS
  Administered 2024-03-17: 50 ug via INTRAVENOUS

## 2024-03-17 MED ORDER — KETAMINE HCL 10 MG/ML IJ SOLN
INTRAMUSCULAR | Status: DC | PRN
  Administered 2024-03-17: 30 mg via INTRAVENOUS
  Administered 2024-03-17: 10 mg via INTRAVENOUS

## 2024-03-17 MED ORDER — MIDAZOLAM HCL 5 MG/5ML IJ SOLN
INTRAMUSCULAR | Status: DC | PRN
  Administered 2024-03-17 (×2): 1 mg via INTRAVENOUS

## 2024-03-17 MED ORDER — ONDANSETRON HCL 4 MG/2ML IJ SOLN
INTRAMUSCULAR | Status: AC
  Filled 2024-03-17: qty 2

## 2024-03-17 MED ORDER — ACETAMINOPHEN 500 MG PO TABS: 1000.0000 mg | ORAL_TABLET | Freq: Four times a day (QID) | ORAL | 0 refills | Status: AC

## 2024-03-17 MED ORDER — PROPOFOL 10 MG/ML IV BOLUS
INTRAVENOUS | Status: AC
  Filled 2024-03-17: qty 20

## 2024-03-17 MED ORDER — ONDANSETRON HCL 4 MG/2ML IJ SOLN
INTRAMUSCULAR | Status: DC | PRN
  Administered 2024-03-17: 4 mg via INTRAVENOUS

## 2024-03-17 MED ORDER — SUGAMMADEX SODIUM 200 MG/2ML IV SOLN
INTRAVENOUS | Status: AC
  Filled 2024-03-17: qty 2

## 2024-03-17 MED ORDER — LIDOCAINE HCL (PF) 2 % IJ SOLN
INTRAMUSCULAR | Status: AC
  Filled 2024-03-17: qty 5

## 2024-03-17 MED ORDER — CHLORHEXIDINE GLUCONATE 0.12 % MT SOLN
15.0000 mL | Freq: Once | OROMUCOSAL | Status: AC
  Administered 2024-03-17: 15 mL via OROMUCOSAL

## 2024-03-17 MED ORDER — FLUCONAZOLE IN SODIUM CHLORIDE 200-0.9 MG/100ML-% IV SOLN
200.0000 mg | INTRAVENOUS | Status: DC
  Administered 2024-03-17: 200 mg via INTRAVENOUS
  Filled 2024-03-17: qty 100

## 2024-03-17 MED ORDER — PHENYLEPHRINE HCL-NACL 20-0.9 MG/250ML-% IV SOLN
INTRAVENOUS | Status: AC
  Filled 2024-03-17: qty 250

## 2024-03-17 MED ORDER — FENTANYL CITRATE (PF) 50 MCG/ML IJ SOSY
PREFILLED_SYRINGE | INTRAMUSCULAR | Status: AC
  Filled 2024-03-17: qty 2

## 2024-03-17 MED ORDER — LIDOCAINE HCL 1 % IJ SOLN
INTRAMUSCULAR | Status: DC | PRN
  Administered 2024-03-17: 17 mL

## 2024-03-17 MED ORDER — LIDOCAINE HCL (PF) 1 % IJ SOLN
INTRAMUSCULAR | Status: DC | PRN
  Administered 2024-03-17: 35 mL

## 2024-03-17 MED ORDER — VANCOMYCIN HCL 1500 MG/300ML IV SOLN
1500.0000 mg | INTRAVENOUS | Status: AC
  Administered 2024-03-17: 1500 mg via INTRAVENOUS
  Filled 2024-03-17: qty 300

## 2024-03-17 MED ORDER — STERILE WATER FOR IRRIGATION IR SOLN
Status: DC | PRN
  Administered 2024-03-17: 1000 mL

## 2024-03-17 MED ORDER — CHLORHEXIDINE GLUCONATE 4 % EX SOLN: Freq: Once | CUTANEOUS | Status: DC

## 2024-03-17 MED ORDER — FENTANYL CITRATE (PF) 50 MCG/ML IJ SOSY
PREFILLED_SYRINGE | INTRAMUSCULAR | Status: AC
  Filled 2024-03-17: qty 1

## 2024-03-17 MED ORDER — ROCURONIUM BROMIDE 10 MG/ML (PF) SYRINGE
PREFILLED_SYRINGE | INTRAVENOUS | Status: AC
  Filled 2024-03-17: qty 10

## 2024-03-17 MED ORDER — CELECOXIB 200 MG PO CAPS: 200.0000 mg | ORAL_CAPSULE | Freq: Two times a day (BID) | ORAL | 1 refills | Status: AC

## 2024-03-17 MED ORDER — DEXMEDETOMIDINE HCL IN NACL 80 MCG/20ML IV SOLN
INTRAVENOUS | Status: DC | PRN
  Administered 2024-03-17: 8 ug via INTRAVENOUS

## 2024-03-17 MED ORDER — KETAMINE HCL 50 MG/5ML IJ SOSY
PREFILLED_SYRINGE | INTRAMUSCULAR | Status: AC
  Filled 2024-03-17: qty 5

## 2024-03-17 MED ORDER — LIDOCAINE HCL (PF) 1 % IJ SOLN
INTRAMUSCULAR | Status: AC
  Filled 2024-03-17: qty 30

## 2024-03-17 MED ORDER — BUPIVACAINE HCL (PF) 0.5 % IJ SOLN
INTRAMUSCULAR | Status: AC
  Filled 2024-03-17: qty 30

## 2024-03-17 NOTE — Op Note (Signed)
 PATIENT:  [name]  PRE-OPERATIVE DIAGNOSIS:  Organic erectile dysfunction  POST-OPERATIVE DIAGNOSIS:  Same  PROCEDURE:   3 piece inflatable penile prosthesis (BS/AMS) Injection of pharmacoagent into penis  SURGEON:  Herlene Foot MD  ASST: Amon Bound, MD  INDICATION: He has had long-standing organic erectile dysfunction and refractory to other modes of treatment. He has elected to proceed with prosthesis implantation.  ANESTHESIA:  General  EBL:  50 cc  Device: 3 piece AMS CX 700: 99 cc reservoir, 24 cm cylinders and 0 cm rear-tip extenders on right and left sides  LOCAL MEDICATIONS USED:   penile block done with 10 cc lidocaine /marcaine  mixture 10 cc injected into corpora directly via butterfly needle  SPECIMEN: None  Description of procedure: The patient was taken to the major operating room, placed on the table and administered general anesthesia in the supine position. His genitalia was then prepped with chlorhexidine  x 2. He was draped in the usual sterile fashion, and I used Ioban on the field. An official timeout was then performed.  A dorsal penile block was performed. A butterfly needle was then used to inject normal saline into the penis to give an artificial erection. There was no clinically significant curvature or deformity. I then injected 10 cc of lidocaine /marcaine  into the penis.   A 14 French coude catheter was then placed in the bladder and the bladder was drained and the catheter was plugged. A midline penoscrotal incision was then made and the dissection was carried down to the corpora and urethra. The lonestar retractor was positioned so as to have excellent exposure. 2-0 Vicryl sutures were then placed proximally in each corpus cavernosum to serve as stay sutures. An incision was then made in the corpus cavernosum first on the left-hand side with the bovie. Tamra were used to gently dilate the opening. I then dilated the corpus cavernosum with the a  12 Fr brooks dilator distally and proximally. Field goal post tests were performed and there was no evidence of perforations or crossover. I then irrigated the corpus cavernosum with antibiotic solution and measured the distance proximally and distally from the stay suture and was found to be 9.5 and 14 cm, respectively.I then turned my attention to the contralateral corpus cavernosum and placed my stay sutures, made my corporotomy and dilated the corpus cavernosum in an identical fashion. This was measured and also was found to be 9.5 cm proximally and 14 distally. It was irrigated with anastomotic solution as was the scrotum. I then chose an 24 cm cylinder set with 0 cm rear-tip extenders and these were prepped while I prepared the site for reservoir placement.  I then digitally probed into the Left external inguinal ring. My finger was used to poke through the posterior wall of the ring. I used my finger to ensure I was in the appropriate space, and to clear room for the reservoir. I irrigated the space with anastomotic solution and then placed the reservoir in this location. I then filled the reservoir with 99 cc of sterile saline, and checked to confirm proper position. There was minimal backpressure with the reservoir max-filled.  Attention was redirected to the corporotomies where the cylinders were then placed by first fixing the suture to the distal aspect of the right cylinder to a straight needle. This was then loaded on the [facility] inserter and passed through the corporotomy and distally. I then advanced the straight needle with the Furlow inserter out through the glans and this was grasped  with a hemostat and pulled through the glans and the suture was secured with a hemostat. I then performed an identical maneuver on the contralateral side. After this was performed I irrigated both corpus cavernosum; there was no evidence of urethral perforation. I inserted the distal portion of the cylinder through  the corporotomies and pulled this to the end of the corpora with the suture. The proximal aspect with the rear-tip extender was then passed through the corporotomy and into the seated position on each side. I then connected reservoir tubing to a syringe filled with sterile saline and inflated the device. I noted a good straight erection with both cylinders equidistant under the glans and no buckling of the cylinders. I therefore deflated the device and closed the corporotomies with used my previously placed stay sutures.   I then grasped the scrotal skin in the midline with a babcock, and used a hemostat to dissect down to the dependent-most portion of the scrotum. The nasal speculum was inserted into this space, and facilitated placement of the pump. The cylinder was then connected to the pump after excising the excess tubing with appropriate shodded hemostats in place and then I used the supplied connectors to make the connection. I then again cycled the device with the pump and it cycled properly. I deflated the device and pumped it up about three quarters of the way to aid with hemostasis. I irrigated the wound one last time with antibiotic irrigation and then closed the deep scrotal tissue over the tubing and pump with running 3-0 monocryl suture. I placed a 10 Fr blake drain over the corporotomies. A second layer was then closed over this first layer with running 3- 0 monocryl, and running skin suture w/ 4-0 monocryl performed. Incision dressed with dermabond.  A mummy wrap was applied. The catheter was removed, and drain connected to suction bulb and the patient was awakened and taken recovery room in stable and satisfactory condition. He tolerated the procedure well and there were no intraoperative complications. Needle sponge and instrument counts were correct at the end of the operation.

## 2024-03-17 NOTE — H&P (Signed)
 H&P  History of Present Illness: [name] is a 69 y.o. year old M who presents today for insertion of an inflatable penile prosthesis  No acute complaints  Past Medical History:  Diagnosis Date   Arthritis    Bladder disorder    Cancer (HCC)    hx prostate cancer and CML   Chronic myelocytic leukemia (HCC)    Coronary artery disease    with prior MI 2002(BMS) and Cfx(2006) and RCA (2004) stents . EF 45-50%   Coronary atherosclerosis of native coronary artery    stable and suspect a component of CAS   Diabetes mellitus without complication (HCC)    Diverticulosis    seen on colonoscopy in 2008   Goals of care, counseling/discussion 09/13/2016   H/O colonoscopy 2014   Hyperlipidemia    Hypertension    Myocardial infarction (HCC) 02,6/12   Prostate cancer (HCC)    Seizure (HCC)     Past Surgical History:  Procedure Laterality Date   CARDIAC CATHETERIZATION     normal EF, patent stents without obstructive disease (on Plavix  X 1 month only)   CAROTID STENT     CORONARY BALLOON ANGIOPLASTY N/A 05/23/2022   Procedure: CORONARY BALLOON ANGIOPLASTY;  Surgeon: Dann Candyce RAMAN, MD;  Location: [facility] INVASIVE CV LAB;  Service: Cardiovascular;  Laterality: N/A;   CORONARY PRESSURE/FFR STUDY N/A 05/23/2022   Procedure: INTRAVASCULAR PRESSURE WIRE/FFR STUDY;  Surgeon: Dann Candyce RAMAN, MD;  Location: [facility] INVASIVE CV LAB;  Service: Cardiovascular;  Laterality: N/A;   FINGER TENDON REPAIR Right    FRACTURE SURGERY     rt wrist   LEFT HEART CATH AND CORONARY ANGIOGRAPHY N/A 05/23/2022   Procedure: LEFT HEART CATH AND CORONARY ANGIOGRAPHY;  Surgeon: Dann Candyce RAMAN, MD;  Location: [facility] INVASIVE CV LAB;  Service: Cardiovascular;  Laterality: N/A;   LEFT HEART CATHETERIZATION WITH CORONARY ANGIOGRAM N/A 02/04/2012   Procedure: LEFT HEART CATHETERIZATION WITH CORONARY ANGIOGRAM;  Surgeon: Victory LELON Claudene DOUGLAS, MD;  Location: [facility] CATH LAB;  Service: Cardiovascular;  Laterality: N/A;    LIGAMENT REPAIR Right    arm   ORIF ANKLE FRACTURE  07/04/2011   Procedure: OPEN REDUCTION INTERNAL FIXATION (ORIF) ANKLE FRACTURE;  Surgeon: Norleen LITTIE Gavel, MD;  Location: [facility];  Service: Orthopedics;  Laterality: Right;   TOE SURGERY     rt great toe   TONSILLECTOMY     WRIST SURGERY     rt and lt cysts removed    Home Medications:  Active Medications[1]  Allergies: Allergies[2]  Family History  Problem Relation Age of Onset   Cancer Mother        Liver    Social History:  reports that he quit smoking about 23 years ago. His smoking use included cigarettes. He has never used smokeless tobacco. He reports current alcohol use. He reports current drug use. Drug: Marijuana.  ROS: A complete review of systems was performed.  All systems are negative except for pertinent findings as noted.  Physical Exam:  Vital signs in last 24 hours: Temp:  [98 F (36.7 C)] 98 F (36.7 C) (01/13 0551) Pulse Rate:  [72] 72 (01/13 0551) Resp:  [16] 16 (01/13 0551) BP: (160-172)/(94-104) 160/104 (01/13 0557) SpO2:  [96 %] 96 % (01/13 0551) Weight:  [79.4 kg] 79.4 kg (01/13 0551) Constitutional:  Alert and oriented, No acute distress Cardiovascular: Regular rate and rhythm Respiratory: Normal respiratory effort, Lungs clear bilaterally GI: Abdomen is soft, nontender, nondistended, no abdominal masses  Lymphatic: No lymphadenopathy Neurologic: Grossly intact, no focal deficits Psychiatric: Normal mood and affect   Laboratory Data:  No results for input(s): WBC, HGB, HCT, PLT in the last 72 hours.  No results for input(s): NA, K, CL, GLUCOSE, BUN, CALCIUM , CREATININE in the last 72 hours.  Invalid input(s): CO3   Results for orders placed or performed during the hospital encounter of 03/17/24 (from the past 24 hours)  Glucose, capillary     Status: Abnormal   Collection Time: 03/17/24  5:55 AM  Result Value Ref Range   Glucose-Capillary  115 (H) 70 - 99 mg/dL   Comment 1 Notify RN    Comment 2 Document in Chart    Recent Results (from the past 240 hours)  Urine Culture     Status: None   Collection Time: 03/11/24  1:23 PM   Specimen: Urine, Clean Catch  Result Value Ref Range Status   Specimen Description   Final    URINE, CLEAN CATCH Performed at [facility], [address], [place], [place] [address]    Special Requests   Final    NONE Performed at [facility], [address], [place], [place] [address]    Culture   Final    NO GROWTH Performed at [facility], [address] [place], [place] [address]    Report Status 03/12/2024 FINAL  Final    Renal Function: Recent Labs    03/10/24 0856  CREATININE 1.04   Estimated Creatinine Clearance: 67.6 mL/min (by C-G formula based on SCr of 1.04 mg/dL).  Radiologic Imaging: No results found.  Assessment:  [name] is a 69 y.o. year old M with ED refractory to other medical treatments  Plan:  To OR as planned for IPP. Procedure and risks reviewed, including but not limited to bleeding, infection, implant infection, implant malfunction, implant malplacement, erosion, damage to adjacent structures, pain, urinary retention. All questions answered   Herlene Foot, MD 03/17/2024, 7:30 AM  [facility] Pager: [phone]      [1]  Current Meds  Medication Sig   acetaminophen  (TYLENOL ) 650 MG CR tablet Take 1,300 mg by mouth daily as needed for pain.   Ascorbic Acid (VITAMIN C) 1000 MG tablet Take 2,000 mg by mouth daily.   aspirin  81 MG chewable tablet Chew 1 tablet (81 mg total) by mouth daily.   cholecalciferol (VITAMIN D) 1000 UNITS tablet Take 3,000 Units by mouth daily.   Coenzyme Q10 (COQ-10) 200 MG CAPS Take 200 mg by mouth daily.   darifenacin  (ENABLEX ) 15 MG 24 hr tablet Take 15 mg by mouth daily.    DEXILANT 60 MG capsule TAKE 1 CAPSULE BY MOUTH ONCE DAILY   doxycycline  (VIBRAMYCIN) 100 MG capsule Take 100 mg by mouth 2 (two) times daily.   ezetimibe  (ZETIA ) 10 MG tablet Take 10 mg by mouth daily.   GEMTESA 75 MG TABS Take 1 tablet by mouth daily.   hydrOXYzine  (ATARAX /VISTARIL ) 25 MG tablet Take 1 tablet (25 mg total) by mouth 2 (two) times daily. (Patient taking differently: Take 50 mg by mouth daily.)   isosorbide  mononitrate (IMDUR ) 60 MG 24 hr tablet TAKE 1 TABLET BY MOUTH ONCE DAILY   levETIRAcetam (KEPPRA) 500 MG tablet Take 500 mg by mouth 2 (two) times daily.   metoprolol  succinate (TOPROL -XL) 50 MG 24 hr tablet Take 1.5 tablets (75 mg total) by mouth daily. Take with or immediately following a meal. (Patient taking  differently: Take 50 mg by mouth daily. Take with or immediately following a meal.)   naproxen (NAPROSYN) 500 MG tablet Take 500 mg by mouth daily as needed for mild pain (pain score 1-3) or moderate pain (pain score 4-6).   nitroGLYCERIN  (NITROSTAT ) 0.4 MG SL tablet Place 1 tablet (0.4 mg total) under the tongue every 5 (five) minutes as needed for chest pain.   olmesartan  (BENICAR ) 20 MG tablet Take 2 tablets (40 mg total) by mouth daily. (Patient taking differently: Take 20 mg by mouth daily.)   oxybutynin (DITROPAN-XL) 5 MG 24 hr tablet Take 1 tablet by mouth daily.   pravastatin (PRAVACHOL) 40 MG tablet Take 40 mg by mouth daily.   SCEMBLIX 40 MG tablet Take 40 mg by mouth 2 (two) times daily.   triamcinolone  cream (KENALOG ) 0.1 % Apply 1 Application topically daily as needed (rash).  [2]  Allergies Allergen Reactions   Contrast Media [Iodinated Contrast Media] Hives    a few hives; not a lot

## 2024-03-17 NOTE — Anesthesia Procedure Notes (Signed)
 Procedure Name: Intubation Date/Time: 03/17/2024 7:54 AM  Performed by: Kathern Rollene LABOR, CRNAPre-anesthesia Checklist: Patient identified, Emergency Drugs available, Suction available and Patient being monitored Patient Re-evaluated:Patient Re-evaluated prior to induction Oxygen Delivery Method: Circle system utilized Preoxygenation: Pre-oxygenation with 100% oxygen Induction Type: IV induction Ventilation: Mask ventilation without difficulty Laryngoscope Size: Mac and 4 Grade View: Grade II Tube type: Oral Tube size: 7.5 mm Number of attempts: 1 Airway Equipment and Method: Stylet and Oral airway Placement Confirmation: ETT inserted through vocal cords under direct vision, positive ETCO2 and breath sounds checked- equal and bilateral Secured at: 23 cm Tube secured with: Tape Dental Injury: Teeth and Oropharynx as per pre-operative assessment

## 2024-03-17 NOTE — Anesthesia Postprocedure Evaluation (Signed)
"   Anesthesia Post Note  Patient: [name]  Procedure(s) Performed: INSERTION, PENILE PROSTHESIS     Patient location during evaluation: PACU Anesthesia Type: General Level of consciousness: awake and alert Pain management: pain level controlled Vital Signs Assessment: post-procedure vital signs reviewed and stable Respiratory status: spontaneous breathing, nonlabored ventilation and respiratory function stable Cardiovascular status: blood pressure returned to baseline and stable Postop Assessment: no apparent nausea or vomiting Anesthetic complications: no   No notable events documented.  Last Vitals:  Vitals:   03/17/24 1100 03/17/24 1115  BP: (!) 151/91 (!) 143/92  Pulse: 66 60  Resp: 19 15  Temp:  36.8 C  SpO2: 93% 94%    Last Pain:  Vitals:   03/17/24 1115  TempSrc:   PainSc: 4                  Garnette FORBES Skillern      "

## 2024-03-17 NOTE — Discharge Instructions (Signed)

## 2024-03-17 NOTE — Transfer of Care (Signed)
 Immediate Anesthesia Transfer of Care Note  Patient: [name]  Procedure(s) Performed: INSERTION, PENILE PROSTHESIS  Patient Location: PACU  Anesthesia Type:General  Level of Consciousness: awake, alert , oriented, and patient cooperative  Airway & Oxygen Therapy: Patient Spontanous Breathing and Patient connected to face mask oxygen  Post-op Assessment: Report given to RN and Post -op Vital signs reviewed and stable  Post vital signs: Reviewed and stable  Last Vitals:  Vitals Value Taken Time  BP 156/108 03/17/24 09:52  Temp    Pulse 74 03/17/24 09:53  Resp 25 03/17/24 09:53  SpO2 100 % 03/17/24 09:53  Vitals shown include unfiled device data.  Last Pain:  Vitals:   03/17/24 0551  TempSrc: Oral  PainSc: 5          Complications: No notable events documented.

## 2024-03-18 ENCOUNTER — Encounter (HOSPITAL_COMMUNITY): Payer: Self-pay | Admitting: Urology

## 2024-03-24 ENCOUNTER — Ambulatory Visit: Admitting: Neurology
